# Patient Record
Sex: Female | Born: 1950 | Race: Black or African American | Hispanic: No | Marital: Single | Smoking: Former smoker
Health system: Southern US, Community
[De-identification: ages and names within clinical notes are randomized; demographics above are authoritative.]

## PROBLEM LIST (undated history)

## (undated) DIAGNOSIS — E119 Type 2 diabetes mellitus without complications: Secondary | ICD-10-CM

## (undated) DIAGNOSIS — Z972 Presence of dental prosthetic device (complete) (partial): Secondary | ICD-10-CM

## (undated) DIAGNOSIS — Z9989 Dependence on other enabling machines and devices: Principal | ICD-10-CM

## (undated) DIAGNOSIS — G8194 Hemiplegia, unspecified affecting left nondominant side: Secondary | ICD-10-CM

## (undated) DIAGNOSIS — I10 Essential (primary) hypertension: Secondary | ICD-10-CM

## (undated) DIAGNOSIS — R569 Unspecified convulsions: Secondary | ICD-10-CM

## (undated) DIAGNOSIS — I639 Cerebral infarction, unspecified: Secondary | ICD-10-CM

## (undated) DIAGNOSIS — E7800 Pure hypercholesterolemia, unspecified: Secondary | ICD-10-CM

## (undated) DIAGNOSIS — K219 Gastro-esophageal reflux disease without esophagitis: Secondary | ICD-10-CM

## (undated) DIAGNOSIS — G4733 Obstructive sleep apnea (adult) (pediatric): Secondary | ICD-10-CM

## (undated) DIAGNOSIS — Z993 Dependence on wheelchair: Secondary | ICD-10-CM

## (undated) DIAGNOSIS — R531 Weakness: Secondary | ICD-10-CM

## (undated) DIAGNOSIS — G629 Polyneuropathy, unspecified: Secondary | ICD-10-CM

## (undated) HISTORY — DX: Unspecified convulsions: R56.9

## (undated) HISTORY — DX: Weakness: R53.1

## (undated) HISTORY — DX: Pure hypercholesterolemia, unspecified: E78.00

## (undated) HISTORY — DX: Dependence on other enabling machines and devices: Z99.89

## (undated) HISTORY — DX: Obstructive sleep apnea (adult) (pediatric): G47.33

---

## 2005-01-24 ENCOUNTER — Ambulatory Visit: Payer: Self-pay

## 2012-08-12 DIAGNOSIS — I639 Cerebral infarction, unspecified: Secondary | ICD-10-CM

## 2012-08-12 HISTORY — DX: Cerebral infarction, unspecified: I63.9

## 2012-09-14 ENCOUNTER — Emergency Department: Payer: Self-pay | Admitting: Unknown Physician Specialty

## 2012-09-14 LAB — CK TOTAL AND CKMB (NOT AT ARMC)
CK, Total: 197 U/L (ref 21–215)
CK-MB: 1.6 ng/mL (ref 0.5–3.6)

## 2012-09-14 LAB — MAGNESIUM: Magnesium: 1.9 mg/dL

## 2012-09-14 LAB — COMPREHENSIVE METABOLIC PANEL
Albumin: 3.7 g/dL (ref 3.4–5.0)
Anion Gap: 7 (ref 7–16)
Bilirubin,Total: 0.3 mg/dL (ref 0.2–1.0)
Chloride: 105 mmol/L (ref 98–107)
Co2: 26 mmol/L (ref 21–32)
Creatinine: 1 mg/dL (ref 0.60–1.30)
EGFR (Non-African Amer.): >60
Osmolality: 279 (ref 275–301)
SGPT (ALT): 26 U/L (ref 12–78)
Sodium: 138 mmol/L (ref 136–145)
Total Protein: 6.8 g/dL (ref 6.4–8.2)

## 2012-09-14 LAB — CBC
HGB: 13.5 g/dL (ref 12.0–16.0)
MCV: 105 fL — ABNORMAL HIGH (ref 80–100)
Platelet: 160 10*3/uL (ref 150–440)

## 2012-09-14 LAB — TSH: Thyroid Stimulating Horm: 3.5 u[IU]/mL

## 2012-09-14 LAB — APTT: Activated PTT: 23 secs — ABNORMAL LOW (ref 23.6–35.9)

## 2012-09-15 ENCOUNTER — Inpatient Hospital Stay (HOSPITAL_COMMUNITY): Payer: BC Managed Care – PPO

## 2012-09-15 ENCOUNTER — Encounter (HOSPITAL_COMMUNITY): Payer: Self-pay | Admitting: *Deleted

## 2012-09-15 ENCOUNTER — Inpatient Hospital Stay (HOSPITAL_COMMUNITY)
Admission: AD | Admit: 2012-09-15 | Discharge: 2012-09-22 | DRG: 533 | Disposition: A | Discharge status: Rehabilitation Facility | Payer: BC Managed Care – PPO | Source: Other Acute Inpatient Hospital | Attending: Neurology | Admitting: Neurology

## 2012-09-15 DIAGNOSIS — R4701 Aphasia: Secondary | ICD-10-CM | POA: Diagnosis present

## 2012-09-15 DIAGNOSIS — K449 Diaphragmatic hernia without obstruction or gangrene: Secondary | ICD-10-CM | POA: Diagnosis present

## 2012-09-15 DIAGNOSIS — I10 Essential (primary) hypertension: Secondary | ICD-10-CM | POA: Diagnosis present

## 2012-09-15 DIAGNOSIS — G936 Cerebral edema: Secondary | ICD-10-CM

## 2012-09-15 DIAGNOSIS — Z9282 Status post administration of tPA (rtPA) in a different facility within the last 24 hours prior to admission to current facility: Secondary | ICD-10-CM

## 2012-09-15 DIAGNOSIS — I6340 Cerebral infarction due to embolism of unspecified cerebral artery: Principal | ICD-10-CM | POA: Diagnosis present

## 2012-09-15 DIAGNOSIS — I619 Nontraumatic intracerebral hemorrhage, unspecified: Secondary | ICD-10-CM | POA: Diagnosis present

## 2012-09-15 DIAGNOSIS — G8190 Hemiplegia, unspecified affecting unspecified side: Secondary | ICD-10-CM | POA: Diagnosis present

## 2012-09-15 DIAGNOSIS — R569 Unspecified convulsions: Secondary | ICD-10-CM | POA: Diagnosis present

## 2012-09-15 HISTORY — DX: Essential (primary) hypertension: I10

## 2012-09-15 HISTORY — DX: Cerebral infarction, unspecified: I63.9

## 2012-09-15 LAB — LIPID PANEL: Cholesterol: 152 mg/dL (ref 0–200)

## 2012-09-15 LAB — URINALYSIS, COMPLETE
Bilirubin,UR: NEGATIVE
Ph: 5 (ref 4.5–8.0)
WBC UR: 19 /HPF (ref 0–5)

## 2012-09-15 LAB — HEMOGLOBIN A1C
Hgb A1c MFr Bld: 5.3 % (ref <5.7)
Mean Plasma Glucose: 105 mg/dL (ref <117)

## 2012-09-15 MED ORDER — ACETAMINOPHEN 325 MG PO TABS
650.0000 mg | ORAL_TABLET | ORAL | Status: DC | PRN
  Administered 2012-09-16 – 2012-09-20 (×9): 650 mg via ORAL
  Filled 2012-09-15 (×9): qty 2

## 2012-09-15 MED ORDER — SODIUM CHLORIDE 0.9 % IV SOLN
500.0000 mg | Freq: Two times a day (BID) | INTRAVENOUS | Status: DC
  Administered 2012-09-16 – 2012-09-22 (×12): 500 mg via INTRAVENOUS
  Filled 2012-09-15 (×16): qty 5

## 2012-09-15 MED ORDER — ASPIRIN EC 325 MG PO TBEC
325.0000 mg | DELAYED_RELEASE_TABLET | Freq: Every day | ORAL | Status: DC
  Administered 2012-09-16 – 2012-09-21 (×6): 325 mg via ORAL
  Filled 2012-09-15 (×7): qty 1

## 2012-09-15 MED ORDER — ONDANSETRON HCL 4 MG/2ML IJ SOLN: 4.0000 mg | Freq: Four times a day (QID) | INTRAMUSCULAR | Status: DC | PRN

## 2012-09-15 MED ORDER — ASPIRIN 300 MG RE SUPP: 300.0000 mg | Freq: Once | RECTAL | Status: AC

## 2012-09-15 MED ORDER — ACETAMINOPHEN 650 MG RE SUPP: 650.0000 mg | RECTAL | Status: DC | PRN

## 2012-09-15 MED ORDER — PANTOPRAZOLE SODIUM 40 MG IV SOLR
40.0000 mg | Freq: Every day | INTRAVENOUS | Status: DC
  Administered 2012-09-15 – 2012-09-18 (×5): 40 mg via INTRAVENOUS
  Filled 2012-09-15 (×6): qty 40

## 2012-09-15 MED ORDER — SODIUM CHLORIDE 0.9 % IV SOLN
1000.0000 mg | Freq: Once | INTRAVENOUS | Status: AC
  Administered 2012-09-15: 1000 mg via INTRAVENOUS
  Filled 2012-09-15: qty 10

## 2012-09-15 MED ORDER — SODIUM CHLORIDE 0.9 % IV SOLN
INTRAVENOUS | Status: DC
  Administered 2012-09-15: 02:00:00 via INTRAVENOUS
  Administered 2012-09-16 – 2012-09-17 (×2): 75 mL/h via INTRAVENOUS
  Administered 2012-09-18 – 2012-09-19 (×2): via INTRAVENOUS
  Administered 2012-09-21: 1000 mL via INTRAVENOUS
  Administered 2012-09-21 – 2012-09-22 (×2): via INTRAVENOUS

## 2012-09-15 MED ORDER — SENNOSIDES-DOCUSATE SODIUM 8.6-50 MG PO TABS: 1.0000 | ORAL_TABLET | Freq: Every evening | ORAL | Status: DC | PRN

## 2012-09-15 MED ORDER — ASPIRIN 300 MG RE SUPP
300.0000 mg | Freq: Every day | RECTAL | Status: DC
  Filled 2012-09-15 (×7): qty 1

## 2012-09-15 MED ORDER — LABETALOL HCL 5 MG/ML IV SOLN: 10.0000 mg | INTRAVENOUS | Status: DC | PRN

## 2012-09-15 MED ORDER — ASPIRIN EC 325 MG PO TBEC
325.0000 mg | DELAYED_RELEASE_TABLET | Freq: Once | ORAL | Status: AC
  Administered 2012-09-15: 325 mg via ORAL
  Filled 2012-09-15: qty 1

## 2012-09-15 NOTE — Evaluation (Signed)
Clinical/Bedside Swallow Evaluation Patient Details  Name: Stephanie Mason MRN: [id number] Date of Birth: 03/07/1951  Today's Date: 09/15/2012 Time: 1530-1605 SLP Time Calculation (min): 35 min  Past Medical History:  Past Medical History  Diagnosis Date  . Stroke   . Hypertension    Past Surgical History: No past surgical history on file. HPI:  Stephanie Mason is an 62 y.o. female who was taken to [facility] after complaint that she was unable to move her right side. Seizure noted in ED and Ativan given. Patient was evaluated at [facility] (NIH stroke scale of 16 documented) and felt eligible for tPA. Patient was then transferred to [facility] for further management. Patient has a history of a stroke on the right side of the brain that occurred about 22 years ago. Patient had fully recovered from that event and received no speech therapy. Her current NIH is 15, left MCA suspected, image pending, workup underway.   Assessment / Plan / Recommendation Clinical Impression  Patient presents with a mild oral dysphagia characterized by left anterior spillage and mildy delayed oral transit of solids due to left side oral weakness. Patient does not present with overt s/s of aspiration, however due to extreme fatigue and oral weakness, SLP recommends dys-3 with thin liquids, full supervision, and strict use of aspiration precautions. SLP will f/u 2/5 for diet tolerance and completion of cognitive linguistic eval.    Aspiration Risk  Mild    Diet Recommendation Dysphagia 3 (Mechanical Soft);Thin liquid   Liquid Administration via: Cup;Straw Medication Administration: Whole meds with puree Supervision: Full supervision/cueing for compensatory strategies;Patient able to self feed Compensations: Slow rate;Small sips/bites;Check for pocketing;Check for anterior loss Postural Changes and/or Swallow Maneuvers: Seated upright 90 degrees;Upright 30-60 min after meal    Other   Recommendations Oral Care Recommendations: Oral care BID   Follow Up Recommendations  Inpatient Rehab    Frequency and Duration min 2x/week  2 weeks   Pertinent Vitals/Pain None reported    SLP Swallow Goals Patient will utilize recommended strategies during swallow to increase swallowing safety with: Supervision/safety Swallow Study Goal #2 - Progress: Other (comment) (new goal)   Swallow Study Prior Functional Status       General Date of Onset: 09/15/12 HPI: Stephanie Mason is an 62 y.o. female who was taken to [facility] after complaint that she was unable to move her right side. Seizure noted in ED and Ativan given. Patient was evaluated at [facility] (NIH stroke scale of 16 documented) and felt eligible for tPA. Patient was then transferred to [facility] for further management. Patient has a history of a stroke on the right side of the brain that occurred about 22 years ago. Patient had fully recovered from that event and received no speech therapy. Her current NIH is 15, left MCA suspected, image pending, workup underway. Type of Study: Bedside swallow evaluation Previous Swallow Assessment: none Diet Prior to this Study: NPO Temperature Spikes Noted: No Respiratory Status: Room air History of Recent Intubation: No Behavior/Cognition: Cooperative;Lethargic Oral Cavity - Dentition: Adequate natural dentition Self-Feeding Abilities: Needs assist (left-sided neglect, unable to move left arm) Patient Positioning: Upright in bed Baseline Vocal Quality: Clear;Low vocal intensity Volitional Cough: Weak Volitional Swallow: Able to elicit    Oral/Motor/Sensory Function Overall Oral Motor/Sensory Function: Impaired Labial ROM: Reduced left Labial Symmetry: Within Functional Limits Labial Strength: Reduced Labial Sensation: Reduced Lingual ROM: Reduced left Lingual Symmetry: Within Functional Limits Lingual Strength: Reduced (left) Lingual Sensation: Within  Functional  Limits Facial Sensation: Reduced (left side)   Ice Chips Ice chips: Within functional limits Presentation: Spoon;Self Fed (SLP held cup, but patient able to self-feed)   Thin Liquid Thin Liquid: Impaired Presentation: Cup;Self Fed;Straw Oral Phase Impairments: Reduced labial seal Oral Phase Functional Implications: Left anterior spillage    Nectar Thick Nectar Thick Liquid: Not tested   Honey Thick Honey Thick Liquid: Not tested   Puree Puree: Impaired Presentation: Self Fed;Spoon Oral Phase Functional Implications: Left anterior spillage   Solid   GO   Berdine Dance SLP student Solid: Impaired Presentation: Self Fed Oral Phase Impairments: Impaired anterior to posterior transit       Berdine Dance 09/15/2012,4:55 PM

## 2012-09-15 NOTE — Progress Notes (Addendum)
Stroke Team Progress Note  HISTORY Stephanie Mason is an 62 y.o. female who was in her normal state of health this evening 09/14/2012. She had something to drink and went to bed. Shortly after going to bed complained that she was unable to move her left side. Her husband attempted to help her and noted that she was unable to use it as well. EMS was called at that time and patient was brought to [facility]. Seizure noted in ED and Ativan given. Patient was evaluated there (NIH stroke scale of 16 documented) and felt eligible for tPA. Patient was then transferred here for further management. Patient has a history of a stroke on the right side of the brain that occurred about 22 years ago. Patient had fully recovered from that event. Current NIH of 15. She was admitted to the neuro ICU for further evaluation and treatment.  SUBJECTIVE Her son and daughter adn son-in-law are at the bedside.  Overall she feels her condition is unchanged.   OBJECTIVE Most recent Vital Signs: Filed Vitals:   09/15/12 0600 09/15/12 0630 09/15/12 0700 09/15/12 0800  BP: 106/74 119/68 124/70 119/68  Pulse: 67 70 65 63  Temp:      TempSrc:      Resp:    22  Height:      Weight:      SpO2: 100% 98% 97% 100%   CBG (last 3)  No results found for this basename: GLUCAP:3 in the last 72 hours  IV Fluid Intake:     . sodium chloride 75 mL/hr at 09/15/12 0800    MEDICATIONS    . levetiracetam  500 mg Intravenous Q12H  . pantoprazole (PROTONIX) IV  40 mg Intravenous QHS   PRN:  acetaminophen, acetaminophen, labetalol, ondansetron (ZOFRAN) IV, senna-docusate  Diet:  NPO  Activity:  Bedrest DVT Prophylaxis:  SCDs   CLINICALLY SIGNIFICANT STUDIES Basic Metabolic Panel: No results found for this basename: NA:2,K:2,CL:2,CO2:2,GLUCOSE:2,BUN:2,CREATININE:2,CALCIUM:2,MG:2,PHOS:2 in the last 168 hours Liver Function Tests: No results found for this basename: AST:2,ALT:2,ALKPHOS:2,BILITOT:2,PROT:2,ALBUMIN:2 in the  last 168 hours CBC: No results found for this basename: WBC:2,NEUTROABS:2,HGB:2,HCT:2,MCV:2,PLT:2 in the last 168 hours Coagulation: No results found for this basename: LABPROT:4,INR:4 in the last 168 hours Cardiac Enzymes: No results found for this basename: CKTOTAL:3,CKMB:3,CKMBINDEX:3,TROPONINI:3 in the last 168 hours Urinalysis: No results found for this basename: COLORURINE:2,APPERANCEUR:2,LABSPEC:2,PHURINE:2,GLUCOSEU:2,HGBUR:2,BILIRUBINUR:2,KETONESUR:2,PROTEINUR:2,UROBILINOGEN:2,NITRITE:2,LEUKOCYTESUR:2 in the last 168 hours Lipid Panel    Component Value Date/Time   CHOL 152 09/15/2012 0430   TRIG 63 09/15/2012 0430   HDL 52 09/15/2012 0430   CHOLHDL 2.9 09/15/2012 0430   VLDL 13 09/15/2012 0430   LDLCALC 87 09/15/2012 0430   HgbA1C  No results found for this basename: HGBA1C   Urine Drug Screen:   No results found for this basename: labopia, cocainscrnur, labbenz, amphetmu, thcu, labbarb    Alcohol Level: No results found for this basename: ETH:2 in the last 168 hours  No results found.  CT of the brain    MRI of the brain    MRA of the brain    2D Echocardiogram    Carotid Doppler    CXR    EKG  .   Therapy Recommendations   Physical Exam   Pleasant elderly african american lady not in distress.Awake alert. Afebrile. Head is nontraumatic. Neck is supple without bruit. Hearing is normal. Cardiac exam no murmur or gallop. Lungs are clear to auscultation. Distal pulses are well felt.  Neurological exam ; awake alert oriented x2. Mild expressive aphasia  with word finding difficulties and paraphasic as. Good comprehension. Poor naming and repetition. Right gaze preference but able to look past midline to the left on command. Diminished blink to threat on the left. Mild left lower facial weakness. Significant left upper and lower extremity drift with grade 1-2 strength only on the left side. Increased tone on the left. Normal strength on the right. Diminished sensation on the left.  Mild left sensory inattention. Left plantar upgoing. Right downgoing. Gait was not tested  ASSESSMENT Stephanie Mason is a 62 y.o. female presenting with left hemiparesis. Status post IV t-PA 09/14/2012 at 2348 at [facility]. She also had a seizure in their ED. Imaging pending. Right MCA stroke suspected.  On aspirin 81 mg orally every day prior to admission. Now on no antiplatelets as within 24h of tpa for secondary stroke prevention. Patient with resultant right gaze preference, left hemiparesis, hemisensory deficit. Work up underway.  New onset seizures, on Keppra Hypertension Stroke 22 years ago  Hospital day # 0  TREATMENT/PLAN  Add aspirin for secondary stroke prevention. First dose to be given tonight before and after 2348 to meet core measures  Check CT head 5-6p tonight in order to make decision related to antiplatelet prior to  Keep in ICU for post tPA monitoring  Continue BR for now  Check swallow  Continue Keppra  Strict control of hypertension as per post tpa protocol.  Annie Main, MSN, RN, ANVP-BC, ANP-BC, [facility] Pager: [phone] 09/15/2012 8:18 AM  I have personally obtained a history, examined the patient, evaluated imaging results, and formulated the assessment and plan of care. I agree with the above. This patient is critically ill and at significant risk of neurological worsening, death and care requires constant monitoring of vital signs, hemodynamics,respiratory and cardiac monitoring,review of multiple databases, neurological assessment, discussion with family, other specialists and medical decision making of high complexity. I spent 32 minutes of neurocritical care time  in the care of  this patient.   Delia Heady, MD Medical Director [facility] Pager: [phone] 09/15/2012 5:44 PM

## 2012-09-15 NOTE — Progress Notes (Signed)
EEG COMPLETED

## 2012-09-15 NOTE — H&P (Addendum)
Admission H&P    Chief Complaint: Left sided weakness  HPI: Stephanie Mason is an 62 y.o. female who was in her normal state of health this evening.  She had something to drink and went to bed.  Shortly after going to bed complained that she was unable to move her right side.  Her husband attempted to help her and noted that she was unable to use it as well.  EMS was called at that time and patient was brought to [facility].  Seizure noted in ED and Ativan given.  Patient was evaluated there (NIH stroke scale of 16 documented) and felt eligible for tPA.    Patient was then transferred here for further management.   Patient has a history of a stroke on the right side of the brain that occurred about 22 years ago.  Patient had fully recovered from that event.   Current NIH of 15.    LSN: [id number] tPA Given: Yes  Past medical history: Hypertension, stroke 22 years ago  Past surgical history: none  Family history:  Mother died of a stroke.  Father died of esophageal cancer.  She has two children. Her daughter has thyroid disease.  Her son is alive and well.    Social History:  does not have a smoking history on file. She does not have any smokeless tobacco history on file. Her alcohol and drug histories not on file.  Allergies: NKDA  Medications:  ASA, antihypertensive  ROS: History obtained from husband  General ROS: negative for - chills, fatigue, fever, night sweats, weight gain or weight loss Psychological ROS: negative for - behavioral disorder, hallucinations, memory difficulties, mood swings or suicidal ideation Ophthalmic ROS: negative for - blurry vision, double vision, eye pain or loss of vision ENT ROS: negative for - epistaxis, nasal discharge, oral lesions, sore throat, tinnitus or vertigo Allergy and Immunology ROS: negative for - hives or itchy/watery eyes Hematological and Lymphatic ROS: negative for - bleeding problems, bruising or swollen lymph nodes Endocrine ROS:  negative for - galactorrhea, hair pattern changes, polydipsia/polyuria or temperature intolerance Respiratory ROS: cough Cardiovascular ROS: negative for - chest pain, dyspnea on exertion, edema or irregular heartbeat Gastrointestinal ROS: negative for - abdominal pain, diarrhea, hematemesis, nausea/vomiting or stool incontinence Genito-Urinary ROS: negative for - dysuria, hematuria, incontinence or urinary frequency/urgency Musculoskeletal ROS: negative for - joint swelling or muscular weakness Neurological ROS: as noted in HPI Dermatological ROS: negative for rash and skin lesion changes  Physical Examination: BP- 111/70;  HR- 69;  RR- 19;  SpO2- 94%  General Examination: HEENT-  Normocephalic, no lesions, without obvious abnormality.  Normal external eye and conjunctiva.  Normal TM's bilaterally.  Normal auditory canals and external ears. Normal external nose, mucus membranes and septum.  Normal pharynx. Neck supple with no masses, nodes, nodules or enlargement. Cardiovascular - S1, S2 normal Lungs - chest clear, no wheezing, rales, normal symmetric air entry Abdomen - soft, non-tender; bowel sounds normal; no masses,  no organomegaly Extremities - no edema  Neurologic Examination: Mental Status: Lethargic.  No speech but does nod head to questioning at times.  Follows simple commands.  Cranial Nerves: II: Discs flat bilaterally; Does not blink to visual stimulation from the left.  Pupils equal, round, reactive to light and accommodation III,IV, VI: ptosis not present, Right gaze deviation.  With doll's eye maneuver patient doe snot go beyond midline to the left.   V,VII: left facial droop with decreased left corneal, facial light touch sensation normal  bilaterally VIII: hearing normal bilaterally IX,X: gag reflex reduced XI: bilateral shoulder shrug unable to be tested XII:  tongue extension unable to be tested Motor: Right : Upper extremity   5/5 (purposeful)    Left:     Upper  extremity   2-3/5  Lower extremity   5/5 (purposeful)     Lower extremity   1-2/5 Increased tone in the LUE Sensory: Responds to noxious stimuli throughout Deep Tendon Reflexes: 2+ with absent AJ's bilaterally Plantars: Right: downgoing   Left: upgoing Cerebellar: Unable to perform Gait: Unable to perform CV: pulses palpable throughout   Laboratory Studies:  Lab work reviewed from [employer] and no significant abnormalities noted.   Basic Metabolic Panel: No results found for this basename: NA:5,K:5,CL:5,CO2:5,GLUCOSE:5,BUN:5,CREATININE:5,CALCIUM:3,MG:5,PHOS:5 in the last 168 hours  Liver Function Tests: No results found for this basename: AST:5,ALT:5,ALKPHOS:5,BILITOT:5,PROT:5,ALBUMIN:5 in the last 168 hours No results found for this basename: LIPASE:5,AMYLASE:5 in the last 168 hours No results found for this basename: AMMONIA:3 in the last 168 hours  CBC: No results found for this basename: WBC:5,NEUTROABS:5,HGB:5,HCT:5,MCV:5,PLT:5 in the last 168 hours  Cardiac Enzymes: No results found for this basename: CKTOTAL:5,CKMB:5,CKMBINDEX:5,TROPONINI:5 in the last 168 hours  BNP: No components found with this basename: POCBNP:5  CBG: No results found for this basename: GLUCAP:5 in the last 168 hours  Microbiology: No results found for this or any previous visit.  Coagulation Studies: No results found for this basename: LABPROT:5,INR:5 in the last 72 hours  Urinalysis: No results found for this basename: COLORURINE:2,APPERANCEUR:2,LABSPEC:2,PHURINE:2,GLUCOSEU:2,HGBUR:2,BILIRUBINUR:2,KETONESUR:2,PROTEINUR:2,UROBILINOGEN:2,NITRITE:2,LEUKOCYTESUR:2 in the last 168 hours  Lipid Panel:  No results found for this basename: chol, trig, hdl, cholhdl, vldl, ldlcalc    HgbA1C:  No results found for this basename: HGBA1C    Urine Drug Screen:   No results found for this basename: labopia, cocainscrnur, labbenz, amphetmu, thcu, labbarb    Alcohol Level: No results found for this  basename: ETH:2 in the last 168 hours  EKG: NSR at 68bpm  Imaging: CT head ([place])-No acute changes.  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    Assessment: 62 y.o. female presenting with acute onset left sided weakness.  Patient with a history of stroke in the past related to childbirth.  Was previously on Coumadin until discontinued a couple of years ago and has since been on ASA.  CT shows no evidence of hemorrhage.  IV tPA administered.  Patient stable with NIHSS at [place] documented at 16.  Score 16 on arrival here as well.   Noted to have seizure in ED.    Stroke Risk Factors - hypertension  Plan: 1. HgbA1c, fasting lipid panel 2. MRI, MRA  of the brain without contrast 3. PT consult, OT consult, Speech consult 4. Echocardiogram 5. Carotid dopplers 6. Prophylactic therapy-None 7. Risk factor modification 8. Telemetry monitoring 9. Frequent neuro checks 10. EEG 11. Keppra 1000mg  IV now with 500mg  IV maintenance q12hours.   12.  Repeat head CT in 24 hours 13.  Seizure precautions  This patient is critically ill and at significant risk of neurological worsening, death and care requires constant monitoring of vital signs, hemodynamics,respiratory and cardiac monitoring, neurological assessment, discussion with family, other specialists and medical decision making of high complexity. I spent 90 minutes of neurocritical care time  in the care of  this patient.   Thana Farr, MD [facility] [phone] 09/15/2012, 12:50 AM

## 2012-09-15 NOTE — Progress Notes (Signed)
*  PRELIMINARY RESULTS* Vascular Ultrasound Carotid Duplex (Doppler) has been completed.  Preliminary findings: Bilateral:  No evidence of hemodynamically significant internal carotid artery stenosis.   Vertebral artery flow is antegrade.      Farrel Demark, RDMS, RVT 09/15/2012, 1:55 PM

## 2012-09-15 NOTE — Procedures (Signed)
EEG report.  Brief clinical history: 62  years old female admitted to the hospital with left sided weakness and a witnessed seizure in the ED.Marland Kitchen No prior history of frank epileptic seizures.  Technique: this is a 17 channel routine scalp EEG performed at the bedside with bipolar and monopolar montages arranged in accordance to the international 10/20 system of electrode placement. One channel was dedicated to EKG recording.  The study was performed predominantly during wakefulness.  No activating procedures employed during the test.  Description: The study is contaminated by significant amount of myogenic artifact, but in the wakeful state the best background consisted of a medium amplitude, posterior dominant, poorly sustained 9 Hz rhythm that is symmetric and reactive.  .   No activating procedures employed during the test. No focal or generalized epileptiform discharges noted.  No pathologic areas of slowing seen.  EKG showed sinus rhythm.  Impression: this is a normal awake EEG . Please, be aware that a normal EEG does not exclude the possibility of epilepsy.  Clinical correlation is advised.  Wyatt Portela, MD

## 2012-09-15 NOTE — Progress Notes (Signed)
*  PRELIMINARY RESULTS* Echocardiogram 2D Echocardiogram has been performed.  Stephanie Mason 09/15/2012, 2:15 PM

## 2012-09-15 NOTE — Progress Notes (Signed)
TPa completed. Maintaince IVF now infusing.

## 2012-09-15 NOTE — Progress Notes (Signed)
PT Cancellation Note  Patient Details Name: Stephanie Mason MRN: [id number] DOB: Aug 14, 1950   Cancelled Treatment:    Reason Eval/Treat Not Completed: Patient not medically ready (pt on strict bedrest)  Please update activity orders when appropriate.   Adelaine Roppolo 09/15/2012, 10:21 AM Jake Shark, PT DPT [phone]

## 2012-09-15 NOTE — Progress Notes (Signed)
UR COMPLETED  

## 2012-09-16 ENCOUNTER — Other Ambulatory Visit (HOSPITAL_COMMUNITY): Payer: Self-pay

## 2012-09-16 ENCOUNTER — Encounter (HOSPITAL_COMMUNITY): Payer: Self-pay | Admitting: Radiology

## 2012-09-16 ENCOUNTER — Inpatient Hospital Stay (HOSPITAL_COMMUNITY): Payer: BC Managed Care – PPO

## 2012-09-16 DIAGNOSIS — G936 Cerebral edema: Secondary | ICD-10-CM | POA: Diagnosis not present

## 2012-09-16 LAB — BASIC METABOLIC PANEL
CO2: 23 mEq/L (ref 19–32)
Calcium: 9.3 mg/dL (ref 8.4–10.5)
Creatinine, Ser: 0.76 mg/dL (ref 0.50–1.10)
GFR calc non Af Amer: >90 mL/min (ref >90)

## 2012-09-16 MED ORDER — IOHEXOL 300 MG/ML  SOLN
100.0000 mL | Freq: Once | INTRAMUSCULAR | Status: AC | PRN
  Administered 2012-09-16: 100 mL via INTRAVENOUS

## 2012-09-16 NOTE — Progress Notes (Signed)
Telephone call received from Dr. Andrey Campanile, Radiology in reference to recent MRI. Stroke noted with mass effect and presence of blood. Strongly discouraged use of any more blood thinners.

## 2012-09-16 NOTE — Progress Notes (Signed)
Speech Language Pathology Dysphagia Treatment Patient Details Name: Stephanie Mason MRN: [id number] DOB: [birth year] Today's Date: 09/16/2012 Time: 1540-1600 SLP Time Calculation (min): 20 min  Assessment / Plan / Recommendation Clinical Impression   Treatment focused on therapeutic po trials for possible diet upgrade. Skilled observation complete with clinician provided po trials. Patient and boyfriend able to verbalize understanding of compensatory strategies and patient able to utilize with po intake independently. Patient did report decrease in appetite, possibly due to pain in left arm? Patient did not present with overt s/s of aspiration at bedside and SLP recommends upgrade to regular diet with thin liquids. No f/u needed at this time for diet tolerance.   Diet Recommendation  Continue with Current Diet: Dysphagia 3 (mechanical soft);Thin liquid Initiate / Change Diet: Regular;Thin liquid    SLP Plan Discharge SLP treatment due to (comment) (d/c for dysphagia tx)   Pertinent Vitals/Pain Pain in left arm, reported to nurse   Swallowing Goals  SLP Swallowing Goals Patient will utilize recommended strategies during swallow to increase swallowing safety with: Supervision/safety Swallow Study Goal #2 - Progress: Met  General Temperature Spikes Noted: No Respiratory Status: Room air Behavior/Cognition: Cooperative;Lethargic (very fatigued) Oral Cavity - Dentition: Adequate natural dentition Patient Positioning: Upright in bed  Oral Cavity - Oral Hygiene     Dysphagia Treatment Treatment focused on: Skilled observation of diet tolerance;Upgraded PO texture trials;Patient/family/caregiver education;Utilization of compensatory strategies Family/Caregiver Educated: boyfriend Treatment Methods/Modalities: Skilled observation Patient observed directly with PO's: Yes Type of PO's observed: Dysphagia 3 (soft);Thin liquids Feeding: Able to feed self;Needs assist Liquids provided via:  Cup   GO   Berdine Dance SLP student  Berdine Dance 09/16/2012, 4:46 PM

## 2012-09-16 NOTE — Progress Notes (Signed)
TCD completed. 

## 2012-09-16 NOTE — Progress Notes (Signed)
PT/OT Cancellation Note   Treatment cancelled today due to patient receiving procedure or test (speech evaluation) and very lethargic per SLP report.  Was bedrest this AM.   Will check back next date.   09/16/2012 Cipriano Mile OTR/L Pager [phone] Office [phone]

## 2012-09-16 NOTE — Evaluation (Signed)
Speech Language Pathology Evaluation Patient Details Name: Stephanie Mason MRN: [id number] DOB: 09-Jun-1951 Today's Date: 09/16/2012 Time: 1540-1600 SLP Time Calculation (min): 20 min  Problem List:  Patient Active Problem List  Diagnosis  . Hemiplegia, unspecified, affecting nondominant side  . Cerebral embolism with cerebral infarction  . Other convulsions  . Cytotoxic cerebral edema   Past Medical History:  Past Medical History  Diagnosis Date  . Stroke   . Hypertension    Past Surgical History: History reviewed. No pertinent past surgical history. HPI:  Stephanie Mason is an 62 y.o. female who was taken to [facility] after complaint that she was unable to move her right side. Seizure noted in ED and Ativan given. Patient was evaluated at [facility] (NIH stroke scale of 16 documented) and felt eligible for tPA. Patient was then transferred to [facility] for further management. Patient has a history of a stroke on the right side of the brain that occurred about 22 years ago. Patient had fully recovered from that event and received no speech therapy. Neurology reports a large acute partially hemorrhagic right hemispheric infarct .    Assessment / Plan / Recommendation Clinical Impression  Patient presents with mild-moderate cognitive deficits. Patient was observed at bedside for evaluation of cognitive status, given the Cognistat by SLP. Patient presented with deficits in the areas of attention and possible memory? Patient was extremely fatigued during dx evaluation and exhibited a decreased LOA. Patient was able to provide appropriate responses on tasks in the evaluation, however patient had a difficult time statying alert/awake enough for therapist to continue. Therapist will f/u 2/6 at bedside for diagnostic treatment given patient exhibits and increase in LOA.    SLP Assessment  Patient needs continued Speech Lanaguage Pathology Services    Follow Up Recommendations  Inpatient Rehab    Frequency and Duration min 2x/week  2 weeks   Pertinent Vitals/Pain Pain in left arm, reported to nurse   SLP Goals   SLP Goals Potential to Achieve Goals: Good  SLP Evaluation Prior Functioning  Cognitive/Linguistic Baseline: Within functional limits   Cognition  Overall Cognitive Status: Impaired Arousal/Alertness: Lethargic Orientation Level: Oriented to person;Oriented to place;Oriented to situation;Disoriented to time Attention: Sustained (possibly due to extreme lethargy and discomfort in left arm) Sustained Attention: Impaired Sustained Attention Impairment: Verbal complex Memory: Impaired Memory Impairment: Storage deficit Awareness: Appears intact    Comprehension  Auditory Comprehension Overall Auditory Comprehension: Appears within functional limits for tasks assessed Yes/No Questions: Not tested Commands: Not tested Visual Recognition/Discrimination Discrimination: Not tested Reading Comprehension Reading Status: Not tested    Expression Expression Primary Mode of Expression: Verbal Verbal Expression Overall Verbal Expression: Appears within functional limits for tasks assessed Repetition: No impairment Naming: No impairment Pragmatics: No impairment Written Expression Written Expression: Not tested   Oral / Motor Oral Motor/Sensory Function Overall Oral Motor/Sensory Function: Appears within functional limits for tasks assessed Motor Speech Overall Motor Speech: Impaired Intelligibility: Intelligibility reduced Word: 75-100% accurate Phrase: 75-100% accurate Sentence: 75-100% accurate Conversation: 75-100% accurate Motor Planning: Not tested   GO   Berdine Dance SLP student  Berdine Dance 09/16/2012, 4:48 PM

## 2012-09-16 NOTE — Progress Notes (Signed)
New order received from Dr. Thad Ranger to give the 325 mg. Aspirin within ordered time frame 2348--midnight. Given at 2349.

## 2012-09-16 NOTE — Progress Notes (Addendum)
Stroke Team Progress Note  HISTORY Stephanie Mason is an 62 y.o. female who was in her normal state of health this evening 09/14/2012. She had something to drink and went to bed. Shortly after going to bed complained that she was unable to move her left side. Her husband attempted to help her and noted that she was unable to use it as well. EMS was called at that time and patient was brought to [facility]. Seizure noted in ED and Ativan given. Patient was evaluated there (NIH stroke scale of 16 documented) and felt eligible for tPA. Patient was then transferred here for further management. Patient has a history of a stroke on the right side of the brain that occurred about 22 years ago. Patient had fully recovered from that event. Current NIH of 15. She was admitted to the neuro ICU for further evaluation and treatment.  SUBJECTIVE No family at bedside. Patient sitting up in bed eating breakfast.  OBJECTIVE Most recent Vital Signs: Filed Vitals:   09/16/12 0300 09/16/12 0400 09/16/12 0500 09/16/12 0600  BP: 122/83 150/67 127/70 127/63  Pulse: 77 89 71 67  Temp:  98.2 F (36.8 C)    TempSrc:  Axillary    Resp: 26 25 26 26   Height:      Weight:    70.1 kg (154 lb 8.7 oz)  SpO2: 99% 99% 100% 98%   CBG (last 3)  No results found for this basename: GLUCAP:3 in the last 72 hours  IV Fluid Intake:     . sodium chloride 75 mL/hr at 09/16/12 0600   MEDICATIONS    . aspirin EC  325 mg Oral Daily   Or  . aspirin  300 mg Rectal Daily  . levetiracetam  500 mg Intravenous Q12H  . pantoprazole (PROTONIX) IV  40 mg Intravenous QHS   PRN:  acetaminophen, acetaminophen, labetalol, ondansetron (ZOFRAN) IV, senna-docusate  Diet:  Dysphagia 3 thin liquids Activity:  Bedrest DVT Prophylaxis:  SCDs   CLINICALLY SIGNIFICANT STUDIES Basic Metabolic Panel: No results found for this basename: NA:2,K:2,CL:2,CO2:2,GLUCOSE:2,BUN:2,CREATININE:2,CALCIUM:2,MG:2,PHOS:2 in the last 168 hours Liver  Function Tests: No results found for this basename: AST:2,ALT:2,ALKPHOS:2,BILITOT:2,PROT:2,ALBUMIN:2 in the last 168 hours CBC: No results found for this basename: WBC:2,NEUTROABS:2,HGB:2,HCT:2,MCV:2,PLT:2 in the last 168 hours Coagulation: No results found for this basename: LABPROT:4,INR:4 in the last 168 hours Cardiac Enzymes: No results found for this basename: CKTOTAL:3,CKMB:3,CKMBINDEX:3,TROPONINI:3 in the last 168 hours Urinalysis: No results found for this basename: COLORURINE:2,APPERANCEUR:2,LABSPEC:2,PHURINE:2,GLUCOSEU:2,HGBUR:2,BILIRUBINUR:2,KETONESUR:2,PROTEINUR:2,UROBILINOGEN:2,NITRITE:2,LEUKOCYTESUR:2 in the last 168 hours Lipid Panel    Component Value Date/Time   CHOL 152 09/15/2012 0430   TRIG 63 09/15/2012 0430   HDL 52 09/15/2012 0430   CHOLHDL 2.9 09/15/2012 0430   VLDL 13 09/15/2012 0430   LDLCALC 87 09/15/2012 0430   HgbA1C  Lab Results  Component Value Date   HGBA1C 5.3 09/15/2012   Urine Drug Screen:   No results found for this basename: labopia,  cocainscrnur,  labbenz,  amphetmu,  thcu,  labbarb    Alcohol Level: No results found for this basename: ETH:2 in the last 168 hours  CT of the brain  09/15/2012  Large right hemispheric acute/subacute infarct with mass effect and possible minimal petechial hemorrhage.  This finding appears to be superimposed upon a chronic right hemispheric infarct as indicated by prominence of the right sylvian fissure.  Compression of the frontal horn of the right lateral ventricle and 1.9 mm of midline shift to the left.   MRI of the brain 09/16/2012  Large acute partially hemorrhagic right hemispheric infarct is superimposed upon remote infarct as detailed above.  This causes mass effect upon the right lateral ventricle with midline shift to the left by 7.4 mm.  Cerebellar atrophy.  Paranasal sinus mucosal thickening most notable maxillary sinuses and greater on the left     MRA of the brain 09/16/2012    Motion degraded examination limits evaluation  as detailed above. What can be stated is that there is a decreased number of visualized right middle cerebral artery branch vessels consistent with the patient's acute and chronic infarct.   2D Echocardiogram  Normal LV function, no source of embolus.   Carotid Doppler  No evidence of hemodynamically significant internal carotid artery stenosis. Vertebral artery flow is antegrade.   TCD    CXR  09/15/2012   1.  No active lung disease. 2.  Rounded opacity at the left lung base may represent focal eventration, but a mass cannot be excluded.   CT Chest   EEG this is a normal awake EEG   Therapy Recommendations   Physical Exam   Pleasant elderly african american lady not in distress.Awake alert. Afebrile. Head is nontraumatic. Neck is supple without bruit. Hearing is normal. Cardiac exam no murmur or gallop. Lungs are clear to auscultation. Distal pulses are well felt.  Neurological exam ; awake alert oriented x2. Mild expressive aphasia with word finding difficulties and paraphasic errors.Peri Jefferson comprehension. Poor naming and repetition. Right gaze preference but able to look past midline to the left on command. Diminished blink to threat on the left. Mild left lower facial weakness. Significant left upper and lower extremity drift with grade 1-2 strength only on the left side. Increased tone on the left. Normal strength on the right. Diminished sensation on the left. Mild right sensory inattention. Left plantar upgoing. Right downgoing. Gait was not tested  .ASSESSMENT [name] is a 62 y.o. female presenting with left hemiparesis. Status post IV t-PA 09/14/2012 at 2348 at [facility]. She also had a seizure in their ED. Imaging confirms a large partially hemorrhagic right MCA infarct superimposed upon right brain remote infarct with cytotoxic cerebral edema and 7.24mm of midline shift. On aspirin 81 mg orally every day prior to admission. Now on aspirin daily for secondary stroke  prevention. Patient with resultant right gaze preference, left hemiparesis, hemisensory deficit. Work up underway.  New onset seizures, on Keppra Hypertension ? Lung mass per CXR Stroke 22 years ago ? Right terminal ICA occlusion, too much artifact on MRA to confirm Creatinine 1.0 at [place] on admission  Hospital day # 1  TREATMENT/PLAN  Continue aspirin 325 mg orally every day for secondary stroke prevention. Hemorrhage is not significant enough to hold.  OOB. Therapy evals  TCD to verify ICA occlusion  Continue Keppra  CT chest to rule out mass  BMP today, f/u K  CT head in am  Transfer to SDU  Annie Main, MSN, RN, ANVP-BC, ANP-BC, GNP-BC [facility] Pager: [phone] 09/16/2012 7:40 AM  I have personally obtained a history, examined the patient, evaluated imaging results, and formulated the assessment and plan of care. I agree with the above. This patient is critically ill and at significant risk of neurological worsening, death and care requires constant monitoring of vital signs, hemodynamics,respiratory and cardiac monitoring,review of multiple databases, neurological assessment, discussion with family, other specialists and medical decision making of high complexity. I spent 30 minutes of neurocritical care time  in the care of  this patient.   Delia Heady, MD Medical Director [facility] Pager: [phone] 09/16/2012 7:40 AM

## 2012-09-17 ENCOUNTER — Inpatient Hospital Stay (HOSPITAL_COMMUNITY): Payer: BC Managed Care – PPO

## 2012-09-17 ENCOUNTER — Ambulatory Visit (HOSPITAL_COMMUNITY): Payer: BC Managed Care – PPO

## 2012-09-17 ENCOUNTER — Encounter (HOSPITAL_COMMUNITY): Payer: Self-pay | Admitting: Radiology

## 2012-09-17 MED ORDER — ENOXAPARIN SODIUM 40 MG/0.4ML ~~LOC~~ SOLN
40.0000 mg | SUBCUTANEOUS | Status: DC
  Administered 2012-09-17 – 2012-09-21 (×5): 40 mg via SUBCUTANEOUS
  Filled 2012-09-17 (×6): qty 0.4

## 2012-09-17 MED ORDER — IRBESARTAN 150 MG PO TABS
150.0000 mg | ORAL_TABLET | Freq: Every day | ORAL | Status: DC
  Administered 2012-09-17 – 2012-09-21 (×5): 150 mg via ORAL
  Filled 2012-09-17 (×6): qty 1

## 2012-09-17 MED ORDER — TRAMADOL HCL 50 MG PO TABS
100.0000 mg | ORAL_TABLET | Freq: Four times a day (QID) | ORAL | Status: DC | PRN
  Administered 2012-09-17 – 2012-09-22 (×7): 100 mg via ORAL
  Filled 2012-09-17 (×8): qty 2

## 2012-09-17 NOTE — Evaluation (Signed)
Occupational Therapy Evaluation Patient Details Name: Stephanie Mason MRN: [id number] DOB: 03-22-1951 Today's Date: 09/17/2012 Time: 4098-1191 OT Time Calculation (min): 24 min  OT Assessment / Plan / Recommendation Clinical Impression  62 yo female s/p Rt frontal / temporal lobe infarct with 7 mm midline shift. Ot to follow acutely. Reocmmend CIR for d/c planning     OT Assessment  Patient needs continued OT Services    Follow Up Recommendations  CIR    Barriers to Discharge      Equipment Recommendations  3 in 1 bedside comode    Recommendations for Other Services Rehab consult  Frequency  Min 3X/week    Precautions / Restrictions Precautions Precautions: Fall Restrictions Weight Bearing Restrictions: No   Pertinent Vitals/Pain     ADL  Eating/Feeding: Set up (noticeable left side pocketing and only attending to Right ) Where Assessed - Eating/Feeding: Bed level Grooming: Moderate assistance Where Assessed - Grooming: Supported sitting Toilet Transfer: +2 Total assistance Toilet Transfer: Patient Percentage: 60% Acupuncturist: Regular height toilet Equipment Used: Gait belt Transfers/Ambulation Related to ADLs: Pt with Lt LE hyperextension during transfer. Pt required total (A) to advance Lt LE. Pt stepping with Rt LE. Pt's extensor tone assisting with static standing.  ADL Comments: Pt with decorticate posturing with Lt UE flexor tone and lt le extension. Pt lacks awareness to Lt UE positioning and high risk for injury. pt required (A) to reposition LT UE.Pt moving Rt UE with v/c. Pt with pain response at nail bed of Lt LE. Pt reports no numbness in Lt UE/ LE at this time. Pt attending to rt side and tracks left with mod v/c. pt eating half of breakfast and no awareness to left side of plate.     OT Diagnosis: Generalized weakness;Cognitive deficits;Hemiplegia non-dominant side  OT Problem List: Decreased strength;Decreased activity tolerance;Impaired  balance (sitting and/or standing);Decreased coordination;Decreased cognition;Decreased safety awareness;Decreased knowledge of use of DME or AE;Decreased knowledge of precautions;Impaired UE functional use OT Treatment Interventions: Self-care/ADL training;Neuromuscular education;DME and/or AE instruction;Therapeutic activities;Cognitive remediation/compensation;Balance training;Patient/family education   OT Goals Acute Rehab OT Goals OT Goal Formulation: With patient Time For Goal Achievement: 10/01/12 Potential to Achieve Goals: Good ADL Goals Pt Will Perform Grooming: with min assist;Sitting, chair;Supported ADL Goal: Grooming - Progress: Goal set today Pt Will Perform Upper Body Bathing: with min assist;Sitting, chair;Supported ADL Goal: Upper Body Bathing - Progress: Goal set today Pt Will Perform Upper Body Dressing: with min assist;Sitting, chair;Supported ADL Goal: Patent attorney - Progress: Goal set today Pt Will Transfer to Toilet: with max assist;Ambulation;3-in-1 ADL Goal: Toilet Transfer - Progress: Goal set today Miscellaneous OT Goals Miscellaneous OT Goal #1: Pt will locate 3 out 4 objects on left side with min v/c to demonstrate decr Lt inattention OT Goal: Miscellaneous Goal #1 - Progress: Goal set today  Visit Information  Last OT Received On: 09/17/12 Assistance Needed: +2    Subjective Data  Subjective: "no I dont have food there"- pt with noticable pocketing of food in left side of mouth Patient Stated Goal: none reported at this time to therapist   Prior Functioning     Home Living Lives With: Spouse Type of Home: House Prior Function Vocation:  (works at [employer] per SLP Tacey Ruiz [facility] health Care)) Dominant Hand: Right         Vision/Perception Vision - History Baseline Vision: No visual deficits Patient Visual Report: Other (comment) (Lt inattention observed with breakfast) Vision - Assessment Vision Assessment: Vision tested Ocular Range  of  Motion: Within Functional Limits Alignment/Gaze Preference: Gaze right;Head tilt Additional Comments: Pt attending to the right and will track left with verbal cues   Cognition  Cognition Overall Cognitive Status: Impaired Area of Impairment: Attention;Memory;Safety/judgement;Awareness of errors;Awareness of deficits;Problem solving Arousal/Alertness: Awake/alert Orientation Level: Appears intact for tasks assessed Behavior During Session: [facility] for tasks performed Current Attention Level: Selective Safety/Judgement: Decreased safety judgement for tasks assessed;Decreased awareness of safety precautions Awareness of Errors: Assistance required to identify errors made;Assistance required to correct errors made Awareness of Errors - Other Comments: Pt without awareness to objects on left side of tray with max questioning cues Problem Solving: Pt needs assistance to correct errors and recognition of deficits    Extremity/Trunk Assessment Right Upper Extremity Assessment RUE ROM/Strength/Tone: Within functional levels RUE Sensation: WFL - Light Touch RUE Coordination: WFL - gross/fine motor Left Upper Extremity Assessment LUE ROM/Strength/Tone: Deficits LUE ROM/Strength/Tone Deficits: Brunstrom II- flexor tone present LUE Sensation: Deficits LUE Coordination: Deficits Trunk Assessment Trunk Assessment: Normal     Mobility Bed Mobility Bed Mobility: Supine to Sit;Sitting - Scoot to Edge of Bed Supine to Sit: 1: +2 Total assist;HOB flat Supine to Sit: Patient Percentage: 30% Sitting - Scoot to Edge of Bed: 2: Max assist Details for Bed Mobility Assistance: Pt log rolled to the left due to extensor tone in Lt LE and flexor tone Lt UE. pt remained on left side for ~1 minute. PT reports discomfort on Left side. Pt sitting EOB with Min (A) to maintain Lt LE knee flexion. pt with Lt UE flaccid supinated,with Lt LE flexed Transfers Transfers: Sit to Stand;Stand to Sit Sit to Stand: 1: +2  Total assist;From bed Sit to Stand: Patient Percentage: 60% Stand to Sit: 1: +2 Total assist;To chair/3-in-1 Stand to Sit: Patient Percentage: 60% Details for Transfer Assistance: Lt LE extensor tone allowed for total +2 60 % transfer. pt unable to advance Lt LE. Pt with Left inattention observed throughout session.     Exercise     Balance Balance Balance Assessed: Yes Static Standing Balance Static Standing - Balance Support: No upper extremity supported;During functional activity Static Standing - Level of Assistance: 1: +2 Total assist   End of Session OT - End of Session Activity Tolerance: Patient tolerated treatment well Patient left: in chair;with call bell/phone within reach Nurse Communication: Mobility status;Precautions  GO     Lucile Shutters 09/17/2012, 2:07 PM Pager: [phone]

## 2012-09-17 NOTE — Progress Notes (Addendum)
Stroke Team Progress Note  HISTORY Stephanie Mason is an 62 y.o. female who was in her normal state of health this evening 09/14/2012. She had something to drink and went to bed. Shortly after going to bed complained that she was unable to move her left side. Her husband attempted to help her and noted that she was unable to use it as well. EMS was called at that time and patient was brought to [facility]. Seizure noted in ED and Ativan given. Patient was evaluated there (NIH stroke scale of 16 documented) and felt eligible for tPA. Patient was then transferred here for further management. Patient has a history of a stroke on the right side of the brain that occurred about 22 years ago. Patient had fully recovered from that event. Current NIH of 15. She was admitted to the neuro ICU for further evaluation and treatment.  SUBJECTIVE Remains in ICU. No step down bed available.repeat CT scan this a.m.shows persistent 7 mm right-to-left trans falcine brain herniation due to cytotoxic brain edema but no clinical change in her exam.  OBJECTIVE Most recent Vital Signs: Filed Vitals:   09/17/12 0500 09/17/12 0600 09/17/12 0630 09/17/12 0700  BP:   128/62   Pulse: 67 71 59 61  Temp:      TempSrc:      Resp: 21 17 18 19   Height:      Weight:      SpO2: 94% 95% 94% 95%   CBG (last 3)  No results found for this basename: GLUCAP:3 in the last 72 hours  IV Fluid Intake:    . sodium chloride 75 mL/hr at 09/17/12 0700   MEDICATIONS    . aspirin EC  325 mg Oral Daily   Or  . aspirin  300 mg Rectal Daily  . levetiracetam  500 mg Intravenous Q12H  . pantoprazole (PROTONIX) IV  40 mg Intravenous QHS   PRN:  acetaminophen, acetaminophen, labetalol, ondansetron (ZOFRAN) IV, senna-docusate  Diet:  Dysphagia 3 thin liquids Activity:  As tolerated DVT Prophylaxis:  SCDs   CLINICALLY SIGNIFICANT STUDIES Basic Metabolic Panel:   Lab 09/16/12 1024  NA 140  K 4.4  CL 105  CO2 23  GLUCOSE 99   BUN 10  CREATININE 0.76  CALCIUM 9.3  MG --  PHOS --   Liver Function Tests: No results found for this basename: AST:2,ALT:2,ALKPHOS:2,BILITOT:2,PROT:2,ALBUMIN:2 in the last 168 hours CBC: No results found for this basename: WBC:2,NEUTROABS:2,HGB:2,HCT:2,MCV:2,PLT:2 in the last 168 hours Coagulation: No results found for this basename: LABPROT:4,INR:4 in the last 168 hours Cardiac Enzymes: No results found for this basename: CKTOTAL:3,CKMB:3,CKMBINDEX:3,TROPONINI:3 in the last 168 hours Urinalysis: No results found for this basename: COLORURINE:2,APPERANCEUR:2,LABSPEC:2,PHURINE:2,GLUCOSEU:2,HGBUR:2,BILIRUBINUR:2,KETONESUR:2,PROTEINUR:2,UROBILINOGEN:2,NITRITE:2,LEUKOCYTESUR:2 in the last 168 hours  Lipid Panel    Component Value Date/Time   CHOL 152 09/15/2012 0430   TRIG 63 09/15/2012 0430   HDL 52 09/15/2012 0430   CHOLHDL 2.9 09/15/2012 0430   VLDL 13 09/15/2012 0430   LDLCALC 87 09/15/2012 0430   HgbA1C  Lab Results  Component Value Date   HGBA1C 5.3 09/15/2012   Urine Drug Screen:   No results found for this basename: labopia,  cocainscrnur,  labbenz,  amphetmu,  thcu,  labbarb    Alcohol Level: No results found for this basename: ETH:2 in the last 168 hours  CT of the brain   09/17/2012 Large area progressing infarct in the right frontal and temporal  lobes with a vague central hemorrhagic change better depicted on  MRI. There  is mass effect with increasing right to left midline  shift now approximately 7 mm.  09/15/2012  Large right hemispheric acute/subacute infarct with mass effect and possible minimal petechial hemorrhage.  This finding appears to be superimposed upon a chronic right hemispheric infarct as indicated by prominence of the right sylvian fissure.  Compression of the frontal horn of the right lateral ventricle and 1.9 mm of midline shift to the left.   MRI of the brain 09/16/2012  Large acute partially hemorrhagic right hemispheric infarct is superimposed upon remote infarct  as detailed above.  This causes mass effect upon the right lateral ventricle with midline shift to the left by 7.4 mm.  Cerebellar atrophy.  Paranasal sinus mucosal thickening most notable maxillary sinuses and greater on the left     MRA of the brain 09/16/2012    Motion degraded examination limits evaluation as detailed above. What can be stated is that there is a decreased number of visualized right middle cerebral artery branch vessels consistent with the patient's acute and chronic infarct.   2D Echocardiogram  Normal LV function, no source of embolus.   Carotid Doppler  No evidence of hemodynamically significant internal carotid artery stenosis. Vertebral artery flow is antegrade.   TCD    CXR  09/15/2012   1.  No active lung disease. 2.  Rounded opacity at the left lung base may represent focal eventration, but a mass cannot be excluded.   CT Chest 1. A fat containing Bochdalek hernia accounts for the questioned abnormality at the base of the left hemithorax on recent chest radiograph. 2. No acute findings.  EEG this is a normal awake EEG   Therapy Recommendations CIR  Physical Exam   Pleasant elderly african american lady not in distress.Awake alert. Afebrile. Head is nontraumatic. Neck is supple without bruit. Hearing is normal. Cardiac exam no murmur or gallop. Lungs are clear to auscultation. Distal pulses are well felt.  Neurological exam ; awake alert oriented x2. Mild expressive aphasia with word finding difficulties and paraphasic errors. Good comprehension. Poor naming and repetition. Right gaze preference but able to look past midline to the left on command. Diminished blink to threat on the left. Mild left lower facial weakness. Significant left upper and lower extremity drift with grade 1-2 strength only on the left side. Increased tone on the left. Normal strength on the right. Diminished sensation on the left. Mild left sensory inattention. Left plantar upgoing. Right downgoing.  Gait was not tested  .ASSESSMENT [name] is a 62 y.o. female presenting with left hemiparesis. Status post IV t-PA 09/14/2012 at 2348 at [facility]. She also had a seizure in their ED. Imaging confirms a large partially hemorrhagic right MCA infarct superimposed upon remote right brain infarct with cytotoxic cerebral edema and 7.9mm of midline shift. Remains at risk for neurologic worsening from cerebral edema. On aspirin 81 mg orally every day prior to admission. Now on aspirin daily for secondary stroke prevention. Patient with resultant right gaze preference, left hemiparesis, hemisensory deficit. Work up underway.  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 Right terminal ICA occlusion, too much artifact on MRA to confirm Creatinine 1.0 at [place] on admission LDL 87 HgbA1c 5.3  Hospital day # 2  TREATMENT/PLAN  Continue aspirin 325 mg orally every day for secondary stroke prevention. Hemorrhage is not significant enough to hold.  F/u TCD to verify ICA occlusion  Continue Keppra  Add lovenox for VTE prophylaxis  Transfer to SDU. Await bed  Annie Main, MSN, RN, ANVP-BC, ANP-BC, GNP-BC [facility] Pager: [phone] 09/17/2012 8:21 AM  I have personally obtained a history, examined the patient, evaluated imaging results, and formulated the assessment and plan of care. I agree with the above.   Delia Heady, MD Medical Director [facility] Pager: [phone] 09/17/2012 8:21 AM

## 2012-09-17 NOTE — Progress Notes (Signed)
Speech Language Pathology Treatment: Dysphagia, Cognition Patient Details Name: Stephanie Mason MRN: [id number] DOB: 02-07-1951 Today's Date: 09/17/2012 Time: 9528-4132 SLP Time Calculation (min): 25 min  Assessment / Plan / Recommendation Clinical Impression  Treatment focused on facilitation of cognitive-linguistic recovery as well as use of compensatory strategies for safe swallowing. Reviewed chart and noted that SLP signed off on patient 2/5 recommending a regular diet, thin liquid however per OT, patient with significant pocketing today. SLP provided skilled clinical observation with am meal. Mild-moderate buccal (left) pocketing of solids noted, requring moderate verbal cues to clear with use of lingual sweep and liquid wash. No overt s/s of aspiration noted. Additonally, SLP provided moderate-max verbal cueing for use of compensatory strategies for speech intelligibility including increased volume and overarticulation at the conversation level. Moderate verbal cueing also required  for attention to left visual field during a functional and familiar ADL. Patient would benefit from continued SLP f/u  for cognition as well as resuming dysphagia goals, maintaing a dysphagia 3 diet, to facilitate increased safety of swallow.     SLP Plan  Continue with current plan of care (resume dysphagia goals discharged 2/5. )    Pertinent Vitals/Pain None reported  SLP Goals  SLP Goals Potential to Achieve Goals: Good Progress/Goals/Alternative treatment plan discussed with pt/caregiver and they: Agree  General Temperature Spikes Noted: No Respiratory Status: Room air Behavior/Cognition: Alert;Cooperative;Pleasant mood Oral Cavity - Dentition: Adequate natural dentition Patient Positioning: Upright in chair  Oral Cavity - Oral Hygiene Does patient have any of the following "at risk" factors?: None of the above Brush patient's teeth BID with toothbrush (using toothpaste with fluoride): Yes    Treatment Treatment focused on: Cognition;Dysarthria;Patient/family/caregiver education (dysphagia) Skilled Treatment: Treatment focused on facilitation of cognitive-linguistic recovery as well as use of compensatory strategies for safe swallowing. Reviewed chart and noted that SLP signed off on patient 2/5 recommending a regular diet, thin liquid however per OT, patient with significant pocketing today. SLP provided skilled clinical observation with am meal. Mild-moderate buccal (left) pocketing of solids noted, requring moderate verbal cues to clear with use of lingual sweep and liquid wash. No overt s/s of aspiration noted. Additonally, SLP provided moderate-max verbal cueing for use of compensatory strategies for speech intelligibility including increased volume and overarticulation at the conversation level. Moderate verbal cueing also required  for attention to left visual field during a functional and familiar ADL. Patient would benefit from continued SLP f/u  for cognition as well as resuming dysphagia goals, maintaing a dysphagia 3 diet, to facilitate increased safety of swallow.    GO    Ferdinand Lango MA, CCC-SLP [phone]  Stephanie Mason Stephanie Mason 09/17/2012, 10:18 AM

## 2012-09-17 NOTE — Evaluation (Signed)
Physical Therapy Evaluation Patient Details Name: Stephanie Mason MRN: [id number] DOB: Oct 13, 1950 Today's Date: 09/17/2012 Time: 3086-5784 PT Time Calculation (min): 24 min  PT Assessment / Plan / Recommendation Clinical Impression  Pt is   62 yo female s/p Rt frontal / temporal lobe infarct with 7 mm midline shift.   Noticeable left sided inattention however able to pass midline with cues.  Pt will benefit from acute PT services to improve overall mobility and prepare for safe d/c to next venue.    PT Assessment  Patient needs continued PT services    Follow Up Recommendations  CIR    Barriers to Discharge None      Recommendations for Other Services Rehab consult   Frequency Min 4X/week    Precautions / Restrictions Precautions Precautions: Fall Restrictions Weight Bearing Restrictions: No   Pertinent Vitals/Pain No c/o pain      Mobility  Bed Mobility Bed Mobility: Supine to Sit;Sitting - Scoot to Edge of Bed Supine to Sit: 1: +2 Total assist;HOB flat Supine to Sit: Patient Percentage: 30% Sitting - Scoot to Edge of Bed: 2: Max assist Details for Bed Mobility Assistance: Pt log rolled to the left due to extensor tone in Lt LE and flexor tone Lt UE. pt remained on left side for ~1 minute. PT reports discomfort on Left side. Pt sitting EOB with Min (A) to maintain Lt LE knee flexion. pt with Lt UE flaccid supinated,with Lt LE flexed Transfers Transfers: Sit to Stand;Stand to Sit Sit to Stand: 1: +2 Total assist;From bed Sit to Stand: Patient Percentage: 60% Stand to Sit: 1: +2 Total assist;To chair/3-in-1 Stand to Sit: Patient Percentage: 60% Details for Transfer Assistance: Lt LE extensor tone allowed for total +2 60 % transfer. pt unable to advance Lt LE. Pt with Left inattention observed throughout session. Ambulation/Gait Ambulation/Gait Assistance: Not tested (comment)     PT Diagnosis: Difficulty walking;Abnormality of gait  PT Problem List: Decreased  strength;Decreased activity tolerance;Decreased balance;Decreased mobility;Decreased knowledge of use of DME;Decreased safety awareness PT Treatment Interventions: DME instruction;Gait training;Functional mobility training;Therapeutic activities;Therapeutic exercise;Balance training;Neuromuscular re-education;Cognitive remediation;Patient/family education   PT Goals Acute Rehab PT Goals PT Goal Formulation: With patient Time For Goal Achievement: 10/01/12 Potential to Achieve Goals: Good Pt will go Supine/Side to Sit: with min assist PT Goal: Supine/Side to Sit - Progress: Goal set today Pt will go Sit to Supine/Side: with min assist PT Goal: Sit to Supine/Side - Progress: Goal set today Pt will go Sit to Stand: with min assist PT Goal: Sit to Stand - Progress: Goal set today Pt will go Stand to Sit: with min assist PT Goal: Stand to Sit - Progress: Goal set today Pt will Transfer Bed to Chair/Chair to Bed: with mod assist PT Transfer Goal: Bed to Chair/Chair to Bed - Progress: Goal set today Pt will Stand: 1 - 2 min;with mod assist PT Goal: Stand - Progress: Goal set today Pt will Ambulate: 1 - 15 feet;with +2 total assist;with least restrictive assistive device PT Goal: Ambulate - Progress: Goal set today  Visit Information  Last PT Received On: 09/17/12 Assistance Needed: +2 PT/OT Co-Evaluation/Treatment: Yes    Subjective Data  Subjective: "No I don't see the grits on my plate." (pt only at half plate of food unable to recognize left side) Patient Stated Goal: To go home   Prior Functioning  Home Living Lives With: Spouse Type of Home: House Prior Function Vocation:  (works at [employer] per SLP [facility])) Dominant  Hand: Right    Cognition  Cognition Overall Cognitive Status: Impaired Area of Impairment: Attention;Memory;Safety/judgement;Awareness of errors;Awareness of deficits;Problem solving Arousal/Alertness: Awake/alert Orientation Level: Appears  intact for tasks assessed Behavior During Session: [facility] for tasks performed Current Attention Level: Selective Safety/Judgement: Decreased safety judgement for tasks assessed;Decreased awareness of safety precautions Awareness of Errors: Assistance required to identify errors made;Assistance required to correct errors made Awareness of Errors - Other Comments: Pt without awareness to objects on left side of tray with max questioning cues Problem Solving: Pt needs assistance to correct errors and recognition of deficits    Extremity/Trunk Assessment Right Upper Extremity Assessment RUE ROM/Strength/Tone: Within functional levels RUE Sensation: WFL - Light Touch RUE Coordination: WFL - gross/fine motor Left Upper Extremity Assessment LUE ROM/Strength/Tone: Deficits LUE ROM/Strength/Tone Deficits: Brunstrom II- flexor tone present LUE Sensation: Deficits LUE Coordination: Deficits Right Lower Extremity Assessment RLE ROM/Strength/Tone: Within functional levels RLE Sensation: WFL - Light Touch RLE Coordination: WFL - gross/fine motor Left Lower Extremity Assessment LLE ROM/Strength/Tone: Deficits;Unable to fully assess LLE ROM/Strength/Tone Deficits: Unable to fully assess due significant tone. LE extensor tone - Ashworth 2/4 LLE Sensation: Deficits LLE Coordination: Deficits Trunk Assessment Trunk Assessment: Normal   Balance Balance Balance Assessed: Yes Static Standing Balance Static Standing - Balance Support: No upper extremity supported;During functional activity Static Standing - Level of Assistance: 1: +2 Total assist  End of Session PT - End of Session Equipment Utilized During Treatment: Gait belt Activity Tolerance: Patient tolerated treatment well Patient left: in chair;with call bell/phone within reach Nurse Communication: Mobility status  GP     Stephanie Mason 09/17/2012, 2:56 PM Jake Shark, PT DPT [phone]

## 2012-09-17 NOTE — Progress Notes (Signed)
Received patient from 3100.  C/O rt shoulder pain, no other complaints. Medicated with ultram. Resting on lt side.

## 2012-09-17 NOTE — Progress Notes (Signed)
SLP reviewed and agree with student findings.   Anuradha Chabot MA, CCC-SLP [phone]    

## 2012-09-17 NOTE — Progress Notes (Signed)
Agree with PT/OT cancellation.  [place], Alden DPT [phone]

## 2012-09-17 NOTE — Evaluation (Signed)
SLP reviewed and agree with student findings.   Aerika Groll MA, CCC-SLP [phone]    

## 2012-09-17 NOTE — Progress Notes (Signed)
Rehab Admissions Coordinator Note:  Patient was screened by Roseanna Rainbow  for appropriateness for an Inpatient Acute Rehab Consult.  At this time, we are recommending Inpatient Rehab consult.  [name] Dare 09/17/2012, 3:38 PM  I can be reached at [phone]

## 2012-09-17 NOTE — Evaluation (Signed)
SLP reviewed and agree with student findings.   Baleigh Rennaker MA, CCC-SLP [phone]    

## 2012-09-18 DIAGNOSIS — R569 Unspecified convulsions: Secondary | ICD-10-CM

## 2012-09-18 DIAGNOSIS — I6330 Cerebral infarction due to thrombosis of unspecified cerebral artery: Secondary | ICD-10-CM

## 2012-09-18 MED ORDER — SALINE SPRAY 0.65 % NA SOLN
1.0000 | Freq: Two times a day (BID) | NASAL | Status: DC | PRN
  Administered 2012-09-18: 1 via NASAL
  Filled 2012-09-18: qty 44

## 2012-09-18 NOTE — Consult Note (Signed)
Physical Medicine and Rehabilitation Consult Reason for Consult: CVA Referring Physician: Dr. Pearlean Brownie   HPI: Stephanie Mason is a 62 y.o. 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 Admitted 09/15/2012 to [facility] with left-sided weakness. Patient with noted seizure in the ED and received Ativan as well as loaded with Keppra. MRI of the brain showed large acute partially hemorrhagic right hemispheric infarct superimposed upon remote infarct. Patient did receive TPA. Echocardiogram with normal left ventricular function no source of embolus. EEG showed no seizure activity. Carotid Dopplers with no ICA stenosis. Patient was transferred to [facility] for ongoing evaluation. Placed on aspirin therapy for stroke prophylaxis as well as subcutaneous Lovenox for DVT prophylaxis. Maintained on a dysphagia 3 thin liquid diet. Chest x-ray with questionable mass that was ruled out with CT of the chest. Physical and occupational therapy evaluations completed 09/17/2012 with recommendations of physical medicine rehabilitation consult to consider inpatient rehabilitation services   Review of Systems  Neurological: Positive for dizziness and seizures.  All other systems reviewed and are negative.   Past Medical History  Diagnosis Date  . Stroke   . Hypertension    History reviewed. No pertinent past surgical history. History reviewed. No pertinent family history. Social History:  reports that she has never smoked. She does not have any smokeless tobacco history on file. She reports that she does not drink alcohol or use illicit drugs. Allergies: No Known Allergies Medications Prior to Admission  Medication Sig Dispense Refill  . telmisartan (MICARDIS) 40 MG tablet Take 40 mg by mouth daily.        Home: Home Living Lives With: Spouse Type of Home: House  Functional History: Prior Function Vocation:  (works at [employer] per SLP Tacey Ruiz  [facility])) Functional Status:  Mobility: Bed Mobility Bed Mobility: Supine to Sit;Sitting - Scoot to Edge of Bed Supine to Sit: 1: +2 Total assist;HOB flat Supine to Sit: Patient Percentage: 30% Sitting - Scoot to Edge of Bed: 2: Max assist Transfers Transfers: Sit to Stand;Stand to [employer] to Stand: 1: +2 Total assist;From bed Sit to Stand: Patient Percentage: 60% Stand to Sit: 1: +2 Total assist;To chair/3-in-1 Stand to Sit: Patient Percentage: 60% Ambulation/Gait Ambulation/Gait Assistance: Not tested (comment)    ADL: ADL Eating/Feeding: Set up (noticeable left side pocketing and only attending to Right ) Where Assessed - Eating/Feeding: Bed level Grooming: Moderate assistance Where Assessed - Grooming: Supported sitting Toilet Transfer: +2 Total assistance Toilet Transfer Equipment: Regular height toilet Equipment Used: Gait belt Transfers/Ambulation Related to ADLs: Pt with Lt LE hyperextension during transfer. Pt required total (A) to advance Lt LE. Pt stepping with Rt LE. Pt's extensor tone assisting with static standing.  ADL Comments: Pt with decorticate posturing with Lt UE flexor tone and lt le extension. Pt lacks awareness to Lt UE positioning and high risk for injury. pt required (A) to reposition LT UE.Pt moving Rt UE with v/c. Pt with pain response at nail bed of Lt LE. Pt reports no numbness in Lt UE/ LE at this time. Pt attending to rt side and tracks left with mod v/c. pt eating half of breakfast and no awareness to left side of plate.   Cognition: Cognition Overall Cognitive Status: Impaired Arousal/Alertness: Awake/alert Orientation Level: Oriented X4 Attention: Sustained (possibly due to extreme lethargy and discomfort in left arm) Sustained Attention: Impaired Sustained Attention Impairment: Verbal complex Memory: Impaired Memory Impairment: Storage deficit Awareness: Appears intact Cognition  Overall Cognitive Status: Impaired Area of  Impairment: Attention;Memory;Safety/judgement;Awareness of errors;Awareness of deficits;Problem solving Arousal/Alertness: Awake/alert Orientation Level: Appears intact for tasks assessed Behavior During Session: [facility] for tasks performed Current Attention Level: Selective Safety/Judgement: Decreased safety judgement for tasks assessed;Decreased awareness of safety precautions Awareness of Errors: Assistance required to identify errors made;Assistance required to correct errors made Awareness of Errors - Other Comments: Pt without awareness to objects on left side of tray with max questioning cues Problem Solving: Pt needs assistance to correct errors and recognition of deficits  Blood pressure 132/82, pulse 54, temperature 98 F (36.7 C), temperature source Oral, resp. rate 17, height 5\' 7"  (1.702 m), weight 69.3 kg (152 lb 12.5 oz), SpO2 94.00%. Physical Exam  Vitals reviewed. Constitutional: She appears well-developed and well-nourished.  HENT:  Head: Normocephalic and atraumatic.  Right Ear: External ear normal.  Left Ear: External ear normal.  Eyes: Conjunctivae normal are normal.       Pupils reactive to light  Neck: Normal range of motion. Neck supple. No JVD present. No tracheal deviation present. No thyromegaly present.  Cardiovascular: Normal rate and regular rhythm.   Pulmonary/Chest: Effort normal and breath sounds normal. No respiratory distress.  Abdominal: Soft. Bowel sounds are normal. She exhibits no distension. There is no tenderness.  Musculoskeletal: She exhibits no edema.  Lymphadenopathy:    She has no cervical adenopathy.  Neurological: She is alert.       Flat affect. Patient with right gaze preference. She follows simple commands. Names person, place and date of birth. 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 Left facial droop. Speech slurred. Appears fatigued Sensation present but diminished to PP and LT on the left. LUE 0/5. Trace to 0/5 LLE. Extensor tone noted. Toes  up. DTR's 3+.   Skin: Skin is warm and dry.    No results found for this or any previous visit (from the past 24 hour(s)). Ct Head Wo Contrast  09/17/2012  *RADIOLOGY REPORT*  Clinical Data: Follow-up stroke.  CT HEAD WITHOUT CONTRAST  Technique:  Contiguous axial images were obtained from the base of the skull through the vertex without contrast.  Comparison: CT head 05/15/2013.  MRI brain 09/15/2012.  Findings: Large low attenuation area in the right frontal temporal region encompassing vascular territories of the anterior and middle cerebral arteries.  There is associated mass effect with effacement of sulci and lateral ventricles.  Increased right to left midline shift now measuring about 7 mm.  A vague central area of increased density corresponds with parenchymal hemorrhage noted on MRI.  No subarachnoid or intraventricular hemorrhage.  No abnormal extra- axial fluid collections.  Membrane thickening of the left paranasal sinuses.  IMPRESSION: Large area progressing infarct in the right frontal and temporal lobes with a vague central hemorrhagic change better depicted on MRI.  There is mass effect with increasing right to left midline shift now approximately 7 mm.   Original Report Authenticated By: Burman Nieves, M.D.    Ct Chest W Contrast  09/16/2012  *RADIOLOGY REPORT*  Clinical Data: Left lung base opacity on chest radiograph.  Cough.  CT CHEST WITH CONTRAST  Technique:  Multidetector CT imaging of the chest was performed following the standard protocol during bolus administration of intravenous contrast.  Contrast: OMNIPAQUE IOHEXOL 300 MG/ML  SOLN  Comparison: Chest radiograph 09/15/2012.  Findings: No pathologically enlarged mediastinal, hilar or axillary lymph nodes.  Heart is at the upper limits of normal in size.  No pericardial effusion.  Small hiatal  hernia.  Motion degrades image quality.  Minimal dependent atelectasis bilaterally.  Lungs are otherwise grossly clear.  No pleural  fluid. Airway is unremarkable.  A fat-containing left-sided Bochdalek hernia accounts for the questioned abnormality on recent chest radiograph.  Incidental imaging of the upper abdomen otherwise shows low attenuation throughout the visualized portion of the liver.  No worrisome lytic or sclerotic lesions.  IMPRESSION:  1.  A fat containing Bochdalek hernia accounts for the questioned abnormality at the base of the left hemithorax on recent chest radiograph. 2.  No acute findings.   Original Report Authenticated By: Leanna Battles, M.D.     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 Does the need for close, 24 hr/day medical supervision in concert with the patient's rehab needs make it unreasonable for this patient to be served in a less intensive setting? Yes 2. Co-Morbidities requiring supervision/potential complications: htn, seizures 3. Due to bladder management, bowel management, safety, skin/wound care, disease management, medication administration, pain management and patient education, does the patient require 24 hr/day rehab nursing? Yes 4. Does the patient require coordinated care of a physician, rehab nurse, PT (1-2 hrs/day, 5 days/week), OT (1-2 hrs/day, 5 days/week) and SLP (1-2 hrs/day, 5 days/week) to address physical and functional deficits in the context of the above medical diagnosis(es)? Yes Addressing deficits in the following areas: balance, endurance, locomotion, strength, transferring, bowel/bladder control, bathing, dressing, feeding, grooming, toileting, cognition, speech, language, swallowing and psychosocial support 5. Can the patient actively participate in an intensive therapy program of at least 3 hrs of therapy per day at least 5 days per week? Potentially 6. The potential for patient to make measurable gains while on inpatient rehab is excellent 7. Anticipated functional outcomes upon discharge  from inpatient rehab are min to mod assist with PT, mod assist with OT, supervision to min assist with SLP. 8. Estimated rehab length of stay to reach the above functional goals is: 3-4 weeks 9. Does the patient have adequate social supports to accommodate these discharge functional goals? Yes and Potentially 10. Anticipated D/C setting: Home 11. Anticipated post D/C treatments: HH therapy 12. Overall Rehab/Functional Prognosis: good  RECOMMENDATIONS: This patient's condition is appropriate for continued rehabilitative care in the following setting: CIR Patient has agreed to participate in recommended program. Yes Note that insurance prior authorization may be required for reimbursement for recommended care.  Comment:Rehab RN to follow up.   Ivory Broad, MD     09/18/2012

## 2012-09-18 NOTE — Progress Notes (Signed)
Physical Therapy Treatment Patient Details Name: Stephanie Mason MRN: [id number] DOB: May 24, 1951 Today's Date: 09/18/2012 Time: 4696-2952 PT Time Calculation (min): 23 min  PT Assessment / Plan / Recommendation Comments on Treatment Session  Pt with noticeable less tone in left UE however continues to have tone in left LE.  Tone present during standing and broken prior to bed mobility.  Pt will continue to benefit from inpatient rehab to improve overall mobility.    Follow Up Recommendations  CIR     Equipment Recommendations   (left platform RW)    Recommendations for Other Services Rehab consult  Frequency Min 4X/week   Plan Discharge plan remains appropriate;Frequency remains appropriate    Precautions / Restrictions Precautions Precautions: Fall Restrictions Weight Bearing Restrictions: No   Pertinent Vitals/Pain No c/o pain; c/o neck discomfort    Mobility  Bed Mobility Bed Mobility: Supine to Sit;Sitting - Scoot to Edge of Bed Supine to Sit: 1: +2 Total assist;HOB flat Supine to Sit: Patient Percentage: 40% Sitting - Scoot to Edge of Bed: 2: Max assist Details for Bed Mobility Assistance: (A) to elevate trunk OOB with cues for technique.  Pt rolled to right side with improved overall mobility however PT flexed bilateral knees to break left LE extensor tone. Transfers Transfers: Sit to Stand;Stand to Sit Sit to Stand: 1: +2 Total assist;From bed Sit to Stand: Patient Percentage: 60% Stand to Sit: 1: +2 Total assist;To chair/3-in-1 Stand to Sit: Patient Percentage: 60% Stand Pivot Transfers: 1: +2 Total assist Stand Pivot Transfers: Patient Percentage: 50% Details for Transfer Assistance: (A) to initiate transfer with cues for hand placement.  Pt with Left LE extensor tone assisting with transfers.  Pt with occasional left knee internal rotation during transfer and weight shift to left side. Ambulation/Gait Ambulation/Gait Assistance: Not tested (comment)     PT  Diagnosis:    PT Problem List:   PT Treatment Interventions:     PT Goals Acute Rehab PT Goals PT Goal Formulation: With patient Time For Goal Achievement: 10/01/12 Potential to Achieve Goals: Good Pt will go Supine/Side to Sit: with min assist PT Goal: Supine/Side to Sit - Progress: Progressing toward goal Pt will go Sit to Supine/Side: with min assist PT Goal: Sit to Supine/Side - Progress: Progressing toward goal Pt will go Sit to Stand: with min assist PT Goal: Sit to Stand - Progress: Progressing toward goal Pt will go Stand to Sit: with min assist PT Goal: Stand to Sit - Progress: Progressing toward goal Pt will Transfer Bed to Chair/Chair to Bed: with mod assist PT Transfer Goal: Bed to Chair/Chair to Bed - Progress: Progressing toward goal Pt will Stand: 1 - 2 min;with mod assist PT Goal: Stand - Progress: Progressing toward goal  Visit Information  Last PT Received On: 09/18/12 Assistance Needed: +2    Subjective Data  Subjective: "My neck still feels stiff." Patient Stated Goal: To go home   Cognition  Cognition Overall Cognitive Status: Impaired Area of Impairment: Attention;Memory;Safety/judgement;Awareness of errors;Awareness of deficits;Problem solving Arousal/Alertness: Awake/alert Orientation Level: Appears intact for tasks assessed Behavior During Session: [facility] for tasks performed Current Attention Level: Selective Safety/Judgement: Decreased safety judgement for tasks assessed;Decreased awareness of safety precautions Awareness of Errors: Assistance required to identify errors made;Assistance required to correct errors made Awareness of Errors - Other Comments: Pt without awareness to objects on left side of tray with max questioning cues Problem Solving: Pt needs assistance to correct errors and recognition of deficits    Balance  Balance Balance Assessed: Yes Static Sitting Balance Static Sitting - Balance Support: Feet supported Static Sitting -  Level of Assistance: 2: Max assist;4: Min assist Static Sitting - Comment/# of Minutes: Initial max (A) needed due to forward and posterior sway and left sided lean.  Pt max manual cues to promote midline for cervical and trunk.  Pt continues to have right gaze preference. Static Standing Balance Static Standing - Balance Support: No upper extremity supported;During functional activity Static Standing - Level of Assistance: 1: +2 Total assist  End of Session PT - End of Session Equipment Utilized During Treatment: Gait belt Activity Tolerance: Patient tolerated treatment well Patient left: in chair;with call bell/phone within reach Nurse Communication: Mobility status   GP     Chantilly Linskey 09/18/2012, 11:49 AM Jake Shark, PT DPT [phone]

## 2012-09-18 NOTE — Progress Notes (Addendum)
Stroke Team Progress Note  HISTORY Stephanie Mason is an 62 y.o. female who was in her normal state of health this evening 09/14/2012. She had something to drink and went to bed. Shortly after going to bed complained that she was unable to move her left side. Her husband attempted to help her and noted that she was unable to use it as well. EMS was called at that time and patient was brought to [facility]. Seizure noted in ED and Ativan given. Patient was evaluated there (NIH stroke scale of 16 documented) and felt eligible for tPA. Patient was then transferred here for further management. Patient has a history of a stroke on the right side of the brain that occurred about 22 years ago. Patient had fully recovered from that event. Current NIH of 15. She was admitted to the neuro ICU for further evaluation and treatment.  SUBJECTIVE Patient awake, talkative. RN shared she is doing well.  OBJECTIVE Most recent Vital Signs: Filed Vitals:   09/17/12 2000 09/18/12 0000 09/18/12 0442 09/18/12 0736  BP: 125/72 117/68 125/82   Pulse: 59 62 62   Temp: 97.7 F (36.5 C) 98.2 F (36.8 C) 97.4 F (36.3 C) 98 F (36.7 C)  TempSrc: Oral Oral Oral Oral  Resp: 17 17 18    Height:      Weight:   69.3 kg (152 lb 12.5 oz)   SpO2: 95% 94% 93%    CBG (last 3)  No results found for this basename: GLUCAP:3 in the last 72 hours  IV Fluid Intake:     . sodium chloride 75 mL/hr at 09/18/12 0626   MEDICATIONS     . aspirin EC  325 mg Oral Daily   Or  . aspirin  300 mg Rectal Daily  . enoxaparin (LOVENOX) injection  40 mg Subcutaneous Q24H  . irbesartan  150 mg Oral Daily  . levetiracetam  500 mg Intravenous Q12H  . pantoprazole (PROTONIX) IV  40 mg Intravenous QHS   PRN:  acetaminophen, acetaminophen, labetalol, ondansetron (ZOFRAN) IV, senna-docusate, traMADol  Diet:  Dysphagia 3 thin liquids Activity:  As tolerated DVT Prophylaxis:  SCDs   CLINICALLY SIGNIFICANT STUDIES Basic Metabolic  Panel:   Lab 09/16/12 1024  NA 140  K 4.4  CL 105  CO2 23  GLUCOSE 99  BUN 10  CREATININE 0.76  CALCIUM 9.3  MG --  PHOS --   Liver Function Tests: No results found for this basename: AST:2,ALT:2,ALKPHOS:2,BILITOT:2,PROT:2,ALBUMIN:2 in the last 168 hours CBC: No results found for this basename: WBC:2,NEUTROABS:2,HGB:2,HCT:2,MCV:2,PLT:2 in the last 168 hours Coagulation: No results found for this basename: LABPROT:4,INR:4 in the last 168 hours Cardiac Enzymes: No results found for this basename: CKTOTAL:3,CKMB:3,CKMBINDEX:3,TROPONINI:3 in the last 168 hours Urinalysis: No results found for this basename: COLORURINE:2,APPERANCEUR:2,LABSPEC:2,PHURINE:2,GLUCOSEU:2,HGBUR:2,BILIRUBINUR:2,KETONESUR:2,PROTEINUR:2,UROBILINOGEN:2,NITRITE:2,LEUKOCYTESUR:2 in the last 168 hours  Lipid Panel    Component Value Date/Time   CHOL 152 09/15/2012 0430   TRIG 63 09/15/2012 0430   HDL 52 09/15/2012 0430   CHOLHDL 2.9 09/15/2012 0430   VLDL 13 09/15/2012 0430   LDLCALC 87 09/15/2012 0430   HgbA1C  Lab Results  Component Value Date   HGBA1C 5.3 09/15/2012   Urine Drug Screen:   No results found for this basename: labopia,  cocainscrnur,  labbenz,  amphetmu,  thcu,  labbarb    Alcohol Level: No results found for this basename: ETH:2 in the last 168 hours  CT of the brain   09/19/2012  09/17/2012 Large area progressing infarct in the right  frontal and temporal lobes with a vague central hemorrhagic change better depicted on MRI. There is mass effect with increasing right to left midline shift now approximately 7 mm.  09/15/2012  Large right hemispheric acute/subacute infarct with mass effect and possible minimal petechial hemorrhage.  This finding appears to be superimposed upon a chronic right hemispheric infarct as indicated by prominence of the right sylvian fissure.  Compression of the frontal horn of the right lateral ventricle and 1.9 mm of midline shift to the left.   MRI of the brain 09/16/2012  Large acute  partially hemorrhagic right hemispheric infarct is superimposed upon remote infarct as detailed above.  This causes mass effect upon the right lateral ventricle with midline shift to the left by 7.4 mm.  Cerebellar atrophy.  Paranasal sinus mucosal thickening most notable maxillary sinuses and greater on the left     MRA of the brain 09/16/2012    Motion degraded examination limits evaluation as detailed above. What can be stated is that there is a decreased number of visualized right middle cerebral artery branch vessels consistent with the patient's acute and chronic infarct.   2D Echocardiogram  Normal LV function, no source of embolus.   Carotid Doppler  No evidence of hemodynamically significant internal carotid artery stenosis. Vertebral artery flow is antegrade.   TCD  This was a normal transcranial Doppler study, with normal flow direction and velocity of all identified vessels of the anterior and posterior circulations, with no evidence of stenosis, vasospasm or occlusion. There was no evidence of intracranial disease.    CXR  09/15/2012   1.  No active lung disease. 2.  Rounded opacity at the left lung base may represent focal eventration, but a mass cannot be excluded.   CT Chest 1. A fat containing Bochdalek hernia accounts for the questioned abnormality at the base of the left hemithorax on recent chest radiograph. 2. No acute findings.  EEG this is a normal awake EEG   Therapy Recommendations CIR  Physical Exam   Pleasant elderly african american lady not in distress.Awake alert. Afebrile. Head is nontraumatic. Neck is supple without bruit. Hearing is normal. Cardiac exam no murmur or gallop. Lungs are clear to auscultation. Distal pulses are well felt.  Neurological exam ; awake alert oriented x2. Mild expressive aphasia with word finding difficulties and paraphasic errors.Peri Jefferson comprehension. Poor naming and repetition. Left gaze preference but able to look past midline to the right  on command. Diminished blink to threat on the right. Mild right lower facial weakness. Significant left upper and lower extremity drift with grade 1-2 strength only on the left side. Increased tone on the left. Normal strength on the right . Diminished sensation on the left. Mild right sensory inattention. Right plantar upgoing. Left downgoing. Gait was not tested  .ASSESSMENT [name] is a 62 y.o. female presenting with left hemiparesis. Status post IV t-PA 09/14/2012 at 2348 at [facility]. She also had a seizure in their ED. Imaging confirms a large partially hemorrhagic right MCA infarct superimposed upon remote right brain infarct with cytotoxic cerebral edema and 7.70mm of midline shift. Remains at risk for neurologic worsening from cerebral edema as within the 5-7 day window. On aspirin 81 mg orally every day prior to admission. Now on aspirin 325 mg orally every day for secondary stroke prevention. Patient with resultant right gaze preference, left hemiparesis, hemisensory deficit. Work up underway.  New onset seizures, on Keppra Hypertension No lung mass per CT, it  is a  Bochdalek hernia Stroke 22 years ago No Right terminal ICA occlusion per TCD Creatinine 1.0 at [place] on admission LDL 87 HgbA1c 5.3  Hospital day # 3  TREATMENT/PLAN  Continue aspirin 325 mg orally every day for secondary stroke prevention.   Continue Keppra  CT head in am. If stable, transfer to the floor.  Annie Main, MSN, RN, ANVP-BC, ANP-BC, [facility] Pager: [phone] 09/18/2012 8:14 AM  I have personally obtained a history, examined the patient, evaluated imaging results, and formulated the assessment and plan of care. I agree with the above.   Delia Heady, MD Medical Director [facility] Pager: [phone] 09/18/2012 8:14 AM

## 2012-09-19 ENCOUNTER — Inpatient Hospital Stay (HOSPITAL_COMMUNITY): Payer: BC Managed Care – PPO

## 2012-09-19 MED ORDER — PANTOPRAZOLE SODIUM 40 MG PO TBEC
40.0000 mg | DELAYED_RELEASE_TABLET | Freq: Every day | ORAL | Status: DC
  Administered 2012-09-19 – 2012-09-21 (×3): 40 mg via ORAL
  Filled 2012-09-19 (×3): qty 1

## 2012-09-19 NOTE — Progress Notes (Signed)
Stroke Team Progress Note  HISTORY Stephanie Mason is an 62 y.o. female who was in her normal state of health this evening 09/14/2012. She had something to drink and went to bed. Shortly after going to bed complained that she was unable to move her left side. Her husband attempted to help her and noted that she was unable to use it as well. EMS was called at that time and patient was brought to [facility]. Seizure noted in ED and Ativan given. Patient was evaluated there (NIH stroke scale of 16 documented) and felt eligible for tPA. Patient was then transferred here for further management. Patient has a history of a stroke on the right side of the brain that occurred about 22 years ago. Patient had fully recovered from that event. Current NIH of 15. She was admitted to the neuro ICU for further evaluation and treatment.  SUBJECTIVE Patient awake, talkative. The patient denies headache or nausea. She does have some neck stiffness.  OBJECTIVE Most recent Vital Signs: Filed Vitals:   09/19/12 0000 09/19/12 0320 09/19/12 0400 09/19/12 0705  BP: 119/68 120/66  134/72  Pulse: 64 81 65 55  Temp: 98.5 F (36.9 C)  98.1 F (36.7 C) 97.7 F (36.5 C)  TempSrc: Oral  Oral   Resp: 17 23 15 14   Height:      Weight:      SpO2: 95% 98%  96%   CBG (last 3)  No results found for this basename: GLUCAP,  in the last 72 hours  IV Fluid Intake:  . sodium chloride 75 mL/hr at 09/19/12 0820   MEDICATIONS  . aspirin EC  325 mg Oral Daily   Or  . aspirin  300 mg Rectal Daily  . enoxaparin (LOVENOX) injection  40 mg Subcutaneous Q24H  . irbesartan  150 mg Oral Daily  . levetiracetam  500 mg Intravenous Q12H  . pantoprazole  40 mg Oral Q1200   PRN:  acetaminophen, acetaminophen, labetalol, ondansetron (ZOFRAN) IV, senna-docusate, sodium chloride, traMADol  Diet:  Dysphagia 3 thin liquids Activity:  As tolerated DVT Prophylaxis:  SCDs   CLINICALLY SIGNIFICANT STUDIES Basic Metabolic Panel:    Recent Labs Lab 09/16/12 1024  NA 140  K 4.4  CL 105  CO2 23  GLUCOSE 99  BUN 10  CREATININE 0.76  CALCIUM 9.3   Liver Function Tests: No results found for this basename: AST, ALT, ALKPHOS, BILITOT, PROT, ALBUMIN,  in the last 168 hours CBC: No results found for this basename: WBC, NEUTROABS, HGB, HCT, MCV, PLT,  in the last 168 hours Coagulation: No results found for this basename: LABPROT, INR,  in the last 168 hours Cardiac Enzymes: No results found for this basename: CKTOTAL, CKMB, CKMBINDEX, TROPONINI,  in the last 168 hours Urinalysis: No results found for this basename: COLORURINE, APPERANCEUR, LABSPEC, PHURINE, GLUCOSEU, HGBUR, BILIRUBINUR, KETONESUR, PROTEINUR, UROBILINOGEN, NITRITE, LEUKOCYTESUR,  in the last 168 hours  Lipid Panel    Component Value Date/Time   CHOL 152 09/15/2012 0430   TRIG 63 09/15/2012 0430   HDL 52 09/15/2012 0430   CHOLHDL 2.9 09/15/2012 0430   VLDL 13 09/15/2012 0430   LDLCALC 87 09/15/2012 0430   HgbA1C  Lab Results  Component Value Date   HGBA1C 5.3 09/15/2012   Urine Drug Screen:   No results found for this basename: labopia,  cocainscrnur,  labbenz,  amphetmu,  thcu,  labbarb    Alcohol Level: No results found for this basename: ETH,  in the  last 168 hours  CT of the brain   09/19/2012  IMPRESSION:  1. Stable slightly decreased mass effect associated with the right  ACA and MCA infarcts. Leftward midline shift now 4 mm.  2. Stable petechial hemorrhage.  3. No new intracranial abnormality.   09/17/2012 Large area progressing infarct in the right frontal and temporal lobes with a vague central hemorrhagic change better depicted on MRI. There is mass effect with increasing right to left midline shift now approximately 7 mm.  09/15/2012  Large right hemispheric acute/subacute infarct with mass effect and possible minimal petechial hemorrhage.  This finding appears to be superimposed upon a chronic right hemispheric infarct as indicated by prominence  of the right sylvian fissure.  Compression of the frontal horn of the right lateral ventricle and 1.9 mm of midline shift to the left.   MRI of the brain 09/16/2012  Large acute partially hemorrhagic right hemispheric infarct is superimposed upon remote infarct as detailed above.  This causes mass effect upon the right lateral ventricle with midline shift to the left by 7.4 mm.  Cerebellar atrophy.  Paranasal sinus mucosal thickening most notable maxillary sinuses and greater on the left     MRA of the brain 09/16/2012    Motion degraded examination limits evaluation as detailed above. What can be stated is that there is a decreased number of visualized right middle cerebral artery branch vessels consistent with the patient's acute and chronic infarct.   2D Echocardiogram  Normal LV function, no source of embolus.   Carotid Doppler  No evidence of hemodynamically significant internal carotid artery stenosis. Vertebral artery flow is antegrade.   TCD  This was a normal transcranial Doppler study, with normal flow direction and velocity of all identified vessels of the anterior and posterior circulations, with no evidence of stenosis, vasospasm or occlusion. There was no evidence of intracranial disease.    CXR  09/15/2012   1.  No active lung disease. 2.  Rounded opacity at the left lung base may represent focal eventration, but a mass cannot be excluded.   CT Chest 1. A fat containing Bochdalek hernia accounts for the questioned abnormality at the base of the left hemithorax on recent chest radiograph. 2. No acute findings.  EEG this is a normal awake EEG   Therapy Recommendations CIR  Physical Exam   The patient is alert and cooperative at the time of the examination.  Respiratory examination is clear.  Cardiovascular examination reveals a regular rate and rhythm, no obvious murmurs or rubs are noted.  Abdomen reveals positive bowel sounds, no organomegaly or tenderness noted.  Extremities  are without significant edema.  Neurologic examination reveals that the patient has a depression of the left nasolabial fold.  The patient has full extraocular movements, and visual fields are full.  Speech is not aphasic, slightly dysarthric.  The patient has significant weakness of the left arm and left leg, without voluntary motor movements. The patient has no neglect of the left side.  The patient reports symmetric soft touch sensation on one side to the next.  He can reflexes are relatively symmetric, slightly brisk in the legs, left Babinski is seen.  The patient is able to perform finger-nose-finger and heel-to-shin on the right side, not on the left. The patient could not be ambulated.    .ASSESSMENT [name] is a 62 y.o. female presenting with left hemiparesis. Status post IV t-PA 09/14/2012 at 2348 at [facility]. She also had a seizure  in their ED. Imaging confirms a large partially hemorrhagic right MCA infarct superimposed upon remote right brain infarct with cytotoxic cerebral edema and 7.69mm of midline shift. Remains at risk for neurologic worsening from cerebral edema as within the 5-7 day window. On aspirin 81 mg orally every day prior to admission. Now on aspirin 325 mg orally every day for secondary stroke prevention. Patient with resultant right gaze preference, left hemiparesis, hemisensory deficit. Work up underway.  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 The rehabilitation consult service has seen the patient, and the patient is felt to be an adequate candidate for transfer to the rehabilitation service at some point. Physical, occupational, and speech therapy are following. The patient appears to  have had improvement in the edema surrounding the stroke on the CT scan done today. The midline shift is down from 5.0 to 3.8 mm. No significant hemorrhagic conversion is noted. Plans will be made to transfer the patient is floor at this time.   TREATMENT/PLAN  Continue aspirin 325 mg orally every day for secondary stroke prevention.   Continue Keppra  Transfer to the floor  Rehab following Possible CIR transfer at some point.  Lesly Dukes  Pager: [phone] 09/19/2012 9:32 AM

## 2012-09-20 LAB — CBC WITH DIFFERENTIAL/PLATELET
Eosinophils Relative: 2 % (ref 0–5)
HCT: 39.4 % (ref 36.0–46.0)
Lymphocytes Relative: 32 % (ref 12–46)
Lymphs Abs: 1.4 10*3/uL (ref 0.7–4.0)
MCV: 101 fL — ABNORMAL HIGH (ref 78.0–100.0)
Monocytes Absolute: 0.4 10*3/uL (ref 0.1–1.0)
Platelets: 220 10*3/uL (ref 150–400)
RBC: 3.9 MIL/uL (ref 3.87–5.11)
WBC: 4.2 10*3/uL (ref 4.0–10.5)

## 2012-09-20 LAB — BASIC METABOLIC PANEL
CO2: 25 mEq/L (ref 19–32)
Calcium: 9 mg/dL (ref 8.4–10.5)
Glucose, Bld: 102 mg/dL — ABNORMAL HIGH (ref 70–99)
Sodium: 142 mEq/L (ref 135–145)

## 2012-09-20 MED ORDER — BISACODYL 10 MG RE SUPP
10.0000 mg | Freq: Every day | RECTAL | Status: DC | PRN
  Administered 2012-09-20: 10 mg via RECTAL
  Filled 2012-09-20: qty 1

## 2012-09-20 NOTE — Progress Notes (Signed)
Stroke Team Progress Note  HISTORY Stephanie Mason is an 62 y.o. female who was in her normal state of health this evening 09/14/2012. She had something to drink and went to bed. Shortly after going to bed complained that she was unable to move her left side. Her husband attempted to help her and noted that she was unable to use it as well. EMS was called at that time and patient was brought to [facility]. Seizure noted in ED and Ativan given. Patient was evaluated there (NIH stroke scale of 16 documented) and felt eligible for tPA. Patient was then transferred here for further management. Patient has a history of a stroke on the right side of the brain that occurred about 22 years ago. Patient had fully recovered from that event. Current NIH of 15. She was admitted to the neuro ICU for further evaluation and treatment.  SUBJECTIVE Patient awake, talkative. The patient denies headache or nausea. She states that she feels sluggish today.  OBJECTIVE Most recent Vital Signs: Filed Vitals:   09/19/12 1737 09/19/12 2157 09/20/12 0223 09/20/12 0551  BP: 143/68 147/68 118/66 142/66  Pulse: 72 73 63   Temp: 98.7 F (37.1 C) 99 F (37.2 C) 98.4 F (36.9 C) 97.8 F (36.6 C)  TempSrc: Oral Oral Oral Tympanic  Resp: 18 20 20 20   Height:      Weight:      SpO2: 99% 100% 97% 96%   CBG (last 3)  No results found for this basename: GLUCAP,  in the last 72 hours  IV Fluid Intake:  . sodium chloride 75 mL/hr at 09/19/12 0820   MEDICATIONS  . aspirin EC  325 mg Oral Daily   Or  . aspirin  300 mg Rectal Daily  . enoxaparin (LOVENOX) injection  40 mg Subcutaneous Q24H  . irbesartan  150 mg Oral Daily  . levetiracetam  500 mg Intravenous Q12H  . pantoprazole  40 mg Oral Q1200   PRN:  acetaminophen, acetaminophen, labetalol, ondansetron (ZOFRAN) IV, senna-docusate, sodium chloride, traMADol  Diet:  Dysphagia 3 thin liquids Activity:  As tolerated DVT Prophylaxis:  SCDs   CLINICALLY  SIGNIFICANT STUDIES Basic Metabolic Panel:   Recent Labs Lab 09/16/12 1024 09/20/12 0540  NA 140 142  K 4.4 3.9  CL 105 107  CO2 23 25  GLUCOSE 99 102*  BUN 10 10  CREATININE 0.76 0.84  CALCIUM 9.3 9.0   Liver Function Tests: No results found for this basename: AST, ALT, ALKPHOS, BILITOT, PROT, ALBUMIN,  in the last 168 hours CBC:   Recent Labs Lab 09/20/12 0540  WBC 4.2  NEUTROABS 2.3  HGB 13.0  HCT 39.4  MCV 101.0*  PLT 220   Coagulation: No results found for this basename: LABPROT, INR,  in the last 168 hours Cardiac Enzymes: No results found for this basename: CKTOTAL, CKMB, CKMBINDEX, TROPONINI,  in the last 168 hours Urinalysis: No results found for this basename: COLORURINE, APPERANCEUR, LABSPEC, PHURINE, GLUCOSEU, HGBUR, BILIRUBINUR, KETONESUR, PROTEINUR, UROBILINOGEN, NITRITE, LEUKOCYTESUR,  in the last 168 hours  Lipid Panel    Component Value Date/Time   CHOL 152 09/15/2012 0430   TRIG 63 09/15/2012 0430   HDL 52 09/15/2012 0430   CHOLHDL 2.9 09/15/2012 0430   VLDL 13 09/15/2012 0430   LDLCALC 87 09/15/2012 0430   HgbA1C  Lab Results  Component Value Date   HGBA1C 5.3 09/15/2012   Urine Drug Screen:   No results found for this basename: labopia,  cocainscrnur,  labbenz,  amphetmu,  thcu,  labbarb    Alcohol Level: No results found for this basename: ETH,  in the last 168 hours  CT of the brain   09/19/2012  IMPRESSION:  1. Stable slightly decreased mass effect associated with the right  ACA and MCA infarcts. Leftward midline shift now 4 mm.  2. Stable petechial hemorrhage.  3. No new intracranial abnormality.   09/17/2012 Large area progressing infarct in the right frontal and temporal lobes with a vague central hemorrhagic change better depicted on MRI. There is mass effect with increasing right to left midline shift now approximately 7 mm.  09/15/2012  Large right hemispheric acute/subacute infarct with mass effect and possible minimal petechial hemorrhage.   This finding appears to be superimposed upon a chronic right hemispheric infarct as indicated by prominence of the right sylvian fissure.  Compression of the frontal horn of the right lateral ventricle and 1.9 mm of midline shift to the left.   MRI of the brain 09/16/2012  Large acute partially hemorrhagic right hemispheric infarct is superimposed upon remote infarct as detailed above.  This causes mass effect upon the right lateral ventricle with midline shift to the left by 7.4 mm.  Cerebellar atrophy.  Paranasal sinus mucosal thickening most notable maxillary sinuses and greater on the left     MRA of the brain 09/16/2012    Motion degraded examination limits evaluation as detailed above. What can be stated is that there is a decreased number of visualized right middle cerebral artery branch vessels consistent with the patient's acute and chronic infarct.   2D Echocardiogram  Normal LV function, no source of embolus.   Carotid Doppler  No evidence of hemodynamically significant internal carotid artery stenosis. Vertebral artery flow is antegrade.   TCD  This was a normal transcranial Doppler study, with normal flow direction and velocity of all identified vessels of the anterior and posterior circulations, with no evidence of stenosis, vasospasm or occlusion. There was no evidence of intracranial disease.    CXR  09/15/2012   1.  No active lung disease. 2.  Rounded opacity at the left lung base may represent focal eventration, but a mass cannot be excluded.   CT Chest 1. A fat containing Bochdalek hernia accounts for the questioned abnormality at the base of the left hemithorax on recent chest radiograph. 2. No acute findings.  EEG this is a normal awake EEG   Therapy Recommendations CIR  Physical Exam   The patient is alert and cooperative at the time of the examination. Flat affect.  Respiratory examination is clear.  Cardiovascular examination reveals a regular rate and rhythm, no obvious  murmurs or rubs are noted.  Abdomen reveals positive bowel sounds, no organomegaly or tenderness noted.  Extremities are without significant edema.  Neurologic examination reveals that the patient has a depression of the left nasolabial fold.  The patient has full extraocular movements, and visual fields are full, with the exception that there appears to be a left inferior quadrantopia.   Speech is not aphasic, slightly dysarthric.  The patient has significant weakness of the left arm and left leg, without voluntary motor movements. The patient has no neglect of the left side.  The patient reports symmetric soft touch sensation on one side to the next.  He can reflexes are relatively symmetric, slightly brisk in the legs, left Babinski is seen.  The patient is able to perform finger-nose-finger and heel-to-shin on the right side, not on the left.  The patient could not be ambulated.    .ASSESSMENT [name] is a 62 y.o. female presenting with left hemiparesis. Status post IV t-PA 09/14/2012 at 2348 at [facility]. She also had a seizure in their ED. Imaging confirms a large partially hemorrhagic right MCA infarct superimposed upon remote right brain infarct with cytotoxic cerebral edema and 7.47mm of midline shift. Remains at risk for neurologic worsening from cerebral edema as within the 5-7 day window. On aspirin 81 mg orally every day prior to admission. Now on aspirin 325 mg orally every day for secondary stroke prevention. Patient with resultant right gaze preference, left hemiparesis, hemisensory deficit. Work up underway.  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 The rehabilitation consult service has seen the  patient, and the patient is felt to be an adequate candidate for transfer to the rehabilitation service at some point. Physical, occupational, and speech therapy are following. The patient appears to have had improvement in the edema surrounding the stroke on the CT scan done yesterday. The shift changed 5.0 to 3.8 mm. No significant hemorrhagic conversion is noted. Plans will be made to transfer the patient is floor at this time.   TREATMENT/PLAN  Continue aspirin 325 mg orally every day for secondary stroke prevention.   Continue Keppra  Rehab following Possible CIR transfer at some point.  Ongoing PT, OT  Lesly Dukes  Pager: [phone] 09/20/2012 12:01 PM

## 2012-09-20 NOTE — Progress Notes (Deleted)
Yellow DNR form found in chart. No order in [facility] for DNR. RN questioned pt if she was truly a DNR. She stated yes but she would like to revoke that and become a full code. Dr. Nehemiah Settle was notified. Yellow DNR discarded per Dr. Nehemiah Settle.

## 2012-09-21 MED ORDER — SODIUM CHLORIDE 0.9 % IV SOLN
INTRAVENOUS | Status: DC
  Administered 2012-09-22: 500 mL via INTRAVENOUS

## 2012-09-21 NOTE — Progress Notes (Signed)
Occupational Therapy Treatment Patient Details Name: Stephanie Mason MRN: [id number] DOB: 09/22/1950 Today's Date: 09/21/2012 Time: 6213-0865 OT Time Calculation (min): 38 min  OT Assessment / Plan / Recommendation Comments on Treatment Session Pt with significant left hemipareis, and visula inattention/neglect.  Still overall needs total assist for sit to stand, bedmobility and functional transfers.  Will benefit from increased rehab at CIR level.    Follow Up Recommendations  CIR       Equipment Recommendations  Tub/shower bench;3 in 1 bedside comode    Recommendations for Other Services Rehab consult  Frequency Min 3X/week   Plan Discharge plan remains appropriate    Precautions / Restrictions Precautions Precautions: Fall Precaution Comments: dense L hemiplegia, incresed extensor tone LLE, left neglect Restrictions Weight Bearing Restrictions: No   Pertinent Vitals/Pain Pain with 2 episodes of pain in her left shoulder in sitting with the UE supported.  No pain with PROM however    ADL  Toilet Transfer: +1 Total assistance;Performed Toilet Transfer Method: Sit to stand Toilet Transfer Equipment: Bedside commode (simulated EOB) Toileting - Clothing Manipulation and Hygiene: +2 Total assistance;Performed Toileting - Clothing Manipulation and Hygiene: Patient Percentage: 30% Where Assessed - Toileting Clothing Manipulation and Hygiene: Other (comment) (sit to stand at the edge of the bed) Transfers/Ambulation Related to ADLs: Pt required total assist for stand step up the side of the bed.  Total assist to move the LLE as well. ADL Comments: Pt transitioned to EOB with total assist and max instructional cueing to sequence transfer.  Worked on static and dynamic sitting balance.  Pt overall needs mod assist for static sitting.  Frequent LOB to the left with the need for therapist to help her regain it.  She is able to state and initiate LOB but sometimes her reaction to  selfcorrect is delayed.  Performed sit to stand times 2 as well with total assist to maintain balance and pt leaning to the right side.  Pt reported pain on 2 occasions in the left shoulder in sitting  but PROM in supine WFLS without any pain.  Pt needing max instructional cueing to scan her left visual field to locate items for reaching on her bedside table.      OT Goals ADL Goals ADL Goal: Toilet Transfer - Progress: Progressing toward goals Miscellaneous OT Goals OT Goal: Miscellaneous Goal #1 - Progress: Progressing toward goals  Visit Information  Last OT Received On: 09/21/12 Assistance Needed: +2    Subjective Data  Subjective: I had a stroke Patient Stated Goal: Not stated during this session.      Cognition  Cognition Overall Cognitive Status: Impaired Area of Impairment: Attention;Safety/judgement;Problem solving Arousal/Alertness: Awake/alert Orientation Level: Appears intact for tasks assessed Behavior During Session: [facility] for tasks performed Current Attention Level: Sustained Safety/Judgement: Decreased awareness of safety precautions    Mobility  Bed Mobility Bed Mobility: Rolling Right;Right Sidelying to Sit Rolling Right: 2: Max assist Rolling Left: 4: Min guard Right Sidelying to Sit: HOB flat;2: Max assist Supine to Sit: 2: Max assist Sitting - Scoot to [place] of Bed: 2: Max assist Details for Bed Mobility Assistance: Pt needing max instructional cueing for sequence for bed mobility. Transfers Transfers: Sit to Stand Sit to Stand: 1: +1 Total assist;From bed Stand to Sit: 1: +1 Total assist       Balance Balance Balance Assessed: Yes Static Sitting Balance Static Sitting - Balance Support: Right upper extremity supported Static Sitting - Level of Assistance: 3: Mod assist Static Sitting -  Comment/# of Minutes: able to sit for 10-15 seconds before LOB Dynamic Sitting Balance Dynamic Sitting - Balance Support: Right upper extremity supported Dynamic  Sitting - Level of Assistance: 2: Max assist Dynamic Sitting Balance - Compensations: cues to look upright and to line upright with verticle of window, pt tends to look to R.     End of Session OT - End of Session Activity Tolerance: Patient tolerated treatment well Patient left: in bed;with call bell/phone within reach;with bed alarm set Nurse Communication: Mobility status;Precautions     Tsion Inghram OTR/L Pager number [phone] 09/21/2012, 4:08 PM

## 2012-09-21 NOTE — Progress Notes (Signed)
Stroke Team Progress Note  HISTORY Stephanie Mason is an 61 y.o. female who was in her normal state of health this evening 09/14/2012. She had something to drink and went to bed. Shortly after going to bed complained that she was unable to move her left side. Her husband attempted to help her and noted that she was unable to use it as well. EMS was called at that time and patient was brought to [facility]. Seizure noted in ED and Ativan given. Patient was evaluated there (NIH stroke scale of 16 documented) and felt eligible for tPA. Patient was then transferred here for further management. Patient has a history of a stroke on the right side of the brain that occurred about 22 years ago. Patient had fully recovered from that event. Current NIH of 15. She was admitted to the neuro ICU for further evaluation and treatment.  SUBJECTIVE No complaints. Family member at bedside.  OBJECTIVE Most recent Vital Signs: Filed Vitals:   09/20/12 2200 09/21/12 0200 09/21/12 0600 09/21/12 0937  BP: 126/71 152/68 145/67 135/70  Pulse: 62 63 57 60  Temp: 97.7 F (36.5 C) 98.1 F (36.7 C) 97.9 F (36.6 C) 97.2 F (36.2 C)  TempSrc: Oral Oral Oral Oral  Resp: 18 18 18 20  Height:      Weight:      SpO2: 100% 98% 97% 98%   CBG (last 3)  No results found for this basename: GLUCAP,  in the last 72 hours  IV Fluid Intake:  . sodium chloride 75 mL/hr at 09/21/12 0018   MEDICATIONS  . aspirin EC  325 mg Oral Daily   Or  . aspirin  300 mg Rectal Daily  . enoxaparin (LOVENOX) injection  40 mg Subcutaneous Q24H  . irbesartan  150 mg Oral Daily  . levetiracetam  500 mg Intravenous Q12H  . pantoprazole  40 mg Oral Q1200   PRN:  acetaminophen, acetaminophen, bisacodyl, labetalol, ondansetron (ZOFRAN) IV, senna-docusate, sodium chloride, traMADol  Diet:  Dysphagia 3 thin liquids Activity:  As tolerated DVT Prophylaxis:  SCDs   CLINICALLY SIGNIFICANT STUDIES Basic Metabolic Panel:   Recent  Labs Lab 09/16/12 1024 09/20/12 0540  NA 140 142  K 4.4 3.9  CL 105 107  CO2 23 25  GLUCOSE 99 102*  BUN 10 10  CREATININE 0.76 0.84  CALCIUM 9.3 9.0   Liver Function Tests: No results found for this basename: AST, ALT, ALKPHOS, BILITOT, PROT, ALBUMIN,  in the last 168 hours CBC:   Recent Labs Lab 09/20/12 0540  WBC 4.2  NEUTROABS 2.3  HGB 13.0  HCT 39.4  MCV 101.0*  PLT 220   Coagulation: No results found for this basename: LABPROT, INR,  in the last 168 hours Cardiac Enzymes: No results found for this basename: CKTOTAL, CKMB, CKMBINDEX, TROPONINI,  in the last 168 hours Urinalysis: No results found for this basename: COLORURINE, APPERANCEUR, LABSPEC, PHURINE, GLUCOSEU, HGBUR, BILIRUBINUR, KETONESUR, PROTEINUR, UROBILINOGEN, NITRITE, LEUKOCYTESUR,  in the last 168 hours  Lipid Panel    Component Value Date/Time   CHOL 152 09/15/2012 0430   TRIG 63 09/15/2012 0430   HDL 52 09/15/2012 0430   CHOLHDL 2.9 09/15/2012 0430   VLDL 13 09/15/2012 0430   LDLCALC 87 09/15/2012 0430   HgbA1C  Lab Results  Component Value Date   HGBA1C 5.3 09/15/2012   Urine Drug Screen:   No results found for this basename: labopia,  cocainscrnur,  labbenz,  amphetmu,  thcu,  labbarb      Alcohol Level: No results found for this basename: ETH,  in the last 168 hours  CT of the brain  09/19/2012  1. Stable slightly decreased mass effect associated with the right  ACA  and MCA infarcts. Leftward midline shift now 4 mm.  2. Stable petechial hemorrhage.  3. No new intracranial abnormality.   09/17/2012 Large area progressing infarct in the right frontal and temporal lobes with a vague central hemorrhagic change better depicted on MRI. There is mass effect with increasing right to left midline shift now approximately 7 mm.  09/15/2012  Large right hemispheric acute/subacute infarct with mass effect and possible minimal petechial hemorrhage.  This finding appears to be superimposed upon a chronic right hemispheric  infarct as indicated by prominence of the right sylvian fissure.  Compression of the frontal horn of the right lateral ventricle and 1.9 mm of midline shift to the left.   MRI of the brain 09/16/2012  Large acute partially hemorrhagic right hemispheric infarct is superimposed upon remote infarct as detailed above.  This causes mass effect upon the right lateral ventricle with midline shift to the left by 7.4 mm.  Cerebellar atrophy.  Paranasal sinus mucosal thickening most notable maxillary sinuses and greater on the left     MRA of the brain 09/16/2012    Motion degraded examination limits evaluation as detailed above. What can be stated is that there is a decreased number of visualized right middle cerebral artery branch vessels consistent with the patient's acute and chronic infarct.   2D Echocardiogram  Normal LV function, no source of embolus.   Carotid Doppler  No evidence of hemodynamically significant internal carotid artery stenosis. Vertebral artery flow is antegrade.   TCD  This was a normal transcranial Doppler study, with normal flow direction and velocity of all identified vessels of the anterior and posterior circulations, with no evidence of stenosis, vasospasm or occlusion. There was no evidence of intracranial disease.    CXR  09/15/2012   1.  No active lung disease. 2.  Rounded opacity at the left lung base may represent focal eventration, but a mass cannot be excluded.   CT Chest 09/16/2012 1. A fat containing Bochdalek hernia accounts for the questioned abnormality at the base of the left hemithorax on recent chest radiograph. 2. No acute findings.  EEG this is a normal awake EEG   Therapy Recommendations CIR  Physical Exam   The patient is alert and cooperative at the time of the examination. Flat affect. Respiratory examination is clear. Cardiovascular examination reveals a regular rate and rhythm, no obvious murmurs or rubs are noted. Abdomen reveals positive bowel sounds, no  organomegaly or tenderness noted. Extremities are without significant edema. Neurologic examination reveals that the patient has a depression of the left nasolabial fold. The patient has full extraocular movements, and visual fields are full, with the exception that there appears to be a left inferior quadrantopia.  Speech is not aphasic, slightly dysarthric. The patient has significant weakness of the left arm and left leg, without voluntary motor movements. The patient has no neglect of the left side. The patient reports symmetric soft touch sensation on one side to the next. He can reflexes are relatively symmetric, slightly brisk in the legs, left Babinski is seen. The patient is able to perform finger-nose-finger and heel-to-shin on the right side, not on the left. The patient could not be ambulated.  .ASSESSMENT Stephanie Mason is a 61 y.o. female presenting with left hemiparesis. Status post   IV t-PA 09/14/2012 at 2348 at [facility]. She also had a seizure in their ED. Imaging confirms a large partially hemorrhagic right MCA infarct superimposed upon remote right brain infarct with cytotoxic cerebral edema and 7.4mm of midline shift. Now beyond the window for neurologic worsening from cerebral edema. Infarct embolic secondary to unknown etiology. On aspirin 81 mg orally every day prior to admission. Now on aspirin 325 mg orally every day for secondary stroke prevention. Patient with resultant right gaze preference, left hemiparesis, hemisensory deficit. Work up underway. CIR recommended.  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   Continue Keppra TEE to look for embolic source. Arranged with [facility] for tomorrow.  If positive for PFO (patent foramen ovale), check bilateral lower extremity venous dopplers to rule out DVT as possible source of stroke.  Transfer to rehab post TEE if bed available Will schedule outpatient telemetry monitoring to assess patient for atrial fibrillation as source of stroke.   SHARON BIBY, MSN, RN, ANVP-BC, ANP-BC, GNP-BC [facility] Pager: [phone] 09/21/2012 3:00 PM  I have personally obtained a history, examined the patient, evaluated imaging results, and formulated the assessment and plan of care. I agree with the above. Brigette Hopfer, MD Medical Director [facility] Pager: [phone] 09/21/2012 7:35 PM  

## 2012-09-21 NOTE — Progress Notes (Signed)
SLP reviewed and agree with student findings.   Dierks Wach MA, CCC-SLP [phone]    

## 2012-09-21 NOTE — Progress Notes (Signed)
Will f/up on pt tomorrow for medical readiness to d/c to CIR [will also need to check bed availability on CIR].  Pt's insurance has already approved pt for CIR.  [phone]

## 2012-09-21 NOTE — Progress Notes (Signed)
Physical Therapy Treatment Patient Details Name: Stephanie Mason MRN: [id number] DOB: Dec 10, 1950 Today's Date: 09/21/2012 Time: 0865-7846 PT Time Calculation (min): 48 min  PT Assessment / Plan / Recommendation Comments on Treatment Session  P with increased extensor tone LLE, especially during rolling and sitting up. Pt was participatory for all of mobility. Pt. continues with L hemiplegia. Pt. is  excellent candidate for CIR.    Follow Up Recommendations  CIR     Does the patient have the potential to tolerate intense rehabilitation     Barriers to Discharge        Equipment Recommendations  None recommended by PT    Recommendations for Other Services Rehab consult  Frequency Min 4X/week   Plan Discharge plan remains appropriate;Frequency remains appropriate    Precautions / Restrictions Precautions Precautions: Fall Precaution Comments: dense L hemiplegia, incresed extensor tone LLE   Pertinent Vitals/Pain     Mobility  Bed Mobility Bed Mobility: Rolling Right;Rolling Left Rolling Right: 3: Mod assist Rolling Left: 4: Min guard Supine to Sit: 2: Max assist Sitting - Scoot to [place] of Bed: 2: Max assist Details for Bed Mobility Assistance: assist to flex LLE in prep to roll, Increased extensor tone with effort. rolls well to L side, support of LLE to edge and to right trunk from R sidelying. Transfers Transfers: Not assessed    Exercises     PT Diagnosis:    PT Problem List:   PT Treatment Interventions:     PT Goals Acute Rehab PT Goals PT Goal Formulation: With patient Time For Goal Achievement: 09/21/12 Potential to Achieve Goals: Good Pt will Roll Supine to Right Side: with supervision PT Goal: Rolling Supine to Right Side - Progress: Goal set today Pt will Roll Supine to Left Side: with modified independence PT Goal: Rolling Supine to Left Side - Progress: Goal set today Pt will go Supine/Side to Sit: with min assist PT Goal: Supine/Side to Sit -  Progress: Progressing toward goal Pt will Sit at Edge of Bed: with min assist;6-10 min;with unilateral upper extremity support PT Goal: Sit at Edge Of Bed - Progress: Goal set today Pt will go Sit to Supine/Side: with min assist PT Goal: Sit to Supine/Side - Progress: Progressing toward goal  Visit Information  Last PT Received On: 09/21/12 Assistance Needed: +2 (+ 2 transfers/standing.[id number])    Subjective Data  Subjective: My neck is stiff. My Left side is dead.   Cognition  Cognition Overall Cognitive Status: Impaired Area of Impairment: Safety/judgement Arousal/Alertness: Awake/alert Orientation Level: Appears intact for tasks assessed Behavior During Session: [facility] for tasks performed Current Attention Level: Selective Safety/Judgement: Decreased awareness of safety precautions    Balance  Balance Balance Assessed: Yes Static Sitting Balance Static Sitting - Balance Support: Feet supported Static Sitting - Level of Assistance: 2: Max assist;4: Min assist Static Sitting - Comment/# of Minutes: varies, does hold near midline if holds to rail, when R hand  not supporting has tendency to lean  to L. Dynamic Sitting Balance Dynamic Sitting - Balance Support: Right upper extremity supported Dynamic Sitting - Level of Assistance: 4: Min assist;2: Max assist Dynamic Sitting Balance - Compensations: cues to look upright and to line upright with verticle of window, pt tends to look to R.  : pt practiced leaning to left and right while RUE is supported by PT. Dynamic Sitting - Balance Activities: Lateral lean/weight shifting;Forward lean/weight shifting Pt sat at edge x 15 minutes  End of Session PT - End of  Session Activity Tolerance: Patient tolerated treatment well Patient left: in bed;with call bell/phone within reach Nurse Communication: Mobility status;Need for lift equipment   GP     Rada Hay 09/21/2012, 1:34 [phone]

## 2012-09-21 NOTE — Progress Notes (Signed)
SLP reviewed and agree with student findings.   Timiko Offutt MA, CCC-SLP [phone]    

## 2012-09-21 NOTE — Progress Notes (Signed)
Speech Language Pathology Dysphagia Treatment Patient Details Name: Stephanie Mason MRN: [id number] DOB: 14-Jul-1951 Today's Date: 09/21/2012 Time: 0950-1003 SLP Time Calculation (min): 13 min  Assessment / Plan / Recommendation Clinical Impression  Treatment focused on diet tolerance with patient use of compensatory strategies. Patient continues with mild buccal (left) pocketing of solids, however patient verbalizes understanding of using a lingual sweep and/or her finger to remove the food after meals and was observed doing so independently by SLP. Patient also was observed with anterior spillage on left side on 1/4 po trials, that required verbal cues from therapist to clear it. No overt s/s of aspiration noted. SLP recommends patient continue with current diet and use of compensatory strategies and strict use of aspiration precautions at this time with moderate supervision. SLP will f/u for education with patient on use of compensatory straegies and diet tolerance.    Diet Recommendation  Continue with Current Diet: Dysphagia 3 (mechanical soft);Thin liquid    SLP Plan Continue with current plan of care   Pertinent Vitals/Pain None reported   Swallowing Goals  SLP Swallowing Goals Patient will utilize recommended strategies during swallow to increase swallowing safety with: Supervision/safety Swallow Study Goal #2 - Progress: Progressing toward goal  General Temperature Spikes Noted: No Respiratory Status: Room air Behavior/Cognition: Alert;Cooperative;Pleasant mood Oral Cavity - Dentition: Adequate natural dentition Patient Positioning: Upright in bed  Oral Cavity - Oral Hygiene     Dysphagia Treatment Treatment focused on: Skilled observation of diet tolerance;Upgraded PO texture trials;Patient/family/caregiver education;Utilization of compensatory strategies Treatment Methods/Modalities: Skilled observation Patient observed directly with PO's: Yes Type of PO's observed:  Dysphagia 3 (soft);Thin liquids Feeding: Able to feed self;Needs assist (needs assist due to inability to move left arm) Liquids provided via: Cup Oral Phase Signs & Symptoms: Left pocketing;Anterior loss/spillage Type of cueing: Verbal Amount of cueing: Minimal   GO   Berdine Dance SLP student  Berdine Dance 09/21/2012, 1:21 PM

## 2012-09-21 NOTE — Progress Notes (Signed)
Pt lying in bed awake.  Alert and oriented x 4.  Left side flaccid with decreased sensation.  Follows commands on right.  No s/s of any acute distress noted.  Bath given at this time.  PT at bedside to work with pt.

## 2012-09-21 NOTE — H&P (Signed)
Physical Medicine and Rehabilitation Admission H&P    No chief complaint on file. : HPI: Stephanie Mason is a 62 y.o. 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 Admitted 09/15/2012 to [facility] with left-sided weakness. Patient with noted seizure in the ED and received Ativan as well as loaded with Keppra. MRI of the brain showed large acute partially hemorrhagic right hemispheric infarct superimposed upon remote infarct. Patient did receive TPA. Echocardiogram with normal left ventricular function no source of embolus. EEG showed no seizure activity. Carotid Dopplers with no ICA stenosis. TCD with right terminal ICA stenosis. Patient was transferred to [facility] for ongoing evaluation. Placed on aspirin therapy for stroke prophylaxis as well as subcutaneous Lovenox for DVT prophylaxis. TEE completed 09/22/2012 showing no PFO or thrombus. Maintained on a dysphagia 3 thin liquid diet. Chest x-ray with questionable mass that was ruled out with CT of the chest. Physical and occupational therapy evaluations completed 09/17/2012 with recommendations of physical medicine rehabilitation consult to consider inpatient rehabilitation services  Review of Systems  Neurological: Positive for dizziness and seizures.  All other systems reviewed and are negative   Past Medical History  Diagnosis Date  . Stroke   . Hypertension    History reviewed. No pertinent past surgical history. History reviewed. No pertinent family history. Social History:  reports that she has never smoked. She does not have any smokeless tobacco history on file. She reports that she does not drink alcohol or use illicit drugs. Allergies: No Known Allergies Medications Prior to Admission  Medication Sig Dispense Refill  . telmisartan (MICARDIS) 40 MG tablet Take 40 mg by mouth daily.        Home: Home Living Lives With: Spouse Type of Home: House    Functional History: Prior Function Vocation:  (works at [employer] per SLP Tacey Ruiz [facility] [employer] [facility] health Care))  Functional Status:  Mobility: Bed Mobility Bed Mobility: Supine to Sit;Sitting - Scoot to Edge of Bed Supine to Sit: 1: +2 Total assist;HOB flat Supine to Sit: Patient Percentage: 40% Sitting - Scoot to Edge of Bed: 2: Max assist Transfers Transfers: Sit to Stand;Stand to Sit Sit to Stand: 1: +2 Total assist;From bed Sit to Stand: Patient Percentage: 60% Stand to Sit: 1: +2 Total assist;To chair/3-in-1 Stand to Sit: Patient Percentage: 60% Stand Pivot Transfers: 1: +2 Total assist Stand Pivot Transfers: Patient Percentage: 50% Ambulation/Gait Ambulation/Gait Assistance: Not tested (comment)    ADL: ADL Eating/Feeding: Set up (noticeable left side pocketing and only attending to Right ) Where Assessed - Eating/Feeding: Bed level Grooming: Moderate assistance Where Assessed - Grooming: Supported sitting Toilet Transfer: +2 Total assistance Toilet Transfer Equipment: Regular height toilet Equipment Used: Gait belt Transfers/Ambulation Related to ADLs: Pt with Lt LE hyperextension during transfer. Pt required total (A) to advance Lt LE. Pt stepping with Rt LE. Pt's extensor tone assisting with static standing.  ADL Comments: Pt with decorticate posturing with Lt UE flexor tone and lt le extension. Pt lacks awareness to Lt UE positioning and high risk for injury. pt required (A) to reposition LT UE.Pt moving Rt UE with v/c. Pt with pain response at nail bed of Lt LE. Pt reports no numbness in Lt UE/ LE at this time. Pt attending to rt side and tracks left with mod v/c. pt eating half of breakfast and no awareness to left side of plate.   Cognition: Cognition Overall Cognitive Status: Impaired Arousal/Alertness: Awake/alert Orientation Level: Oriented X4 Attention: Sustained (  possibly due to extreme lethargy and discomfort in left arm) Sustained Attention: Impaired Sustained  Attention Impairment: Verbal complex Memory: Impaired Memory Impairment: Storage deficit Awareness: Appears intact Cognition Overall Cognitive Status: Impaired Area of Impairment: Attention;Memory;Safety/judgement;Awareness of errors;Awareness of deficits;Problem solving Arousal/Alertness: Awake/alert Orientation Level: Appears intact for tasks assessed Behavior During Session: [facility] for tasks performed Current Attention Level: Selective Safety/Judgement: Decreased safety judgement for tasks assessed;Decreased awareness of safety precautions Awareness of Errors: Assistance required to identify errors made;Assistance required to correct errors made Awareness of Errors - Other Comments: Pt without awareness to objects on left side of tray with max questioning cues Problem Solving: Pt needs assistance to correct errors and recognition of deficits   Blood pressure 145/67, pulse 57, temperature 97.9 F (36.6 C), temperature source Oral, resp. rate 18, height 5\' 7"  (1.702 m), weight 69.3 kg (152 lb 12.5 oz), SpO2 97.00%. Physical Exam  Vitals reviewed.  Constitutional: She appears well-developed and well-nourished.  HENT:  Head: Normocephalic and atraumatic.  Right Ear: External ear normal.  Left Ear: External ear normal.  Eyes: Conjunctivae normal are normal.  Pupils reactive to light  Neck: Normal range of motion. Neck supple. No JVD present. No tracheal deviation present. No thyromegaly present.  Cardiovascular: Normal rate and regular rhythm. No murmur Pulmonary/Chest: Effort normal and breath sounds normal. No respiratory distress.  Abdominal: Soft. Bowel sounds are normal. She exhibits no distension. There is no tenderness.  Musculoskeletal: She exhibits no edema.  Lymphadenopathy:  She has no cervical adenopathy.  Neurological: She is fairly alert.  Flat affect. Patient with right gaze preference. She follows simple commands. Names person, place and date of birth. 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 Left facial droop, mild left tongue deviation. Speech slurred. Appears fatigued Sensation present but diminished to PP  on the left. LUE 0/5. Trace to 0/5 LLE. Extensor tone noted. Toes up. No clonus. DTR's 3+.  Skin: Skin is warm and dry   Results for orders placed during the hospital encounter of 09/15/12 (from the past 48 hour(s))  CBC WITH DIFFERENTIAL     Status: Abnormal   Collection Time    09/20/12  5:40 AM      Result Value Range   WBC 4.2  4.0 - 10.5 K/uL   RBC 3.90  3.87 - 5.11 MIL/uL   Hemoglobin 13.0  12.0 - 15.0 g/dL   HCT 65.7  84.6 - 96.2 %   MCV 101.0 (*) 78.0 - 100.0 fL   MCH 33.3  26.0 - 34.0 pg   MCHC 33.0  30.0 - 36.0 g/dL   RDW 95.2  84.1 - 32.4 %   Platelets 220  150 - 400 K/uL   Neutrophils Relative 55  43 - 77 %   Neutro Abs 2.3  1.7 - 7.7 K/uL   Lymphocytes Relative 32  12 - 46 %   Lymphs Abs 1.4  0.7 - 4.0 K/uL   Monocytes Relative 10  3 - 12 %   Monocytes Absolute 0.4  0.1 - 1.0 K/uL   Eosinophils Relative 2  0 - 5 %   Eosinophils Absolute 0.1  0.0 - 0.7 K/uL   Basophils Relative 1  0 - 1 %   Basophils Absolute 0.0  0.0 - 0.1 K/uL  BASIC METABOLIC PANEL     Status: Abnormal   Collection Time    09/20/12  5:40 AM      Result Value Range   Sodium 142  135 - 145 mEq/L  Potassium 3.9  3.5 - 5.1 mEq/L   Chloride 107  96 - 112 mEq/L   CO2 25  19 - 32 mEq/L   Glucose, Bld 102 (*) 70 - 99 mg/dL   BUN 10  6 - 23 mg/dL   Creatinine, Ser 1.61  0.50 - 1.10 mg/dL   Calcium 9.0  8.4 - 09.6 mg/dL   GFR calc non Af Amer 75 (*) >90 mL/min   GFR calc Af Amer 86 (*) >90 mL/min   Comment:            The eGFR has been calculated     using the CKD EPI equation.     This calculation has not been     validated in all clinical     situations.     eGFR's persistently     <90 mL/min signify     possible Chronic Kidney Disease.   No results found.  Post Admission Physician Evaluation: 1. Functional deficits secondary  to hemorrhagic right MCA  infarct superimposed upon remote right brain infarct with left hemiparesis, and visual-spatial deficits.  2. Patient is admitted to receive collaborative, interdisciplinary care between the physiatrist, rehab nursing staff, and therapy team. 3. Patient's level of medical complexity and substantial therapy needs in context of that medical necessity cannot be provided at a lesser intensity of care such as a SNF. 4. Patient has experienced substantial functional loss from his/her baseline which was documented above under the "Functional History" and "Functional Status" headings.  Judging by the patient's diagnosis, physical exam, and functional history, the patient has potential for functional progress which will result in measurable gains while on inpatient rehab.  These gains will be of substantial and practical use upon discharge  in facilitating mobility and self-care at the household level. 5. Physiatrist will provide 24 hour management of medical needs as well as oversight of the therapy plan/treatment and provide guidance as appropriate regarding the interaction of the two. 6. 24 hour rehab nursing will assist with bladder management, bowel management, safety, skin/wound care, disease management, medication administration, pain management and patient education  and help integrate therapy concepts, techniques,education, etc. 7. PT will assess and treat for:  Lower extremity strength, range of motion, stamina, balance, functional mobility, safety, adaptive techniques and equipment, NMR, cognitive perceptual awareness, visual spatial awareness, tone mgt.  Goals are: supervision to min assist. 8. OT will assess and treat for: ADL's, functional mobility, safety, upper extremity strength, adaptive techniques and equipment, NMR, cognitive perceptual awareness, visual spatial awareness, tone mgt.   Goals are: min to mod assist. 9. SLP will assess and treat for: cognition, swallowing and communication.  Goals  are: supervision to perhaps  Min assist. 10. Case Management and Social Worker will assess and treat for psychological issues and discharge planning. 11. Team conference will be held weekly to assess progress toward goals and to determine barriers to discharge. 12. Patient will receive at least 3 hours of therapy per day at least 5 days per week. 13. ELOS: 3+ weeks      Prognosis:  good   Medical Problem List and Plan: 1. Partially hemorrhagic right MCA infarct superimposed upon remote right brain infarct with dense left hemiparesthesias and visual spatial deficits 2. DVT Prophylaxis/Anticoagulation: Subcutaneous Lovenox. Monitor platelet counts and any signs of bleeding 3. Pain Management: Ultram as needed. Monitor with increased mobility 4. Neuropsych: This patient is not capable of making decisions on his/her own behalf. 5. Seizure disorder. Keppra 500 mg every 12  hours. EEG negative 6. Hypertension. Avapro 150 mg daily. Monitor with increased activity  Ranelle Oyster, MD, [place] [facility]   09/21/2012

## 2012-09-21 NOTE — Progress Notes (Signed)
Speech Language Pathology Treatment Patient Details Name: Stephanie Mason MRN: [id number] DOB: 1951/05/08 Today's Date: 09/21/2012 Time: 0950-1003 SLP Time Calculation (min): 13 min  Assessment / Plan / Recommendation Clinical Impression   Treatment focused on facilitation of cognitive-linguistic recovery and use of compensatory strategies for speech intelligibility including increased volume and overarticulation at the conversation level. Patient was observed reading a newspaper article and required mod-max verbal cues to use compensatory strategies, which were observed to be effective. Patient reported making notable mental efforts to get her left side of her body to do what she told it, and moderate verbal cueing was required for attention to left visual field during dysphagia tx. Patient would benefit from continued SLP f/u for cognition as well as resuming dysphagia goals, maintaing a dysphagia 3 diet, to facilitate increased safety of swallow and use of compensatory strategies.   SLP Plan  Continue with current plan of care    Pertinent Vitals/Pain None reported  SLP Goals  SLP Goals Potential to Achieve Goals: Good Progress/Goals/Alternative treatment plan discussed with pt/caregiver and they: Agree SLP Goal #2: Patient will increase speech intensity during a conversation by 50% with moderate verbal cues from therapist. SLP Goal #2 - Progress: Progressing toward goal SLP Goal #3: Patient will demonstrate awareness of objects in her left visual field with moderate verbal cues from therapist during a functional ADL. SLP Goal #3 - Progress: Progressing toward goal SLP Goal #4: Patient will complete functional problem solving tasks to completion with moderate varbal cues to maintain sustained attention SLP Goal #4 - Progress: Progressing toward goal  General Temperature Spikes Noted: No Respiratory Status: Room air Behavior/Cognition: Alert;Cooperative;Pleasant mood Oral Cavity -  Dentition: Adequate natural dentition Patient Positioning: Upright in bed  Oral Cavity - Oral Hygiene     Treatment Treatment focused on: Cognition;Dysarthria;Patient/family/caregiver education   GO   Berdine Dance SLP student  Berdine Dance 09/21/2012, 1:22 PM

## 2012-09-22 ENCOUNTER — Inpatient Hospital Stay (HOSPITAL_COMMUNITY)
Admission: RE | Admit: 2012-09-22 | Discharge: 2012-10-19 | DRG: 462 | Disposition: A | Discharge status: Skilled Nursing Facility | Payer: BC Managed Care – PPO | Source: Intra-hospital | Attending: Physical Medicine & Rehabilitation | Admitting: Physical Medicine & Rehabilitation

## 2012-09-22 ENCOUNTER — Encounter (HOSPITAL_COMMUNITY): Payer: Self-pay | Admitting: Gastroenterology

## 2012-09-22 ENCOUNTER — Encounter (HOSPITAL_COMMUNITY)
Admission: AD | Disposition: A | Discharge status: Rehabilitation Facility | Payer: Self-pay | Source: Other Acute Inpatient Hospital | Attending: Neurology

## 2012-09-22 DIAGNOSIS — G40909 Epilepsy, unspecified, not intractable, without status epilepticus: Secondary | ICD-10-CM | POA: Diagnosis present

## 2012-09-22 DIAGNOSIS — R1310 Dysphagia, unspecified: Secondary | ICD-10-CM | POA: Diagnosis present

## 2012-09-22 DIAGNOSIS — I10 Essential (primary) hypertension: Secondary | ICD-10-CM | POA: Diagnosis present

## 2012-09-22 DIAGNOSIS — I6789 Other cerebrovascular disease: Secondary | ICD-10-CM

## 2012-09-22 DIAGNOSIS — I6330 Cerebral infarction due to thrombosis of unspecified cerebral artery: Secondary | ICD-10-CM

## 2012-09-22 DIAGNOSIS — Z8673 Personal history of transient ischemic attack (TIA), and cerebral infarction without residual deficits: Secondary | ICD-10-CM

## 2012-09-22 DIAGNOSIS — M79609 Pain in unspecified limb: Secondary | ICD-10-CM | POA: Diagnosis present

## 2012-09-22 DIAGNOSIS — M79642 Pain in left hand: Secondary | ICD-10-CM | POA: Clinically undetermined

## 2012-09-22 DIAGNOSIS — Z79899 Other long term (current) drug therapy: Secondary | ICD-10-CM

## 2012-09-22 DIAGNOSIS — G936 Cerebral edema: Secondary | ICD-10-CM | POA: Diagnosis present

## 2012-09-22 DIAGNOSIS — I619 Nontraumatic intracerebral hemorrhage, unspecified: Secondary | ICD-10-CM | POA: Diagnosis present

## 2012-09-22 DIAGNOSIS — I6340 Cerebral infarction due to embolism of unspecified cerebral artery: Secondary | ICD-10-CM | POA: Diagnosis present

## 2012-09-22 DIAGNOSIS — Z5189 Encounter for other specified aftercare: Principal | ICD-10-CM

## 2012-09-22 DIAGNOSIS — G8190 Hemiplegia, unspecified affecting unspecified side: Secondary | ICD-10-CM | POA: Diagnosis present

## 2012-09-22 DIAGNOSIS — D7589 Other specified diseases of blood and blood-forming organs: Secondary | ICD-10-CM | POA: Diagnosis present

## 2012-09-22 DIAGNOSIS — I639 Cerebral infarction, unspecified: Secondary | ICD-10-CM

## 2012-09-22 HISTORY — PX: TEE WITHOUT CARDIOVERSION: SHX5443

## 2012-09-22 LAB — CBC
Hemoglobin: 12.9 g/dL (ref 12.0–15.0)
MCH: 33.7 pg (ref 26.0–34.0)
MCHC: 33.1 g/dL (ref 30.0–36.0)
Platelets: 250 10*3/uL (ref 150–400)
RDW: 12 % (ref 11.5–15.5)

## 2012-09-22 LAB — CREATININE, SERUM
Creatinine, Ser: 0.73 mg/dL (ref 0.50–1.10)
GFR calc non Af Amer: >90 mL/min (ref >90)

## 2012-09-22 SURGERY — ECHOCARDIOGRAM, TRANSESOPHAGEAL
Anesthesia: Moderate Sedation

## 2012-09-22 MED ORDER — ENOXAPARIN SODIUM 30 MG/0.3ML ~~LOC~~ SOLN: 30.0000 mg | SUBCUTANEOUS | Status: DC

## 2012-09-22 MED ORDER — TRAMADOL HCL 50 MG PO TABS
100.0000 mg | ORAL_TABLET | Freq: Four times a day (QID) | ORAL | Status: DC | PRN
  Administered 2012-09-25 – 2012-10-19 (×42): 100 mg via ORAL
  Filled 2012-09-22: qty 1
  Filled 2012-09-22 (×14): qty 2
  Filled 2012-09-22: qty 1
  Filled 2012-09-22 (×15): qty 2
  Filled 2012-09-22: qty 1
  Filled 2012-09-22 (×13): qty 2

## 2012-09-22 MED ORDER — ONDANSETRON HCL 4 MG/2ML IJ SOLN: 4.0000 mg | Freq: Four times a day (QID) | INTRAMUSCULAR | Status: DC | PRN

## 2012-09-22 MED ORDER — MIDAZOLAM HCL 5 MG/ML IJ SOLN
INTRAMUSCULAR | Status: AC
  Filled 2012-09-22: qty 2

## 2012-09-22 MED ORDER — LEVETIRACETAM 500 MG PO TABS
500.0000 mg | ORAL_TABLET | Freq: Two times a day (BID) | ORAL | Status: DC
  Filled 2012-09-22: qty 1

## 2012-09-22 MED ORDER — BUTAMBEN-TETRACAINE-BENZOCAINE 2-2-14 % EX AERO
INHALATION_SPRAY | CUTANEOUS | Status: DC | PRN
  Administered 2012-09-22: 2 via TOPICAL

## 2012-09-22 MED ORDER — LEVETIRACETAM 500 MG PO TABS
500.0000 mg | ORAL_TABLET | Freq: Two times a day (BID) | ORAL | Status: DC
  Administered 2012-09-22 – 2012-10-19 (×54): 500 mg via ORAL
  Filled 2012-09-22 (×57): qty 1

## 2012-09-22 MED ORDER — SENNOSIDES-DOCUSATE SODIUM 8.6-50 MG PO TABS
1.0000 | ORAL_TABLET | Freq: Every evening | ORAL | Status: DC | PRN
  Administered 2012-10-04: 1 via ORAL
  Filled 2012-09-22: qty 1

## 2012-09-22 MED ORDER — ASPIRIN 325 MG PO TABS
325.0000 mg | ORAL_TABLET | Freq: Every day | ORAL | Status: DC
  Administered 2012-09-22 – 2012-10-19 (×28): 325 mg via ORAL
  Filled 2012-09-22 (×30): qty 1

## 2012-09-22 MED ORDER — PANTOPRAZOLE SODIUM 40 MG PO TBEC
40.0000 mg | DELAYED_RELEASE_TABLET | Freq: Every day | ORAL | Status: DC
  Administered 2012-09-22 – 2012-10-19 (×28): 40 mg via ORAL
  Filled 2012-09-22 (×28): qty 1

## 2012-09-22 MED ORDER — LEVETIRACETAM 500 MG PO TABS: 500.0000 mg | ORAL_TABLET | Freq: Two times a day (BID) | ORAL | Status: DC

## 2012-09-22 MED ORDER — MENTHOL 3 MG MT LOZG
1.0000 | LOZENGE | OROMUCOSAL | Status: DC | PRN
  Filled 2012-09-22: qty 9

## 2012-09-22 MED ORDER — ENOXAPARIN SODIUM 40 MG/0.4ML ~~LOC~~ SOLN
40.0000 mg | SUBCUTANEOUS | Status: DC
  Administered 2012-09-22 – 2012-10-18 (×27): 40 mg via SUBCUTANEOUS
  Filled 2012-09-22 (×28): qty 0.4

## 2012-09-22 MED ORDER — FENTANYL CITRATE 0.05 MG/ML IJ SOLN
INTRAMUSCULAR | Status: DC | PRN
  Administered 2012-09-22 (×3): 25 ug via INTRAVENOUS

## 2012-09-22 MED ORDER — MIDAZOLAM HCL 10 MG/2ML IJ SOLN
INTRAMUSCULAR | Status: DC | PRN
  Administered 2012-09-22 (×2): 2 mg via INTRAVENOUS

## 2012-09-22 MED ORDER — IRBESARTAN 150 MG PO TABS
150.0000 mg | ORAL_TABLET | Freq: Every day | ORAL | Status: DC
  Administered 2012-09-22 – 2012-10-19 (×28): 150 mg via ORAL
  Filled 2012-09-22 (×29): qty 1

## 2012-09-22 MED ORDER — PHENOL 1.4 % MT LIQD
1.0000 | OROMUCOSAL | Status: DC | PRN
  Administered 2012-09-22 – 2012-09-24 (×5): 1 via OROMUCOSAL
  Filled 2012-09-22: qty 177

## 2012-09-22 MED ORDER — TRAMADOL HCL 50 MG PO TABS: 100.0000 mg | ORAL_TABLET | Freq: Four times a day (QID) | ORAL | Status: DC | PRN

## 2012-09-22 MED ORDER — ONDANSETRON HCL 4 MG PO TABS: 4.0000 mg | ORAL_TABLET | Freq: Four times a day (QID) | ORAL | Status: DC | PRN

## 2012-09-22 MED ORDER — ACETAMINOPHEN 325 MG PO TABS
325.0000 mg | ORAL_TABLET | ORAL | Status: DC | PRN
  Administered 2012-09-24 – 2012-10-18 (×17): 650 mg via ORAL
  Filled 2012-09-22 (×16): qty 2

## 2012-09-22 MED ORDER — FENTANYL CITRATE 0.05 MG/ML IJ SOLN
INTRAMUSCULAR | Status: AC
  Filled 2012-09-22: qty 2

## 2012-09-22 NOTE — CV Procedure (Signed)
    Transesophageal Echocardiogram Note  Stephanie Mason [id number] [birth year]  Procedure: Transesophageal Echocardiogram Indications: CVA  Procedure Details Consent: Obtained Time Out: Verified patient identification, verified procedure, site/side was marked, verified correct patient position, special equipment/implants available, Radiology Safety Procedures followed,  medications/allergies/relevent history reviewed, required imaging and test results available.  Performed  Medications: Fentanyl: 75 mcg IV Versed: 4 mg IV  Left Ventrical:  Normal LV function  Mitral Valve: trace MR  Aortic Valve: normal AV  Tricuspid Valve: moderate TV  Pulmonic Valve: no significant PI  Left Atrium/ Left atrial appendage:  LEft and right atrial enlargment.  Normal appendage. No thrombus  Atrial septum: no ASD or PFO.  Aorta: mild calcification.   Complications: No apparent complications Patient did tolerate procedure well.   Stephanie Mason, Stephanie Mason., MD, [facility] 09/22/2012, 11:30 AM

## 2012-09-22 NOTE — Discharge Summary (Signed)
Stroke Discharge Summary  Patient ID: Stephanie Mason   MRN: [id number]      DOB: [birth year]  Date of Admission: 09/15/2012 Date of Discharge: 09/22/2012  Attending Physician:  Darcella Cheshire, MD, Stroke MD  Consulting Physician(s):  Treatment Team:  Md Stroke, MD Faith Rogue, MD (Physical Medicine & Rehabtilitation)  Patient's PCP:  Hassan Rowan, MD  Discharge Diagnoses:  Active Problems:   Cerebral embolism with cerebral infarction - large R MCA infarct with hemorrhagic transformation and cytotoxic cerebral edema, received as drip and ship from [facility] where she received IV tPA  Hemiplegia, unspecified, affecting nondominant side  Seizure  Hypertension  Bochdalek hernia  Hx Stroke   BMI  Body mass index is 24.74 kg/(m^2).   Past Medical History  Diagnosis Date  . Stroke   . Hypertension    History reviewed. No pertinent past surgical history.  Medications to be continued on Rehab . [facility] HOLD] aspirin EC  325 mg Oral Daily   Or  . [facility] HOLD] aspirin  300 mg Rectal Daily  . [MAR HOLD] enoxaparin (LOVENOX) injection  40 mg Subcutaneous Q24H  . [MAR HOLD] irbesartan  150 mg Oral Daily  . [facility] HOLD] levetiracetam  500 mg Intravenous Q12H  . [facility] HOLD] pantoprazole  40 mg Oral Q1200   LABORATORY STUDIES CBC    Component Value Date/Time   WBC 4.2 09/20/2012 0540   RBC 3.90 09/20/2012 0540   HGB 13.0 09/20/2012 0540   HCT 39.4 09/20/2012 0540   PLT 220 09/20/2012 0540   MCV 101.0* 09/20/2012 0540   MCH 33.3 09/20/2012 0540   MCHC 33.0 09/20/2012 0540   RDW 12.0 09/20/2012 0540   LYMPHSABS 1.4 09/20/2012 0540   MONOABS 0.4 09/20/2012 0540   EOSABS 0.1 09/20/2012 0540   BASOSABS 0.0 09/20/2012 0540   CMP    Component Value Date/Time   NA 142 09/20/2012 0540   K 3.9 09/20/2012 0540   CL 107 09/20/2012 0540   CO2 25 09/20/2012 0540   GLUCOSE 102* 09/20/2012 0540   BUN 10 09/20/2012 0540   CREATININE 0.84 09/20/2012 0540   CALCIUM 9.0 09/20/2012 0540   GFRNONAA 75* 09/20/2012 0540    GFRAA 86* 09/20/2012 0540   COAGS No results found for this basename: INR, PROTIME   Lipid Panel    Component Value Date/Time   CHOL 152 09/15/2012 0430   TRIG 63 09/15/2012 0430   HDL 52 09/15/2012 0430   CHOLHDL 2.9 09/15/2012 0430   VLDL 13 09/15/2012 0430   LDLCALC 87 09/15/2012 0430   HgbA1C  Lab Results  Component Value Date   HGBA1C 5.3 09/15/2012   Cardiac Panel (last 3 results) No results found for this basename: CKTOTAL, CKMB, TROPONINI, RELINDX,  in the last 72 hours Urinalysis No results found for this basename: colorurine, appearanceur, labspec, phurine, glucoseu, hgbur, bilirubinur, ketonesur, proteinur, urobilinogen, nitrite, leukocytesur   Urine Drug Screen  No results found for this basename: labopia, cocainscrnur, labbenz, amphetmu, thcu, labbarb    Alcohol Level No results found for this basename: eth    SIGNIFICANT DIAGNOSTIC STUDIES CT of the brain  09/19/2012 1. Stable slightly decreased mass effect associated with the right ACA and MCA infarcts. Leftward midline shift now 4 mm. 2. Stable petechial hemorrhage. 3. No new intracranial abnormality.  09/17/2012 Large area progressing infarct in the right frontal and temporal lobes with a vague central hemorrhagic change better depicted on MRI. There is mass effect with increasing right  to left midline shift now approximately 7 mm.  09/15/2012 Large right hemispheric acute/subacute infarct with mass effect and possible minimal petechial hemorrhage. This finding appears to be superimposed upon a chronic right hemispheric infarct as indicated by prominence of the right sylvian fissure. Compression of the frontal horn of the right lateral ventricle and 1.9 mm of midline shift to the left.  MRI of the brain 09/16/2012 Large acute partially hemorrhagic right hemispheric infarct is superimposed upon remote infarct as detailed above. This causes mass effect upon the right lateral ventricle with midline shift to the left by 7.4 mm. Cerebellar  atrophy. Paranasal sinus mucosal thickening most notable maxillary sinuses and greater on the left  MRA of the brain 09/16/2012 Motion degraded examination limits evaluation as detailed above. What can be stated is that there is a decreased number of visualized right middle cerebral artery branch vessels consistent with the patient's acute and chronic infarct.  2D Echocardiogram Normal LV function, no source of embolus.  Carotid Doppler No evidence of hemodynamically significant internal carotid artery stenosis. Vertebral artery flow is antegrade.  TCD This was a normal transcranial Doppler study, with normal flow direction and velocity of all identified vessels of the anterior and posterior circulations, with no evidence of stenosis, vasospasm or occlusion. There was no evidence of intracranial disease.  TEE no source of embolus, no PFO CXR 09/15/2012 1. No active lung disease. 2. Rounded opacity at the left lung base may represent focal eventration, but a mass cannot be excluded.  CT Chest 09/16/2012 1. A fat containing Bochdalek hernia accounts for the questioned abnormality at the base of the left hemithorax on recent chest radiograph. 2. No acute findings.  EEG this is a normal awake EEG   History of Present Illness  Stephanie Mason is an 62 y.o. female who was in her normal state of health this evening 09/14/2012. She had something to drink and went to bed. She was last known well at 1900.  Shortly after going to bed complained that she was unable to move her left side. Her husband attempted to help her and noted that she was unable to use it as well. EMS was called at that time and patient was brought to [facility]. Seizure noted in ED and Ativan given. Patient was evaluated there (NIH stroke scale of 16 documented) and felt eligible for tPA. Patient was then transferred here for further management. Per RN report, she received tPA 09/14/2012 at 2348. Current NIH of 15. She was admitted to the neuro ICU  for further evaluation and treatment.  Patient has a history of a stroke on the right side of the brain that occurred about 22 years ago. Patient had fully recovered from that event.   Hospital Course  Patient was started on Keppra for seizure. EEG was unrevealing.  Will continue keppra and address maintenance at followup.  Patient tolerated tPA without complication. CT imaging at 18 hrs 40 mins shows right MCA infarct with petechial hemorrhagic transformation. MRI confirmed ischemic infarct in the right MCA, partially hemorrhagic with mass effect from cytotoxic cerebral edema and midline shift. Due to size of stroke, she was at risk for neurologic worsening. She was monitored in the ICU, then step down. She was monitored with serial scans, when stable, was transferred to the floor when peak edema had passed. Stroke was found to be embolic secondary to unknown etiology (TEE negative for source). Hemorrhagic transformation was insignificant and She was started on  aspirin 325 mg orally  every day for secondary stroke prevention.  CXR showed ? Mass. CT chest revealed a Bochdalek hernia.  Patient has resultant right gaze preference, left hemiparesis, hemisensory deficit. Physical therapy, occupational therapy and speech therapy evaluated patient. All agreed inpatient rehab is needed. Patient's family is/are supportive and can provide care at discharge. CIR bed is available today and patient will be transferred there.  Discharge Exam  Blood pressure 122/69, pulse 76, temperature 97.6 F (36.4 C), temperature source Oral, resp. rate 18, height 5\' 7"  (1.702 m), weight 71.668 kg (158 lb), SpO2 96.00%. The patient is alert and cooperative at the time of the examination. Flat affect.  Respiratory examination is clear.  Cardiovascular examination reveals a regular rate and rhythm, no obvious murmurs or rubs are noted.  Abdomen reveals positive bowel sounds, no organomegaly or tenderness noted.  Extremities are  without significant edema.  Neurologic examination reveals that the patient has a depression of the left nasolabial fold.  The patient has full extraocular movements, and visual fields are full, with the exception that there appears to be a left inferior quadrantopia.  Speech is not aphasic, slightly dysarthric.  The patient has significant weakness of the left arm and left leg, without voluntary motor movements. The patient has no neglect of the left side.  The patient reports symmetric soft touch sensation on one side to the next.  He can reflexes are relatively symmetric, slightly brisk in the legs, left Babinski is seen.  The patient is able to perform finger-nose-finger and heel-to-shin on the right side, not on the left. The patient could not be ambulated.  Discharge Diet    Dysphagia 3 thin liquids  Discharge Plan  Disposition:  Transfer to [facility] for ongoing PT, OT and ST  aspirin 325 mg orally every day for secondary stroke prevention.  Ongoing risk factor control by Primary Care Physician. Risk factor recommendations:  Hypertension target range 130-140/70-80  Continue Keppra. Dr. Pearlean Brownie will address at follow up with him She will need outpatient telemetry monitoring to assess patient for atrial fibrillation as source of stroke. May be arranged with patient's cardiologist, or cardiologist of choice.  Will order hypercoagulable and vasculitic labs to look for possible causes of stroke. Dr. Pearlean Brownie will follow up as an OP. Rehab may contact Stroke Team for guidance for any positive results.  Follow-up Hassan Rowan, MD in 1 month.  Follow-up with Dr. Delia Heady in 2 months.  30 minutes were spent preparing discharge.  Signed Annie Main, AVNP, ANP-BC, [facility] Stroke Center Nurse Practitioner 09/22/2012, 12:40 PM  I have personally examined this patient, reviewed pertinent data and developed the plan of care. I agree with above.   Delia Heady, MD Medical  Director [facility] St[facility] Pager: [phone] 09/23/2012 9:17 AM

## 2012-09-22 NOTE — Plan of Care (Signed)
Overall Plan of Care [facility]) Patient Details Name: Agustina Witzke MRN: [id number] DOB: 03-27-1951  Diagnosis:  Rehabilitation for right MCA infarct  Co-morbidities: left flaccid hemiplegia, left homonymous hemianopsia and, left visual perceptual neglect,  Functional Problem List  Patient demonstrates impairments in the following areas: Bladder, Medication Management, Pain, Safety and Skin Integrity  Basic ADL's: eating, grooming, bathing, dressing and toileting Advanced ADL's: simple meal preparation  Transfers:  bed mobility, bed to chair, toilet, tub/shower, car and furniture Locomotion:  wheelchair mobility  Additional Impairments:  Functional use of upper extremity, Swallowing, Communication  expression and Social Cognition   problem solving, attention and awareness  Anticipated Outcomes Item Anticipated Outcome  Eating/Swallowing  Supervision/ Set up  Basic self-care    Tolieting    Bowel/Bladder  Pt will be continent of bladder and bowel with mod assist  Transfers    Locomotion    Communication  Supervision  Cognition  Supervision-Min assist  Pain  Pt will rate pain less than 3 on scale of 0-10 with min asssit  Safety/Judgment  Pt will remain free from falls during with min assist  Other  Skin will remain free from breakdown during admission with min assist   Therapy Plan: PT Intensity: Minimum of 1-2 x/day ,45 to 90 minutes PT Frequency: 5 out of 7 days PT Duration Estimated Length of Stay: 3-4 weeks OT Intensity: Minimum of 1-2 x/day, 45 to 90 minutes OT Frequency: 5 out of 7 days OT Duration/Estimated Length of Stay: 3 weeks SLP Intensity: Minumum of 1-2 x/day, 30 to 90 minutes SLP Frequency: 5 out of 7 days SLP Duration/Estimated Length of Stay: 3-4 weeks    Team Interventions: Item RN PT OT SLP SW TR Other  Self Care/Advanced ADL Retraining   x      Neuromuscular Re-Education   x      Therapeutic Activities   x x     UE/LE Strength Training/ROM    x      UE/LE Coordination Activities   x      Visual/Perceptual Remediation/Compensation   x      DME/Adaptive Equipment Instruction   x      Therapeutic Exercise   x x     Balance/Vestibular Training   x      Patient/Family Education   x x     Cognitive Remediation/Compensation   x x     Functional Mobility Training         Ambulation/Gait Programmer, applications Propulsion/Positioning         xFunctional Statistician   x      Community Reintegration   x      Dysphagia/Aspiration Precaution Training    x     Speech/Language Facilitation    x     Bladder Management x        Bowel Management x        Disease Management/Prevention x        Pain Management x  x      Medication Management x   x     Skin Care/Wound Management x        Splinting/Orthotics   x      Discharge Planning   x x     Psychosocial Support x  x  Team Discharge Planning: Destination: PT-Home ,OT- Home , SLP-Home Projected Follow-up: PT-Home health PT, OT-  Home health OT, SLP-Home Health SLP;24 hour supervision/assistance Projected Equipment Needs: PT-Wheelchair (measurements);Wheelchair cushion (measurements);Other (comment) (DME assessment ongoing, continue to assess), OT- 3 in 1 bedside comode;Tub/shower bench, SLP-None recommended by SLP Patient/family involved in discharge planning: PT- Patient,  OT-Patient, SLP-Patient  MD ELOS: 3 week Medical Rehab Prognosis:  Good Assessment: 62 year old female active and working full-time who sustained large right MCA distribution infarct and is now requiring 24 7 rehabilitation RN and M.D. As well as CIR level PT and OT. Tumoral focus on ADLs, mobility, visual perceptual skills, neuromuscular reeducation.    See Team Conference Notes for weekly updates to the plan of care

## 2012-09-22 NOTE — Care Management Note (Signed)
    Page 1 of 1   09/22/2012     4:36:44 PM   CARE MANAGEMENT NOTE 09/22/2012  Patient:  Stephanie Mason, Stephanie Mason   Account Number:  [id number]  Date Initiated:  09/22/2012  Documentation initiated by:  Jacquelynn Cree  Subjective/Objective Assessment:   admitted with CVA     Action/Plan:   PT/OT evals-recommended inpatient reahb   Anticipated DC Date:  09/22/2012   Anticipated DC Plan:  IP REHAB FACILITY      DC Planning Services  CM consult      PAC Choice  IP REHAB   Choice offered to / List presented to:             Status of service:  Completed, signed off Medicare Important Message given?   (If response is "NO", the following Medicare IM given date fields will be blank) Date Medicare IM given:   Date Additional Medicare IM given:    Discharge Disposition:  IP REHAB FACILITY  Per UR Regulation:  Reviewed for med. necessity/level of care/duration of stay  If discussed at Long Length of Stay Meetings, dates discussed:   09/22/2012    Comments:

## 2012-09-22 NOTE — Progress Notes (Addendum)
Stroke Team Progress Note  HISTORY Stephanie Mason is an 62 y.o. female who was in her normal state of health this evening 09/14/2012. She had something to drink and went to bed. Shortly after going to bed complained that she was unable to move her left side. Her husband attempted to help her and noted that she was unable to use it as well. EMS was called at that time and patient was brought to [facility]. Seizure noted in ED and Ativan given. Patient was evaluated there (NIH stroke scale of 16 documented) and felt eligible for tPA. Patient was then transferred here for further management. Patient has a history of a stroke on the right side of the brain that occurred about 22 years ago. Patient had fully recovered from that event. Current NIH of 15. She was admitted to the neuro ICU for further evaluation and treatment.  SUBJECTIVE No complaints. Patient in the endo lab awaiting TEE.  OBJECTIVE Most recent Vital Signs: Filed Vitals:   09/21/12 2217 09/22/12 0140 09/22/12 0607 09/22/12 1014  BP: 163/89 144/76 146/67 136/68  Pulse: 78 114 52 54  Temp: 99.7 F (37.6 C) 98.2 F (36.8 C) 97.8 F (36.6 C) 97.8 F (36.6 C)  TempSrc: Oral Oral Oral Oral  Resp: 20 20 18 16   Height:    5\' 7"  (1.702 m)  Weight:    71.668 kg (158 lb)  SpO2: 98% 91% 97% 94%   CBG (last 3)  No results found for this basename: GLUCAP,  in the last 72 hours  IV Fluid Intake:  . sodium chloride 75 mL/hr at 09/22/12 0625  . sodium chloride 500 mL (09/22/12 1018)   MEDICATIONS  . [facility] HOLD] aspirin EC  325 mg Oral Daily   Or  . [facility] HOLD] aspirin  300 mg Rectal Daily  . [MAR HOLD] enoxaparin (LOVENOX) injection  40 mg Subcutaneous Q24H  . [MAR HOLD] irbesartan  150 mg Oral Daily  . [facility] HOLD] levetiracetam  500 mg Intravenous Q12H  . [facility] HOLD] pantoprazole  40 mg Oral Q1200   PRN:  [MAR HOLD] acetaminophen, [MAR HOLD] acetaminophen, [MAR HOLD] bisacodyl, [MAR HOLD] labetalol, [MAR HOLD] ondansetron (ZOFRAN)  IV, [MAR HOLD] senna-docusate, [MAR HOLD] sodium chloride, [MAR HOLD] traMADol  Diet:  NPO  For TEE Activity:  As tolerated DVT Prophylaxis:  SCDs   CLINICALLY SIGNIFICANT STUDIES Basic Metabolic Panel:   Recent Labs Lab 09/16/12 1024 09/20/12 0540  NA 140 142  K 4.4 3.9  CL 105 107  CO2 23 25  GLUCOSE 99 102*  BUN 10 10  CREATININE 0.76 0.84  CALCIUM 9.3 9.0   Liver Function Tests: No results found for this basename: AST, ALT, ALKPHOS, BILITOT, PROT, ALBUMIN,  in the last 168 hours CBC:   Recent Labs Lab 09/20/12 0540  WBC 4.2  NEUTROABS 2.3  HGB 13.0  HCT 39.4  MCV 101.0*  PLT 220   Coagulation: No results found for this basename: LABPROT, INR,  in the last 168 hours Cardiac Enzymes: No results found for this basename: CKTOTAL, CKMB, CKMBINDEX, TROPONINI,  in the last 168 hours Urinalysis: No results found for this basename: COLORURINE, APPERANCEUR, LABSPEC, PHURINE, GLUCOSEU, HGBUR, BILIRUBINUR, KETONESUR, PROTEINUR, UROBILINOGEN, NITRITE, LEUKOCYTESUR,  in the last 168 hours  Lipid Panel    Component Value Date/Time   CHOL 152 09/15/2012 0430   TRIG 63 09/15/2012 0430   HDL 52 09/15/2012 0430   CHOLHDL 2.9 09/15/2012 0430   VLDL 13 09/15/2012 0430  LDLCALC 87 09/15/2012 0430   HgbA1C  Lab Results  Component Value Date   HGBA1C 5.3 09/15/2012   Urine Drug Screen:   No results found for this basename: labopia,  cocainscrnur,  labbenz,  amphetmu,  thcu,  labbarb    Alcohol Level: No results found for this basename: ETH,  in the last 168 hours  CT of the brain  09/19/2012  1. Stable slightly decreased mass effect associated with the right  ACA  and MCA infarcts. Leftward midline shift now 4 mm.  2. Stable petechial hemorrhage.  3. No new intracranial abnormality.   09/17/2012 Large area progressing infarct in the right frontal and temporal lobes with a vague central hemorrhagic change better depicted on MRI. There is mass effect with increasing right to left midline shift  now approximately 7 mm.  09/15/2012  Large right hemispheric acute/subacute infarct with mass effect and possible minimal petechial hemorrhage.  This finding appears to be superimposed upon a chronic right hemispheric infarct as indicated by prominence of the right sylvian fissure.  Compression of the frontal horn of the right lateral ventricle and 1.9 mm of midline shift to the left.   MRI of the brain 09/16/2012  Large acute partially hemorrhagic right hemispheric infarct is superimposed upon remote infarct as detailed above.  This causes mass effect upon the right lateral ventricle with midline shift to the left by 7.4 mm.  Cerebellar atrophy.  Paranasal sinus mucosal thickening most notable maxillary sinuses and greater on the left     MRA of the brain 09/16/2012    Motion degraded examination limits evaluation as detailed above. What can be stated is that there is a decreased number of visualized right middle cerebral artery branch vessels consistent with the patient's acute and chronic infarct.   2D Echocardiogram  Normal LV function, no source of embolus.   Carotid Doppler  No evidence of hemodynamically significant internal carotid artery stenosis. Vertebral artery flow is antegrade.   TCD  This was a normal transcranial Doppler study, with normal flow direction and velocity of all identified vessels of the anterior and posterior circulations, with no evidence of stenosis, vasospasm or occlusion. There was no evidence of intracranial disease.    CXR  09/15/2012   1.  No active lung disease. 2.  Rounded opacity at the left lung base may represent focal eventration, but a mass cannot be excluded.   CT Chest 09/16/2012 1. A fat containing Bochdalek hernia accounts for the questioned abnormality at the base of the left hemithorax on recent chest radiograph. 2. No acute findings.  EEG this is a normal awake EEG   Therapy Recommendations CIR  Physical Exam   The patient is alert and cooperative at the  time of the examination. Flat affect. Respiratory examination is clear. Cardiovascular examination reveals a regular rate and rhythm, no obvious murmurs or rubs are noted. Abdomen reveals positive bowel sounds, no organomegaly or tenderness noted. Extremities are without significant edema. Neurologic examination reveals that the patient has a depression of the left nasolabial fold. The patient has full extraocular movements, and visual fields are full, with the exception that there appears to be a left inferior quadrantopia.  Speech is not aphasic, slightly dysarthric. The patient has significant weakness of the left arm and left leg, without voluntary motor movements. The patient has no neglect of the left side. The patient reports symmetric soft touch sensation on one side to the next. He can reflexes are relatively symmetric, slightly brisk  in the legs, left Babinski is seen. The patient is able to perform finger-nose-finger and heel-to-shin on the right side, not on the left. The patient could not be ambulated.  .ASSESSMENT [name] is a 62 y.o. female presenting with left hemiparesis. Status post IV t-PA 09/14/2012 at 2348 at [facility]. She also had a seizure in their ED. Imaging confirms a large partially hemorrhagic right MCA infarct superimposed upon remote right brain infarct with cytotoxic cerebral edema and 7.85mm of midline shift. Now beyond the window for neurologic worsening from cerebral edema. Infarct embolic secondary to unknown etiology. On aspirin 81 mg orally every day prior to admission. Now on aspirin 325 mg orally every day for secondary stroke prevention. Patient with resultant right gaze preference, left hemiparesis, hemisensory deficit. Work up underway. CIR recommended.  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   Continue Keppra TEE to look for embolic source. Arranged with [facility]. If positive for PFO (patent foramen ovale), check bilateral lower extremity venous dopplers to rule out DVT as possible source of stroke.  Transfer to rehab post TEE if bed available Will schedule outpatient telemetry monitoring to assess patient for atrial fibrillation as source of stroke.   Annie Main, MSN, RN, ANVP-BC, ANP-BC, [facility] Pager: [phone] 09/22/2012 11:13 AM  I have personally obtained a history, examined the patient, evaluated imaging results, and formulated the assessment and plan of care. I agree with the above. Delia Heady, MD Medical Director [facility] Pager: [phone] 09/22/2012 11:13 AM

## 2012-09-22 NOTE — Progress Notes (Signed)
Pt admitted to room 4034, pt alert and oriented, pt oriented to rehab unit, safety plan, rehab schedule, pt verbalized an understanding and room, call bell in reach, bed alarm on

## 2012-09-22 NOTE — Interval H&P Note (Signed)
History and Physical Interval Note:  09/22/2012 11:17 AM  Stephanie Mason  has presented today for surgery, with the diagnosis of stroke  The various methods of treatment have been discussed with the patient and family. After consideration of risks, benefits and other options for treatment, the patient has consented to  Procedure(s): TRANSESOPHAGEAL ECHOCARDIOGRAM (TEE) (N/A) as a surgical intervention .  The patient's history has been reviewed, patient examined, no change in status, stable for surgery.  I have reviewed the patient's chart and labs.  Questions were answered to the patient's satisfaction.     Elyn Aquas.

## 2012-09-22 NOTE — PMR Pre-admission (Signed)
PMR Admission Coordinator Pre-Admission Assessment  Patient: Stephanie Mason is an 62 y.o., female MRN: [id number] DOB: 01-12-1951 Height: 5\' 7"  (170.2 cm) Weight: 71.668 kg (158 lb)              Insurance Information HMO:    PPO: yes       PCP:       IPA:       80/20:       OTHER:   PRIMARY: [employer] [place] [[place] [employer]]      Policy#: [id number]      Subscriber: pt  CM   Phone#: [phone] opt 2, opt 5, opt 4, opt 4, opt 2, opt 4  Speak w/ whomever is available-will take updates only by phone   First update due 2/20      Pre-Cert#: [id number]      Employer: [employer] [[employer]]  Benefits:  Phone #: [phone]     Name: Rica Koyanagi. Date: 08-12-2012     Deduct: $3000.00 [none met, however, $750.00 in a HRA will go toward this after the first bill]      Out of Pocket Max: $2000.00      Life Max: none CIR: 80% deduct      SNF: 80% after deduct [limit 100 days/ calendar year] Outpatient: 80% after deduct     Co-Pay: 20% after deduct 30 visits max Home Health: 80% after deduct      Co-Pay: 20% after deduct [100 visits w/ preauth] DME: 80% after deduct     Co-Pay: 20% after deduct Providers: [phone], [phone]     Emergency Contact Information Contact Information   Name Relation Home Work Mobile   Lumpkin,Dinah Clovis Riley       Daughter                      Significant Other [phone]           [phone]       Current Medical History  Patient Admitting Diagnosis:  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 Admitted 09/15/2012 to [facility] with left-sided weakness. Patient with noted seizure in the ED and received Ativan as well as loaded with Keppra. MRI of the brain showed large acute partially hemorrhagic right hemispheric infarct superimposed  upon remote infarct. Patient did receive TPA. Echocardiogram with normal left ventricular function no source of embolus. EEG showed no seizure activity. Carotid Dopplers with no ICA stenosis. Patient was transferred to [facility] for ongoing evaluation. Placed on aspirin therapy for stroke prophylaxis as well as subcutaneous Lovenox for DVT prophylaxis. Maintained on a dysphagia 3 thin liquid diet. Chest x-ray with questionable mass that was ruled out with CT of the chest. Physical and occupational therapy evaluations completed 09/17/2012 with recommendations of physical medicine rehabilitation consult to consider inpatient rehabilitation services  Total: 13    Past Medical History  Past Medical History  Diagnosis Date  . Stroke   . Hypertension     Family History  family history is not on file.  Prior Rehab/Hospitalizations:  OP txs after her first stroke @ 22 yrs ago  Current Medications  Current facility-administered medications:0.9 %  sodium chloride infusion, , Intravenous, Continuous, Thana Farr, MD, Last Rate: 75  mL/hr at 09/22/12 0625;  [facility] HOLD] acetaminophen (TYLENOL) suppository 650 mg, 650 mg, Rectal, Q4H PRN, Thana Farr, MD;  [facility] HOLD] acetaminophen (TYLENOL) tablet 650 mg, 650 mg, Oral, Q4H PRN, Thana Farr, MD, 650 mg at 09/20/12 1407 [[facility] HOLD] aspirin EC tablet 325 mg, 325 mg, Oral, Daily, Cathlyn Parsons, PA-C, 325 mg at 09/21/12 1028;  [[facility] HOLD] aspirin suppository 300 mg, 300 mg, Rectal, Daily, Cathlyn Parsons, PA-C;  [facility] HOLD] bisacodyl (DULCOLAX) suppository 10 mg, 10 mg, Rectal, Daily PRN, York Spaniel, MD, 10 mg at 09/20/12 1355;  [[facility] HOLD] enoxaparin (LOVENOX) injection 40 mg, 40 mg, Subcutaneous, Q24H, Layne Benton, NP, 40 mg at 09/21/12 1028 [[facility] HOLD] irbesartan (AVAPRO) tablet 150 mg, 150 mg, Oral, Daily, Layne Benton, NP, 150 mg at 09/21/12 1028;  [[facility] HOLD] labetalol (NORMODYNE,TRANDATE) injection 10 mg, 10 mg, Intravenous, Q10 min PRN,  Thana Farr, MD;  [facility] HOLD] levETIRAcetam (KEPPRA) 500 mg in sodium chloride 0.9 % 100 mL IVPB, 500 mg, Intravenous, Q12H, Thana Farr, MD, 500 mg at 09/22/12 0016 [facility] HOLD] ondansetron (ZOFRAN) injection 4 mg, 4 mg, Intravenous, Q6H PRN, Thana Farr, MD;  [facility] HOLD] pantoprazole (PROTONIX) EC tablet 40 mg, 40 mg, Oral, Q1200, Micki Riley, MD, 40 mg at 09/21/12 1208;  [[facility] HOLD] senna-docusate (Senokot-S) tablet 1 tablet, 1 tablet, Oral, QHS PRN, Thana Farr, MD;  [facility] HOLD] sodium chloride (OCEAN) 0.65 % nasal spray 1 spray, 1 spray, Each Nare, BID PRN, Layne Benton, NP, 1 spray at 09/18/12 1026 [[facility] HOLD] traMADol (ULTRAM) tablet 100 mg, 100 mg, Oral, Q6H PRN, Layne Benton, NP, 100 mg at 09/22/12 0400  Patients Current Diet:  D3 thin w/ S-check for L. 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 Devices/Equipment: None  Prior Functional Level Prior Function Level of Independence: Independent Able to Take Stairs?: Yes Driving: Yes Vocation: Full time employment  Current Functional Level Cognition  Arousal/Alertness: Awake/alert Overall Cognitive Status: Impaired Overall Cognitive Status: Impaired Current Attention Level: Sustained Orientation Level: Oriented X4 Safety/Judgement: Decreased awareness of safety precautions Awareness of Errors: Assistance required to identify errors made;Assistance required to correct errors made Awareness of Errors - Other Comments: Pt without awareness to objects on left side of tray with max questioning cues Attention: Sustained (possibly due to extreme lethargy and discomfort in left arm) Sustained Attention: Impaired Sustained Attention Impairment: Verbal  complex Memory: Impaired Memory Impairment: Storage deficit Awareness: Appears intact    Extremity Assessment (includes Sensation/Coordination)  RUE ROM/Strength/Tone: Within functional levels RUE Sensation: WFL - Light Touch RUE Coordination: WFL - gross/fine motor  RLE ROM/Strength/Tone: Within functional levels RLE Sensation: WFL - Light Touch RLE Coordination: WFL - gross/fine motor    ADLs  Eating/Feeding: Set up (noticeable left side pocketing and only attending to Right ) Where Assessed - Eating/Feeding: Bed level Grooming: Moderate assistance Where Assessed - Grooming: Supported sitting Toilet Transfer: +1 Total assistance;Performed Toilet Transfer: Patient Percentage: 60% Statistician Method: Sit to Barista: Bedside commode (simulated EOB) Toileting - Clothing Manipulation and Hygiene: +2 Total assistance;Performed Toileting - Architect and Hygiene: Patient Percentage: 30% Where Assessed - Glass blower/designer Manipulation and Hygiene: Other (comment) (sit to stand at the edge of the bed) Equipment Used: Gait belt Transfers/Ambulation Related to ADLs: Pt required  total assist for stand step up the side of the bed.  Total assist to move the LLE as well. ADL Comments: Pt transitioned to EOB with total assist and max instructional cueing to sequence transfer.  Worked on static and dynamic sitting balance.  Pt overall needs mod assist for static sitting.  Frequent LOB to the left with the need for therapist to help her regain it.  She is able to state and initiate LOB but sometimes her reaction to selfcorrect is delayed.  Performed sit to stand times 2 as well with total assist to maintain balance and pt leaning to the right side.  Pt reported pain on 2 occasions in the left shoulder in sitting  but PROM in supine WFLS without any pain.  Pt needing max instructional cueing to scan her left visual field to locate items for reaching on her bedside  table.    Mobility  Bed Mobility: Rolling Right;Right Sidelying to Sit Rolling Right: 2: Max assist Rolling Left: 4: Min guard Right Sidelying to Sit: HOB flat;2: Max assist Supine to Sit: 2: Max assist Supine to Sit: Patient Percentage: 40% Sitting - Scoot to [place] of Bed: 2: Max assist    Transfers  Transfers: Not assessed Sit to Stand: 1: +1 Total assist;From bed Sit to Stand: Patient Percentage: 60% Stand to Sit: 1: +1 Total assist Stand to Sit: Patient Percentage: 60% Stand Pivot Transfers: 1: +2 Total assist Stand Pivot Transfers: Patient Percentage: 50%    Ambulation / Gait / Stairs / Wheelchair Mobility  Ambulation/Gait Ambulation/Gait Assistance: Not tested (comment)    Posture / Balance Static Sitting Balance Static Sitting - Balance Support: Right upper extremity supported Static Sitting - Level of Assistance: 3: Mod assist Static Sitting - Comment/# of Minutes: able to sit for 10-15 seconds before LOB Dynamic Sitting Balance Dynamic Sitting - Balance Support: Right upper extremity supported Dynamic Sitting - Level of Assistance: 2: Max assist Dynamic Sitting Balance - Compensations: cues to look upright and to line upright with verticle of window, pt tends to look to R.  Reach (Patient is able to reach ___ inches to right, left, forward, back): pt practiced leaning to left and right while RUE is supported by PT. Dynamic Sitting - Balance Activities: Lateral lean/weight shifting;Forward lean/weight shifting Static Standing Balance Static Standing - Balance Support: No upper extremity supported;During functional activity Static Standing - Level of Assistance: 1: +2 Total assist    Special needs/care consideration BiPAP/CPAP no CPM no Continuous Drip IV no Dialysis no         Life Vest no Oxygen no Special Bed no Trach Size no Wound Vac (area) no       Skin no issues                             Bowel mgmt: incontinent LBM 09/20/12 Bladder mgmt:  incontinent Diabetic mgmt no     Previous Home Environment Living Arrangements: Spouse/significant other Lives With: Significant other Available Help at Discharge: Available 24 hours/day (bf plans to retire to care for her, if necessary) Type of Home: House Home Layout: One level Home Access: Stairs to enter (bf says can put up ramp if needed) Entrance Stairs-Rails: Can reach both (front entrance) Entrance Stairs-Number of Steps: 3 (side entrance has 4 steps & L. rail) Home Care Services: No  Discharge Living Setting Plans for Discharge Living Setting: House (bf's house) Do you have any problems obtaining your  medications?: No  Social/Family/Support Systems Patient Roles: Partner;Parent Contact Information: pt's cell [phone] Anticipated Caregiver: bf, Sampson Si Anticipated Caregiver's Contact Information: home ph# [phone] Ability/Limitations of Caregiver: S-Min Assist Caregiver Availability: 24/7 Discharge Plan Discussed with Primary Caregiver: Yes Is Caregiver In Agreement with Plan?: Yes Does Caregiver/Family have Issues with Lodging/Transportation while Pt is in Rehab?: No    Goals/Additional Needs Patient/Family Goal for Rehab: S-Min A Expected length of stay: 3-4 weeks Pt/Family Agrees to Admission and willing to participate: Yes Program Orientation Provided & Reviewed with Pt/Caregiver Including Roles  & Responsibilities: Yes   Decrease burden of Care through IP rehab admission: Specialzed equipment needs, Decrease number of caregivers, Bowel and bladder program and Patient/family education   Possible need for SNF placement upon discharge:  hope to avoid   Patient Condition: Please see physician update to information in consult dated 09/18/12.  Pt continues appropriate for CIR.  Required TEE to be done before d/c to CIR.  Preadmission Screen Completed By:  Brock Ra, 09/22/2012 1:04  PM ______________________________________________________________________   Discussed status with Dr. Riley Kill on 09/22/12 at 1:03pm and received telephone approval for admission today.  Admission Coordinator:  Brock Ra, time 1:03pm/Date 09/22/12

## 2012-09-22 NOTE — Interval H&P Note (Signed)
Stephanie Mason was admitted today to Inpatient Rehabilitation with the diagnosis of large, hemorrhagic, thrombotic, right hemisphere infarct.  The patient's history has been reviewed, patient examined, and there is no change in status.  Patient continues to be appropriate for intensive inpatient rehabilitation.  I have reviewed the patient's chart and labs.  Questions were answered to the patient's satisfaction.  Tyquez Hollibaugh T 09/22/2012, 4:18 PM

## 2012-09-22 NOTE — H&P (View-Only) (Signed)
Physical Medicine and Rehabilitation Admission H&P    No chief complaint on file. : HPI: Stephanie Mason is a 61 y.o. 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 Admitted 09/15/2012 to [facility] with left-sided weakness. Patient with noted seizure in the ED and received Ativan as well as loaded with Keppra. MRI of the brain showed large acute partially hemorrhagic right hemispheric infarct superimposed upon remote infarct. Patient did receive TPA. Echocardiogram with normal left ventricular function no source of embolus. EEG showed no seizure activity. Carotid Dopplers with no ICA stenosis. TCD with right terminal ICA stenosis. Patient was transferred to [facility] for ongoing evaluation. Placed on aspirin therapy for stroke prophylaxis as well as subcutaneous Lovenox for DVT prophylaxis. TEE completed 09/22/2012 showing no PFO or thrombus. Maintained on a dysphagia 3 thin liquid diet. Chest x-ray with questionable mass that was ruled out with CT of the chest. Physical and occupational therapy evaluations completed 09/17/2012 with recommendations of physical medicine rehabilitation consult to consider inpatient rehabilitation services  Review of Systems  Neurological: Positive for dizziness and seizures.  All other systems reviewed and are negative   Past Medical History  Diagnosis Date  . Stroke   . Hypertension    History reviewed. No pertinent past surgical history. History reviewed. No pertinent family history. Social History:  reports that she has never smoked. She does not have any smokeless tobacco history on file. She reports that she does not drink alcohol or use illicit drugs. Allergies: No Known Allergies Medications Prior to Admission  Medication Sig Dispense Refill  . telmisartan (MICARDIS) 40 MG tablet Take 40 mg by mouth daily.        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[employer]))  Functional Status:  Mobility: Bed Mobility Bed Mobility: Supine to Sit;Sitting - Scoot to Edge of Bed Supine to Sit: 1: +2 Total assist;HOB flat Supine to Sit: Patient Percentage: 40% Sitting - Scoot to Edge of Bed: 2: Max assist Transfers Transfers: Sit to Stand;Stand to Sit Sit to Stand: 1: +2 Total assist;From bed Sit to Stand: Patient Percentage: 60% Stand to Sit: 1: +2 Total assist;To chair/3-in-1 Stand to Sit: Patient Percentage: 60% Stand Pivot Transfers: 1: +2 Total assist Stand Pivot Transfers: Patient Percentage: 50% Ambulation/Gait Ambulation/Gait Assistance: Not tested (comment)    ADL: ADL Eating/Feeding: Set up (noticeable left side pocketing and only attending to Right ) Where Assessed - Eating/Feeding: Bed level Grooming: Moderate assistance Where Assessed - Grooming: Supported sitting Toilet Transfer: +2 Total assistance Toilet Transfer Equipment: Regular height toilet Equipment Used: Gait belt Transfers/Ambulation Related to ADLs: Pt with Lt LE hyperextension during transfer. Pt required total (A) to advance Lt LE. Pt stepping with Rt LE. Pt's extensor tone assisting with static standing.  ADL Comments: Pt with decorticate posturing with Lt UE flexor tone and lt le extension. Pt lacks awareness to Lt UE positioning and high risk for injury. pt required (A) to reposition LT UE.Pt moving Rt UE with v/c. Pt with pain response at nail bed of Lt LE. Pt reports no numbness in Lt UE/ LE at this time. Pt attending to rt side and tracks left with mod v/c. pt eating half of breakfast and no awareness to left side of plate.   Cognition: Cognition Overall Cognitive Status: Impaired Arousal/Alertness: Awake/alert Orientation Level: Oriented X4 Attention: Sustained (  possibly due to extreme lethargy and discomfort in left arm) Sustained Attention: Impaired Sustained  Attention Impairment: Verbal complex Memory: Impaired Memory Impairment: Storage deficit Awareness: Appears intact Cognition Overall Cognitive Status: Impaired Area of Impairment: Attention;Memory;Safety/judgement;Awareness of errors;Awareness of deficits;Problem solving Arousal/Alertness: Awake/alert Orientation Level: Appears intact for tasks assessed Behavior During Session: WFL for tasks performed Current Attention Level: Selective Safety/Judgement: Decreased safety judgement for tasks assessed;Decreased awareness of safety precautions Awareness of Errors: Assistance required to identify errors made;Assistance required to correct errors made Awareness of Errors - Other Comments: Pt without awareness to objects on left side of tray with max questioning cues Problem Solving: Pt needs assistance to correct errors and recognition of deficits   Blood pressure 145/67, pulse 57, temperature 97.9 F (36.6 C), temperature source Oral, resp. rate 18, height 5' 7" (1.702 m), weight 69.3 kg (152 lb 12.5 oz), SpO2 97.00%. Physical Exam  Vitals reviewed.  Constitutional: She appears well-developed and well-nourished.  HENT:  Head: Normocephalic and atraumatic.  Right Ear: External ear normal.  Left Ear: External ear normal.  Eyes: Conjunctivae normal are normal.  Pupils reactive to light  Neck: Normal range of motion. Neck supple. No JVD present. No tracheal deviation present. No thyromegaly present.  Cardiovascular: Normal rate and regular rhythm. No murmur Pulmonary/Chest: Effort normal and breath sounds normal. No respiratory distress.  Abdominal: Soft. Bowel sounds are normal. She exhibits no distension. There is no tenderness.  Musculoskeletal: She exhibits no edema.  Lymphadenopathy:  She has no cervical adenopathy.  Neurological: She is fairly alert.  Flat affect. Patient with right gaze preference. She follows simple commands. Names person, place and date of birth. 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 Left facial droop, mild left tongue deviation. Speech slurred. Appears fatigued Sensation present but diminished to PP  on the left. LUE 0/5. Trace to 0/5 LLE. Extensor tone noted. Toes up. No clonus. DTR's 3+.  Skin: Skin is warm and dry   Results for orders placed during the hospital encounter of 09/15/12 (from the past 48 hour(s))  CBC WITH DIFFERENTIAL     Status: Abnormal   Collection Time    09/20/12  5:40 AM      Result Value Range   WBC 4.2  4.0 - 10.5 K/uL   RBC 3.90  3.87 - 5.11 MIL/uL   Hemoglobin 13.0  12.0 - 15.0 g/dL   HCT 39.4  36.0 - 46.0 %   MCV 101.0 (*) 78.0 - 100.0 fL   MCH 33.3  26.0 - 34.0 pg   MCHC 33.0  30.0 - 36.0 g/dL   RDW 12.0  11.5 - 15.5 %   Platelets 220  150 - 400 K/uL   Neutrophils Relative 55  43 - 77 %   Neutro Abs 2.3  1.7 - 7.7 K/uL   Lymphocytes Relative 32  12 - 46 %   Lymphs Abs 1.4  0.7 - 4.0 K/uL   Monocytes Relative 10  3 - 12 %   Monocytes Absolute 0.4  0.1 - 1.0 K/uL   Eosinophils Relative 2  0 - 5 %   Eosinophils Absolute 0.1  0.0 - 0.7 K/uL   Basophils Relative 1  0 - 1 %   Basophils Absolute 0.0  0.0 - 0.1 K/uL  BASIC METABOLIC PANEL     Status: Abnormal   Collection Time    09/20/12  5:40 AM      Result Value Range   Sodium 142  135 - 145 mEq/L     Potassium 3.9  3.5 - 5.1 mEq/L   Chloride 107  96 - 112 mEq/L   CO2 25  19 - 32 mEq/L   Glucose, Bld 102 (*) 70 - 99 mg/dL   BUN 10  6 - 23 mg/dL   Creatinine, Ser 0.84  0.50 - 1.10 mg/dL   Calcium 9.0  8.4 - 10.5 mg/dL   GFR calc non Af Amer 75 (*) >90 mL/min   GFR calc Af Amer 86 (*) >90 mL/min   Comment:            The eGFR has been calculated     using the CKD EPI equation.     This calculation has not been     validated in all clinical     situations.     eGFR's persistently     <90 mL/min signify     possible Chronic Kidney Disease.   No results found.  Post Admission Physician Evaluation: 1. Functional deficits secondary  to hemorrhagic right MCA  infarct superimposed upon remote right brain infarct with left hemiparesis, and visual-spatial deficits.  2. Patient is admitted to receive collaborative, interdisciplinary care between the physiatrist, rehab nursing staff, and therapy team. 3. Patient's level of medical complexity and substantial therapy needs in context of that medical necessity cannot be provided at a lesser intensity of care such as a SNF. 4. Patient has experienced substantial functional loss from his/her baseline which was documented above under the "Functional History" and "Functional Status" headings.  Judging by the patient's diagnosis, physical exam, and functional history, the patient has potential for functional progress which will result in measurable gains while on inpatient rehab.  These gains will be of substantial and practical use upon discharge  in facilitating mobility and self-care at the household level. 5. Physiatrist will provide 24 hour management of medical needs as well as oversight of the therapy plan/treatment and provide guidance as appropriate regarding the interaction of the two. 6. 24 hour rehab nursing will assist with bladder management, bowel management, safety, skin/wound care, disease management, medication administration, pain management and patient education  and help integrate therapy concepts, techniques,education, etc. 7. PT will assess and treat for:  Lower extremity strength, range of motion, stamina, balance, functional mobility, safety, adaptive techniques and equipment, NMR, cognitive perceptual awareness, visual spatial awareness, tone mgt.  Goals are: supervision to min assist. 8. OT will assess and treat for: ADL's, functional mobility, safety, upper extremity strength, adaptive techniques and equipment, NMR, cognitive perceptual awareness, visual spatial awareness, tone mgt.   Goals are: min to mod assist. 9. SLP will assess and treat for: cognition, swallowing and communication.  Goals  are: supervision to perhaps  Min assist. 10. Case Management and Social Worker will assess and treat for psychological issues and discharge planning. 11. Team conference will be held weekly to assess progress toward goals and to determine barriers to discharge. 12. Patient will receive at least 3 hours of therapy per day at least 5 days per week. 13. ELOS: 3+ weeks      Prognosis:  good   Medical Problem List and Plan: 1. Partially hemorrhagic right MCA infarct superimposed upon remote right brain infarct with dense left hemiparesthesias and visual spatial deficits 2. DVT Prophylaxis/Anticoagulation: Subcutaneous Lovenox. Monitor platelet counts and any signs of bleeding 3. Pain Management: Ultram as needed. Monitor with increased mobility 4. Neuropsych: This patient is not capable of making decisions on his/her own behalf. 5. Seizure disorder. Keppra 500 mg every 12   hours. EEG negative 6. Hypertension. Avapro 150 mg daily. Monitor with increased activity  Zachary T. Swartz, MD, FAAPMR   09/21/2012 

## 2012-09-22 NOTE — Progress Notes (Signed)
Physical Therapy Treatment Patient Details Name: Stephanie Mason MRN: [id number] DOB: 1950/09/11 Today's Date: 09/22/2012 Time: 1191-4782 PT Time Calculation (min): 26 min  PT Assessment / Plan / Recommendation Comments on Treatment Session  Pt very pleasant & willing to participate.   Very limited use of Lt side.      Follow Up Recommendations  CIR     Does the patient have the potential to tolerate intense rehabilitation     Barriers to Discharge        Equipment Recommendations  None recommended by PT    Recommendations for Other Services Rehab consult  Frequency Min 4X/week   Plan Discharge plan remains appropriate;Frequency remains appropriate    Precautions / Restrictions Precautions Precautions: Fall Restrictions Weight Bearing Restrictions: No       Mobility  Bed Mobility Bed Mobility: Sitting - Scoot to Edge of Bed;Rolling Right;Right Sidelying to Sit Rolling Right: 2: Max assist;With rail Right Sidelying to Sit: HOB elevated;With rails;2: Max assist Sitting - Scoot to Edge of Bed: 2: Max assist Details for Bed Mobility Assistance: (A) to roll to Rt side, move LE's, & to lift shoulders/trunk to sitting upright, use of draw pad to bring hips closer to EOB.  Cues for sequencing & technique.   Transfers Transfers: Sit to Stand;Stand to Sit;Stand Pivot Transfers Sit to Stand: 1: +2 Total assist;From bed;From chair/3-in-1;With armrests Sit to Stand: Patient Percentage: 50% Stand to Sit: 1: +2 Total assist;With upper extremity assist;With armrests;To chair/3-in-1 Stand to Sit: Patient Percentage: 60% Stand Pivot Transfers: 1: +2 Total assist Stand Pivot Transfers: Patient Percentage: 50% Ambulation/Gait Ambulation/Gait Assistance: Not tested (comment)      PT Goals Acute Rehab PT Goals Time For Goal Achievement: 09/21/12 Potential to Achieve Goals: Good Pt will Roll Supine to Right Side: with supervision PT Goal: Rolling Supine to Right Side - Progress: Not  met Pt will Roll Supine to Left Side: with modified independence Pt will go Supine/Side to Sit: with min assist PT Goal: Supine/Side to Sit - Progress: Not met Pt will Sit at [facility] of Bed: with min assist;6-10 min;with unilateral upper extremity support PT Goal: Sit at Edge Of Bed - Progress: Progressing toward goal Pt will go Sit to Supine/Side: with min assist Pt will go Sit to Stand: with min assist PT Goal: Sit to Stand - Progress: Progressing toward goal Pt will go Stand to Sit: with min assist PT Goal: Stand to Sit - Progress: Progressing toward goal Pt will Transfer Bed to Chair/Chair to Bed: with mod assist Pt will Stand: 1 - 2 min;with mod assist PT Goal: Stand - Progress: Progressing toward goal Pt will Ambulate: 1 - 15 feet;with +2 total assist;with least restrictive assistive device  Visit Information  Last PT Received On: 09/22/12 Assistance Needed: +2    Subjective Data      Cognition  Cognition Overall Cognitive Status: Impaired Area of Impairment: Attention;Safety/judgement;Problem solving Arousal/Alertness: Awake/alert Orientation Level: Appears intact for tasks assessed Behavior During Session: [facility] for tasks performed Current Attention Level: Sustained Awareness of Errors: Assistance required to identify errors made;Assistance required to correct errors made Awareness of Errors - Other Comments: Pt without awareness to Lt side unless cued.      Balance  Static Sitting Balance Static Sitting - Balance Support: Right upper extremity supported;Feet supported Static Sitting - Level of Assistance: 4: Min assist;5: Stand by assistance Static Sitting - Comment/# of Minutes: Cues for upright posture & sitting midline.  Pt with mild LOB in all directions  but was able to self-correct with cues.  Facilitation for proper alignment of head/neck.    End of Session PT - End of Session Equipment Utilized During Treatment: Gait belt Activity Tolerance: Patient tolerated  treatment well Patient left: in chair;with call bell/phone within reach Nurse Communication: Mobility status     Verdell Face, [place] [phone] 09/22/2012

## 2012-09-22 NOTE — Progress Notes (Signed)
  Echocardiogram Echocardiogram Transesophageal has been performed.  Jorje Guild 09/22/2012, 11:49 AM

## 2012-09-22 NOTE — H&P (View-Only) (Signed)
Stroke Team Progress Note  HISTORY Stephanie Mason is an 62 y.o. female who was in her normal state of health this evening 09/14/2012. She had something to drink and went to bed. Shortly after going to bed complained that she was unable to move her left side. Her husband attempted to help her and noted that she was unable to use it as well. EMS was called at that time and patient was brought to [facility]. Seizure noted in ED and Ativan given. Patient was evaluated there (NIH stroke scale of 16 documented) and felt eligible for tPA. Patient was then transferred here for further management. Patient has a history of a stroke on the right side of the brain that occurred about 22 years ago. Patient had fully recovered from that event. Current NIH of 15. She was admitted to the neuro ICU for further evaluation and treatment.  SUBJECTIVE No complaints. Family member at bedside.  OBJECTIVE Most recent Vital Signs: Filed Vitals:   09/20/12 2200 09/21/12 0200 09/21/12 0600 09/21/12 0937  BP: 126/71 152/68 145/67 135/70  Pulse: 62 63 57 60  Temp: 97.7 F (36.5 C) 98.1 F (36.7 C) 97.9 F (36.6 C) 97.2 F (36.2 C)  TempSrc: Oral Oral Oral Oral  Resp: 18 18 18 20   Height:      Weight:      SpO2: 100% 98% 97% 98%   CBG (last 3)  No results found for this basename: GLUCAP,  in the last 72 hours  IV Fluid Intake:  . sodium chloride 75 mL/hr at 09/21/12 0018   MEDICATIONS  . aspirin EC  325 mg Oral Daily   Or  . aspirin  300 mg Rectal Daily  . enoxaparin (LOVENOX) injection  40 mg Subcutaneous Q24H  . irbesartan  150 mg Oral Daily  . levetiracetam  500 mg Intravenous Q12H  . pantoprazole  40 mg Oral Q1200   PRN:  acetaminophen, acetaminophen, bisacodyl, labetalol, ondansetron (ZOFRAN) IV, senna-docusate, sodium chloride, traMADol  Diet:  Dysphagia 3 thin liquids Activity:  As tolerated DVT Prophylaxis:  SCDs   CLINICALLY SIGNIFICANT STUDIES Basic Metabolic Panel:   Recent  Labs Lab 09/16/12 1024 09/20/12 0540  NA 140 142  K 4.4 3.9  CL 105 107  CO2 23 25  GLUCOSE 99 102*  BUN 10 10  CREATININE 0.76 0.84  CALCIUM 9.3 9.0   Liver Function Tests: No results found for this basename: AST, ALT, ALKPHOS, BILITOT, PROT, ALBUMIN,  in the last 168 hours CBC:   Recent Labs Lab 09/20/12 0540  WBC 4.2  NEUTROABS 2.3  HGB 13.0  HCT 39.4  MCV 101.0*  PLT 220   Coagulation: No results found for this basename: LABPROT, INR,  in the last 168 hours Cardiac Enzymes: No results found for this basename: CKTOTAL, CKMB, CKMBINDEX, TROPONINI,  in the last 168 hours Urinalysis: No results found for this basename: COLORURINE, APPERANCEUR, LABSPEC, PHURINE, GLUCOSEU, HGBUR, BILIRUBINUR, KETONESUR, PROTEINUR, UROBILINOGEN, NITRITE, LEUKOCYTESUR,  in the last 168 hours  Lipid Panel    Component Value Date/Time   CHOL 152 09/15/2012 0430   TRIG 63 09/15/2012 0430   HDL 52 09/15/2012 0430   CHOLHDL 2.9 09/15/2012 0430   VLDL 13 09/15/2012 0430   LDLCALC 87 09/15/2012 0430   HgbA1C  Lab Results  Component Value Date   HGBA1C 5.3 09/15/2012   Urine Drug Screen:   No results found for this basename: labopia,  cocainscrnur,  labbenz,  amphetmu,  thcu,  labbarb  Alcohol Level: No results found for this basename: ETH,  in the last 168 hours  CT of the brain  09/19/2012  1. Stable slightly decreased mass effect associated with the right  ACA  and MCA infarcts. Leftward midline shift now 4 mm.  2. Stable petechial hemorrhage.  3. No new intracranial abnormality.   09/17/2012 Large area progressing infarct in the right frontal and temporal lobes with a vague central hemorrhagic change better depicted on MRI. There is mass effect with increasing right to left midline shift now approximately 7 mm.  09/15/2012  Large right hemispheric acute/subacute infarct with mass effect and possible minimal petechial hemorrhage.  This finding appears to be superimposed upon a chronic right hemispheric  infarct as indicated by prominence of the right sylvian fissure.  Compression of the frontal horn of the right lateral ventricle and 1.9 mm of midline shift to the left.   MRI of the brain 09/16/2012  Large acute partially hemorrhagic right hemispheric infarct is superimposed upon remote infarct as detailed above.  This causes mass effect upon the right lateral ventricle with midline shift to the left by 7.4 mm.  Cerebellar atrophy.  Paranasal sinus mucosal thickening most notable maxillary sinuses and greater on the left     MRA of the brain 09/16/2012    Motion degraded examination limits evaluation as detailed above. What can be stated is that there is a decreased number of visualized right middle cerebral artery branch vessels consistent with the patient's acute and chronic infarct.   2D Echocardiogram  Normal LV function, no source of embolus.   Carotid Doppler  No evidence of hemodynamically significant internal carotid artery stenosis. Vertebral artery flow is antegrade.   TCD  This was a normal transcranial Doppler study, with normal flow direction and velocity of all identified vessels of the anterior and posterior circulations, with no evidence of stenosis, vasospasm or occlusion. There was no evidence of intracranial disease.    CXR  09/15/2012   1.  No active lung disease. 2.  Rounded opacity at the left lung base may represent focal eventration, but a mass cannot be excluded.   CT Chest 09/16/2012 1. A fat containing Bochdalek hernia accounts for the questioned abnormality at the base of the left hemithorax on recent chest radiograph. 2. No acute findings.  EEG this is a normal awake EEG   Therapy Recommendations CIR  Physical Exam   The patient is alert and cooperative at the time of the examination. Flat affect. Respiratory examination is clear. Cardiovascular examination reveals a regular rate and rhythm, no obvious murmurs or rubs are noted. Abdomen reveals positive bowel sounds, no  organomegaly or tenderness noted. Extremities are without significant edema. Neurologic examination reveals that the patient has a depression of the left nasolabial fold. The patient has full extraocular movements, and visual fields are full, with the exception that there appears to be a left inferior quadrantopia.  Speech is not aphasic, slightly dysarthric. The patient has significant weakness of the left arm and left leg, without voluntary motor movements. The patient has no neglect of the left side. The patient reports symmetric soft touch sensation on one side to the next. He can reflexes are relatively symmetric, slightly brisk in the legs, left Babinski is seen. The patient is able to perform finger-nose-finger and heel-to-shin on the right side, not on the left. The patient could not be ambulated.  .ASSESSMENT [name] is a 62 y.o. female presenting with left hemiparesis. Status post  IV t-PA 09/14/2012 at 2348 at [facility]. She also had a seizure in their ED. Imaging confirms a large partially hemorrhagic right MCA infarct superimposed upon remote right brain infarct with cytotoxic cerebral edema and 7.16mm of midline shift. Now beyond the window for neurologic worsening from cerebral edema. Infarct embolic secondary to unknown etiology. On aspirin 81 mg orally every day prior to admission. Now on aspirin 325 mg orally every day for secondary stroke prevention. Patient with resultant right gaze preference, left hemiparesis, hemisensory deficit. Work up underway. CIR recommended.  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   Continue Keppra TEE to look for embolic source. Arranged with [facility]  Cardiology for tomorrow.  If positive for PFO (patent foramen ovale), check bilateral lower extremity venous dopplers to rule out DVT as possible source of stroke.  Transfer to rehab post TEE if bed available Will schedule outpatient telemetry monitoring to assess patient for atrial fibrillation as source of stroke.   Annie Main, MSN, RN, ANVP-BC, ANP-BC, GNP-BC [facility] Pager: [phone] 09/21/2012 3:00 PM  I have personally obtained a history, examined the patient, evaluated imaging results, and formulated the assessment and plan of care. I agree with the above. Delia Heady, MD Medical Director [facility] Pager: [phone] 09/21/2012 7:35 PM

## 2012-09-23 ENCOUNTER — Inpatient Hospital Stay (HOSPITAL_COMMUNITY): Payer: BC Managed Care – PPO | Admitting: *Deleted

## 2012-09-23 ENCOUNTER — Inpatient Hospital Stay (HOSPITAL_COMMUNITY): Payer: BC Managed Care – PPO | Admitting: Occupational Therapy

## 2012-09-23 ENCOUNTER — Inpatient Hospital Stay (HOSPITAL_COMMUNITY): Payer: BC Managed Care – PPO | Admitting: Speech Pathology

## 2012-09-23 ENCOUNTER — Encounter (HOSPITAL_COMMUNITY): Payer: Self-pay | Admitting: Cardiovascular Disease

## 2012-09-23 DIAGNOSIS — I69998 Other sequelae following unspecified cerebrovascular disease: Secondary | ICD-10-CM

## 2012-09-23 DIAGNOSIS — I6330 Cerebral infarction due to thrombosis of unspecified cerebral artery: Secondary | ICD-10-CM

## 2012-09-23 DIAGNOSIS — G8110 Spastic hemiplegia affecting unspecified side: Secondary | ICD-10-CM

## 2012-09-23 DIAGNOSIS — R209 Unspecified disturbances of skin sensation: Secondary | ICD-10-CM

## 2012-09-23 LAB — CBC WITH DIFFERENTIAL/PLATELET
Basophils Relative: 1 % (ref 0–1)
Eosinophils Absolute: 0.1 10*3/uL (ref 0.0–0.7)
Eosinophils Relative: 2 % (ref 0–5)
Lymphs Abs: 1 10*3/uL (ref 0.7–4.0)
MCH: 33.8 pg (ref 26.0–34.0)
MCHC: 33.3 g/dL (ref 30.0–36.0)
MCV: 101.8 fL — ABNORMAL HIGH (ref 78.0–100.0)
Neutrophils Relative %: 64 % (ref 43–77)
Platelets: 243 10*3/uL (ref 150–400)
RBC: 3.96 MIL/uL (ref 3.87–5.11)

## 2012-09-23 LAB — COMPREHENSIVE METABOLIC PANEL
ALT: 86 U/L — ABNORMAL HIGH (ref 0–35)
Albumin: 3.1 g/dL — ABNORMAL LOW (ref 3.5–5.2)
BUN: 14 mg/dL (ref 6–23)
Calcium: 9.5 mg/dL (ref 8.4–10.5)
GFR calc Af Amer: 88 mL/min — ABNORMAL LOW (ref >90)
Glucose, Bld: 98 mg/dL (ref 70–99)
Sodium: 142 mEq/L (ref 135–145)
Total Protein: 6.6 g/dL (ref 6.0–8.3)

## 2012-09-23 NOTE — Care Management Note (Signed)
Inpatient Rehabilitation Center Individual Statement of Services  Patient Name:  Stephanie Mason  Date:  09/23/2012  Welcome to the Inpatient Rehabilitation Center.  Our goal is to provide you with an individualized program based on your diagnosis and situation, designed to meet your specific needs.  With this comprehensive rehabilitation program, you will be expected to participate in at least 3 hours of rehabilitation therapies Monday-Friday, with modified therapy programming on the weekends.  Your rehabilitation program will include the following services:  Physical Therapy (PT), Occupational Therapy (OT), Speech Therapy (ST), 24 hour per day rehabilitation nursing, Therapeutic Recreaction (TR), Neuropsychology, Case Management ( Social Worker), Rehabilitation Medicine, Nutrition Services and Pharmacy Services  Weekly team conferences will be held on Wednesday to discuss your progress.  Your  Social Worker will talk with you frequently to get your input and to update you on team discussions.  Team conferences with you and your family in attendance may also be held.  Expected length of stay: 3-4 weeks Overall anticipated outcome: min level  Depending on your progress and recovery, your program may change.  Your  Social Worker will coordinate services and will keep you informed of any changes.  Your  SW name and contact number are listed  below.  The following services may also be recommended but are not provided by the Inpatient Rehabilitation Center:   Driving Evaluations  Home Health Rehabiltiation Services  Outpatient Rehabilitatation [facility]  Vocational Rehabilitation   Arrangements will be made to provide these services after discharge if needed.  Arrangements include referral to agencies that provide these services.  Your insurance has been verified to be:  BCBS OUT OF STATE Your primary doctor is:  Hassan Rowan  Pertinent information will be shared with your doctor and your  insurance company.    Social Worker:  Dossie Der, [place] [phone]  Information discussed with and copy given to patient by: Lucy Chris, 09/23/2012, 8:55 AM

## 2012-09-23 NOTE — Progress Notes (Signed)
Patient information reviewed and entered into eRehab system by Ketzaly Cardella, RN, CRRN, PPS Coordinator.  Information including medical coding and functional independence measure will be reviewed and updated through discharge.    

## 2012-09-23 NOTE — Patient Care Conference (Signed)
Inpatient RehabilitationTeam Conference and Plan of Care Update Date: 09/23/2012   Time: 11:05 AM    Patient Name: Stephanie Mason      Medical Record Number: [id number]  Date of Birth: July 02, 1951 Sex: Female         Room/Bed: 4034/4034-01 Payor Info: Payor: BLUE CROSS BLUE SHIELD  Plan: BCBS PPO OUT OF STATE  Product Type: *No Product type*     Admitting Diagnosis: rt cva  Admit Date/Time:  09/22/2012  4:07 PM Admission Comments: No comment available   Primary Diagnosis:  CVA (cerebral infarction) Principal Problem: CVA (cerebral infarction)  Patient Active Problem List   Diagnosis Date Noted  . CVA (cerebral infarction) 09/22/2012  . 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 Hemiplegia, unspecified, affecting nondominant side 09/15/2012  . Cerebral embolism with cerebral infarction 09/15/2012  . Other convulsions 09/15/2012    Expected Discharge Date: Expected Discharge Date: 10/20/12  Team Members Present: Physician leading conference: Dr. Claudette Laws Social Worker Present: Dossie Der, LCSW Nurse Present: Rosalio Macadamia, RN PT Present: Edman Circle, PT;Caroline Adriana Simas, PT;Other (comment) Clarisse Gouge Ripa-PT) OT Present: Leonette Monarch, Felipa Eth, OT SLP Present: [name], SLP Other (Discipline and Name): Charolette Child Coordinator     Current Status/Progress Goal Weekly Team Focus  Medical   Right MCA infarct with left hemiplegia. Diabetes.  Maintain medical stability to insure full participation in rib dilatation program.  Medication adjustment.   Bowel/Bladder   Pt  has incontinent episodes  Pt will be continent with mod assist  Timed toileting   Swallow/Nutrition/ Hydration   Dys.3 textures and thin liquids with full supervision   least restrictive p.o. intake   increase carryover of safe swallow strategeis (small bites, clear pocketing)   ADL's     new eval        Mobility     new eval        Communication   min-mod assist  supervision  increase use of  compensatory strategies   Safety/Cognition/ Behavioral Observations  mod assist  min assist  increase awareness, self monitoring and correcting   Pain   n/a         Skin   Skin intatct  Skin will remain inatct with mod assist         *See Interdisciplinary Assessment and Plan and progress notes for long and short-term goals  Barriers to Discharge: Heavy physical assist level    Possible Resolutions to Barriers:  Continue rehabilitation program to upgrade functional status as well as identify caregivers and train appropriately    Discharge Planning/Teaching Needs:    Home with boyfriend and daughter to assist.  Aware will need 24 hr care     Team Discussion:  Currently max-total-l-inattention, little feeling of left side.  Timed toileting to work on continence.  Eval pending  Revisions to Treatment Plan:  New Eval   Continued Need for Acute Rehabilitation Level of Care: The patient requires daily medical management by a physician with specialized training in physical medicine and rehabilitation for the following conditions: Daily direction of a multidisciplinary physical rehabilitation program to ensure safe treatment while eliciting the highest outcome that is of practical value to the patient.: Yes Daily medical management of patient stability for increased activity during participation in an intensive rehabilitation regime.: Yes Daily analysis of laboratory values and/or radiology reports with any subsequent need for medication adjustment of medical intervention for : Neurological problems  Jaysin Gayler, Lemar Livings 09/25/2012, 9:24 AM

## 2012-09-23 NOTE — Progress Notes (Signed)
Social Work Assessment and Plan Social Work Assessment and Plan  Patient Details  Name: Stephanie Mason MRN: [id number] Date of Birth: [birth year]  Today's Date: 09/23/2012  Problem List:  Patient Active Problem List  Diagnosis  . Hemiplegia, unspecified, affecting nondominant side  . Cerebral embolism with cerebral infarction  . Other convulsions  . Cytotoxic cerebral edema  . CVA (cerebral infarction)   Past Medical History:  Past Medical History  Diagnosis Date  . Stroke   . Hypertension    Past Surgical History:  Past Surgical History  Procedure Laterality Date  . Tee without cardioversion N/A 09/22/2012    Procedure: TRANSESOPHAGEAL ECHOCARDIOGRAM (TEE);  Surgeon: [name], MD;  Location: [facility] ENDOSCOPY;  Service: Cardiovascular;  Laterality: N/A;   Social History:  reports that she has never smoked. She does not have any smokeless tobacco history on file. She reports that she does not drink alcohol or use illicit drugs.  Family / Support Systems Marital Status: Single Patient Roles: Partner;Parent;Other (Comment) (Employee) Spouse/Significant Other: Stephanie Mason  [phone]-cell Children: Stephanie Mason-daughter  [phone] Other Supports: Son local also Anticipated Caregiver: Stephanie Mason and Stephanie Mason Ability/Limitations of Caregiver: Stephanie Mason works days and daughter to be with while he works Medical laboratory scientific officer: 24/7 Family Dynamics: Close knit family who are supportive of one another and will be there   Social History Preferred language: English Religion:  Cultural Background: No issues Education: Producer, television/film/video- CNA Certification Read: Yes Write: Yes Employment Status: Employed Name of Employer: Designer, television/film set Return to Work Plans: 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  Will rely upon daughter and son since she is not legally married to  [place]   Abuse/Neglect Physical Abuse: Denies Verbal Abuse: Denies Sexual Abuse: Denies Exploitation of patient/patient's resources: Denies Self-Neglect: Denies  Emotional Status Pt's affect, behavior adn adjustment status: Pt is motivated and tired from her therapies today.  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  She is trying to adapt to this. Recent Psychosocial Issues: Other medical issues-had a CVA 22 years ago and recovered.  She is hoping this will be the same result Pyschiatric History: No history-depression screen score-2.  She feels she is just beginning to this program and is optimistic regarding her progress and feels she will do better than team anticipates.  This is only her first day here.  Patient / Family Perceptions, Expectations & Goals Pt/Family understanding of illness & functional limitations: Pt can explain she ahd a stroke and her arm and leg are affected, along with her talking.  Will talk with daughter or boyfriend about their understanding when here or on the phone.  Pt seemes to understand reaosn she is here and deficits. Premorbid pt/family roles/activities: Mother, Employee, Church member etc Anticipated changes in roles/activities/participation: resume Pt/family expectations/goals: Pt states: " I want to take care of myself, I am hopeful."  [employer]: None Premorbid Home Care/DME Agencies: None Transportation available at discharge: [employer] referrals recommended: Support group (specify) (CVA Support group)  Discharge Planning Living Arrangements: Spouse/significant other Support Systems: Spouse/significant other;Children;Friends/neighbors;Church/faith community Type of Residence: Private residence Insurance Resources: Media planner (specify) (BCBS Out of State) Financial Resources: Employment;Family Support Financial Screen  Referred: No Living Expenses: Lives with family Money Management: Patient;Significant Other Do you have any problems obtaining your medications?: No  Home Management: Self Patient/Family Preliminary Plans: Return home with boyfriend and daughter to asssit, aware pt will need 24 hr care at discharge from the hosptial.   Awaiting progress to fimr up the plans. Social Work Anticipated Follow Up Needs: HH/OP;Support Group DC Planning Additional Notes/Comments: Pt may need to apply for SSD if disability will be 12 months-ask MD  Clinical Impression Pleasant hard working female who is used to being the caregiver not the caregiveree.  She may need Neuro-psych eval and assistance with coping while here. Provide support and assist with developing a safe discharge plan.  Lucy Chris 09/23/2012, 3:21 PM

## 2012-09-23 NOTE — Evaluation (Signed)
Physical Therapy Assessment and Plan  Patient Details  Name: Stephanie Mason MRN: [id number] Date of Birth: Dec 26, 1950  PT Diagnosis: Abnormal posture, Abnormality of gait, Cognitive deficits, Coordination disorder, Hemiplegia non-dominant, Hypotonia, Impaired cognition, Impaired sensation and Muscle weakness Rehab Potential: Good ELOS: 3-4 weeks   Today's Date: 09/23/2012 Time: 1130-1201 and 1330-1430  Time Calculation (min): 31 min and 60 min  Problem List:  Patient Active Problem List  Diagnosis  . Hemiplegia, unspecified, affecting nondominant side  . Cerebral embolism with cerebral infarction  . Other convulsions  . Cytotoxic cerebral edema  . CVA (cerebral infarction)    Past Medical History:  Past Medical History  Diagnosis Date  . Stroke   . Hypertension    Past Surgical History:  Past Surgical History  Procedure Laterality Date  . Tee without cardioversion N/A 09/22/2012    Procedure: TRANSESOPHAGEAL ECHOCARDIOGRAM (TEE);  Surgeon: [name], MD;  Location: [facility] ENDOSCOPY;  Service: Cardiovascular;  Laterality: N/A;    Assessment & Plan Clinical Impression: Clyde Upshaw is a 62 y.o. 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 Admitted 09/15/2012 to [facility] with left-sided weakness. Patient with noted seizure in the ED and received Ativan as well as loaded with Keppra. MRI of the brain showed large acute partially hemorrhagic right hemispheric infarct superimposed upon remote infarct. Patient did receive TPA. Echocardiogram with normal left ventricular function no source of embolus. EEG showed no seizure activity. Carotid Dopplers with no ICA stenosis. TCD with right terminal ICA stenosis. Patient was transferred to [facility] for ongoing evaluation. Placed on aspirin therapy for stroke prophylaxis as well as subcutaneous Lovenox for DVT prophylaxis. TEE completed 09/22/2012 showing no PFO  or thrombus. Maintained on a dysphagia 3 thin liquid diet. Chest x-ray with questionable mass that was ruled out with CT of the chest. Physical and occupational therapy evaluations completed 09/17/2012 with recommendations of physical medicine rehabilitation consult to consider inpatient rehabilitation services. Patient transferred to CIR on 09/22/2012 .   Patient currently requires total with mobility secondary to muscle weakness, decreased cardiorespiratoy endurance, impaired timing and sequencing, abnormal tone, decreased coordination and decreased motor planning, decreased visual perceptual skills and field cut, decreased midline orientation, decreased attention to left and decreased motor planning, decreased attention, decreased awareness, decreased problem solving and decreased safety awareness and decreased sitting balance, decreased standing balance, decreased postural control, hemiplegia and decreased balance strategies.  Prior to hospitalization, patient was independent  with mobility and lived with Son;Other (Comment);Significant other (boyfriend doesn't live w/ pt, but she will stay w/ him a lot) in a House home.  Home access is threshold at her house, 4 STE at boyfriend's houseStairs to enter.  Patient will benefit from skilled PT intervention to maximize safe functional mobility, minimize fall risk and decrease caregiver burden for planned discharge home with 24 hour assist.  Anticipate patient will benefit from follow up [facility] at discharge.  PT - End of Session Activity Tolerance: Tolerates 30+ min activity with multiple rests Endurance Deficit: Yes Endurance Deficit Description: Pt fatigues quickly with standing tasks. PT Assessment Rehab Potential: Good Barriers to Discharge: Other (comment);Inaccessible home environment (If pt stays w/ boyfriend there are 4 STE) PT Plan PT Intensity: Minimum of 1-2 x/day ,45 to 90 minutes PT Frequency: 5 out of 7 days PT Duration Estimated Length of  Stay: 3-4 weeks PT Treatment/Interventions: Ambulation/gait training;Balance/vestibular training;Cognitive remediation/compensation;Community reintegration;Discharge planning;Neuromuscular re-education;Functional mobility training;Functional electrical stimulation;DME/adaptive equipment instruction;Pain management;Patient/family education;Psychosocial support;Splinting/orthotics;UE/LE  Coordination activities;UE/LE Strength taining/ROM;Therapeutic Exercise;Therapeutic Activities;Wheelchair propulsion/positioning;Visual/perceptual remediation/compensation;Stair training PT Recommendation Recommendations for Other Services: Speech consult;Neuropsych consult Follow Up Recommendations: Home health PT Patient destination: Home Equipment Recommended: Wheelchair (measurements);Wheelchair cushion (measurements);Other (comment) (DME assessment ongoing, continue to assess) Equipment Details: DME needs TBD closer to discharge  Skilled Therapeutic Intervention Treatment initiated with emphasis on static and dynamic sitting balance. Patient requires stand by assistance- min assist for static sitting with feet supported. If patient experiences LOB (every time was laterally to the L or posteriorly and to the L), patient requires max-total assist to return to upright posture and midline orientation. Patient performed dynamic balance activity with UE ring rainbow with emphasis on lateral weight shifting and tracking/attention to the L. Patient performed anterior weight shifts to push table away from her with R UE while PT assists to maintain position and weight bearing through L UE. Patient requires min assist for activity.  Patient returned to room and left seated in wheelchair with seatbelt donned and all needs within reach.   PT Evaluation Precautions/Restrictions Precautions Precautions: Fall Precaution Comments: dense L hemiplegia, incresed extensor tone LLE, left neglect Restrictions Weight Bearing  Restrictions: No General Chart Reviewed: Yes  Pain Pain Assessment Pain Assessment: No/denies pain Pain Score: 0-No pain Home Living/Prior Functioning Home Living Lives With: Son;Other (Comment);Significant other (boyfriend doesn't live w/ pt, but she will stay w/ him a lot) Available Help at Discharge: Family;Available 24 hours/day Type of Home: House Home Access: Stairs to enter [employer] of Steps: threshold at her house, 4 STE at boyfriend's house Entrance Stairs-Rails: Left (L handrail at boyfriend's house) Home Layout: One level (boyfriend also has one level home) Bathroom Shower/Tub: Curtain;Tub/shower unit Bathroom Accessibility: Yes How Accessible: Accessible via wheelchair;Accessible via walker Home Adaptive Equipment: None Prior Function Level of Independence: Independent with gait;Independent with transfers;Independent with homemaking with ambulation;Independent with basic ADLs Able to Take Stairs?: Yes Driving: Yes 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 Vision/Perception  Vision - History Baseline Vision: Wears glasses all the time Patient Visual Report: Other (comment) (L inattention) Vision - Assessment Alignment/Gaze Preference: Gaze right Perception Perception: Impaired Inattention/Neglect: Does not attend to left visual field;Does not attend to left side of body;Other (comment) (Turns head and scans to L with verbal cues)  Cognition Overall Cognitive Status: Impaired Arousal/Alertness: Awake/alert Orientation Level: Oriented X4 Awareness: Impaired Awareness Impairment: Emergent impairment Sensation Sensation Light Touch: Impaired by gross assessment (absent L LE) Hot/Cold: Not tested Proprioception: Impaired by gross assessment Additional Comments: Impaired/absent proprioception of L LE at great toe and ankle. Coordination Gross Motor Movements are Fluid and  Coordinated: No Fine Motor Movements are Fluid and Coordinated: No Coordination and Movement Description: No active movement in L UE and L LE Heel Shin Test: Intact R LE, unable L LE Motor  Motor Motor: Abnormal postural alignment and control;Hemiplegia Motor - Skilled Clinical Observations: Hypotonia L UE and L LE (trace to no active ROM); no clonus noted  Mobility Bed Mobility Bed Mobility: Supine to Sit;Sitting - Scoot to Edge of Bed Rolling Right: 2: Max assist Rolling Right Details: Verbal cues for safe use of DME/AE;Manual facilitation for weight bearing Supine to Sit: 2: Max assist;HOB elevated;With rails Supine to Sit Details: Tactile cues for initiation;Manual facilitation for placement;Manual facilitation for weight shifting;Verbal cues for precautions/safety;Verbal cues for technique;Verbal cues for sequencing Sitting - Scoot to Edge of Bed: 3: Mod assist Sitting - Scoot to Edge of Bed Details: Tactile cues  for initiation;Manual facilitation for placement;Manual facilitation for weight bearing;Verbal cues for precautions/safety;Verbal cues for technique;Verbal cues for sequencing Transfers Sit to Stand: From elevated surface;2: Max assist Sit to Stand Details: Manual facilitation for weight bearing;Manual facilitation for placement;Manual facilitation for weight shifting Stand to Sit: 2: Max assist Stand to Sit Details (indicate cue type and reason): Manual facilitation for weight bearing;Manual facilitation for placement Stand Pivot Transfers: 2: Max assist Stand Pivot Transfer Details: Manual facilitation for weight shifting;Manual facilitation for placement Squat Pivot Transfers: 2: Max assist;With armrests Squat Pivot Transfer Details: Tactile cues for initiation;Manual facilitation for placement;Manual facilitation for weight bearing;Verbal cues for sequencing;Verbal cues for technique;Verbal cues for precautions/safety Locomotion  Ambulation Ambulation/Gait Assistance:  Not tested (comment) Wheelchair Mobility Distance: 20  Trunk/Postural Assessment  Cervical Assessment Cervical Assessment: Within Functional Limits Thoracic Assessment Thoracic Assessment: Within Functional Limits Lumbar Assessment Lumbar Assessment: Within Functional Limits Postural Control Postural Control: Deficits on evaluation Postural Limitations: Patient sits edge of bed with kyphotic posture and left lateral trunk lean.  Balance Balance Balance Assessed: Yes Static Sitting Balance Static Sitting - Balance Support: Right upper extremity supported;Feet supported Static Sitting - Level of Assistance: 5: Stand by assistance;4: Min assist Static Sitting - Comment/# of Minutes: Patient requires verbal cues for upright posture and midline orientation for sitting edge of bed for 1 min. When patient demonstrates increasing L lateral lean, she is able to self-correct midline orientation with verbal cues. Dynamic Sitting Balance Dynamic Sitting - Balance Support: During functional activity;Feet supported Dynamic Sitting - Level of Assistance: 2: Max assist;5: Stand by assistance Dynamic Sitting Balance - Compensations: Patient able to perform lateral lean on R elbow and return to upright sitting with stand by assistance. Patient requires max assist with LOB laterally to the L. Extremity Assessment  RLE Assessment RLE Assessment: Within Functional Limits LLE Assessment LLE Assessment: Exceptions to [facility] LLE Strength LLE Overall Strength: Deficits LLE Overall Strength Comments: 0-1/5 strength overall L LE  FIM:  FIM - Bed/Chair Transfer Bed/Chair Transfer Assistive Devices: Bed rails;HOB elevated;Arm rests Bed/Chair Transfer: 2: Chair or W/C > Bed: Max A (lift and lower assist);2: Supine > Sit: Max A (lifting assist/Pt. 25-49%);2: Bed > Chair or W/C: Max A (lift and lower assist) FIM - Locomotion: Wheelchair Distance: 20 Locomotion: Wheelchair: 1: Travels less than 50 ft with maximal  assistance (Pt: 25 - 49%) FIM - Locomotion: Ambulation Ambulation/Gait Assistance: Not tested (comment) Locomotion: Ambulation: 0: Activity did not occur FIM - Locomotion: Stairs Locomotion: Stairs: 0: Activity did not occur   Refer to Care Plan for Long Term Goals  Recommendations for other services: Neuropsych  Discharge Criteria: Patient will be discharged from PT if patient refuses treatment 3 consecutive times without medical reason, if treatment goals not met, if there is a change in medical status, if patient makes no progress towards goals or if patient is discharged from hospital.  The above assessment, treatment plan, treatment alternatives and goals were discussed and mutually agreed upon: by patient  Chipper Herb. Ranell Finelli, PT, DPT  09/23/2012, 3:05 PM

## 2012-09-23 NOTE — Evaluation (Signed)
Occupational Therapy Assessment and Plan  Patient Details  Name: Stephanie Mason MRN: [id number] Date of Birth: 1951-04-01  OT Diagnosis: abnormal posture, disturbance of vision, hemiplegia affecting non-dominant side and muscular wasting and disuse atrophy Rehab Potential: Rehab Potential: Excellent ELOS: 3 weeks   Today's Date: 09/23/2012 Time: 0800-0859 Time Calculation (min): 59 min  Problem List:  Patient Active Problem List  Diagnosis  . Hemiplegia, unspecified, affecting nondominant side  . Cerebral embolism with cerebral infarction  . Other convulsions  . Cytotoxic cerebral edema  . CVA (cerebral infarction)    Past Medical History:  Past Medical History  Diagnosis Date  . Stroke   . Hypertension    Past Surgical History:  Past Surgical History  Procedure Laterality Date  . Tee without cardioversion N/A 09/22/2012    Procedure: TRANSESOPHAGEAL ECHOCARDIOGRAM (TEE);  Surgeon: [name], MD;  Location: [facility] ENDOSCOPY;  Service: Cardiovascular;  Laterality: N/A;    Assessment & Plan Clinical Impression: Patient is a 62 y.o. year old female with recent admission to the hospital on 09/15/2012 to [facility] with left-sided weakness. Patient with noted seizure in the ED and received Ativan as well as loaded with Keppra. MRI of the brain showed large acute partially hemorrhagic right hemispheric infarct superimposed upon remote infarct. Patient did receive TPA. Echocardiogram with normal left ventricular function no source of embolus. EEG showed no seizure activity. Carotid Dopplers with no ICA stenosis. TCD with right terminal ICA stenosis.  Patient transferred to CIR on 09/22/2012 .    Patient currently requires max with basic self-care skills secondary to muscle weakness, impaired timing and sequencing, abnormal tone, unbalanced muscle activation and decreased coordination, field cut and decreased attention to left and left side neglect.  Prior to  hospitalization, patient could complete ADLS and IADLs with independent .  Patient will benefit from skilled intervention to decrease level of assist with basic self-care skills and increase independence with basic self-care skills prior to discharge home with care partner.  Anticipate patient will require 24 hour supervision and minimal physical assistance and follow up home health.  OT - End of Session Activity Tolerance: Tolerates 30+ min activity with multiple rests Endurance Deficit: Yes Endurance Deficit Description: Pt fatigues quickly with standing tasks. OT Assessment Rehab Potential: Excellent Barriers to Discharge: None OT Plan OT Intensity: Minimum of 1-2 x/day, 45 to 90 minutes OT Frequency: 5 out of 7 days OT Duration/Estimated Length of Stay: 3 weeks OT Treatment/Interventions: Balance/vestibular training;Cognitive remediation/compensation;Community reintegration;Discharge planning;DME/adaptive equipment instruction;Psychosocial support;Patient/family education;Neuromuscular re-education;Functional electrical stimulation;Self Care/advanced ADL retraining;Therapeutic Activities;UE/LE Strength taining/ROM;UE/LE Coordination activities;Visual/perceptual remediation/compensation OT Recommendation Patient destination: Home Follow Up Recommendations: Home health OT Equipment Recommended: 3 in 1 bedside comode;Tub/shower bench  OT Evaluation Precautions/Restrictions  Precautions Precautions: Fall Precaution Comments: dense L hemiplegia, incresed extensor tone LLE, left neglect Restrictions Weight Bearing Restrictions: No  Pain Pain Assessment Pain Assessment: No/denies pain Pain Score: 0-No pain Home Living/Prior Functioning Home Living Lives With: Son;Other (Comment);Significant other (boyfriend doesn't live w/ pt, but she will stay w/ him a lot) Available Help at Discharge: Family;Available 24 hours/day Type of Home: House Home Access: Stairs to enter [employer] of Steps: threshold at her house, 4 STE at boyfriend's house Entrance Stairs-Rails: Left (L handrail at boyfriend's house) Home Layout: One level (boyfriend also has one level home) Bathroom Shower/Tub: Curtain;Tub/shower unit Bathroom Accessibility: Yes How Accessible: Accessible via wheelchair;Accessible via walker Home Adaptive Equipment: None Prior Function Level of Independence: Independent with gait;Independent with transfers;Independent with homemaking  with ambulation;Independent with basic ADLs Able to Take Stairs?: Yes Driving: Yes 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 ADL ADL Eating: Minimal assistance Where Assessed-Eating: Bed level Grooming: Moderate assistance Where Assessed-Grooming: Edge of bed Upper Body Bathing: Moderate assistance Where Assessed-Upper Body Bathing: Edge of bed Lower Body Bathing: Maximal assistance Where Assessed-Lower Body Bathing: Edge of bed Upper Body Dressing: Maximal assistance Where Assessed-Upper Body Dressing: Edge of bed Lower Body Dressing: Maximal assistance Where Assessed-Lower Body Dressing: Edge of bed Toileting: Dependent Where Assessed-Toileting: Bedside Commode Toilet Transfer: Maximal assistance Toilet Transfer Method: Stand pivot Acupuncturist: Gaffer: Not assessed Film/video editor: Not assessed ADL Comments: Pt overall needing max assist for dynamic trunk control sitting EOB.  No attempt during ADL to use the LUE functionally.  Keeps her head turned to the right side, but will turn it left to scan her environment with mod instructional cueing to locate items. Vision/Perception  Vision - History Baseline Vision: Wears glasses only for reading Patient Visual Report: No change from baseline Vision - Assessment Eye Alignment: Impaired (comment) (right eye sits supeirior compared to  the left eye) Ocular Range of Motion: Within Functional Limits Alignment/Gaze Preference: Gaze right Tracking/Visual Pursuits: Decreased smoothness of vertical tracking;Decreased smoothness of horizontal tracking Visual Fields: Left visual field deficit Perception Perception: Impaired Inattention/Neglect: Does not attend to left visual field Praxis Praxis: Intact  Cognition Overall Cognitive Status: Impaired Arousal/Alertness: Awake/alert Orientation Level: Oriented X4 Attention: Sustained Focused Attention: Appears intact Sustained Attention: Impaired Sustained Attention Impairment: Verbal complex Memory: Impaired Memory Impairment: Decreased recall of new information Awareness: Impaired Awareness Impairment: Intellectual impairment Problem Solving: Impaired Problem Solving Impairment: Verbal complex;Functional basic Executive Function: Initiating;Self Monitoring;Self Correcting Initiating: Impaired Initiating Impairment: Functional basic;Verbal basic Self Monitoring: Impaired Self Monitoring Impairment: Verbal complex;Functional basic Self Correcting: Impaired Self Correcting Impairment: Verbal complex;Functional basic Safety/Judgment: Impaired Comments: Pt with delays to fix her balance when sitting on the edge of the bed. Sensation Sensation Light Touch: Impaired Detail Light Touch Impaired Details: Absent LUE Stereognosis: Impaired Detail Stereognosis Impaired Details: Absent LUE Hot/Cold: Impaired Detail Hot/Cold Impaired Details: Absent LUE Proprioception: Impaired Detail Proprioception Impaired Details: Absent LUE Additional Comments: Impaired/absent proprioception of L LE at great toe and ankle. Coordination Gross Motor Movements are Fluid and Coordinated: No Fine Motor Movements are Fluid and Coordinated: No Coordination and Movement Description: Pt currently Brunnstrum stage I movement in the LUE during ADL. Heel Shin Test: Intact R LE, unable L LE Motor   Motor Motor: Abnormal postural alignment and control;Hemiplegia Motor - Skilled Clinical Observations: Hypotonia L UE and L LE (trace to no active ROM); no clonus noted Mobility  Bed Mobility Bed Mobility: Supine to Sit Rolling Right: 2: Max assist Rolling Right Details: Verbal cues for safe use of DME/AE;Manual facilitation for weight bearing Transfers Sit to Stand: From elevated surface;2: Max assist Sit to Stand Details: Manual facilitation for weight bearing;Manual facilitation for placement;Manual facilitation for weight shifting Stand to Sit: 2: Max assist Stand to Sit Details (indicate cue type and reason): Manual facilitation for weight bearing;Manual facilitation for placement  Trunk/Postural Assessment  Cervical Assessment Cervical Assessment: Within Functional Limits Thoracic Assessment Thoracic Assessment: Within Functional Limits Lumbar Assessment Lumbar Assessment: Within Functional Limits Postural Control Postural Control: Deficits on evaluation Postural Limitations: Pt sits in a posterior pelvic tilt with increased left trun elongation and right trunk shorteneing.  Balance Balance Balance Assessed: Yes Static Sitting Balance Static  Sitting - Balance Support: Right upper extremity supported;Feet supported Static Sitting - Level of Assistance: 5: Stand by assistance;4: Min assist Static Sitting - Comment/# of Minutes: Patient requires verbal cues for upright posture and midline orientation for sitting edge of bed for 1 min. When patient demonstrates increasing L lateral lean, she is able to self-correct midline orientation with verbal cues. Extremity/Trunk Assessment RUE Assessment RUE Assessment: Within Functional Limits LUE Assessment LUE Assessment: Exceptions to WFL LUE AROM (degrees) LUE Overall AROM Comments: Pt overall Brunnstrum stage I movment in the arm and hand.  PROM WFLs for all joints and no evidence of tone seen.  FIM:  FIM - Eating Eating  Activity: 4: Helper checks for pocketed food FIM - Grooming Grooming Steps: Wash, rinse, dry face Grooming: 3: Patient completes 2 of 4 or 3 of 5 steps FIM - Bathing Bathing Steps Patient Completed: Chest;Left Arm;Abdomen;Right upper leg Bathing: 2: Max-Patient completes 3-4 63f 10 parts or 25-49% FIM - Upper Body Dressing/Undressing Upper body dressing/undressing steps patient completed: Pull shirt over trunk Upper body dressing/undressing: 2: Max-Patient completed 25-49% of tasks FIM - Lower Body Dressing/Undressing Lower body dressing/undressing: 2: Max-Patient completed 25-49% of tasks FIM - Bed/Chair Transfer Bed/Chair Transfer: 2: Supine > Sit: Max A (lifting assist/Pt. 25-49%) FIM - Tub/Shower Transfers Tub/shower Transfers: 0-Activity did not occur or was simulated   Refer to Care Plan for Long Term Goals  Recommendations for other services: None  Discharge Criteria: Patient will be discharged from OT if patient refuses treatment 3 consecutive times without medical reason, if treatment goals not met, if there is a change in medical status, if patient makes no progress towards goals or if patient is discharged from hospital.  The above assessment, treatment plan, treatment alternatives and goals were discussed and mutually agreed upon: by patient  Began education on selfcare re-training, balance, LUE positioning during session as well.  Stephanie Mason 09/23/2012, 12:59 PM

## 2012-09-23 NOTE — Evaluation (Signed)
Speech Language Pathology Assessment and Plan  Patient Details  Name: Stephanie Mason MRN: [id number] Date of Birth: 1950-12-05  SLP Diagnosis: Dysarthria;Apraxia;Cognitive Impairments;Dysphagia (unilateral upper motor neuron)  Rehab Potential: Good ELOS: 3-4 weeks   Today's Date: 09/23/2012 Time: 4098-1191 Time Calculation (min): 60 min  Problem List:  Patient Active Problem List  Diagnosis  . Hemiplegia, unspecified, affecting nondominant side  . Cerebral embolism with cerebral infarction  . Other convulsions  . Cytotoxic cerebral edema  . CVA (cerebral infarction)   Past Medical History:  Past Medical History  Diagnosis Date  . Stroke   . Hypertension    Past Surgical History:  Past Surgical History  Procedure Laterality Date  . Tee without cardioversion N/A 09/22/2012    Procedure: TRANSESOPHAGEAL ECHOCARDIOGRAM (TEE);  Surgeon: [name], MD;  Location: [facility] ENDOSCOPY;  Service: Cardiovascular;  Laterality: N/A;    Assessment / Plan / Recommendation Clinical Impression  Stephanie Mason is a 62 y.o. 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 Admitted 09/15/2012 to [facility] with left-sided weakness. Patient with noted seizure in the ED and received Ativan as well as loaded with Keppra. MRI of the brain showed large acute partially hemorrhagic right hemispheric infarct superimposed upon remote infarct. Patient did receive TPA. Echocardiogram with normal left ventricular function no source of embolus. EEG showed no seizure activity. Carotid Dopplers with no ICA stenosis. TCD with right terminal ICA stenosis. Patient was transferred to [facility] for ongoing evaluation. Placed on aspirin therapy for stroke prophylaxis as well as subcutaneous Lovenox for DVT prophylaxis. TEE completed 09/22/2012 showing no PFO or thrombus. Maintained on a dysphagia 3 thin liquid diet. Chest x-ray with questionable mass  that was ruled out with CT of the chest. Physical and occupational therapy evaluations completed 09/17/2012 with recommendations of physical medicine rehabilitation consult to consider inpatient rehabilitation services. Patient admitted to [facility] 09/22/12. Bedside Swallow Evaluation revealed moderate sensory-motor predominantly oral dysphagia. Patient tolerating Dys.3 textures an thin liquids with moderate cuing from staff for pacing, portion control and use of safe swallow compensatory strategies. Cognitive-linguistic evaluation revealed moderate deficits characterized by dysarthria, decreased sustained attention, initiation, emergent awareness, problem solving and decreased safety awareness.  As a result, it is recommended that this patient receive skilled SLP service during CIR stay to maximize safe, functional abilities and reduce burden if care upon discharge. Treatment initiated during session and focused on cues to safely complete basic tasks such as self-feeding, ordering lunch and utilizing call bell with overall mod assist verbal cues.     SLP Assessment  Patient will need skilled Speech Lanaguage Pathology Services during CIR admission    Recommendations  Diet Recommendations: Dysphagia 3 (Mechanical Soft);Thin liquid Liquid Administration via: Cup;No straw Medication Administration: Whole meds with puree Supervision: Patient able to self feed;Full supervision/cueing for compensatory strategies Compensations: Slow rate;Check for pocketing;Small sips/bites;Check for anterior loss Postural Changes and/or Swallow Maneuvers: Seated upright 90 degrees Oral Care Recommendations: Oral care BID Patient destination: Home Follow up Recommendations: Home Health SLP;24 hour supervision/assistance Equipment Recommended: None recommended by SLP    SLP Frequency 5 out of 7 days   SLP Treatment/Interventions Cognitive remediation/compensation;Cueing hierarchy;Dysphagia/aspiration  precaution training;Environmental controls;Functional tasks;Internal/external aids;Oral motor exercises;Patient/family education;Speech/Language facilitation;Therapeutic Activities;Therapeutic Exercise    Pain Pain Assessment Pain Assessment: No/denies pain Pain Score: 0-No pain Prior Functioning Cognitive/Linguistic Baseline: Within functional limits Type of Home: House Lives With: Son;Other (Comment);Significant other (boyfriend doesn't live w/ pt, but she  will stay w/ him a lot) Available Help at Discharge: Family;Available 24 hours/day 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 SLP Short Term Goal 2 (Week 1): Patient will attend to basic task for 2-3 minutes with mod assit verbal cues for re-direction. SLP Short Term Goal 3 (Week 1): Patient will request help as needed with various tasks with mod assist question cues. SLP Short Term Goal 4 (Week 1): Patient will demonstrate basic problem solving during functional tasks with mod assist verbal, visual and tactile cues. SLP Short Term Goal 5 (Week 1): Patient will utilize speech intelligiblty strategies duirng verbal epression with min assist verbal and non-verbal cues.  See FIM for current functional status Refer to Care Plan for Long Term Goals  Recommendations for other services: None  Discharge Criteria: Patient will be discharged from SLP if patient refuses treatment 3 consecutive times without medical reason, if treatment goals not met, if there is a change in medical status, if patient makes no progress towards goals or if patient is discharged from hospital.  The above assessment, treatment plan, treatment alternatives and goals were discussed and mutually agreed upon: by patient  [name], M.A.,  CCC-SLP [phone]  [name] 09/23/2012, 12:30 PM

## 2012-09-23 NOTE — Progress Notes (Signed)
Patient ID: Stephanie Mason, female   DOB: August 19, 1950, 62 y.o.   MRN: [id number] Stephanie Mason is a 62 y.o. 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 Admitted 09/15/2012 to [facility] with left-sided weakness. Patient with noted seizure in the ED and received Ativan as well as loaded with Keppra. MRI of the brain showed large acute partially hemorrhagic right hemispheric infarct superimposed upon remote infarct. Patient did receive TPA. Echocardiogram with normal left ventricular function no source of embolus. EEG showed no seizure activity. Carotid Dopplers with no ICA stenosis. TCD with right terminal ICA stenosis. Patient was transferred to [facility] for ongoing evaluation. Placed on aspirin therapy for stroke prophylaxis as well as subcutaneous Lovenox for DVT prophylaxis. TEE completed 09/22/2012 showing no PFO or thrombus. Maintained on a dysphagia 3 thin liquid diet. Chest x-ray with questionable mass that was ruled out with CT of the chest  Subjective/Complaints:   Objective: Vital Signs: Blood pressure 157/87, pulse 62, temperature 98.3 F (36.8 C), temperature source Oral, resp. rate 20, height 5\' 7"  (1.702 m), weight 71.6 kg (157 lb 13.6 oz), SpO2 95.00%. No results found. Results for orders placed during the hospital encounter of 09/22/12 (from the past 72 hour(s))  CBC     Status: Abnormal   Collection Time    09/22/12  5:38 PM      Result Value Range   WBC 5.1  4.0 - 10.5 K/uL   RBC 3.83 (*) 3.87 - 5.11 MIL/uL   Hemoglobin 12.9  12.0 - 15.0 g/dL   HCT 57.8  46.9 - 62.9 %   MCV 101.8 (*) 78.0 - 100.0 fL   MCH 33.7  26.0 - 34.0 pg   MCHC 33.1  30.0 - 36.0 g/dL   RDW 52.8  41.3 - 24.4 %   Platelets 250  150 - 400 K/uL  CREATININE, SERUM     Status: None   Collection Time    09/22/12  5:38 PM      Result Value Range   Creatinine, Ser 0.73  0.50 - 1.10 mg/dL   GFR calc non Af Amer >90  >90 mL/min   GFR calc Af Amer >90  >90 mL/min   Comment:            The eGFR has been calculated     using the CKD EPI equation.     This calculation has not been     validated in all clinical     situations.     eGFR's persistently     <90 mL/min signify     possible Chronic Kidney Disease.  CBC WITH DIFFERENTIAL     Status: Abnormal   Collection Time    09/23/12  6:10 AM      Result Value Range   WBC 4.4  4.0 - 10.5 K/uL   RBC 3.96  3.87 - 5.11 MIL/uL   Hemoglobin 13.4  12.0 - 15.0 g/dL   HCT 01.0  27.2 - 53.6 %   MCV 101.8 (*) 78.0 - 100.0 fL   MCH 33.8  26.0 - 34.0 pg   MCHC 33.3  30.0 - 36.0 g/dL   RDW 64.4  03.4 - 74.2 %   Platelets 243  150 - 400 K/uL   Neutrophils Relative 64  43 - 77 %   Neutro Abs 2.8  1.7 - 7.7 K/uL   Lymphocytes Relative 23  12 - 46 %   Lymphs Abs 1.0  0.7 - 4.0 K/uL   Monocytes Relative 10  3 - 12 %   Monocytes Absolute 0.5  0.1 - 1.0 K/uL   Eosinophils Relative 2  0 - 5 %   Eosinophils Absolute 0.1  0.0 - 0.7 K/uL   Basophils Relative 1  0 - 1 %   Basophils Absolute 0.0  0.0 - 0.1 K/uL  COMPREHENSIVE METABOLIC PANEL     Status: Abnormal   Collection Time    09/23/12  6:10 AM      Result Value Range   Sodium 142  135 - 145 mEq/L   Potassium 4.4  3.5 - 5.1 mEq/L   Chloride 106  96 - 112 mEq/L   CO2 27  19 - 32 mEq/L   Glucose, Bld 98  70 - 99 mg/dL   BUN 14  6 - 23 mg/dL   Creatinine, Ser 1.61  0.50 - 1.10 mg/dL   Calcium 9.5  8.4 - 09.6 mg/dL   Total Protein 6.6  6.0 - 8.3 g/dL   Albumin 3.1 (*) 3.5 - 5.2 g/dL   AST 60 (*) 0 - 37 U/L   ALT 86 (*) 0 - 35 U/L   Alkaline Phosphatase 101  39 - 117 U/L   Total Bilirubin 0.4  0.3 - 1.2 mg/dL   GFR calc non Af Amer 76 (*) >90 mL/min   GFR calc Af Amer 88 (*) >90 mL/min   Comment:            The eGFR has been calculated     using the CKD EPI equation.     This calculation has not been     validated in all clinical     situations.     eGFR's persistently     <90 mL/min signify     possible Chronic  Kidney Disease.     HEENT: Left lower facial droop Cardio: RRR and no extra sounds Resp: CTA B/L and unlabored GI: BS positive and non tender Extremity:  Pulses positive and No Edema Skin:   Intact and Other no pressure areas Neuro: Lethargic, Flat, Cranial Nerve Abnormalities L central VII, Abnormal Sensory Absent to LT, Proprio, Abnormal Motor 0/5 in LUE and LLE, Abnormal FMC Ataxic/ dec FMC, Reflexes: 0, Tone:  Hypotonia, Dysarthric and Inattention Musc/Skel:  Other no pain with Left shoulder ROM Gen NAD   Assessment/Plan: 1. Functional deficits secondary to Right MCA infarct with hemorrhage which require 3+ hours per day of interdisciplinary therapy in a comprehensive inpatient rehab setting. Physiatrist is providing close team supervision and 24 hour management of active medical problems listed below. Physiatrist and rehab team continue to assess barriers to discharge/monitor patient progress toward functional and medical goals. FIM:                   Comprehension Comprehension Mode: Auditory Comprehension: 6-Follows complex conversation/direction: With extra time/assistive device  Expression Expression Mode: Verbal Expression: 5-Expresses basic needs/ideas: With no assist  Social Interaction Social Interaction: 5-Interacts appropriately 90% of the time - Needs monitoring or encouragement for participation or interaction.  Problem Solving Problem Solving: 4-Solves basic 75 - 89% of the time/requires cueing 10 - 24% of the time  Memory Memory: 6-More than reasonable amt of time  Medical Problem List and Plan:  1. Partially hemorrhagic right MCA infarct superimposed upon remote right brain infarct with dense left hemiparesthesias and visual spatial deficits  2. DVT Prophylaxis/Anticoagulation: Subcutaneous Lovenox. Monitor platelet counts and any signs of bleeding  3. Pain Management: Ultram as needed. Monitor with increased mobility  4. Neuropsych: This  patient is not capable of making decisions on his/her own behalf.  5. Seizure disorder. Keppra 500 mg every 12 hours. EEG negative  6. Hypertension. Avapro 150 mg daily. Monitor with increased activity 7.  Dysphagia, pocketing soft textures and staff supervision  LOS (Days) 1 A FACE TO FACE EVALUATION WAS PERFORMED  Stephanie Mason E 09/23/2012, 8:31 AM

## 2012-09-24 ENCOUNTER — Inpatient Hospital Stay (HOSPITAL_COMMUNITY): Payer: BC Managed Care – PPO | Admitting: Speech Pathology

## 2012-09-24 ENCOUNTER — Inpatient Hospital Stay (HOSPITAL_COMMUNITY): Payer: BC Managed Care – PPO | Admitting: *Deleted

## 2012-09-24 ENCOUNTER — Inpatient Hospital Stay (HOSPITAL_COMMUNITY): Payer: BC Managed Care – PPO | Admitting: Occupational Therapy

## 2012-09-24 MED ORDER — TROLAMINE SALICYLATE 10 % EX CREA: TOPICAL_CREAM | Freq: Three times a day (TID) | CUTANEOUS | Status: DC

## 2012-09-24 MED ORDER — MUSCLE RUB 10-15 % EX CREA
TOPICAL_CREAM | Freq: Three times a day (TID) | CUTANEOUS | Status: DC
  Administered 2012-09-24 – 2012-10-19 (×91): via TOPICAL
  Filled 2012-09-24 (×3): qty 85

## 2012-09-24 NOTE — Progress Notes (Signed)
Occupational Therapy Note  Patient Details  Name: Shereen Marton MRN: [id number] Date of Birth: [birth year] Today's Date: 09/24/2012  Time: 1130-1145(cotx with Speech therapy-total time 1130-1210) Pt denies pain Group Therapy Pt participated in self feeding group with focus on attention to left and adhering to swallowing strategies.  Pt required verbal cues to correct sitting posture when leaning to right.  Pt required min verbal cues to direct attention to left side of plate and turn head to left to interact with person sitting on left.  Pt required verbal cues for swallowing strategies.   Lavone Neri [facility] 09/24/2012, 3:33 PM

## 2012-09-24 NOTE — Progress Notes (Signed)
Speech Language Pathology Daily Session Note  Patient Details  Name: Stephanie Mason MRN: [id number] Date of Birth: 08-02-1951  Today's Date: 09/24/2012 Time: 3086-5784 Time Calculation (min): 25 min  Short Term Goals: Week 1: SLP Short Term Goal 1 (Week 1): Patient will consume Dys.3 textures and thin liquids with supervision vebral cues to utilize safe swallow compensatory strategies. SLP Short Term Goal 2 (Week 1): Patient will attend to basic task for 2-3 minutes with mod assit verbal cues for re-direction. SLP Short Term Goal 3 (Week 1): Patient will request help as needed with various tasks with mod assist question cues. SLP Short Term Goal 4 (Week 1): Patient will demonstrate basic problem solving during functional tasks with mod assist verbal, visual and tactile cues. SLP Short Term Goal 5 (Week 1): Patient will utilize speech intelligiblty strategies duirng verbal epression with min assist verbal and non-verbal cues.  Skilled Therapeutic Interventions: Group, co-treatment session with OT focused on addressing safely self-feeding lunch. Patient consumed Dys.3 textures and thin liquids with set up assist, min assist verbal cues to utilize safe swallow compensatory strategies and mod assist level verbal cues to problem solve by scanning to the left while self-feeding.  Patient exhibited no overt s/s of aspiration.  SLP recommends continuation with current plan of care.    FIM:  Comprehension Comprehension Mode: Auditory Comprehension: 4-Understands basic 75 - 89% of the time/requires cueing 10 - 24% of the time Expression Expression Mode: Verbal Expression: 4-Expresses basic 75 - 89% of the time/requires cueing 10 - 24% of the time. Needs helper to occlude trach/needs to repeat words. Social Interaction Social Interaction: 4-Interacts appropriately 75 - 89% of the time - Needs redirection for appropriate language or to initiate interaction. Problem Solving Problem Solving: 3-Solves  basic 50 - 74% of the time/requires cueing 25 - 49% of the time Memory Memory: 4-Recognizes or recalls 75 - 89% of the time/requires cueing 10 - 24% of the time FIM - Eating Eating Activity: 5: Set-up assist for open containers;5: Supervision/cues;5: Set-up assist for cut food  Pain Pain Assessment Pain Assessment: No/denies pain  Therapy/Group: Group Therapy  Charlane Ferretti., CCC-SLP [phone]  Cantrell Larouche 09/24/2012, 1:15 PM

## 2012-09-24 NOTE — Progress Notes (Signed)
Physical Therapy Session Note  Patient Details  Name: Stephanie Mason MRN: [id number] Date of Birth: August 03, 1951  Today's Date: 09/24/2012 Time: 1100-1130 and 1445-1530 Time Calculation (min): 30 min and 45 min  Short Term Goals: Week 1:  PT Short Term Goal 1 (Week 1): Patient will perform squat pivot transfers with mod assist. PT Short Term Goal 2 (Week 1): Patient will perform bed mobility with mod assist. PT Short Term Goal 3 (Week 1): Patient will propel 46' in wheelchair using hemi technique with mod assist. PT Short Term Goal 4 (Week 1): Patient will ambulate 84' with LRAD and max assist.  Skilled Therapeutic Interventions/Progress Updates:    AM Session: Patient received sitting in wheelchair. Today's session focused on wheelchair mobility, transfers, and static standing. Patient instructed in wheelchair mobility x25' utilizing hemi technique. Patient performs 4 static stands with R UE support and max assist required for upright posture, midline orientation, stabilization of L knee, weight bearing through L LE. Patient given verbal cues for scanning/attending to the L. Patient handed off to [employer] with seatbelt donned.  PM Session: Patient received sitting in wheelchair. Today's session focused on pre-gait activities and gait training. L DF ace wrap donned for entire session. Patient performed pre-gait activities with R handrail and max assist: lateral weight shifts and forward and retro stepping with R LE to increase weight bearing through L LE. Patient requires assist and verbal cues for upright posture, midline orientation, stabilization and weight bearing of L LE.  Patient instructed in gait training x25' with R handrail and max assist required for upright posture, midline orientation, advancement and placement of L LE, and stabilization of L knee during stance phase.  Patient returned to room and left seated in wheelchair with seatbelt donned and all needs within  reach.  Therapy Documentation Precautions:  Precautions Precautions: Fall Precaution Comments: dense L hemiplegia, incresed extensor tone LLE, left neglect Restrictions Weight Bearing Restrictions: No Pain: Pain Assessment Pain Assessment: No/denies pain Pain Score: 0-No pain Locomotion : Ambulation Ambulation/Gait Assistance: Not tested (comment) Wheelchair Mobility Wheelchair Mobility: Yes Wheelchair Assistance: 2: Max Chiropodist Details: Tactile cues for initiation;Manual facilitation for placement;Verbal cues for sequencing;Verbal cues for technique;Verbal cues for precautions/safety;Verbal cues for safe use of DME/AE Wheelchair Propulsion: Right upper extremity;Right lower extremity Wheelchair Parts Management: Needs assistance Distance: 25  Balance: Balance Balance Assessed: Yes Static Sitting Balance Static Sitting - Balance Support: Right upper extremity supported;Feet supported Static Sitting - Level of Assistance: 5: Stand by assistance Static Sitting - Comment/# of Minutes: 5-7 minutes seated edge of mat, patient exhibited improved midline orientation and upright posture. Static Standing Balance Static Standing - Balance Support: Right upper extremity supported;During functional activity Static Standing - Level of Assistance: 2: Max assist  See FIM for current functional status  Therapy/Group: Individual Therapy  Chipper Herb. [name], PT, DPT  09/24/2012, 1:15 PM

## 2012-09-24 NOTE — Progress Notes (Signed)
Patient ID: Stephanie Mason, female   DOB: 06/07/1951, 62 y.o.   MRN: [id number] Stephanie Mason is a 62 y.o. 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 Admitted 09/15/2012 to [facility] with left-sided weakness. Patient with noted seizure in the ED and received Ativan as well as loaded with Keppra. MRI of the brain showed large acute partially hemorrhagic right hemispheric infarct superimposed upon remote infarct. Patient did receive TPA. Echocardiogram with normal left ventricular function no source of embolus. EEG showed no seizure activity. Carotid Dopplers with no ICA stenosis. TCD with right terminal ICA stenosis. Patient was transferred to [facility] for ongoing evaluation. Placed on aspirin therapy for stroke prophylaxis as well as subcutaneous Lovenox for DVT prophylaxis. TEE completed 09/22/2012 showing no PFO or thrombus. Maintained on a dysphagia 3 thin liquid diet. Chest x-ray with questionable mass that was ruled out with CT of the chest  Subjective/Complaints: No complaints.  Sitting up with less leaning  Objective: Vital Signs: Blood pressure 117/76, pulse 60, temperature 98.1 F (36.7 C), temperature source Oral, resp. rate 18, height 5\' 7"  (1.702 m), weight 71.6 kg (157 lb 13.6 oz), SpO2 96.00%. No results found. Results for orders placed during the hospital encounter of 09/22/12 (from the past 72 hour(s))  CBC     Status: Abnormal   Collection Time    09/22/12  5:38 PM      Result Value Range   WBC 5.1  4.0 - 10.5 K/uL   RBC 3.83 (*) 3.87 - 5.11 MIL/uL   Hemoglobin 12.9  12.0 - 15.0 g/dL   HCT 78.2  95.6 - 21.3 %   MCV 101.8 (*) 78.0 - 100.0 fL   MCH 33.7  26.0 - 34.0 pg   MCHC 33.1  30.0 - 36.0 g/dL   RDW 08.6  57.8 - 46.9 %   Platelets 250  150 - 400 K/uL  CREATININE, SERUM     Status: None   Collection Time    09/22/12  5:38 PM      Result Value Range   Creatinine, Ser 0.73  0.50 - 1.10 mg/dL    GFR calc non Af Amer >90  >90 mL/min   GFR calc Af Amer >90  >90 mL/min   Comment:            The eGFR has been calculated     using the CKD EPI equation.     This calculation has not been     validated in all clinical     situations.     eGFR's persistently     <90 mL/min signify     possible Chronic Kidney Disease.  CBC WITH DIFFERENTIAL     Status: Abnormal   Collection Time    09/23/12  6:10 AM      Result Value Range   WBC 4.4  4.0 - 10.5 K/uL   RBC 3.96  3.87 - 5.11 MIL/uL   Hemoglobin 13.4  12.0 - 15.0 g/dL   HCT 62.9  52.8 - 41.3 %   MCV 101.8 (*) 78.0 - 100.0 fL   MCH 33.8  26.0 - 34.0 pg   MCHC 33.3  30.0 - 36.0 g/dL   RDW 24.4  01.0 - 27.2 %   Platelets 243  150 - 400 K/uL   Neutrophils Relative 64  43 - 77 %   Neutro Abs 2.8  1.7 - 7.7 K/uL   Lymphocytes Relative 23  12 -  46 %   Lymphs Abs 1.0  0.7 - 4.0 K/uL   Monocytes Relative 10  3 - 12 %   Monocytes Absolute 0.5  0.1 - 1.0 K/uL   Eosinophils Relative 2  0 - 5 %   Eosinophils Absolute 0.1  0.0 - 0.7 K/uL   Basophils Relative 1  0 - 1 %   Basophils Absolute 0.0  0.0 - 0.1 K/uL  COMPREHENSIVE METABOLIC PANEL     Status: Abnormal   Collection Time    09/23/12  6:10 AM      Result Value Range   Sodium 142  135 - 145 mEq/L   Potassium 4.4  3.5 - 5.1 mEq/L   Chloride 106  96 - 112 mEq/L   CO2 27  19 - 32 mEq/L   Glucose, Bld 98  70 - 99 mg/dL   BUN 14  6 - 23 mg/dL   Creatinine, Ser 5.78  0.50 - 1.10 mg/dL   Calcium 9.5  8.4 - 46.9 mg/dL   Total Protein 6.6  6.0 - 8.3 g/dL   Albumin 3.1 (*) 3.5 - 5.2 g/dL   AST 60 (*) 0 - 37 U/L   ALT 86 (*) 0 - 35 U/L   Alkaline Phosphatase 101  39 - 117 U/L   Total Bilirubin 0.4  0.3 - 1.2 mg/dL   GFR calc non Af Amer 76 (*) >90 mL/min   GFR calc Af Amer 88 (*) >90 mL/min   Comment:            The eGFR has been calculated     using the CKD EPI equation.     This calculation has not been     validated in all clinical     situations.     eGFR's persistently      <90 mL/min signify     possible Chronic Kidney Disease.     HEENT: Left lower facial droop Cardio: RRR and no extra sounds Resp: CTA B/L and unlabored GI: BS positive and non tender Extremity:  Pulses positive and No Edema Skin:   Intact and Other no pressure areas Neuro: Lethargic, Flat, Cranial Nerve Abnormalities L central VII, Abnormal Sensory Absent to LT, Proprio, Abnormal Motor 0/5 in LUE and LLE, Abnormal FMC Ataxic/ dec FMC, Reflexes: 0, Tone:  Hypotonia, Dysarthric and Inattention Musc/Skel:  Other no pain with Left shoulder ROM Gen NAD   Assessment/Plan: 1. Functional deficits secondary to Right MCA infarct with hemorrhage which require 3+ hours per day of interdisciplinary therapy in a comprehensive inpatient rehab setting. Physiatrist is providing close team supervision and 24 hour management of active medical problems listed below. Physiatrist and rehab team continue to assess barriers to discharge/monitor patient progress toward functional and medical goals. FIM: FIM - Bathing Bathing Steps Patient Completed: Chest;Left Arm;Abdomen;Right upper leg Bathing: 2: Max-Patient completes 3-4 84f 10 parts or 25-49%  FIM - Upper Body Dressing/Undressing Upper body dressing/undressing steps patient completed: Pull shirt over trunk Upper body dressing/undressing: 2: Max-Patient completed 25-49% of tasks FIM - Lower Body Dressing/Undressing Lower body dressing/undressing: 2: Max-Patient completed 25-49% of tasks        FIM - Bed/Chair Transfer Bed/Chair Transfer Assistive Devices: Bed rails;HOB elevated;Arm rests Bed/Chair Transfer: 2: Chair or W/C > Bed: Max A (lift and lower assist);2: Supine > Sit: Max A (lifting assist/Pt. 25-49%);2: Bed > Chair or W/C: Max A (lift and lower assist)  FIM - Locomotion: Wheelchair Distance: 20 Locomotion: Wheelchair: 1: Travels less than  50 ft with maximal assistance (Pt: 25 - 49%) FIM - Locomotion: Ambulation Ambulation/Gait  Assistance: Not tested (comment) Locomotion: Ambulation: 0: Activity did not occur  Comprehension Comprehension Mode: Auditory Comprehension: 4-Understands basic 75 - 89% of the time/requires cueing 10 - 24% of the time  Expression Expression Mode: Verbal Expression: 4-Expresses basic 75 - 89% of the time/requires cueing 10 - 24% of the time. Needs helper to occlude trach/needs to repeat words.  Social Interaction Social Interaction: 4-Interacts appropriately 75 - 89% of the time - Needs redirection for appropriate language or to initiate interaction.  Problem Solving Problem Solving: 3-Solves basic 50 - 74% of the time/requires cueing 25 - 49% of the time  Memory Memory: 4-Recognizes or recalls 75 - 89% of the time/requires cueing 10 - 24% of the time  Medical Problem List and Plan:  1. Partially hemorrhagic right MCA infarct superimposed upon remote right brain infarct with dense left hemiparesthesias and visual spatial deficits  2. DVT Prophylaxis/Anticoagulation: Subcutaneous Lovenox. Monitor platelet counts and any signs of bleeding  3. Pain Management: Ultram as needed. Monitor with increased mobility  4. Neuropsych: This patient is not capable of making decisions on his/her own behalf.  5. Seizure disorder. Keppra 500 mg every 12 hours. EEG negative  6. Hypertension. Avapro 150 mg daily. Monitor with increased activity 7.  Dysphagia, pocketing soft textures and staff supervision  LOS (Days) 2 A FACE TO FACE EVALUATION WAS PERFORMED  KIRSTEINS,ANDREW E 09/24/2012, 9:15 AM

## 2012-09-24 NOTE — Progress Notes (Signed)
Occupational Therapy Session Note  Patient Details  Name: Stephanie Mason MRN: [id number] Date of Birth: [birth year]  Today's Date: 09/24/2012 Time: 0903-1000 Time Calculation (min): 57 min  Short Term Goals: Week 1:  OT Short Term Goal 1 (Week 1): Pt will maintain static sitting with supervision for 2 mins in preparation for selfcare tasks. OT Short Term Goal 2 (Week 1): Pt will complete bathing sit to stand with mod assist. OT Short Term Goal 3 (Week 1): Pt will locate 3 grooming/bathing items left of midline with no more than mod questioning cues. OT Short Term Goal 4 (Week 1): Pt will donn a pullover shirt with min assist and min instructional cueing to follow hemi techniques. OT Short Term Goal 5 (Week 1): Pt will perform toilet transfer stand pivot with mod assist to 3:1.  Skilled Therapeutic Interventions/Progress Updates:    No c/o pain. Pt stated that her NT had bathed her this am and applied her shirt.  Pt seen for sitting balance on EOB, LB dressing from supine, transfers, and LUE guided movement on towel.  Pt was able to bridge hips with min assist to enable her to assist with pulling her pants up.  She rolled to sidelying and sat up with min assist.  Sitting balance was much improved as she was able to sit at EOB with supervision.  She completed squat pivot to w/c with mod assist and was able to stand up at sink with min-mod assist with LUE weight bearing on sink. Pt needed facilitation through LLE to maintain leg stability.  Therapy Documentation Precautions:  Precautions Precautions: Fall Precaution Comments: dense L hemiplegia, incresed extensor tone LLE, left neglect Restrictions Weight Bearing Restrictions: No   ADL: See FIM for current functional status  Therapy/Group: Individual Therapy  SAGUIER,JULIA 09/24/2012, 10:15 AM

## 2012-09-24 NOTE — Progress Notes (Signed)
Speech Language Pathology Daily Session Note  Patient Details  Name: Stephanie Mason MRN: [id number] Date of Birth: 02-06-1951  Today's Date: 09/24/2012 Time: 1000-1030 Time Calculation (min): 30 min  Short Term Goals: Week 1: SLP Short Term Goal 1 (Week 1): Patient will consume Dys.3 textures and thin liquids with supervision vebral cues to utilize safe swallow compensatory strategies. SLP Short Term Goal 2 (Week 1): Patient will attend to basic task for 2-3 minutes with mod assit verbal cues for re-direction. SLP Short Term Goal 3 (Week 1): Patient will request help as needed with various tasks with mod assist question cues. SLP Short Term Goal 4 (Week 1): Patient will demonstrate basic problem solving during functional tasks with mod assist verbal, visual and tactile cues. SLP Short Term Goal 5 (Week 1): Patient will utilize speech intelligiblty strategies duirng verbal epression with min assist verbal and non-verbal cues.  Skilled Therapeutic Interventions: Skilled treatment session focused on addressing cognitive-linguistic goals.  SLP facilitated session by reducing environmental distractions and presenting patient with a picture sequencing task that addressed left attention/scanning, problem solving and speech intelligibility.  Patient required max assist verbal cues to attend/scan to left of field and mod assist questions cues to problem solve and correctly sequence steps within basic, familiar tasks.  Patient required min assist verbal cues to increase vocal intensity during structured activity.   FIM:  Comprehension Comprehension Mode: Auditory Comprehension: 4-Understands basic 75 - 89% of the time/requires cueing 10 - 24% of the time Expression Expression Mode: Verbal Expression: 4-Expresses basic 75 - 89% of the time/requires cueing 10 - 24% of the time. Needs helper to occlude trach/needs to repeat words. Social Interaction Social Interaction: 4-Interacts appropriately 75 -  89% of the time - Needs redirection for appropriate language or to initiate interaction. Problem Solving Problem Solving: 3-Solves basic 50 - 74% of the time/requires cueing 25 - 49% of the time Memory Memory: 4-Recognizes or recalls 75 - 89% of the time/requires cueing 10 - 24% of the time FIM - Eating Eating Activity: 5: Set-up assist for open containers;5: Supervision/cues;5: Set-up assist for cut food  Pain Pain Assessment Pain Assessment: No/denies pain  Therapy/Group: Individual Therapy  Charlane Ferretti., CCC-SLP [phone]  Stephanie Mason 09/24/2012, 1:21 PM

## 2012-09-25 ENCOUNTER — Inpatient Hospital Stay (HOSPITAL_COMMUNITY): Payer: BC Managed Care – PPO | Admitting: Speech Pathology

## 2012-09-25 ENCOUNTER — Inpatient Hospital Stay (HOSPITAL_COMMUNITY): Payer: BC Managed Care – PPO | Admitting: *Deleted

## 2012-09-25 ENCOUNTER — Inpatient Hospital Stay (HOSPITAL_COMMUNITY): Payer: BC Managed Care – PPO | Admitting: Occupational Therapy

## 2012-09-25 NOTE — Progress Notes (Signed)
Physical Therapy Session Note  Patient Details  Name: Stephanie Mason MRN: [id number] Date of Birth: January 20, 1951  Today's Date: 09/25/2012 Time: 1100-1130 and 4098-1191 Time Calculation (min): 30 min and 45 min  Short Term Goals: Week 1:  PT Short Term Goal 1 (Week 1): Patient will perform squat pivot transfers with mod assist. PT Short Term Goal 2 (Week 1): Patient will perform bed mobility with mod assist. PT Short Term Goal 3 (Week 1): Patient will propel 68' in wheelchair using hemi technique with mod assist. PT Short Term Goal 4 (Week 1): Patient will ambulate 20' with LRAD and max assist.  Skilled Therapeutic Interventions/Progress Updates:    AM Session: Patient received sitting in wheelchair. Today's session focused on stair negotiation and pre-gait activities. Patient with L DF ace wrap donned for entire session. Patient negotiated one step to facilitate increased weight bearing through L LE, see details below. Patient performed pre-gait activities with R handrail and max assist: lateral weight shifts and forward and retro stepping with R LE to increase weight bearing through L LE. Patient requires assist and verbal cues for upright posture, midline orientation, stabilization and weight bearing of L LE. Patient instructed in wheelchair mobility x75', see details below.  Patient handed off to diner[employer] club seated in wheelchair with seatbelt donned.  PM Session: Patient received supine in bed. Patient supine>sit with bedrails and max assist. Patient performs squat pivot transfer bed>wheelchair, going L, with max assist. Patient instructed in wheelchair mobility x75' with supervision-mod assist required for negotiation of obstacles on L. Patient transferred to mat and performed 10 bridges with 3" hold with stabilization of L LE to increase weight bearing. Patient with incontinent episode, returned to room. Patient stood at sink and requires +2 assist for lower body dressing, hygiene, and  donning of brief. Patient left seated in wheelchair with seatbelt donned and all needs within reach.  Therapy Documentation Precautions:  Precautions Precautions: Fall Precaution Comments: dense L hemiplegia, incresed extensor tone LLE, left neglect Restrictions Weight Bearing Restrictions: No Pain: Pain Assessment Pain Assessment: No/denies pain Pain Score: 0-No pain Locomotion : Ambulation Ambulation/Gait Assistance: Not tested (comment) Stairs / Additional Locomotion Stairs: Yes Stairs Assistance: 2: Max Estate agent Assistance Details: Manual facilitation for weight shifting;Manual facilitation for placement;Manual facilitation for weight bearing;Verbal cues for precautions/safety;Verbal cues for technique;Verbal cues for sequencing;Tactile cues for posture Stairs Assistance Details (indicate cue type and reason): Patient requires max assist for upright posture, midline orientation, and advancement, placement, and stabilization of L LE. Stair Management Technique: One rail Right;Backwards;Forwards;Step to pattern;Other (comment) (Patient ascends forwards and descends backwards) Number of Stairs: 1 Wheelchair Mobility Wheelchair Mobility: Yes Wheelchair Assistance: 3: Mod assist Wheelchair Assistance Details: Tactile cues for initiation;Manual facilitation for placement;Verbal cues for sequencing;Verbal cues for technique;Verbal cues for precautions/safety;Verbal cues for safe use of DME/AE Wheelchair Propulsion: Right upper extremity;Right lower extremity Wheelchair Parts Management: Needs assistance Distance: 90' x2   See FIM for current functional status  Therapy/Group: Individual Therapy  Chipper Herb. Jannifer Fischler, PT, DPT  09/25/2012, 12:27 PM

## 2012-09-25 NOTE — Progress Notes (Signed)
PPatient ID: Stephanie Mason, female   DOB: March 03, 1951, 62 y.o.   MRN: [id number] Keosha Rossa is a 62 y.o. 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 Admitted 09/15/2012 to [facility] with left-sided weakness. Patient with noted seizure in the ED and received Ativan as well as loaded with Keppra. MRI of the brain showed large acute partially hemorrhagic right hemispheric infarct superimposed upon remote infarct. Patient did receive TPA. Echocardiogram with normal left ventricular function no source of embolus. EEG showed no seizure activity. Carotid Dopplers with no ICA stenosis. TCD with right terminal ICA stenosis. Patient was transferred to [facility] for ongoing evaluation. Placed on aspirin therapy for stroke prophylaxis as well as subcutaneous Lovenox for DVT prophylaxis. TEE completed 09/22/2012 showing no PFO or thrombus. Maintained on a dysphagia 3 thin liquid diet. Chest x-ray with questionable mass that was ruled out with CT of the chest  Subjective/Complaints: Left shoulder pain relieved with sports creme and Kpad  Objective: Vital Signs: Blood pressure 110/58, pulse 59, temperature 97.6 F (36.4 C), temperature source Oral, resp. rate 18, height 5\' 7"  (1.702 m), weight 71.6 kg (157 lb 13.6 oz), SpO2 95.00%. No results found. Results for orders placed during the hospital encounter of 09/22/12 (from the past 72 hour(s))  CBC     Status: Abnormal   Collection Time    09/22/12  5:38 PM      Result Value Range   WBC 5.1  4.0 - 10.5 K/uL   RBC 3.83 (*) 3.87 - 5.11 MIL/uL   Hemoglobin 12.9  12.0 - 15.0 g/dL   HCT 78.2  95.6 - 21.3 %   MCV 101.8 (*) 78.0 - 100.0 fL   MCH 33.7  26.0 - 34.0 pg   MCHC 33.1  30.0 - 36.0 g/dL   RDW 08.6  57.8 - 46.9 %   Platelets 250  150 - 400 K/uL  CREATININE, SERUM     Status: None   Collection Time    09/22/12  5:38 PM      Result Value Range   Creatinine, Ser 0.73  0.50 -  1.10 mg/dL   GFR calc non Af Amer >90  >90 mL/min   GFR calc Af Amer >90  >90 mL/min   Comment:            The eGFR has been calculated     using the CKD EPI equation.     This calculation has not been     validated in all clinical     situations.     eGFR's persistently     <90 mL/min signify     possible Chronic Kidney Disease.  CBC WITH DIFFERENTIAL     Status: Abnormal   Collection Time    09/23/12  6:10 AM      Result Value Range   WBC 4.4  4.0 - 10.5 K/uL   RBC 3.96  3.87 - 5.11 MIL/uL   Hemoglobin 13.4  12.0 - 15.0 g/dL   HCT 62.9  52.8 - 41.3 %   MCV 101.8 (*) 78.0 - 100.0 fL   MCH 33.8  26.0 - 34.0 pg   MCHC 33.3  30.0 - 36.0 g/dL   RDW 24.4  01.0 - 27.2 %   Platelets 243  150 - 400 K/uL   Neutrophils Relative 64  43 - 77 %   Neutro Abs 2.8  1.7 - 7.7 K/uL   Lymphocytes Relative 23  12 - 46 %   Lymphs Abs 1.0  0.7 - 4.0 K/uL   Monocytes Relative 10  3 - 12 %   Monocytes Absolute 0.5  0.1 - 1.0 K/uL   Eosinophils Relative 2  0 - 5 %   Eosinophils Absolute 0.1  0.0 - 0.7 K/uL   Basophils Relative 1  0 - 1 %   Basophils Absolute 0.0  0.0 - 0.1 K/uL  COMPREHENSIVE METABOLIC PANEL     Status: Abnormal   Collection Time    09/23/12  6:10 AM      Result Value Range   Sodium 142  135 - 145 mEq/L   Potassium 4.4  3.5 - 5.1 mEq/L   Chloride 106  96 - 112 mEq/L   CO2 27  19 - 32 mEq/L   Glucose, Bld 98  70 - 99 mg/dL   BUN 14  6 - 23 mg/dL   Creatinine, Ser 1.61  0.50 - 1.10 mg/dL   Calcium 9.5  8.4 - 09.6 mg/dL   Total Protein 6.6  6.0 - 8.3 g/dL   Albumin 3.1 (*) 3.5 - 5.2 g/dL   AST 60 (*) 0 - 37 U/L   ALT 86 (*) 0 - 35 U/L   Alkaline Phosphatase 101  39 - 117 U/L   Total Bilirubin 0.4  0.3 - 1.2 mg/dL   GFR calc non Af Amer 76 (*) >90 mL/min   GFR calc Af Amer 88 (*) >90 mL/min   Comment:            The eGFR has been calculated     using the CKD EPI equation.     This calculation has not been     validated in all clinical     situations.     eGFR's  persistently     <90 mL/min signify     possible Chronic Kidney Disease.     HEENT: Left lower facial droop Cardio: RRR and no extra sounds Resp: CTA B/L and unlabored GI: BS positive and non tender Extremity:  Pulses positive and No Edema Skin:   Intact and Other no pressure areas Neuro: Lethargic, Flat, Cranial Nerve Abnormalities L central VII, Abnormal Sensory Absent to LT, Proprio, Abnormal Motor 0/5 in LUE and LLE, Abnormal FMC Ataxic/ dec FMC, Reflexes: 0, Tone:  Hypotonia, Dysarthric and Inattention Musc/Skel:  Other no pain with Left shoulder ROM Gen NAD   Assessment/Plan: 1. Functional deficits secondary to Right MCA infarct with hemorrhage which require 3+ hours per day of interdisciplinary therapy in a comprehensive inpatient rehab setting. Physiatrist is providing close team supervision and 24 hour management of active medical problems listed below. Physiatrist and rehab team continue to assess barriers to discharge/monitor patient progress toward functional and medical goals. FIM: FIM - Bathing Bathing Steps Patient Completed: Chest;Left Arm;Abdomen;Right upper leg Bathing: 2: Max-Patient completes 3-4 36f 10 parts or 25-49%  FIM - Upper Body Dressing/Undressing Upper body dressing/undressing steps patient completed: Pull shirt over trunk Upper body dressing/undressing: 2: Max-Patient completed 25-49% of tasks FIM - Lower Body Dressing/Undressing Lower body dressing/undressing: 2: Max-Patient completed 25-49% of tasks     FIM - Archivist Transfers: 1-Two helpers  FIM - Banker Devices: Arm rests Bed/Chair Transfer: 2: Chair or W/C > Bed: Max A (lift and lower assist);2: Bed > Chair or W/C: Max A (lift and lower assist)  FIM - Locomotion: Wheelchair Distance: 25 Locomotion: Wheelchair: 1: Travels less than 50 ft  with maximal assistance (Pt: 25 - 49%) FIM - Locomotion: Ambulation Locomotion: Ambulation  Assistive Devices: Other (comment) (R handrail) Ambulation/Gait Assistance: 1: +2 Total assist Locomotion: Ambulation: 1: Two helpers  Comprehension Comprehension Mode: Auditory Comprehension: 4-Understands basic 75 - 89% of the time/requires cueing 10 - 24% of the time  Expression Expression Mode: Verbal Expression: 4-Expresses basic 75 - 89% of the time/requires cueing 10 - 24% of the time. Needs helper to occlude trach/needs to repeat words.  Social Interaction Social Interaction: 4-Interacts appropriately 75 - 89% of the time - Needs redirection for appropriate language or to initiate interaction.  Problem Solving Problem Solving: 3-Solves basic 50 - 74% of the time/requires cueing 25 - 49% of the time  Memory Memory: 4-Recognizes or recalls 75 - 89% of the time/requires cueing 10 - 24% of the time  Medical Problem List and Plan:  1. Partially hemorrhagic right MCA infarct superimposed upon remote right brain infarct with dense left hemiparesthesias and visual spatial deficits  2. DVT Prophylaxis/Anticoagulation: Subcutaneous Lovenox. Monitor platelet counts and any signs of bleeding  3. Pain Management: Ultram as needed. Monitor with increased mobility  4. Neuropsych: This patient is not capable of making decisions on his/her own behalf.  5. Seizure disorder. Keppra 500 mg every 12 hours. EEG negative  6. Hypertension. Avapro 150 mg daily. Monitor with increased activity 7.  Dysphagia, pocketing soft textures and staff supervision 8.  Macrocytosis with borderline low RBCs await B12, folate, admits to 1 beer a day at home LOS (Days) 3 A FACE TO FACE EVALUATION WAS PERFORMED  Dachelle Molzahn E 09/25/2012, 9:24 AM

## 2012-09-25 NOTE — Progress Notes (Signed)
Speech Language Pathology Daily Session Note  Patient Details  Name: Tawn Fitzner MRN: [id number] Date of Birth: 02-04-1951  Today's Date: 09/25/2012 Time: 1130-1145 Time Calculation (min): 15 min  Short Term Goals: Week 1: SLP Short Term Goal 1 (Week 1): Patient will consume Dys.3 textures and thin liquids with supervision vebral cues to utilize safe swallow compensatory strategies. SLP Short Term Goal 2 (Week 1): Patient will attend to basic task for 2-3 minutes with mod assit verbal cues for re-direction. SLP Short Term Goal 3 (Week 1): Patient will request help as needed with various tasks with mod assist question cues. SLP Short Term Goal 4 (Week 1): Patient will demonstrate basic problem solving during functional tasks with mod assist verbal, visual and tactile cues. SLP Short Term Goal 5 (Week 1): Patient will utilize speech intelligiblty strategies duirng verbal epression with min assist verbal and non-verbal cues.  Skilled Therapeutic Interventions: Group, co-treatment session with OT focused on addressing dysphagia goals and self-feeding. Patient consumed Dys.3 textures and thin liquids with set up assist and min assist verbal cues to utilize safe swallow compensatory strategies. Patient demonstrated no overt s/s of aspiration.    FIM:  Comprehension Comprehension: 5-Follows basic conversation/direction: With no assist Expression Expression: 5-Expresses basic needs/ideas: With no assist FIM - Eating Eating Activity: 5: Supervision/cues;5: Set-up assist for open containers  Pain Pain Assessment Pain Assessment: No/denies pain  Therapy/Group: Individual Therapy  Averee Harb 09/25/2012, 4:25 PM

## 2012-09-25 NOTE — Progress Notes (Signed)
Occupational Therapy Session Note  Patient Details  Name: Addysyn Fern MRN: [id number] Date of Birth: 1950/11/25  Today's Date: 09/25/2012 Time: 1000-1100 Time Calculation (min): 60 min  Short Term Goals: Week 1:  OT Short Term Goal 1 (Week 1): Pt will maintain static sitting with supervision for 2 mins in preparation for selfcare tasks. OT Short Term Goal 2 (Week 1): Pt will complete bathing sit to stand with mod assist. OT Short Term Goal 3 (Week 1): Pt will locate 3 grooming/bathing items left of midline with no more than mod questioning cues. OT Short Term Goal 4 (Week 1): Pt will donn a pullover shirt with min assist and min instructional cueing to follow hemi techniques. OT Short Term Goal 5 (Week 1): Pt will perform toilet transfer stand pivot with mod assist to 3:1.  Skilled Therapeutic Interventions/Progress Updates:    Pt seen for B/D at shower level with a focus on transfers, LUE management, and sitting balance.  Pt transferred out of bed with mod assist, but then needed max assist on and off BSC in shower.  She was having a great deal of difficulty with attention to left, leaning to left and trying to push hips forward (versus leaning forward from trunk).  After shower, pt worked on sit to stand at sink with mod facilitation to keep trunk forward and stabilize her LLE.  Pt worked on [employer] dressing with mod assist and demonstrated improved attention to LUE.  Therapy Documentation Precautions:  Precautions Precautions: Fall Precaution Comments: dense L hemiplegia, incresed extensor tone LLE, left neglect Restrictions Weight Bearing Restrictions: No     ADL: See FIM for current functional status  Therapy/Group: Individual Therapy  SAGUIER,JULIA 09/25/2012, 12:10 PM

## 2012-09-25 NOTE — Progress Notes (Signed)
Occupational Therapy Note  Patient Details  Name: Stephanie Mason MRN: [id number] Date of Birth: [birth year] Today's Date: 09/25/2012  [employer] with SLP 11:45-12:15 No c/o pain in session Focus on self feeding with use of bilateral UE to setup tray with left UE as stabilizer, recall and demonstration of safe swallowing precautions with min questioning cues for oral pocketing in left cheek, selective attention in a minimal distracting environment, encouragement of PO intake.    Roney Mans [facility] 09/25/2012, 4:03 PM

## 2012-09-25 NOTE — Progress Notes (Signed)
Speech Language Pathology Daily Session Note  Patient Details  Name: Stephanie Mason MRN: [id number] Date of Birth: 03/07/1951  Today's Date: 09/25/2012 Time: 4098-1191 Time Calculation (min): 30 min  Short Term Goals: Week 1: SLP Short Term Goal 1 (Week 1): Patient will consume Dys.3 textures and thin liquids with supervision vebral cues to utilize safe swallow compensatory strategies. SLP Short Term Goal 2 (Week 1): Patient will attend to basic task for 2-3 minutes with mod assit verbal cues for re-direction. SLP Short Term Goal 3 (Week 1): Patient will request help as needed with various tasks with mod assist question cues. SLP Short Term Goal 4 (Week 1): Patient will demonstrate basic problem solving during functional tasks with mod assist verbal, visual and tactile cues. SLP Short Term Goal 5 (Week 1): Patient will utilize speech intelligiblty strategies duirng verbal epression with min assist verbal and non-verbal cues.  Skilled Therapeutic Interventions: Skilled treatment session focused on addressing cognitive goals during functional tasks.  SLP facilitated session by reducing environmental distractions and presenting patient with basic-moderately difficult daily math tasks.   Patient required mod assist verbal cues to attend/scan to left of field and max assist questions cues to problem solve with money, recipe and time calculation tasks.  Patient demonstrated limited awareness of deficits; however, after SLP verbal feedback patient was able to identify that her boyfriend could assist her with financial manage tasks following discharge.    FIM:  Comprehension Comprehension Mode: Auditory Comprehension: 4-Understands basic 75 - 89% of the time/requires cueing 10 - 24% of the time Expression Expression Mode: Verbal Expression: 4-Expresses basic 75 - 89% of the time/requires cueing 10 - 24% of the time. Needs helper to occlude trach/needs to repeat words. Social Interaction Social  Interaction: 4-Interacts appropriately 75 - 89% of the time - Needs redirection for appropriate language or to initiate interaction. Problem Solving Problem Solving: 3-Solves basic 50 - 74% of the time/requires cueing 25 - 49% of the time Memory Memory: 4-Recognizes or recalls 75 - 89% of the time/requires cueing 10 - 24% of the time  Pain Pain Assessment Pain Assessment: Yes Pain Score: 0-No pain Pain Type: Chronic pain Pain Location: Neck Pain Orientation: Left Pain Descriptors: Tender;Tightness;Aching Patients Stated Pain Goal: 2 Pain Intervention(s): Medication (See eMAR);Repositioned;Heat applied;Massage Multiple Pain Sites: No  Therapy/Group: Individual Therapy  Charlane Ferretti., CCC-SLP [phone]  Joevon Holliman 09/25/2012, 12:29 PM

## 2012-09-26 ENCOUNTER — Inpatient Hospital Stay (HOSPITAL_COMMUNITY): Payer: BC Managed Care – PPO | Admitting: Speech Pathology

## 2012-09-26 ENCOUNTER — Inpatient Hospital Stay (HOSPITAL_COMMUNITY): Payer: BC Managed Care – PPO | Admitting: *Deleted

## 2012-09-26 DIAGNOSIS — R569 Unspecified convulsions: Secondary | ICD-10-CM

## 2012-09-26 DIAGNOSIS — I6330 Cerebral infarction due to thrombosis of unspecified cerebral artery: Secondary | ICD-10-CM

## 2012-09-26 NOTE — Progress Notes (Signed)
Speech Language Pathology Daily Session Notes  Patient Details  Name: Stephanie Mason MRN: [id number] Date of Birth: 25-Nov-1950  Today's Date: 09/26/2012  Session 1 Time: 1015-1100 Time Calculation (min): 45 min  Session 2 Time: 1130-1145 Time Calculation: 15 min  Short Term Goals: Week 1: SLP Short Term Goal 1 (Week 1): Patient will consume Dys.3 textures and thin liquids with supervision vebral cues to utilize safe swallow compensatory strategies. SLP Short Term Goal 2 (Week 1): Patient will attend to basic task for 2-3 minutes with mod assit verbal cues for re-direction. SLP Short Term Goal 3 (Week 1): Patient will request help as needed with various tasks with mod assist question cues. SLP Short Term Goal 4 (Week 1): Patient will demonstrate basic problem solving during functional tasks with mod assist verbal, visual and tactile cues. SLP Short Term Goal 5 (Week 1): Patient will utilize speech intelligiblty strategies duirng verbal epression with min assist verbal and non-verbal cues.  Skilled Therapeutic Interventions:  Session 1: Skilled treatment session focused on addressing cognitive-linguistic goals. SLP facilitated session by reducing environmental distractions and presenting patient with a 4 and 6 step picture sequencing task that addressed left attention/scanning, problem solving and speech intelligibility. Patient required mod assist verbal cues to attend/scan to left of field and mod assist questions cues for problem solving/sequencing steps within basic, familiar tasks and to self-monitor and correct errors. Patient required min assist verbal cues to increase vocal intensity during structured activity. Pt also required Mod A question and visual cues to locate lunch items in the menu and required Min A verbal cues for increased vocal intensity while ordering her meal via phone.    Session 2: Group, co-treatment session with OT focused on addressing dysphagia goals and  self-feeding. Patient consumed Dys.3 textures and thin liquids with set up assist. Pt attended to left buccal pocketing with Mod I but required supervision visual cues to attend to left anterior spillage. Patient exhibited no overt s/s of aspiration.   FIM:  Comprehension Comprehension Mode: Auditory Comprehension: 5-Follows basic conversation/direction: With extra time/assistive device Expression Expression Mode: Verbal Expression: 4-Expresses basic 75 - 89% of the time/requires cueing 10 - 24% of the time. Needs helper to occlude trach/needs to repeat words. Social Interaction Social Interaction: 4-Interacts appropriately 75 - 89% of the time - Needs redirection for appropriate language or to initiate interaction. Problem Solving Problem Solving: 3-Solves basic 50 - 74% of the time/requires cueing 25 - 49% of the time Memory Memory: 4-Recognizes or recalls 75 - 89% of the time/requires cueing 10 - 24% of the time FIM - Eating Eating Activity: 5: Set-up assist for open containers;5: Supervision/cues  Pain Pain Assessment Pain Assessment: No/denies pain  Therapy/Group: Individual Therapy and Group Therapy  Stephanie Mason 09/26/2012, 1:05 PM

## 2012-09-26 NOTE — Progress Notes (Signed)
Occupational Therapy Session Note  Patient Details  Name: Stephanie Mason MRN: [id number] Date of Birth: [birth year]  Today's Date: 09/26/2012 Time: 4098-1191 Time Calculation (min): 30 min  Short Term Goals:  Skilled Therapeutic Interventions/Progress Updates: participated in [employer] and demonstrated selective attention; L/R scanning and small bites     Therapy Documentation Precautions:  Precautions Precautions: Fall Precaution Comments: dense L hemiplegia, incresed extensor tone LLE, left neglect Restrictions Weight Bearing Restrictions: No  Pain: Pain Assessment Pain Assessment: No/denies pain Pain Score: 0-No pain Pain Type: Acute pain Pain Location: Shoulder Pain Orientation: Left Pain Intervention(s): Medication (See eMAR)   See FIM for current functional status  Therapy/Group: Group Therapy  Rozelle Logan 09/26/2012, 4:44 PM

## 2012-09-26 NOTE — Progress Notes (Signed)
PPatient ID: Stephanie Mason, female   DOB: [birth year], 62 y.o.   MRN: [id number] Stephanie Mason is a 62 y.o. 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 Admitted 09/15/2012 to [facility] with left-sided weakness. Patient with noted seizure in the ED and received Ativan as well as loaded with Keppra. MRI of the brain showed large acute partially hemorrhagic right hemispheric infarct superimposed upon remote infarct. Patient did receive TPA. Echocardiogram with normal left ventricular function no source of embolus. EEG showed no seizure activity. Carotid Dopplers with no ICA stenosis. TCD with right terminal ICA stenosis. Patient was transferred to [facility] for ongoing evaluation. Placed on aspirin therapy for stroke prophylaxis as well as subcutaneous Lovenox for DVT prophylaxis. TEE completed 09/22/2012 showing no PFO or thrombus. Maintained on a dysphagia 3 thin liquid diet. Chest x-ray with questionable mass that was ruled out with CT of the chest  Subjective/Complaints: She has no specific complaints today other than a little discomfort in the supraclavicular area on the left. She denies any rashes. No pain with movement. No pain with pressure.  Objective: Vital Signs: Blood pressure 118/73, pulse 56, temperature 98.2 F (36.8 C), temperature source Oral, resp. rate 18, height 5\' 7"  (1.702 m), weight 157 lb 13.6 oz (71.6 kg), SpO2 97.00%.  No acute distress. She seems to have a flat affect. HEENT exam atraumatic, normocephalic. Neck is supple. There is no tenderness to palpation of the shoulder or supraclavicular area. Passive range of motion of the left shoulder is normal. Chest is clear to auscultation without increased work of breathing. Cardiac exam S1-S2 are regular. Abdominal exam active bowel sounds, soft. Extremities no edema. Assessment/Plan: 1. Functional deficits secondary to Right MCA infarct with hemorrhage   Medical Problem List and Plan:  1. Partially hemorrhagic right MCA infarct superimposed upon remote right brain infarct with dense left hemiparesthesias and visual spatial deficits  2. DVT Prophylaxis/Anticoagulation: Subcutaneous Lovenox. Monitor platelet counts and any signs of bleeding  3. Pain Management: Ultram as needed. Monitor with increased mobility  4. Neuropsych: This patient is not capable of making decisions on his/her own behalf.  5. Seizure disorder. Keppra 500 mg every 12 hours. EEG negative  6. Hypertension. Avapro 150 mg daily. Monitor with increased activity. Blood pressures are adequately treated at this time. 7.  Dysphagia, pocketing soft textures and staff supervision 8.  Macrocytosis with borderline low RBCs await B12, folate, 09/26/2012- still pending LOS (Days) 4 A FACE TO FACE EVALUATION WAS PERFORMED  Stephanie Mason 09/26/2012, 9:23 AM

## 2012-09-26 NOTE — Progress Notes (Signed)
Physical Therapy Session Note  Patient Details  Name: Stephanie Mason MRN: [id number] Date of Birth: [birth year]  Today's Date: 09/26/2012 Time: 1340-1430 Time Calculation (min): 50 min    Skilled Therapeutic Interventions W/C management , able to propel herself less than 50 feet with mod A due to L side deficit and inability to maneuver chair. Cues and training provided to increase ability to propel the chair with R UE and LE. Transfer training w/c <=>mat , max A patient not able to put weight through L side. Sit to stand transfers in II bars with max A, weight shifting to increase weight bearing on L side, ace wrap an L ankle and splint on L knee to prevent hyperextension. Pre gait training in II bars , x 2 lengths with max A and cues to progress L LE, not able without help. In supine: Bridging 2 x 10, SLR on L using R side as a lift to facilitate ROM and neuro reed, UE PNF techniques. Sitting balance training with emphasis on weight shifting to the left and returning center of mass to mid point. Weight bearaing through L elbow in sitting.     Therapy Documentation Precautions:  Precautions Precautions: Fall Precaution Comments: dense L hemiplegia, incresed extensor tone LLE, left neglect Restrictions Weight Bearing Restrictions: No  Pain: Pain Assessment Pain Assessment: No/denies pain   Therapy/Group: Individual Therapy  Dorna Mai 09/26/2012, 3:05 PM

## 2012-09-26 NOTE — Progress Notes (Signed)
Occupational Therapy Note  Patient Details  Name: Stephanie Mason MRN: [id number] Date of Birth: 07/19/1951 Today's Date: 09/26/2012  0800-0910  (70 min)  Pain:  3/10 left shoulder  Pt lying in bed with CNA eating breakfast.  Completed meal.  Transferred to EOB to finish breakfast.  Trasnferred to wc with max assist and then to toilet.  Pt. Sat EOB with minimal cues for erect posture and decreased leaning to left.  Pt bathed self at sink with assist with RUE, BLE and posterior Peri area.  Dressed with UB= mod a and cues for hemi strategies:  LB with max assist.  Pt. Left in wc with call bell and phone at side.  Safety belt applied.     Humberto Seals 09/26/2012, 9:10 AM

## 2012-09-27 ENCOUNTER — Inpatient Hospital Stay (HOSPITAL_COMMUNITY): Payer: BC Managed Care – PPO | Admitting: Physical Therapy

## 2012-09-27 NOTE — Progress Notes (Signed)
Physical Therapy Note  Patient Details  Name: Maiana Hennigan MRN: [id number] Date of Birth: 01/19/1951 Today's Date: 09/27/2012  1100 -1155 (55 minutes) individual Pain: no reported pain Focus of treatment: Neuro re-ed Left LE; wc mobility training; standing to facilitate weight bearing LT LE Treatment: Pt up in wc upon arrival; transfers scoot to squat/pivot mod assist; Neuro re-ed LT LE- AA hip flexion in sidelying ; bilateral hip flexion/extension using therapy ball; sit to stand X 3 to PFRW ( left) min/mod assist blocking left knee with forward/backward steps RT LE 20 reps; wc mobility - 120 feet using hemi technique RT extremities min to SBA.   Sueanne Maniaci,JIM 09/27/2012, 11:56 AM

## 2012-09-27 NOTE — Progress Notes (Signed)
PPatient ID: Stephanie Mason, female   DOB: Mar 04, 1951, 62 y.o.   MRN: [id number] Stephanie Mason is a 55 y.o. 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 Admitted 09/15/2012 to [facility] with left-sided weakness. Patient with noted seizure in the ED and received Ativan as well as loaded with Keppra. MRI of the brain showed large acute partially hemorrhagic right hemispheric infarct superimposed upon remote infarct. Patient did receive TPA. Echocardiogram with normal left ventricular function no source of embolus. EEG showed no seizure activity. Carotid Dopplers with no ICA stenosis. TCD with right terminal ICA stenosis. Patient was transferred to [facility] for ongoing evaluation. Placed on aspirin therapy for stroke prophylaxis as well as subcutaneous Lovenox for DVT prophylaxis. TEE completed 09/22/2012 showing no PFO or thrombus. Maintained on a dysphagia 3 thin liquid diet. Chest x-ray with questionable mass that was ruled out with CT of the chest  Subjective/Complaints: Feels well today, eating breakfast  ROS: no chest pain, sob, pnd,, no headache  Objective: Vital Signs: Blood pressure 113/74, pulse 67, temperature 98.2 F (36.8 C), temperature source Oral, resp. rate 18, height 5\' 7"  (1.702 m), weight 157 lb 13.6 oz (71.6 kg), SpO2 96.00%.  No acute distress. She seems to have a flat affect. HEENT exam atraumatic, normocephalic. Neck is supple. There is no tenderness to palpation of the shoulder or supraclavicular area. Passive range of motion of the left shoulder is normal. Chest is clear to auscultation without increased work of breathing. Cardiac exam S1-S2 are regular. Abdominal exam active bowel sounds, soft. Extremities no edema. Assessment/Plan: 1. Functional deficits secondary to Right MCA infarct with hemorrhage  Medical Problem List and Plan:  1. Partially hemorrhagic right MCA infarct superimposed upon remote  right brain infarct with dense left hemiparesthesias and visual spatial deficits  2. DVT Prophylaxis/Anticoagulation: Subcutaneous Lovenox. Monitor platelet counts and any signs of bleeding  3. Pain Management: well controlled 4. Neuropsych: This patient is not capable of making decisions on his/her own behalf.  5. Seizure disorder. Keppra 500 mg every 12 hours. EEG negative  6. Hypertension. Avapro 150 mg daily. Monitor with increased activity. Blood pressures are adequately treated at this time. 7.  Dysphagia, pocketing soft textures and staff supervision 8.  Macrocytosis with borderline low RBCs await B12, folate, - in process LOS (Days) 5 A FACE TO FACE EVALUATION WAS PERFORMED  Stephanie Mason 09/27/2012, 9:07 AM

## 2012-09-28 ENCOUNTER — Inpatient Hospital Stay (HOSPITAL_COMMUNITY): Payer: BC Managed Care – PPO | Admitting: Occupational Therapy

## 2012-09-28 ENCOUNTER — Inpatient Hospital Stay (HOSPITAL_COMMUNITY): Payer: BC Managed Care – PPO | Admitting: Speech Pathology

## 2012-09-28 ENCOUNTER — Inpatient Hospital Stay (HOSPITAL_COMMUNITY): Payer: BC Managed Care – PPO | Admitting: *Deleted

## 2012-09-28 DIAGNOSIS — I6350 Cerebral infarction due to unspecified occlusion or stenosis of unspecified cerebral artery: Secondary | ICD-10-CM

## 2012-09-28 LAB — METHYLMALONIC ACID, SERUM: Methylmalonic Acid, Quantitative: 0.31 umol/L (ref <0.40)

## 2012-09-28 MED ORDER — TIZANIDINE HCL 2 MG PO TABS
2.0000 mg | ORAL_TABLET | Freq: Three times a day (TID) | ORAL | Status: DC
  Administered 2012-09-28 – 2012-10-07 (×27): 2 mg via ORAL
  Filled 2012-09-28 (×33): qty 1

## 2012-09-28 NOTE — Progress Notes (Signed)
PPatient ID: Stephanie Mason, female   DOB: 29-Jul-1951, 62 y.o.   MRN: [id number] Stephanie Mason is a 59 y.o. 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 Admitted 09/15/2012 to [facility] with left-sided weakness. Patient with noted seizure in the ED and received Ativan as well as loaded with Keppra. MRI of the brain showed large acute partially hemorrhagic right hemispheric infarct superimposed upon remote infarct. Patient did receive TPA. Echocardiogram with normal left ventricular function no source of embolus. EEG showed no seizure activity. Carotid Dopplers with no ICA stenosis. TCD with right terminal ICA stenosis. Patient was transferred to [facility] for ongoing evaluation. Placed on aspirin therapy for stroke prophylaxis as well as subcutaneous Lovenox for DVT prophylaxis. TEE completed 09/22/2012 showing no PFO or thrombus. Maintained on a dysphagia 3 thin liquid diet. Chest x-ray with questionable mass that was ruled out with CT of the chest  Subjective/Complaints: Snuff dipper craving nicotine.  Discussed cessation as well as nicoderm patch Intermittent spasms of LUE and LLE  Objective: Vital Signs: Blood pressure 102/67, pulse 61, temperature 98.4 F (36.9 C), temperature source Oral, resp. rate 19, height 5\' 7"  (1.702 m), weight 71.6 kg (157 lb 13.6 oz), SpO2 97.00%. No results found. No results found for this or any previous visit (from the past 72 hour(s)).   HEENT: Left lower facial droop Cardio: RRR and no extra sounds Resp: CTA B/L and unlabored GI: BS positive and non tender Extremity:  Pulses positive and No Edema Skin:   Intact and Other no pressure areas Neuro: Lethargic, Flat, Cranial Nerve Abnormalities L central VII, Abnormal Sensory Absent to LT, Proprio, Abnormal Motor 0/5 in LUE and LLE, Abnormal FMC Ataxic/ dec FMC, Reflexes: 0, Tone:  Hypotonia, Dysarthric and Inattention Musc/Skel:  Other no  pain with Left shoulder ROM Gen NAD   Assessment/Plan: 1. Functional deficits secondary to Right MCA infarct with hemorrhage which require 3+ hours per day of interdisciplinary therapy in a comprehensive inpatient rehab setting. Physiatrist is providing close team supervision and 24 hour management of active medical problems listed below. Physiatrist and rehab team continue to assess barriers to discharge/monitor patient progress toward functional and medical goals. FIM: FIM - Bathing Bathing Steps Patient Completed: Chest;Left Arm;Abdomen;Front perineal area;Buttocks;Right upper leg;Left upper leg Bathing: 3: Mod-Patient completes 5-7 47f 10 parts or 50-74%  FIM - Upper Body Dressing/Undressing Upper body dressing/undressing steps patient completed: Put head through opening of pull over shirt/dress;Pull shirt over trunk Upper body dressing/undressing: 3: Mod-Patient completed 50-74% of tasks FIM - Lower Body Dressing/Undressing Lower body dressing/undressing steps patient completed: Thread/unthread right pants leg Lower body dressing/undressing: 1: Total-Patient completed less than 25% of tasks  FIM - Hotel manager Devices: Grab bar or rail for support Toileting: 1: Two helpers  FIM - Archivist Transfers: 1-Two helpers  FIM - Banker Devices: Arm rests Bed/Chair Transfer: 2: Chair or W/C > Bed: Max A (lift and lower assist);2: Supine > Sit: Max A (lifting assist/Pt. 25-49%)  FIM - Locomotion: Wheelchair Distance: 75 Locomotion: Wheelchair: 2: Travels 50 - 149 ft with moderate assistance (Pt: 50 - 74%) FIM - Locomotion: Ambulation Locomotion: Ambulation Assistive Devices: Other (comment) (R handrail) Ambulation/Gait Assistance: Not tested (comment) Locomotion: Ambulation: 0: Activity did not occur  Comprehension Comprehension Mode: Auditory Comprehension: 4-Understands basic 75 - 89% of the time/requires  cueing 10 - 24% of the time  Expression Expression Mode: Verbal Expression: 4-Expresses basic  75 - 89% of the time/requires cueing 10 - 24% of the time. Needs helper to occlude trach/needs to repeat words.  Social Interaction Social Interaction Mode: Asleep Social Interaction: 4-Interacts appropriately 75 - 89% of the time - Needs redirection for appropriate language or to initiate interaction.  Problem Solving Problem Solving Mode: Asleep Problem Solving: 4-Solves basic 75 - 89% of the time/requires cueing 10 - 24% of the time  Memory Memory Mode: Asleep Memory: 4-Recognizes or recalls 75 - 89% of the time/requires cueing 10 - 24% of the time  Medical Problem List and Plan:  1. Partially hemorrhagic right MCA infarct superimposed upon remote right brain infarct with dense left hemiparesthesias and visual spatial deficits , developing intermittent painful spasms will order zanaflex 2. DVT Prophylaxis/Anticoagulation: Subcutaneous Lovenox. Monitor platelet counts and any signs of bleeding  3. Pain Management: Ultram as needed. Monitor with increased mobility  4. Neuropsych: This patient is not capable of making decisions on his/her own behalf.  5. Seizure disorder. Keppra 500 mg every 12 hours. EEG negative  6. Hypertension. Avapro 150 mg daily. Monitor with increased activity 7.  Dysphagia, pocketing soft textures and staff supervision 8.  Macrocytosis with borderline low RBCs await B12, folate, admits to 1 beer a day at home-results still pending LOS (Days) 6 A FACE TO FACE EVALUATION WAS PERFORMED  Brenley Priore E 09/28/2012, 7:31 AM

## 2012-09-28 NOTE — Progress Notes (Signed)
Speech Language Pathology Daily Session Note  Patient Details  Name: Stephanie Mason MRN: [id number] Date of Birth: [birth year]  Today's Date: 09/28/2012 Time: 1130-1145 Time Calculation (min): 15 min  Short Term Goals: Week 1: SLP Short Term Goal 1 (Week 1): Patient will consume Dys.3 textures and thin liquids with supervision vebral cues to utilize safe swallow compensatory strategies. SLP Short Term Goal 2 (Week 1): Patient will attend to basic task for 2-3 minutes with mod assit verbal cues for re-direction. SLP Short Term Goal 3 (Week 1): Patient will request help as needed with various tasks with mod assist question cues. SLP Short Term Goal 4 (Week 1): Patient will demonstrate basic problem solving during functional tasks with mod assist verbal, visual and tactile cues. SLP Short Term Goal 5 (Week 1): Patient will utilize speech intelligiblty strategies duirng verbal epression with min assist verbal and non-verbal cues.  Skilled Therapeutic Interventions: Group, co-treatment session with OT focused on addressing dysphagia goals and self-feeding. Patient consumed Dys.3 textures and thin liquids with set up assist. Pt attended to left buccal pocketing with Min A verbal cues and also required Min visual and verbal cues to attend to left anterior spillage. Patient exhibited no overt s/s of aspiration.    FIM:  Comprehension Comprehension Mode: Auditory Comprehension: 4-Understands basic 75 - 89% of the time/requires cueing 10 - 24% of the time Expression Expression Mode: Verbal Expression: 4-Expresses basic 75 - 89% of the time/requires cueing 10 - 24% of the time. Needs helper to occlude trach/needs to repeat words. Social Interaction Social Interaction: 4-Interacts appropriately 75 - 89% of the time - Needs redirection for appropriate language or to initiate interaction. Problem Solving Problem Solving: 3-Solves basic 50 - 74% of the time/requires cueing 25 - 49% of the  time Memory Memory: 4-Recognizes or recalls 75 - 89% of the time/requires cueing 10 - 24% of the time FIM - Eating Eating Activity: 5: Supervision/cues;4: Helper checks for pocketed food  Pain Pain Assessment Pain Assessment: No/denies pain  Therapy/Group: Group Therapy  Faizan Geraci 09/28/2012, 4:57 PM

## 2012-09-28 NOTE — Progress Notes (Signed)
Occupational Therapy Session Note  Patient Details  Name: Stephanie Mason MRN: [id number] Date of Birth: Jan 07, 1951  Today's Date: 09/28/2012 Time: 1000-1100 Time Calculation (min): 60 min  Short Term Goals: Week 1:  OT Short Term Goal 1 (Week 1): Pt will maintain static sitting with supervision for 2 mins in preparation for selfcare tasks. OT Short Term Goal 2 (Week 1): Pt will complete bathing sit to stand with mod assist. OT Short Term Goal 3 (Week 1): Pt will locate 3 grooming/bathing items left of midline with no more than mod questioning cues. OT Short Term Goal 4 (Week 1): Pt will donn a pullover shirt with min assist and min instructional cueing to follow hemi techniques. OT Short Term Goal 5 (Week 1): Pt will perform toilet transfer stand pivot with mod assist to 3:1.  Skilled Therapeutic Interventions/Progress Updates:      Pt seen for BADL retraining of toileting, bathing, and dressing with a focus on sit to stand and static standing balance, LUE management, and left side awareness. Pt is able to come into a stand at the sink with min assist but needs RUE support to stabilize balance with min assist.  If she lets go with right hand, she will fall to left. In standing she worked on [employer] bearing on sink.  She was also able to complete stand pivot transfer with grab bar with mod assist. She needs mod to max cues to attend to left arm and to pick it up with right arm in preparation for mobility skills.  Therapy Documentation Precautions:  Precautions Precautions: Fall Precaution Comments: dense L hemiplegia, incresed extensor tone LLE, left neglect Restrictions Weight Bearing Restrictions: No      Pain: Pain Assessment Pain Assessment: No/denies pain Pain Score: 0-No pain ADL: See FIM for current functional status  Therapy/Group: Individual Therapy  SAGUIER,JULIA 09/28/2012, 11:54 AM

## 2012-09-28 NOTE — Progress Notes (Signed)
Speech Language Pathology Daily Session Note  Patient Details  Name: Stephanie Mason MRN: [id number] Date of Birth: 03-10-1951  Today's Date: 09/28/2012 Time: 1300-1340 Time Calculation (min): 40 min  Short Term Goals: Week 1: SLP Short Term Goal 1 (Week 1): Patient will consume Dys.3 textures and thin liquids with supervision vebral cues to utilize safe swallow compensatory strategies. SLP Short Term Goal 2 (Week 1): Patient will attend to basic task for 2-3 minutes with mod assit verbal cues for re-direction. SLP Short Term Goal 3 (Week 1): Patient will request help as needed with various tasks with mod assist question cues. SLP Short Term Goal 4 (Week 1): Patient will demonstrate basic problem solving during functional tasks with mod assist verbal, visual and tactile cues. SLP Short Term Goal 5 (Week 1): Patient will utilize speech intelligiblty strategies duirng verbal epression with min assist verbal and non-verbal cues.  Skilled Therapeutic Interventions: Skilled treatment session focused on addressing cognition goals.  SLP facilitated session with basic food prep task and min assist verbal and visual cues to utilize working memory, left attention and problem solving strategies with a familiar task.  SLP set up and provide patient with written directions for instant pudding task and supervision cues throughout session to refer back as she needed to.  SLP also facilitated session with verbal directions to locate 3 item/locations during route finding back to room; patient required max assist cues to attend to left environment, utilize written aids and problem solve route finding.    FIM:  Comprehension Comprehension Mode: Auditory Comprehension: 4-Understands basic 75 - 89% of the time/requires cueing 10 - 24% of the time Expression Expression Mode: Verbal Expression: 4-Expresses basic 75 - 89% of the time/requires cueing 10 - 24% of the time. Needs helper to occlude trach/needs to  repeat words. Social Interaction Social Interaction: 4-Interacts appropriately 75 - 89% of the time - Needs redirection for appropriate language or to initiate interaction. Problem Solving Problem Solving: 3-Solves basic 50 - 74% of the time/requires cueing 25 - 49% of the time Memory Memory: 4-Recognizes or recalls 75 - 89% of the time/requires cueing 10 - 24% of the time  Pain Pain Assessment Pain Assessment: No/denies pain  Therapy/Group: Individual Therapy  Charlane Ferretti., CCC-SLP [phone]  [name] 09/28/2012, 2:37 PM

## 2012-09-28 NOTE — Progress Notes (Signed)
Physical Therapy Session Note  Patient Details  Name: Stephanie Mason MRN: [id number] Date of Birth: [birth year]  Today's Date: 09/28/2012 Time: 0900-0945 and 1430-1500 Time Calculation (min): 45 min and 30 min  Short Term Goals: Week 1:  PT Short Term Goal 1 (Week 1): Patient will perform squat pivot transfers with mod assist. PT Short Term Goal 2 (Week 1): Patient will perform bed mobility with mod assist. PT Short Term Goal 3 (Week 1): Patient will propel 1' in wheelchair using hemi technique with mod assist. PT Short Term Goal 4 (Week 1): Patient will ambulate 44' with LRAD and max assist.  Skilled Therapeutic Interventions/Progress Updates:    AM Session: Patient received supine in bed. Today's session focused on static and dynamic standing and pre-gait activities. Attempted standing in standing frame, but patient too petite and harness did not fully support. Patient performed static and dynamic standing in front of stairs with R UE on railing for support. Patient requires mod assist for sit<>stand with emphasis on slow, controlled descent. Patient performed 2 static stands, each lasting approximately 90", with mod assist for upright posture, midline orientation, and L LE weight bearing and stabilization of L knee secondary to buckling when accepting more weight.  Patient performed pre-gait activities with R UE support on stair railing: lateral weight shifts 3 sets x15 reps, R side stepping 2 sets x10 reps, and mini squats x20 reps with emphasis on L total knee extension. Noted increased tone in L hamstrings. Intermittent L knee buckling with increased weight bearing during pre-gait activities, however, patient demonstrates improved ability to perform active total knee extension in standing.  Patient returned to room and left seated in wheelchair with seatbelt donned and all needs within reach.  PM Session: Patient received sitting in wheelchair. This session focused on gait training and  wheelchair mobility with hemi technique. Patient instructed in gait training x25' with R handrail and max assist required for upright posture, midline orientation, advancement and placement of L LE, stabilization/weight bearing of L knee during stance phase and posterior blocking to prevent L knee genu recurvatum. +2 assist required for wheelchair follow.  Patient instructed in wheelchair mobility x125' with supervision using hemi technique.  Patient returned to room and left seated in wheelchair with seatbelt donned and all needs within reach.   Therapy Documentation Precautions:  Precautions Precautions: Fall Precaution Comments: dense L hemiplegia, incresed extensor tone LLE, left neglect Restrictions Weight Bearing Restrictions: No Pain: Pain Assessment Pain Assessment: No/denies pain Pain Score: 0-No pain Locomotion : Ambulation Ambulation/Gait Assistance: Not tested (comment) Wheelchair Mobility Distance: 150   See FIM for current functional status  Therapy/Group: Individual Therapy  Chipper Herb. Fausto Sampedro, PT, DPT  09/28/2012, 10:05 AM

## 2012-09-28 NOTE — Progress Notes (Signed)
Occupational Therapy Note  Patient Details  Name: Stephanie Mason MRN: [id number] Date of Birth: January 25, 1951 Today's Date: 09/28/2012  Time: 1145-1205(cotx with Speech therapy-total time 1130-1205) Pt denies pain Group Therapy Pt participated in self feeding group with focus on attention to left, engaging LUE in functional tasks, and adhering to swallowing strategies.  Pt required min verbal cues to check for pocketing in left oral cavity.  Pt initiated placing desert cup against LUE to steady dish while using RUE for self feeding task.   Lavone Neri [facility] 09/28/2012, 3:26 PM

## 2012-09-29 ENCOUNTER — Inpatient Hospital Stay (HOSPITAL_COMMUNITY): Payer: BC Managed Care – PPO | Admitting: *Deleted

## 2012-09-29 ENCOUNTER — Inpatient Hospital Stay (HOSPITAL_COMMUNITY): Payer: BC Managed Care – PPO | Admitting: Occupational Therapy

## 2012-09-29 ENCOUNTER — Inpatient Hospital Stay (HOSPITAL_COMMUNITY): Payer: BC Managed Care – PPO | Admitting: Speech Pathology

## 2012-09-29 LAB — CREATININE, SERUM
Creatinine, Ser: 0.68 mg/dL (ref 0.50–1.10)
GFR calc Af Amer: >90 mL/min (ref >90)
GFR calc non Af Amer: >90 mL/min (ref >90)

## 2012-09-29 NOTE — Progress Notes (Signed)
Occupational Therapy Note  Patient Details  Name: Stephanie Mason MRN: [id number] Date of Birth: Apr 24, 1951 Today's Date: 09/29/2012  [employer] [id number] No c/o pain Focus on awareness of left UE placement during meal, mod cues for oral pocketing and slowing her pace down to be able to manage the food in her mouth.    Roney Mans [facility] 09/29/2012, 3:51 PM

## 2012-09-29 NOTE — Progress Notes (Signed)
Occupational Therapy Session Note  Patient Details  Name: Stephanie Mason MRN: [id number] Date of Birth: 09/29/1950  Today's Date: 09/29/2012 Time: 1000-1100 Time Calculation (min): 60 min  Short Term Goals: Week 1:  OT Short Term Goal 1 (Week 1): Pt will maintain static sitting with supervision for 2 mins in preparation for selfcare tasks. OT Short Term Goal 2 (Week 1): Pt will complete bathing sit to stand with mod assist. OT Short Term Goal 3 (Week 1): Pt will locate 3 grooming/bathing items left of midline with no more than mod questioning cues. OT Short Term Goal 4 (Week 1): Pt will donn a pullover shirt with min assist and min instructional cueing to follow hemi techniques. OT Short Term Goal 5 (Week 1): Pt will perform toilet transfer stand pivot with mod assist to 3:1.  Skilled Therapeutic Interventions/Progress Updates:    Pt seen for ADL retraining of B/D at sink level with a focus on static standing at sink. Pt demonstrated improved balance by self correcting with min cues as she was leaning to left side. She does continue to need facilitation/ support through LLE and min to mod assist to support balance in standing.  Worked on postural control in wheelchair with upright posture and forward reaching.  Pt was able to don tshirt with min assist today.  Mod cues to attend to left side. LUE PROM and facilitation with towel slides. No active movement observed.  Therapy Documentation Precautions:  Precautions Precautions: Fall Precaution Comments: dense L hemiplegia, incresed extensor tone LLE, left neglect Restrictions Weight Bearing Restrictions: No     Pain: Pain Assessment Pain Assessment: No/denies pain Pain Score: 0-No pain ADL: See FIM for current functional status  Therapy/Group: Individual Therapy  Stephanie Mason 09/29/2012, 11:42 AM

## 2012-09-29 NOTE — Consult Note (Signed)
09/28/12  NEUROCOGNITIVE TESTING - [facility]   [name] is a 62 year old, right-handed, African-American female, who denied experiencing any major cognitive difficulties. She acknowledged suffering from left-sided motor dysfunction, particularly involving her left hand. According to medical records, she was admitted to the unit owing to functional deficits secondary to a right middle cerebral artery infarct with hemorrhage.   The patient was referred for neuropsychological consultation given the possibility of cognitive sequelae subsequent to the current medical status and in order to assist in treatment planning.   PROCEDURES: [3 units of 29562 on 09/28/12]  Diagnostic Interview Medical record review Behavioral observations  The following tests were performed during today's visit: Repeatable Battery for the Assessment of Neuropsychological Status (RBANS, form A), and the Beck Depression Inventory-Fast Screen. Test results are as follows:   Of note, one task was not administered due to certain physical limitations.   RBANS Indices Scaled Score Percentile Description  Immediate Memory  61 <1 Markedly impaired  Visuospatial/Constructional 53 <1 Markedly impaired  Language 79 8 Borderline impaired  Attention N/A N/A N/A  Delayed Memory 80 9 Borderline impaired  Total Score N/A N/A N/A    RBANS Subtests Raw Score Percentile Description  List Learning 17 1 Markedly impaired  Story Memory 10 2 Markedly impaired  Figure Copy 6 <1 Markedly impaired  Line Orientation 6 <1 Markedly impaired  Picture Naming 9 32 Average  Semantic Fluency 10 1 Markedly impaired  Digit Span 7 7 Borderline impaired  Coding N/A N/A N/A  List Recall 2 3 Markedly impaired  List Recognition 20 70 Average  Story Recall 3 <1 Markedly impaired  Figure recall 3 <1 Markedly impaired    BDI-Fast Screen Raw Score = 4/21 Description = Mild depression    Cognitive  Evaluation: Test results revealed generally reduced (if not markedly impaired) functioning in most cognitive domains and thinking skills assessed during this evaluation with the exception of intact simple naming and verbal recognition.   Emotional & Behavioral Evaluation: [name] was appropriately dressed for season and situation, and she appeared tidy and well-groomed. Normal posture was noted. She was friendly and rapport was easily established. Her speech was mildly dysarthric but she was able to express ideas effectively. She seemed to understand test directions readily. Her affect was flat and she seemed somewhat depressed. Attention and motivation were good. Optimal test taking conditions were maintained.  From an emotional standpoint, on a self-report measure, although [name] did not fill out the form in it's entirely she still endorsed experiencing at least a mild degree of depressive symptoms. Suicidal/homicidal ideation, plan or intent was denied. No manic or hypomanic episodes were reported. The patient denied ever experiencing any auditory/visual hallucinations. No major behavioral or personality changes were endorsed.    Overall, [name] is seemingly suffering from a significant degree of cognitive disruption consistent with the type and placement of the stroke she suffered. Her previous complex medical history may also be contributing to her present medical status. Depression may also be exacerbating her deficits but is not likely the driving force. Functionally, [name] performance meets the criteria for a dementia diagnosis, though it is my hope that will time and treatment that her cognitive and motor skills could improve.   In light of these findings, the following recommendations are provided.    RECOMMENDATIONS  Recommendations for treatment team:    When interacting with [name], directions and information should be provided in a simple, straight forward  manner, and  the treatment team should avoid giving multiple instructions simultaneously.    She may also benefit from being provided with multiple trials to learn new skills given the noted memory inefficiencies. In addition, she will greatly benefit from recognition cueing.    Since emotional factors are adversely impacting the patient's daily life, consider implementing an antidepressant. Overall fluoxetine is a good choice.    To the extent possible, multitasking should be avoided.   [name] requires more time than typical to process information. The treatment team may benefit from waiting for a verbal response to information before presenting additional information.    Be aware that she is suffering from significant visual spatial deficits of which she is reportedly not aware.  Taking this into consideration will help tailor therapy and keep accidents at bay as much as possible when she is trying to navigate her surroundings.    Performance will generally be best in a structured, routine, and familiar environment, as opposed to situations involving complex problems.   Recommendations for discharge planning:    Complete a comprehensive neuropsychological evaluation as an outpatient in 8-12 months to assess for interval change.   Establish long-term follow-up care with a provider knowledgeable in stroke.    Maintain engagement in mentally, physically and cognitively stimulating activities.    Strive to maintain a healthy lifestyle (e.g., proper diet and exercise) in order to promote physical, cognitive and emotional health.    Due to the nature and severity of the symptoms noted during this evaluation, it is recommended that she at least initially obtain constant care and supervision following this hospitalization.    Establishing a power of attorney is warranted.    The patient should refrain from driving at this time.    REFERRING DIAGNOSIS: Stroke  FINAL DIAGNOSES:  Stroke Depressive disorder,  NOS    Debbe Mounts, Psy.D.  Clinical Neuropsychologist

## 2012-09-29 NOTE — Progress Notes (Signed)
PPatient ID: Stephanie Mason, female   DOB: 08-Nov-1950, 62 y.o.   MRN: [id number] Stephanie Mason is a 71 y.o. 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 Admitted 09/15/2012 to [facility] with left-sided weakness. Patient with noted seizure in the ED and received Ativan as well as loaded with Keppra. MRI of the brain showed large acute partially hemorrhagic right hemispheric infarct superimposed upon remote infarct. Patient did receive TPA. Echocardiogram with normal left ventricular function no source of embolus. EEG showed no seizure activity. Carotid Dopplers with no ICA stenosis. TCD with right terminal ICA stenosis. Patient was transferred to [facility] for ongoing evaluation. Placed on aspirin therapy for stroke prophylaxis as well as subcutaneous Lovenox for DVT prophylaxis. TEE completed 09/22/2012 showing no PFO or thrombus. Maintained on a dysphagia 3 thin liquid diet. Chest x-ray with questionable mass that was ruled out with CT of the chest  Subjective/Complaints: Snuff dipper craving nicotine.  Discussed cessation as well as nicoderm patch.  Pt's son brought her snuff.  Has snuff stains on shirt Intermittent spasms of LUE and LLE improved on Zanaflex, no drowsiness  Objective: Vital Signs: Blood pressure 114/61, pulse 75, temperature 98.3 F (36.8 C), temperature source Tympanic, resp. rate 17, height 5\' 7"  (1.702 m), weight 71.6 kg (157 lb 13.6 oz), SpO2 97.00%. No results found. No results found for this or any previous visit (from the past 72 hour(s)).   HEENT: Left lower facial droop Cardio: RRR and no extra sounds Resp: CTA B/L and unlabored GI: BS positive and non tender Extremity:  Pulses positive and No Edema Skin:   Intact and Other no pressure areas Neuro: Lethargic, Flat, Cranial Nerve Abnormalities L central VII, Abnormal Sensory Absent to LT, Proprio, Abnormal Motor 0/5 in LUE and LLE, Abnormal FMC  Ataxic/ dec FMC, Reflexes: 0, Tone:  Hypotonia, Dysarthric and Inattention Musc/Skel:  Other no pain with Left shoulder ROM Gen NAD   Assessment/Plan: 1. Functional deficits secondary to Right MCA infarct with hemorrhage which require 3+ hours per day of interdisciplinary therapy in a comprehensive inpatient rehab setting. Physiatrist is providing close team supervision and 24 hour management of active medical problems listed below. Physiatrist and rehab team continue to assess barriers to discharge/monitor patient progress toward functional and medical goals. FIM: FIM - Bathing Bathing Steps Patient Completed: Chest;Abdomen;Front perineal area;Right upper leg;Left upper leg;Right lower leg (including foot);Left lower leg (including foot);Left Arm (long handled sponge) Bathing: 4: Min-Patient completes 8-9 72f 10 parts or 75+ percent  FIM - Upper Body Dressing/Undressing Upper body dressing/undressing steps patient completed: Put head through opening of pull over shirt/dress;Thread/unthread right sleeve of pullover shirt/dresss Upper body dressing/undressing: 3: Mod-Patient completed 50-74% of tasks FIM - Lower Body Dressing/Undressing Lower body dressing/undressing steps patient completed: Thread/unthread right pants leg Lower body dressing/undressing: 1: Total-Patient completed less than 25% of tasks  FIM - Toileting Toileting steps completed by patient: Performs perineal hygiene Toileting Assistive Devices: Grab bar or rail for support Toileting: 2: Max-Patient completed 1 of 3 steps  FIM - Diplomatic Services operational officer Devices: Grab bars Toilet Transfers: 3-To toilet/BSC: Mod A (lift or lower assist);3-From toilet/BSC: Mod A (lift or lower assist)  FIM - Bed/Chair Transfer Bed/Chair Transfer Assistive Devices: Bed rails;HOB elevated;Arm rests Bed/Chair Transfer: 2: Chair or W/C > Bed: Max A (lift and lower assist);2: Supine > Sit: Max A (lifting assist/Pt.  25-49%)  FIM - Locomotion: Wheelchair Distance: 125 Locomotion: Wheelchair: 2: [employer]  50 - 149 ft with supervision, cueing or coaxing FIM - Locomotion: Ambulation Locomotion: Ambulation Assistive Devices: Other (comment) (R handrail) Ambulation/Gait Assistance: 1: +2 Total assist Locomotion: Ambulation: 1: Two helpers  Comprehension Comprehension Mode: Auditory Comprehension: 4-Understands basic 75 - 89% of the time/requires cueing 10 - 24% of the time  Expression Expression Mode: Verbal Expression: 4-Expresses basic 75 - 89% of the time/requires cueing 10 - 24% of the time. Needs helper to occlude trach/needs to repeat words.  Social Interaction Social Interaction Mode: Asleep Social Interaction: 4-Interacts appropriately 75 - 89% of the time - Needs redirection for appropriate language or to initiate interaction.  Problem Solving Problem Solving Mode: Asleep Problem Solving: 3-Solves basic 50 - 74% of the time/requires cueing 25 - 49% of the time  Memory Memory Mode: Asleep Memory: 4-Recognizes or recalls 75 - 89% of the time/requires cueing 10 - 24% of the time  Medical Problem List and Plan:  1. Partially hemorrhagic right MCA infarct superimposed upon remote right brain infarct with dense left hemiparesthesias and visual spatial deficits , developing intermittent painful spasms will order zanaflex 2. DVT Prophylaxis/Anticoagulation: Subcutaneous Lovenox. Monitor platelet counts and any signs of bleeding  3. Pain Management: Ultram as needed. Monitor with increased mobility  4. Neuropsych: This patient is not capable of making decisions on his/her own behalf.  5. Seizure disorder. Keppra 500 mg every 12 hours. EEG negative  6. Hypertension. Avapro 150 mg daily. Monitor with increased activity 7.  Dysphagia, pocketing soft textures and staff supervision 8.  Macrocytosis with borderline low RBCs MMA nl, RBC folate nl, admits to 1 beer a day at home-monitor LOS (Days) 7 A  FACE TO FACE EVALUATION WAS PERFORMED  Nikholas Geffre E 09/29/2012, 7:48 AM

## 2012-09-29 NOTE — Progress Notes (Signed)
Physical Therapy Session Note  Patient Details  Name: Stephanie Mason MRN: [id number] Date of Birth: 02-04-1951  Today's Date: 09/29/2012 Time: 0102-7253 Time Calculation (min): 59 min  Short Term Goals: Week 1:  PT Short Term Goal 1 (Week 1): Patient will perform squat pivot transfers with mod assist. PT Short Term Goal 2 (Week 1): Patient will perform bed mobility with mod assist. PT Short Term Goal 3 (Week 1): Patient will propel 28' in wheelchair using hemi technique with mod assist. PT Short Term Goal 4 (Week 1): Patient will ambulate 31' with LRAD and max assist.  Skilled Therapeutic Interventions/Progress Updates:    Patient received supine in bed. Today's session focused on wheelchair mobility, dynamic sitting balance with emphasis on reaching for weight objects, weight shifts, and crossing midline, and bed mobility on flat surface.  Patient demonstrating increased extension and posterior leans with squat pivot transfers today. Patient performed dynamic sitting balance activity, reaching for 2 weighted balls. Patient required to reach across midline to balls placed on mat to her L and place them on table in front and to the R or in front and to the L. Patient also required to reach for balls on the floor to her R and on the floor to her L and place them on table directly in front of her or directly in front and to the L. Activity focused on attention/scanning to the L, weight shifting to the L, anterior weight shifts. Patient also performed lateral leans/weight shifting to the L with emphasis on postural control of trunk to return to midline. When patient has LOB laterally to the L, she is able to use R UE to cross midline and provide support to prevent total LOB. Patient requires stand by assistance-mod assist overall for dynamic sitting balance activities for LOB posteriorly and laterally to the L.  Patient performed x20 reps of bridges with stabilization provided for L LE in order to  increase weight bearing and strengthening. Patient instructed in proper sequencing and technique for bed mobility: rolling L and rolling R with emphasis on protecting L UE and using L UE to assist with momentum, especially with rolling R.  Patient returned to room and left seated in wheelchair with seatbelt donned and all needs within reach.  Therapy Documentation Precautions:  Precautions Precautions: Fall Precaution Comments: dense L hemiplegia, incresed extensor tone LLE, left neglect Restrictions Weight Bearing Restrictions: No Pain: Pain Assessment Pain Assessment: No/denies pain Pain Score: 0-No pain Locomotion : Wheelchair Mobility Wheelchair Mobility: Yes Wheelchair Assistance: 4: Min Chiropodist Details: Verbal cues for precautions/safety;Verbal cues for sequencing;Verbal cues for Diplomatic Services operational officer: Right upper extremity;Right lower extremity Wheelchair Parts Management: Needs assistance Distance: 155  Balance: Dynamic Sitting Balance Dynamic Sitting - Balance Support: Feet supported;No upper extremity supported Dynamic Sitting - Level of Assistance: 3: Mod assist;5: Stand by assistance;4: Min assist Dynamic Sitting Balance - Compensations: When patient performs lateral trunk leans to L side, when she experiences LOB to the L, she is able to bring R UE across body to assist in pushing trunk back up to midline. Dynamic Sitting - Balance Activities: Lateral lean/weight shifting;Forward lean/weight shifting;Reaching for objects;Reaching for weighted objects;Reaching across midline;Trunk control activities  See FIM for current functional status  Therapy/Group: Individual Therapy  Chipper Herb. Shanele Nissan, PT, DPT  09/29/2012, 10:29 AM

## 2012-09-29 NOTE — Progress Notes (Signed)
Speech Language Pathology Daily Session Note  Patient Details  Name: Stephanie Mason MRN: [id number] Date of Birth: [birth year]  Today's Date: 09/29/2012 Time: 1130-1145 Time Calculation (min): 15 min  Short Term Goals: Week 1: SLP Short Term Goal 1 (Week 1): Patient will consume Dys.3 textures and thin liquids with supervision vebral cues to utilize safe swallow compensatory strategies. SLP Short Term Goal 2 (Week 1): Patient will attend to basic task for 2-3 minutes with mod assit verbal cues for re-direction. SLP Short Term Goal 3 (Week 1): Patient will request help as needed with various tasks with mod assist question cues. SLP Short Term Goal 4 (Week 1): Patient will demonstrate basic problem solving during functional tasks with mod assist verbal, visual and tactile cues. SLP Short Term Goal 5 (Week 1): Patient will utilize speech intelligiblty strategies duirng verbal epression with min assist verbal and non-verbal cues.  Skilled Therapeutic Interventions: Group, co-treatment session with OT focused on addressing dysphagia goals and self-feeding. Patient consumed Dys.3 textures and thin liquids with set up assist. Pt attended to left buccal pocketing with Mod A verbal cues and also required Min visual and verbal cues to attend to left anterior spillage. Patient exhibited no overt s/s of aspiration.    FIM:  Comprehension Comprehension Mode: Auditory Comprehension: 4-Understands basic 75 - 89% of the time/requires cueing 10 - 24% of the time Expression Expression Mode: Verbal Expression: 4-Expresses basic 75 - 89% of the time/requires cueing 10 - 24% of the time. Needs helper to occlude trach/needs to repeat words. Social Interaction Social Interaction: 4-Interacts appropriately 75 - 89% of the time - Needs redirection for appropriate language or to initiate interaction. Problem Solving Problem Solving: 3-Solves basic 50 - 74% of the time/requires cueing 25 - 49% of the  time Memory Memory: 4-Recognizes or recalls 75 - 89% of the time/requires cueing 10 - 24% of the time FIM - Eating Eating Activity: 5: Supervision/cues;5: Set-up assist for open containers;4: Helper checks for pocketed food  Pain Pain Assessment Pain Assessment: No/denies pain  Therapy/Group: Group Therapy  [name] 09/29/2012, 4:00 PM

## 2012-09-29 NOTE — Progress Notes (Addendum)
Speech Language Pathology Daily Session Note  Patient Details  Name: Stephanie Mason MRN: [id number] Date of Birth: September 20, 1950  Today's Date: 09/29/2012 Time: 1350-1435 Time Calculation (min): 45 min  Short Term Goals: Week 1: SLP Short Term Goal 1 (Week 1): Patient will consume Dys.3 textures and thin liquids with supervision vebral cues to utilize safe swallow compensatory strategies. SLP Short Term Goal 2 (Week 1): Patient will attend to basic task for 2-3 minutes with mod assit verbal cues for re-direction. SLP Short Term Goal 3 (Week 1): Patient will request help as needed with various tasks with mod assist question cues. SLP Short Term Goal 4 (Week 1): Patient will demonstrate basic problem solving during functional tasks with mod assist verbal, visual and tactile cues. SLP Short Term Goal 5 (Week 1): Patient will utilize speech intelligiblty strategies duirng verbal epression with min assist verbal and non-verbal cues.  Skilled Therapeutic Interventions:  Skilled treatment session focused on addressing dysphagia and dysarthria goals.  SLP facilitated session with 4a placement of NMES to address left-sided labial weakness.  SLP set NMES to 8.59mU while patient perform oral motor exercises with mirror and supervision level verbal clinician cues with improved range of motion and ability to get complete labial seal, which is a functional improvement compared to last week.  SLP also facilitated session with ice chip trials, which elicited anterior loss of thin liquids in 10% of opportunities; patient was able to self-monitor and correct with mirror in place and increased wait time.     FIM:  Comprehension Comprehension Mode: Auditory Comprehension: 4-Understands basic 75 - 89% of the time/requires cueing 10 - 24% of the time Expression Expression Mode: Verbal Expression: 4-Expresses basic 75 - 89% of the time/requires cueing 10 - 24% of the time. Needs helper to occlude trach/needs to  repeat words. Social Interaction Social Interaction: 4-Interacts appropriately 75 - 89% of the time - Needs redirection for appropriate language or to initiate interaction. Problem Solving Problem Solving: 3-Solves basic 50 - 74% of the time/requires cueing 25 - 49% of the time Memory Memory: 4-Recognizes or recalls 75 - 89% of the time/requires cueing 10 - 24% of the time FIM - Eating Eating Activity: 5: Supervision/cues;5: Set-up assist for open containers;4: Helper checks for pocketed food  Pain Pain Assessment Pain Assessment: No/denies pain  Therapy/Group: Individual Therapy  Charlane Ferretti., CCC-SLP [phone]  [name] 09/29/2012, 4:32 PM

## 2012-09-30 ENCOUNTER — Inpatient Hospital Stay (HOSPITAL_COMMUNITY): Payer: BC Managed Care – PPO | Admitting: Occupational Therapy

## 2012-09-30 ENCOUNTER — Inpatient Hospital Stay (HOSPITAL_COMMUNITY): Payer: BC Managed Care – PPO | Admitting: *Deleted

## 2012-09-30 ENCOUNTER — Inpatient Hospital Stay (HOSPITAL_COMMUNITY): Payer: BC Managed Care – PPO | Admitting: Speech Pathology

## 2012-09-30 MED ORDER — METHYLPHENIDATE HCL 5 MG PO TABS
5.0000 mg | ORAL_TABLET | Freq: Two times a day (BID) | ORAL | Status: DC
  Administered 2012-09-30 – 2012-10-08 (×16): 5 mg via ORAL
  Filled 2012-09-30 (×16): qty 1

## 2012-09-30 NOTE — Progress Notes (Signed)
Occupational Therapy Note  Patient Details  Name: Kathlyne Loud MRN: [id number] Date of Birth: [birth year] Today's Date: 09/30/2012  Time: 1145-1210(cotx with Speech Therapy-total time 1130-1210) Pt denies pain Group Therapy Pt participated in self feeding group with focus on incorporating LUE in assisting with self feeding tasks, attention to left, and performing tongue sweep to clear oral cavity.  Pt required mod verbal cues to clear oral cavity and attend to left.     Lavone Neri [facility] 09/30/2012, 3:41 PM

## 2012-09-30 NOTE — Progress Notes (Signed)
Nutrition Brief Note  Patient identified on the Malnutrition Screening Tool (MST) Report. Per chart, pt with adequate intake since admission to rehab. Additionally wt appears to be stable. No skin breakdown noted.  Body mass index is 24.72 kg/(m^2). Patient meets criteria for normal weight based on current BMI.   Current diet order is Dysphagia 3, patient is consuming approximately 50-75% of meals at this time. Labs and medications reviewed.   No nutrition interventions warranted at this time. If nutrition issues arise, please consult RD.   Jarold Motto MS, RD, LDN Pager: [phone] After-hours pager: [phone]

## 2012-09-30 NOTE — Patient Care Conference (Signed)
Inpatient RehabilitationTeam Conference and Plan of Care Update Date: 09/30/2012   Time: 10:55 Am    Patient Name: Stephanie Mason      Medical Record Number: [id number]  Date of Birth: 1950-12-03 Sex: Female         Room/Bed: 4034/4034-01 Payor Info: Payor: BLUE CROSS BLUE SHIELD  Plan: BCBS PPO OUT OF STATE  Product Type: *No Product type*     Admitting Diagnosis: rt cva  Admit Date/Time:  09/22/2012  4:07 PM Admission Comments: No comment available   Primary Diagnosis:  CVA (cerebral infarction) Principal Problem: CVA (cerebral infarction)  Patient Active Problem List   Diagnosis Date Noted  . CVA (cerebral infarction) 09/22/2012  . 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 Hemiplegia, unspecified, affecting nondominant side 09/15/2012  . Cerebral embolism with cerebral infarction 09/15/2012  . Other convulsions 09/15/2012    Expected Discharge Date: Expected Discharge Date: 10/20/12  Team Members Present: Physician leading conference: Dr. Claudette Laws Social Worker Present: Dossie Der, LCSW Nurse Present: Other (comment) Milly Jakob Rcom-RN) PT Present: Edman Circle, PT;Other (comment) (bridget Ripa-PT) OT Present: Leonette Monarch, Felipa Eth, OT SLP Present: [name], SLP Other (Discipline and Name): marie Noel-PPS Coordiantor     Current Status/Progress Goal Weekly Team Focus  Medical   R MCA dense field cut  and motor deficits  improve attention, initiation  ritalin trial   Bowel/Bladder   pt incontinent at tiimes of B/B wears brief.   will be continent with mod assist  time toileting   Swallow/Nutrition/ Hydration   Dys.3 textures and thin liquids with full supervision   least restrictive p.o. intake   increase carryover of strategies for pocketing; NMES   ADL's   mod assist bathing, UB dressing; total LB dressing and toileting; max assist with transfers to toilet  min assist with bathing, LB dressing, toileting, toilet transfers, tub bench transfers;  supervision UB dressing,  postural control, sitting balance, sit to stand, left side awareness, sit to stand. ADL retraining, pt/family education   Mobility   max A all functional mobility  S-min A overall  bed mobility, transfers, gait training with R handrail, postural control in sitting and standing, NMR L LE, sitting/standing balance, w/c mobility   Communication   min assist  supervision  carryover of speech intelligibility strategies   Safety/Cognition/ Behavioral Observations  mod assist   min assist  increase left attention, self monitoring and correcting   Pain   c/o occasional left shoulder/arm pain. Has prn tylenol 325-650mg  po q 4hrs and ultram 100mg  po q 6hrs   3 or less  will assess q shift for pain and medicate as needed.    Skin   skin clean dry intact.  will remain intact free of breakdown/infection.  will remain free of skin breakdown/infection.       *See Interdisciplinary Assessment and Plan and progress notes for long and short-term goals  Barriers to Discharge: ?24/7 care available post D/C    Possible Resolutions to Barriers:  SW to explore    Discharge Planning/Teaching Needs:  Home with boyfriend there in the evenings and daughter to provide care during the day while boyfriend works      Team Discussion:  Consulting civil engineer for improved continency.  L-shoulder pain-meds help.  Dense field cut and weakness.  Tone beginning in hamstrings. Flat affect.  Confirming discharge plan and 24 hr care  Revisions to Treatment Plan:  None   Continued Need for Acute Rehabilitation Level of Care: The patient requires daily  medical management by a physician with specialized training in physical medicine and rehabilitation for the following conditions: Daily direction of a multidisciplinary physical rehabilitation program to ensure safe treatment while eliciting the highest outcome that is of practical value to the patient.: Yes Daily medical management of patient stability  for increased activity during participation in an intensive rehabilitation regime.: Yes Daily analysis of laboratory values and/or radiology reports with any subsequent need for medication adjustment of medical intervention for : Neurological problems  Kyiesha Millward, Lemar Livings 10/01/2012, 8:58 AM

## 2012-09-30 NOTE — Progress Notes (Signed)
Physical Therapy Weekly Progress Note  Patient Details  Name: Stephanie Mason MRN: [id number] Date of Birth: 05-08-1951  Today's Date: 09/30/2012 Time: 0830-0930 Time Calculation (min): 60 min  Patient has met 2 of 4 short term goals. Patient has not met mod assist level with bed mobility or squat pivot transfers, but is progressing towards these goals. Continue to address these STGs this week.  Patient continues to demonstrate the following deficits: Left inattention, decreased awareness, decreased strength, impaired sitting and standing balance, poor postural control in sitting and standing, decreased activity tolerance, decreased coordination and therefore will continue to benefit from skilled PT intervention to enhance overall performance with activity tolerance, balance, postural control, ability to compensate for deficits, functional use of  left upper extremity and left lower extremity, attention, awareness and coordination.  Patient progressing toward long term goals..  Continue plan of care.  PT Short Term Goals Week 1:  PT Short Term Goal 1 (Week 1): Patient will perform squat pivot transfers with mod assist. PT Short Term Goal 1 - Progress (Week 1): Progressing toward goal PT Short Term Goal 2 (Week 1): Patient will perform bed mobility with mod assist. PT Short Term Goal 2 - Progress (Week 1): Progressing toward goal PT Short Term Goal 3 (Week 1): Patient will propel 20' in wheelchair using hemi technique with mod assist. PT Short Term Goal 3 - Progress (Week 1): Met PT Short Term Goal 4 (Week 1): Patient will ambulate 32' with LRAD and max assist. PT Short Term Goal 4 - Progress (Week 1): Met Week 2:  PT Short Term Goal 1 (Week 2): Patient will perform squat pivot transfers with mod assist. PT Short Term Goal 2 (Week 2): Patient will perform bed mobility with mod assist. PT Short Term Goal 3 (Week 2): Patient will propel wheelchair 150' with min assist. PT Short Term Goal 4  (Week 2): Patient will perform dynamic sitting activities outside BOS with supervision. PT Short Term Goal 5 (Week 2): Patient will ambulate 81' with LRAD and mod assist.  Skilled Therapeutic Interventions/Progress Updates:    Patient received supine in bed. Today's session focused on wheelchair mobility, NMR for trunk and core musculature and L LE. Patient assisted in donning of pants, socks, and shoes in supine and sweatshirt when seated edge of bed. Patient continues to require verbal cues and manual facilitation for anterior weight shift secondary to posterior lean during squat pivot transfers.  Patient positioned sitting edge of mat with B feet supported on floor and big wedge with pillow positioned posterior to patient. Patient instructed to slowly lower/exntend trunk posteriorly to reclined position on wedge. Once reclined on wedge, patient instructed to elevate trunk, perform anterior weight shift to simulate a modified sit up to facilitate increased strengthening and control of trunk. Initially, patient utilizes PT's hand to assist with slow, controlled trunk extension, but is able to progress to maintaining R UE across her chest to perform slow, controlled eccentric trunk extension. Patient only able to perform one "sit up" without R UE assist, then requires either pushing or pulling up with R UE. Patient performed 3 sets x8-10 reps.  Patient performed repeated sit<>stand from elevated mat, requiring mod-max assist secondary to L lateral lean. Patient benefits from verbal and tactile cues to lean anterior and to the R or to "bring her nose over her R knee" to assist with appropriate weight shift. Patient requires stabilization of L knee to prevent buckling and manual facilitation for midline orientation throughout transfer. Patient  requires mod assist when she appropriate shifts weight anteriorly and to the R prior to standing to decrease pushing to the L with R UE and R LE. Patient requires max  assist when exhibiting L lateral lean. Patient performed 1 set x 8 reps and 1 set x5 reps.  Patient returned to room and left seated in wheelchair with seatbelt donned and all needs within reach.  Therapy Documentation Precautions:  Precautions Precautions: Fall Precaution Comments: dense L hemiplegia, incresed extensor tone LLE, left neglect Restrictions Weight Bearing Restrictions: No Pain: Pain Assessment Pain Assessment: No/denies pain Pain Score: 0-No pain Locomotion : Ambulation Ambulation/Gait Assistance: Not tested (comment) Wheelchair Mobility Distance: 155   See FIM for current functional status  Therapy/Group: Individual Therapy  Chipper Herb. Christabel Camire, PT, DPT  09/30/2012, 10:38 AM

## 2012-09-30 NOTE — Progress Notes (Signed)
Speech Language Pathology Daily Session Notes  Patient Details  Name: Stephanie Mason MRN: [id number] Date of Birth: 24-Feb-1951  Today's Date: 09/30/2012  Session 1 Time: 1130-1145  Time Calculation: 15 min  Session 2 Time: 3086-5784 Time Calculation (min): 40 min  Short Term Goals: Week 2: SLP Short Term Goal 1 (Week 2): Patient will consume Dys.3 textures and thin liquids with supervision vebral cues to utilize safe swallow compensatory strategies. SLP Short Term Goal 2 (Week 2): Patient will attend to left upper extremity and environment with mod assist verbal cues. SLP Short Term Goal 3 (Week 2): Patient will request help as needed with various tasks with mod assist question cues. SLP Short Term Goal 4 (Week 2): Patient will utilize speech intelligibility strategies during verbal epression with min assist verbal and non-verbal cues.  Skilled Therapeutic Interventions:  Session 1: Group, co-treatment session with OT focused on addressing dysphagia goals and self-feeding. Patient consumed Dys.3 textures and thin liquids with set up assist. Pt attended to left buccal pocketing and left anterior spillage with Min A visual cues. Patient without overt s/s of aspiration.   Session 2: Skilled treatment session focused on addressing dysphagia and dysarthria goals. SLP facilitated session with 4a placement of NMES to address left-sided labial weakness. SLP set NMES to 8.42mU while patient perform oral motor exercises with mirror and supervision level verbal clinician cues with improved range of motion and ability for complete labial seal. Pt demonstrated appropriate anticipatory awareness during functional conversation and reported she may need to receive more therapy at a SNF to improve her overall functional independence for eventual discharge home.    FIM:  Comprehension Comprehension Mode: Auditory Comprehension: 5-Follows basic conversation/direction: With extra time/assistive  device Expression Expression Mode: Verbal Expression: 4-Expresses basic 75 - 89% of the time/requires cueing 10 - 24% of the time. Needs helper to occlude trach/needs to repeat words. Social Interaction Social Interaction: 4-Interacts appropriately 75 - 89% of the time - Needs redirection for appropriate language or to initiate interaction. Problem Solving Problem Solving: 3-Solves basic 50 - 74% of the time/requires cueing 25 - 49% of the time Memory Memory: 4-Recognizes or recalls 75 - 89% of the time/requires cueing 10 - 24% of the time FIM - Eating Eating Activity: 4: Helper checks for pocketed food;5: Supervision/cues;5: Set-up assist for open containers  Pain Pain Assessment Pain Assessment: No/denies pain  Therapy/Group: Individual Therapy and Group Therapy  Jarion Hawthorne 09/30/2012, 4:38 PM

## 2012-09-30 NOTE — Progress Notes (Signed)
Social Work Patient ID: Stephanie Mason, female   DOB: 1951/04/23, 62 y.o.   MRN: [id number] Met with pt and boyfriend-[name] to discuss team conference progression toward goals-min level and discharge 3/11.  Daughter works so Is not able to provide care to pt, also george works at the same facility pt [employer].  Discussed option of going to Nursing Facility where She and [name] work to continue her rehab then return home, once does not need 24 hour care.  Will discuss with her daughter also.  Come up with A safe discharge plan.

## 2012-09-30 NOTE — Progress Notes (Signed)
Speech Language Pathology Weekly Progress Note  Patient Details  Name: Stephanie Mason MRN: [id number] Date of Birth: 1951-05-08  Today's Date: 09/30/2012  Short Term Goals: Week 1: SLP Short Term Goal 1 (Week 1): Patient will consume Dys.3 textures and thin liquids with supervision vebral cues to utilize safe swallow compensatory strategies. SLP Short Term Goal 1 - Progress (Week 1): Progressing toward goal SLP Short Term Goal 2 (Week 1): Patient will attend to basic task for 2-3 minutes with mod assit verbal cues for re-direction. SLP Short Term Goal 2 - Progress (Week 1): Met SLP Short Term Goal 3 (Week 1): Patient will request help as needed with various tasks with mod assist question cues. SLP Short Term Goal 3 - Progress (Week 1): Progressing toward goal SLP Short Term Goal 4 (Week 1): Patient will demonstrate basic problem solving during functional tasks with mod assist verbal, visual and tactile cues. SLP Short Term Goal 4 - Progress (Week 1): Progressing toward goal SLP Short Term Goal 5 (Week 1): Patient will utilize speech intelligiblty strategies duirng verbal epression with min assist verbal and non-verbal cues. SLP Short Term Goal 5 - Progress (Week 1): Progressing toward goal Week 2: SLP Short Term Goal 1 (Week 2): Patient will consume Dys.3 textures and thin liquids with supervision vebral cues to utilize safe swallow compensatory strategies. SLP Short Term Goal 2 (Week 2): Patient will attend to left upper extremity and environment with mod assist verbal cues. SLP Short Term Goal 3 (Week 2): Patient will request help as needed with various tasks with mod assist question cues. SLP Short Term Goal 4 (Week 2): Patient will utilize speech intelligibility strategies during verbal epression with min assist verbal and non-verbal cues.  Weekly Progress Updates: Patient met 1 out of 5 short term goals this reporting period due to gains in sustained attention.  Patient also made gains  in swallow function, requests for help, awareness, left attention problem solving and speech intelligibility; however, due to severity of cognitive deficits it is difficult for this patient to consistently perform these tasks with mod assist verbal cues.  Therefore, these goals are being carried over into the next week and SLP will continue to address cognition and self-care tasks with increased awareness, self-monitoring and correcting during the next reporting period.  As a result, this patient continues to require skilled SLP services to maximize functional independence to reduce burden of care prior to discharge.      SLP Intensity: Minumum of 1-2 x/day, 30 to 90 minutes SLP Frequency: 5 out of 7 days SLP Duration/Estimated Length of Stay: 2-3 weeks SLP Treatment/Interventions: Cognitive remediation/compensation;Cueing hierarchy;Dysphagia/aspiration precaution training;Environmental controls;Functional tasks;Internal/external aids;Patient/family education;Speech/Language facilitation;Therapeutic Activities;Therapeutic Exercise  Charlane Ferretti., CCC-SLP [phone]  Stephanie Mason 09/30/2012, 12:20 PM

## 2012-09-30 NOTE — Progress Notes (Signed)
Occupational Therapy Session Note  Patient Details  Name: Stephanie Mason MRN: [id number] Date of Birth: 05-04-1951  Today's Date: 09/30/2012 Time: 6295-2841 Time Calculation (min): 61 min  Short Term Goals: Week 1:  OT Short Term Goal 1 (Week 1): Pt will maintain static sitting with supervision for 2 mins in preparation for selfcare tasks. OT Short Term Goal 2 (Week 1): Pt will complete bathing sit to stand with mod assist. OT Short Term Goal 3 (Week 1): Pt will locate 3 grooming/bathing items left of midline with no more than mod questioning cues. OT Short Term Goal 4 (Week 1): Pt will donn a pullover shirt with min assist and min instructional cueing to follow hemi techniques. OT Short Term Goal 5 (Week 1): Pt will perform toilet transfer stand pivot with mod assist to 3:1.  Skilled Therapeutic Interventions/Progress Updates:    Pt seen for bathing and dressing sink level per her request.  Focus on maintain static sitting balance edge of chair while working on bathing task.  Pt needing mod assist for dynamic sitting balance during session.  She needs total assist to integrate the LUE into any functional task related to grooming or bathing.  Mod assist for sit to stand with max assist needed to maintain standing balance while performing peri care or pulling pants over her hips.  She was able to locate the washcloth and soap placed on the left side of the sink with min instructional cueing.  Still needs max assist for crossing the LLE over the right during bathing and dressing.  Max instructional cueing for hemi dressing techniques to donn pullover shirt or pants.   Therapy Documentation Precautions:  Precautions Precautions: Fall Precaution Comments: dense L hemiplegia, incresed extensor tone LLE, left neglect Restrictions Weight Bearing Restrictions: No  Pain: Pain Assessment Pain Assessment: No/denies pain Pain Score: 0-No pain ADL: See FIM for current functional  status  Therapy/Group: Individual Therapy  Horatio Bertz OTR/L 09/30/2012, 10:39 AM

## 2012-09-30 NOTE — Progress Notes (Signed)
PPatient ID: Stephanie Mason, female   DOB: April 13, 1951, 62 y.o.   MRN: [id number] Stephanie Mason is a 62 y.o. 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 Admitted 09/15/2012 to [facility] with left-sided weakness. Patient with noted seizure in the ED and received Ativan as well as loaded with Keppra. MRI of the brain showed large acute partially hemorrhagic right hemispheric infarct superimposed upon remote infarct. Patient did receive TPA. Echocardiogram with normal left ventricular function no source of embolus. EEG showed no seizure activity. Carotid Dopplers with no ICA stenosis. TCD with right terminal ICA stenosis. Patient was transferred to [facility] for ongoing evaluation. Placed on aspirin therapy for stroke prophylaxis as well as subcutaneous Lovenox for DVT prophylaxis. TEE completed 09/22/2012 showing no PFO or thrombus. Maintained on a dysphagia 3 thin liquid diet. Chest x-ray with questionable mass that was ruled out with CT of the chest  Subjective/Complaints: No new issues Intermittent spasms of LUE and LLE improved on Zanaflex, no drowsiness  Objective: Vital Signs: Blood pressure 97/70, pulse 73, temperature 97.5 F (36.4 C), temperature source Oral, resp. rate 20, height 5\' 7"  (1.702 m), weight 71.6 kg (157 lb 13.6 oz), SpO2 95.00%. No results found. Results for orders placed during the hospital encounter of 09/22/12 (from the past 72 hour(s))  CREATININE, SERUM     Status: None   Collection Time    09/29/12  7:35 AM      Result Value Range   Creatinine, Ser 0.68  0.50 - 1.10 mg/dL   GFR calc non Af Amer >90  >90 mL/min   GFR calc Af Amer >90  >90 mL/min   Comment:            The eGFR has been calculated     using the CKD EPI equation.     This calculation has not been     validated in all clinical     situations.     eGFR's persistently     <90 mL/min signify     possible Chronic Kidney Disease.      HEENT: Left lower facial droop Cardio: RRR and no extra sounds Resp: CTA B/L and unlabored GI: BS positive and non tender Extremity:  Pulses positive and No Edema Skin:   Intact and Other no pressure areas Neuro: Lethargic, Flat, Cranial Nerve Abnormalities L central VII, Abnormal Sensory Absent to LT, Proprio, Abnormal Motor 0/5 in LUE and LLE, Abnormal FMC Ataxic/ dec FMC, Reflexes: 0, Tone:  Hypotonia, Dysarthric and Inattention Musc/Skel:  Other no pain with Left shoulder ROM Gen NAD   Assessment/Plan: 1. Functional deficits secondary to Right MCA infarct with hemorrhage which require 3+ hours per day of interdisciplinary therapy in a comprehensive inpatient rehab setting. Physiatrist is providing close team supervision and 24 hour management of active medical problems listed below. Physiatrist and rehab team continue to assess barriers to discharge/monitor patient progress toward functional and medical goals. FIM: FIM - Bathing Bathing Steps Patient Completed: Chest;Abdomen;Front perineal area;Right upper leg;Left upper leg;Right lower leg (including foot);Left lower leg (including foot);Left Arm (long handled sponge) Bathing: 4: Min-Patient completes 8-9 44f 10 parts or 75+ percent  FIM - Upper Body Dressing/Undressing Upper body dressing/undressing steps patient completed: Thread/unthread right sleeve of pullover shirt/dresss;Put head through opening of pull over shirt/dress;Pull shirt over trunk Upper body dressing/undressing: 4: Min-Patient completed 75 plus % of tasks FIM - Lower Body Dressing/Undressing Lower body dressing/undressing steps patient completed: Thread/unthread right  pants leg Lower body dressing/undressing: 2: Max-Patient completed 25-49% of tasks  FIM - Toileting Toileting steps completed by patient: Performs perineal hygiene Toileting Assistive Devices: Grab bar or rail for support Toileting: 2: Max-Patient completed 1 of 3 steps  FIM - Ambulance person Devices: Grab bars Toilet Transfers: 3-To toilet/BSC: Mod A (lift or lower assist);3-From toilet/BSC: Mod A (lift or lower assist)  FIM - Bed/Chair Transfer Bed/Chair Transfer Assistive Devices: Bed rails;HOB elevated;Arm rests Bed/Chair Transfer: 2: Supine > Sit: Max A (lifting assist/Pt. 25-49%);2: Sit > Supine: Max A (lifting assist/Pt. 25-49%);2: Bed > Chair or W/C: Max A (lift and lower assist);2: Chair or W/C > Bed: Max A (lift and lower assist)  FIM - Locomotion: Wheelchair Distance: 155 Locomotion: Wheelchair: 4: Travels 150 ft or more: maneuvers on rugs and over door sillls with minimal assistance (Pt.>75%) FIM - Locomotion: Ambulation Locomotion: Ambulation Assistive Devices: Other (comment) (R handrail) Ambulation/Gait Assistance: Not tested (comment) Locomotion: Ambulation: 0: Activity did not occur  Comprehension Comprehension Mode: Auditory Comprehension: 5-Understands complex 90% of the time/Cues < 10% of the time  Expression Expression Mode: Verbal Expression: 4-Expresses basic 75 - 89% of the time/requires cueing 10 - 24% of the time. Needs helper to occlude trach/needs to repeat words.  Social Interaction Social Interaction Mode: Asleep Social Interaction: 4-Interacts appropriately 75 - 89% of the time - Needs redirection for appropriate language or to initiate interaction.  Problem Solving Problem Solving Mode: Asleep Problem Solving: 3-Solves basic 50 - 74% of the time/requires cueing 25 - 49% of the time  Memory Memory Mode: Asleep Memory: 5-Recognizes or recalls 90% of the time/requires cueing < 10% of the time  Medical Problem List and Plan:  1. Partially hemorrhagic right MCA infarct superimposed upon remote right brain infarct with dense left hemiparesthesias and visual spatial deficits , developing intermittent painful spasms  zanaflex 2. DVT Prophylaxis/Anticoagulation: Subcutaneous Lovenox. Monitor platelet counts  and any signs of bleeding  3. Pain Management: Ultram as needed. Monitor with increased mobility  4. Neuropsych: This patient is not capable of making decisions on his/her own behalf.  5. Seizure disorder. Keppra 500 mg every 12 hours. EEG negative  6. Hypertension. Avapro 150 mg daily. Monitor with increased activity 7.  Dysphagia, pocketing soft textures and staff supervision 8.  Macrocytosis with borderline low RBCs MMA nl, RBC folate nl, admits to 1 beer a day at home-monitor LOS (Days) 8 A FACE TO FACE EVALUATION WAS PERFORMED  Gaetana Kawahara E 09/30/2012, 9:42 AM

## 2012-10-01 ENCOUNTER — Inpatient Hospital Stay (HOSPITAL_COMMUNITY): Payer: BC Managed Care – PPO | Admitting: Occupational Therapy

## 2012-10-01 ENCOUNTER — Inpatient Hospital Stay (HOSPITAL_COMMUNITY): Payer: BC Managed Care – PPO | Admitting: *Deleted

## 2012-10-01 ENCOUNTER — Inpatient Hospital Stay (HOSPITAL_COMMUNITY): Payer: BC Managed Care – PPO | Admitting: Speech Pathology

## 2012-10-01 NOTE — Care Management Utilization Note (Signed)
Spoke with [employer] to give clinical update for pt.  She has authorized her for seven more days. Next update due 2/27.  Authorization [id number]

## 2012-10-01 NOTE — Progress Notes (Signed)
Physical Therapy Session Note  Patient Details  Name: Stephanie Mason MRN: [id number] Date of Birth: [birth year]  Today's Date: 10/01/2012 Time: 7829-5621 Time Calculation (min): 60 min  Short Term Goals: Week 2:  PT Short Term Goal 1 (Week 2): Patient will perform squat pivot transfers with mod assist. PT Short Term Goal 2 (Week 2): Patient will perform bed mobility with mod assist. PT Short Term Goal 3 (Week 2): Patient will propel wheelchair 150' with min assist. PT Short Term Goal 4 (Week 2): Patient will perform dynamic sitting activities outside BOS with supervision. PT Short Term Goal 5 (Week 2): Patient will ambulate 4' with LRAD and mod assist.  Skilled Therapeutic Interventions/Progress Updates:    Patient received sitting in wheelchair. Today's session focused on wheelchair mobility, pre-gait activities, and weight shifting/scooting along edge of mat. Patient's boyfriend present for second half of session. Patient performed pre-gait activities with R UE support on handrail: lateral weight shifts 4 sets x15 reps, R side stepping 2 sets x10 reps, and R LE forward stepping x20 reps with emphasis on L total knee extension in stance phase. Noted increased tone in L hamstrings. Intermittent L knee buckling with increased weight bearing during pre-gait activities. Patient with difficulty maintaining lateral weight shift to the L prior to stepping.  Patient performed scooting along edge of mat with emphasis on anterior weight shifts and proper B LE advancement and placement. Patient initially requires min assist both directions and is able to progress to supervision for scooting to both sides with verbal and tactile cues to properly position L LE.  Patient returned to room and left seated in wheelchair with seatbelt donned and all needs within reach.  Therapy Documentation Precautions:  Precautions Precautions: Fall Precaution Comments: dense L hemiplegia, left  neglect Restrictions Weight Bearing Restrictions: No Pain: Pain Assessment Pain Assessment: No/denies pain Pain Score: 0-No pain Locomotion : Ambulation Ambulation/Gait Assistance: 1: +1 Total assist Wheelchair Mobility Distance: 155   See FIM for current functional status  Therapy/Group: Individual Therapy  Chipper Herb. Elmira Olkowski, PT, DPT  10/01/2012, 4:40 PM

## 2012-10-01 NOTE — Progress Notes (Signed)
Speech Language Pathology Daily Session Note  Patient Details  Name: Stephanie Mason MRN: [id number] Date of Birth: 08-14-1950  Today's Date: 10/01/2012 Time: 1005-1050 Time Calculation (min): 45 min  Short Term Goals: Week 2: SLP Short Term Goal 1 (Week 2): Patient will consume Dys.3 textures and thin liquids with supervision vebral cues to utilize safe swallow compensatory strategies. SLP Short Term Goal 2 (Week 2): Patient will attend to left upper extremity and environment with mod assist verbal cues. SLP Short Term Goal 3 (Week 2): Patient will request help as needed with various tasks with mod assist question cues. SLP Short Term Goal 4 (Week 2): Patient will utilize speech intelligibility strategies during verbal epression with min assist verbal and non-verbal cues.  Skilled Therapeutic Interventions: Skilled treatment session focused on addressing dysphagia and cognition goals.  SLP facilitated session with 4a placement of NMES to address left-sided labial weakness.  SLP set NMES to 6.34mU while patient performed oral motor exercises with mirror and min assist verbal clinician cues with improved range of motion and reduced left-sided anterior loss of thin liquids, 10% of opportunities.   SLP also facilitated session with moderately complex daily problem solving with shopping list and budget management task with mod-max assist verbal cues to scan left, initiate solutions, self-monitor and correct.     FIM:  Comprehension Comprehension Mode: Auditory Comprehension: 5-Follows basic conversation/direction: With extra time/assistive device Expression Expression Mode: Verbal Expression: 4-Expresses basic 75 - 89% of the time/requires cueing 10 - 24% of the time. Needs helper to occlude trach/needs to repeat words. Social Interaction Social Interaction: 4-Interacts appropriately 75 - 89% of the time - Needs redirection for appropriate language or to initiate interaction. Problem  Solving Problem Solving: 3-Solves basic 50 - 74% of the time/requires cueing 25 - 49% of the time Memory Memory: 4-Recognizes or recalls 75 - 89% of the time/requires cueing 10 - 24% of the time FIM - Eating Eating Activity: 4: Helper checks for pocketed food;5: Supervision/cues;5: Set-up assist for open containers  Pain Pain Assessment Pain Assessment: No/denies pain  Therapy/Group: Individual Therapy  Charlane Ferretti., CCC-SLP [phone]  Stephanie Mason 10/01/2012, 3:52 PM

## 2012-10-01 NOTE — Progress Notes (Signed)
Occupational Therapy Session Note  Patient Details  Name: [name] MRN: [id number] Date of Birth: 1951-02-17  Today's Date: 10/01/2012 Time: 0800-0900 and 1145-1200 (co-treat with SLP) Time Calculation (min): 60 min and 15 min  Short Term Goals: Week 1:  OT Short Term Goal 1 (Week 1): Pt will maintain static sitting with supervision for 2 mins in preparation for selfcare tasks. OT Short Term Goal 2 (Week 1): Pt will complete bathing sit to stand with mod assist. OT Short Term Goal 3 (Week 1): Pt will locate 3 grooming/bathing items left of midline with no more than mod questioning cues. OT Short Term Goal 4 (Week 1): Pt will donn a pullover shirt with min assist and min instructional cueing to follow hemi techniques. OT Short Term Goal 5 (Week 1): Pt will perform toilet transfer stand pivot with mod assist to 3:1.  Skilled Therapeutic Interventions/Progress Updates:  1)  Individual therapy session.  Patient engaged in self care retraining to include sponge bath, dress and groom tasks with focus on attention to left and management of left UE, hemi bathing and dressing, sitting and standing balance, safe transfers.  Patient required max cue to manage LUE during tasks.  2)  [employer] group session co-treat with SLP.  Focus of session was self feeding, attention to left visual field and LUE, and adhere to safe swallowing strategies to include tongue sweep to check for pocketing.  Therapy Documentation Precautions:  Precautions Precautions: Fall Precaution Comments: dense L hemiplegia, incresed extensor tone LLE, left neglect Restrictions Weight Bearing Restrictions: No Pain: 1) 6/10 LLE, rest, repositioned, RN aware and medication provided, 2)  Denies pain  Stephanie Mason 10/01/2012, 12:39 PM

## 2012-10-01 NOTE — Progress Notes (Signed)
Speech Language Pathology Daily Session Note  Patient Details  Name: Stephanie Mason MRN: [id number] Date of Birth: 1951-06-14  Today's Date: 10/01/2012 Time: 1130-1145 Time Calculation (min): 15 min  Short Term Goals: Week 2: SLP Short Term Goal 1 (Week 2): Patient will consume Dys.3 textures and thin liquids with supervision vebral cues to utilize safe swallow compensatory strategies. SLP Short Term Goal 2 (Week 2): Patient will attend to left upper extremity and environment with mod assist verbal cues. SLP Short Term Goal 3 (Week 2): Patient will request help as needed with various tasks with mod assist question cues. SLP Short Term Goal 4 (Week 2): Patient will utilize speech intelligibility strategies during verbal epression with min assist verbal and non-verbal cues.  Skilled Therapeutic Interventions: Co-treatment with OT with focus on self-feeding and dysphagia goals. Pt consumed Dys. 3 textures with thin liquids without overt s/s of aspiration but required Min A visual cues to monitor left buccal pocketing.    FIM:  Comprehension Comprehension Mode: Auditory Comprehension: 5-Follows basic conversation/direction: With extra time/assistive device Expression Expression Mode: Verbal Expression: 4-Expresses basic 75 - 89% of the time/requires cueing 10 - 24% of the time. Needs helper to occlude trach/needs to repeat words. Social Interaction Social Interaction: 4-Interacts appropriately 75 - 89% of the time - Needs redirection for appropriate language or to initiate interaction. Problem Solving Problem Solving: 3-Solves basic 50 - 74% of the time/requires cueing 25 - 49% of the time Memory Memory: 4-Recognizes or recalls 75 - 89% of the time/requires cueing 10 - 24% of the time FIM - Eating Eating Activity: 5: Supervision/cues;5: Set-up assist for open containers  Pain Pain Assessment Pain Assessment: No/denies pain Pain Score: 0-No pain  Therapy/Group: Group  Therapy  Nohea Kras 10/01/2012, 4:43 PM

## 2012-10-01 NOTE — Progress Notes (Addendum)
Patient ID: Stephanie Mason, female   DOB: 04-11-1951, 62 y.o.   MRN: [id number] Stephanie Mason is a 62 y.o. 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 Admitted 09/15/2012 to [facility] with left-sided weakness. Patient with noted seizure in the ED and received Ativan as well as loaded with Keppra. MRI of the brain showed large acute partially hemorrhagic right hemispheric infarct superimposed upon remote infarct. Patient did receive TPA. Echocardiogram with normal left ventricular function no source of embolus. EEG showed no seizure activity. Carotid Dopplers with no ICA stenosis. TCD with right terminal ICA stenosis. Patient was transferred to [facility] for ongoing evaluation. Placed on aspirin therapy for stroke prophylaxis as well as subcutaneous Lovenox for DVT prophylaxis. TEE completed 09/22/2012 showing no PFO or thrombus. Maintained on a dysphagia 3 thin liquid diet. Chest x-ray with questionable mass that was ruled out with CT of the chest  Subjective/Complaints: Using snuff against hospital policy Intermittent spasms of LUE and LLE improved on Zanaflex, no drowsiness  Objective: Vital Signs: Blood pressure 108/70, pulse 67, temperature 98.2 F (36.8 C), temperature source Oral, resp. rate 20, height 5\' 7"  (1.702 m), weight 71.6 kg (157 lb 13.6 oz), SpO2 95.00%. No results found. Results for orders placed during the hospital encounter of 09/22/12 (from the past 72 hour(s))  CREATININE, SERUM     Status: None   Collection Time    09/29/12  7:35 AM      Result Value Range   Creatinine, Ser 0.68  0.50 - 1.10 mg/dL   GFR calc non Af Amer >90  >90 mL/min   GFR calc Af Amer >90  >90 mL/min   Comment:            The eGFR has been calculated     using the CKD EPI equation.     This calculation has not been     validated in all clinical     situations.     eGFR's persistently     <90 mL/min signify     possible  Chronic Kidney Disease.     HEENT: Left lower facial droop Cardio: RRR and no extra sounds Resp: CTA B/L and unlabored GI: BS positive and non tender Extremity:  Pulses positive and No Edema Skin:   Intact and Other no pressure areas Neuro: Lethargic, Flat, Cranial Nerve Abnormalities L central VII, Abnormal Sensory Absent to LT, [employer], Abnormal Motor 0/5 in LUE and LLE, Abnormal FMC Ataxic/ dec FMC, Reflexes: 0, Tone:  Hypotonia, Dysarthric and Inattention Musc/Skel:  Other no pain with Left shoulder ROM Gen NAD   Assessment/Plan: 1. Functional deficits secondary to Right MCA infarct with hemorrhage which require 3+ hours per day of interdisciplinary therapy in a comprehensive inpatient rehab setting. Physiatrist is providing close team supervision and 24 hour management of active medical problems listed below. Physiatrist and rehab team continue to assess barriers to discharge/monitor patient progress toward functional and medical goals. FIM: FIM - Bathing Bathing Steps Patient Completed: Chest;Abdomen;Front perineal area;Right upper leg;Left upper leg;Right lower leg (including foot);Left Arm Bathing: 3: Mod-Patient completes 5-7 24f 10 parts or 50-74%  FIM - Upper Body Dressing/Undressing Upper body dressing/undressing steps patient completed: Thread/unthread right sleeve of pullover shirt/dresss;Put head through opening of pull over shirt/dress Upper body dressing/undressing: 3: Mod-Patient completed 50-74% of tasks FIM - Lower Body Dressing/Undressing Lower body dressing/undressing steps patient completed: Thread/unthread right pants leg;Don/Doff right shoe Lower body dressing/undressing: 2: Max-Patient completed 25-49%  of tasks  FIM - Toileting Toileting steps completed by patient: Performs perineal hygiene Toileting Assistive Devices: Grab bar or rail for support Toileting: 2: Max-Patient completed 1 of 3 steps  FIM - Diplomatic Services operational officer Devices:  Grab bars Toilet Transfers: 3-To toilet/BSC: Mod A (lift or lower assist);3-From toilet/BSC: Mod A (lift or lower assist)  FIM - Bed/Chair Transfer Bed/Chair Transfer Assistive Devices: Bed rails;HOB elevated;Arm rests Bed/Chair Transfer: 2: Supine > Sit: Max A (lifting assist/Pt. 25-49%);2: Bed > Chair or W/C: Max A (lift and lower assist);2: Chair or W/C > Bed: Max A (lift and lower assist)  FIM - Locomotion: Wheelchair Distance: 155 Locomotion: Wheelchair: 4: Travels 150 ft or more: maneuvers on rugs and over door sillls with minimal assistance (Pt.>75%) FIM - Locomotion: Ambulation Locomotion: Ambulation Assistive Devices: Other (comment) (R handrail) Ambulation/Gait Assistance: Not tested (comment) Locomotion: Ambulation: 0: Activity did not occur  Comprehension Comprehension Mode: Auditory Comprehension: 5-Follows basic conversation/direction: With extra time/assistive device  Expression Expression Mode: Verbal Expression: 4-Expresses basic 75 - 89% of the time/requires cueing 10 - 24% of the time. Needs helper to occlude trach/needs to repeat words.  Social Interaction Social Interaction Mode: Asleep Social Interaction: 4-Interacts appropriately 75 - 89% of the time - Needs redirection for appropriate language or to initiate interaction.  Problem Solving Problem Solving Mode: Asleep Problem Solving: 3-Solves basic 50 - 74% of the time/requires cueing 25 - 49% of the time  Memory Memory Mode: Asleep Memory: 4-Recognizes or recalls 75 - 89% of the time/requires cueing 10 - 24% of the time  Medical Problem List and Plan:  1. Partially hemorrhagic right MCA infarct superimposed upon remote right brain infarct with dense left hemiparesthesias and visual spatial deficits , developing intermittent painful spasms  zanaflex 2. DVT Prophylaxis/Anticoagulation: Subcutaneous Lovenox. Monitor platelet counts and any signs of bleeding  3. Pain Management: Ultram as needed. Monitor with  increased mobility  4. Neuropsych: This patient is not capable of making decisions on his/her own behalf.  5. Seizure disorder. Keppra 500 mg every 12 hours. EEG negative  6. Hypertension. Avapro 150 mg daily. Monitor with increased activity 7.  Dysphagia, pocketing soft textures and staff supervision 8.  Macrocytosis with borderline low RBCs MMA nl, RBC folate nl, admits to 1 beer a day at home-monitor 9.  Poor attention, ritalin trial LOS (Days) 9 A FACE TO FACE EVALUATION WAS PERFORMED  KIRSTEINS,ANDREW E 10/01/2012, 7:41 AM

## 2012-10-02 ENCOUNTER — Encounter (HOSPITAL_COMMUNITY): Payer: BC Managed Care – PPO

## 2012-10-02 ENCOUNTER — Inpatient Hospital Stay (HOSPITAL_COMMUNITY): Payer: BC Managed Care – PPO | Admitting: Occupational Therapy

## 2012-10-02 ENCOUNTER — Inpatient Hospital Stay (HOSPITAL_COMMUNITY): Payer: BC Managed Care – PPO | Admitting: *Deleted

## 2012-10-02 ENCOUNTER — Inpatient Hospital Stay (HOSPITAL_COMMUNITY): Payer: BC Managed Care – PPO | Admitting: Speech Pathology

## 2012-10-02 DIAGNOSIS — I6330 Cerebral infarction due to thrombosis of unspecified cerebral artery: Secondary | ICD-10-CM

## 2012-10-02 MED ORDER — GABAPENTIN 300 MG PO CAPS
300.0000 mg | ORAL_CAPSULE | Freq: Two times a day (BID) | ORAL | Status: DC
  Administered 2012-10-02 – 2012-10-14 (×25): 300 mg via ORAL
  Filled 2012-10-02 (×29): qty 1

## 2012-10-02 MED ORDER — GABAPENTIN 600 MG PO TABS
300.0000 mg | ORAL_TABLET | Freq: Two times a day (BID) | ORAL | Status: DC
  Filled 2012-10-02 (×3): qty 0.5

## 2012-10-02 NOTE — Progress Notes (Signed)
Occupational Therapy Weekly Progress Notes  Patient Details  Name: Joory Gough MRN: [id number] Date of Birth: 1951-01-13  Today's Date: 10/02/2012 Time: 0800-0900 and 1130-1145 (Co-tx with SLP) Time Calculation (min): 60 min and 15 min  Patient has met 3 of 5 short term goals.  Patient is progressing toward the STGs of Min with UB dressing and Mod assist with stand pivot toilet transfer.  Patient is Mod assist and Mod vcs for squat pivot transfer at this time.  Patient continues to demonstrate the following deficits: muscle weakness, impaired timing and sequencing, abnormal tone, unbalanced muscle activation and decreased coordination, left field cut and decreased attention to left and left side neglect and therefore will continue to benefit from skilled OT intervention to enhance overall performance with BADL, iADL and Reduce care partner burden.  Patient progressing toward long term goals.  Continue plan of care.  OT Short Term Goals Week 1:  OT Short Term Goal 1 (Week 1): Pt will maintain static sitting with supervision for 2 mins in preparation for selfcare tasks. OT Short Term Goal 1 - Progress (Week 1): Met OT Short Term Goal 2 (Week 1): Pt will complete bathing sit to stand with mod assist. OT Short Term Goal 2 - Progress (Week 1): Met OT Short Term Goal 3 (Week 1): Pt will locate 3 grooming/bathing items left of midline with no more than mod questioning cues. OT Short Term Goal 3 - Progress (Week 1): Met OT Short Term Goal 4 (Week 1): Pt will donn a pullover shirt with min assist and min instructional cueing to follow hemi techniques. OT Short Term Goal 4 - Progress (Week 1): Progressing toward goal OT Short Term Goal 5 (Week 1): Pt will perform toilet transfer stand pivot with mod assist to 3:1. OT Short Term Goal 5 - Progress (Week 1): Progressing toward goal Week 2: OT Short Term Goal 1 (Week 2): UB Dressing:  Min assist with pull over shirt with min instructional cues to  follow hemi techniques OT Short Term Goal 2 (Week 2): Dynamic Standing Balance:  Mod assist with standing during LB bath, LB dressing and toileting tasks OT Short Term Goal 3 (Week 2): Toilet Transfer:  Mod assist with stand pivot transfer to and from toilet OT Short Term Goal 4 (Week 2): LUE Management:  Mod vcs for management and positioning of LUE during all BADL tasks, and functional mobility.  OT Short Term Goal 5 (Week 2): Vision:  Patient will locate items/obstacles on her left with no more than mod questioning cues.  Skilled Therapeutic Interventions/Progress Updates:  1)  Self care retraining to include sponge bath, dress and groom tasks.  Focused session on bed mobility technique, forced use of LLE with all mobility, dynamic sitting and standing balance, attention to left visual field and LUE, hemi dressing techniques, issued shoe buttons to improve independence with LB dressing.  Patient tends to want to complete tasks quickly and compensate for deficits instead of work on her deficits.  Once this is brought to her attention she wants to work on deficits yet requires verbal cues and facilitation for slow and controlled movements to include quality of movement.  2)  Pt participated in [employer] self feeding group with focus on  self feeding tasks, attention to left visual field and LUE, and performing tongue sweep to clear oral cavity.  Pt required mod verbal cues to clear oral cavity, attend to left, and monitor spillage/food on left side of mouth.    Therapy  Documentation Precautions:  Precautions Precautions: Fall Precaution Comments: dense L hemiplegia, left neglect Restrictions Weight Bearing Restrictions: No Pain: 1)  6/10 in LLE, rest breaks, position changes, & RN provided medication ADL: See FIM for current functional status  Therapy/Group: Individual Therapy and Group Therapy  Trinette Vera 10/02/2012, 10:54 AM

## 2012-10-02 NOTE — Progress Notes (Signed)
Physical Therapy Session Note  Patient Details  Name: Stephanie Mason MRN: [id number] Date of Birth: 1950/10/11  Today's Date: 10/02/2012 Time: 4098-1191 and 4782-9562 Time Calculation (min): 59 min and 30 min  Short Term Goals: Week 2:  PT Short Term Goal 1 (Week 2): Patient will perform squat pivot transfers with mod assist. PT Short Term Goal 2 (Week 2): Patient will perform bed mobility with mod assist. PT Short Term Goal 3 (Week 2): Patient will propel wheelchair 150' with min assist. PT Short Term Goal 4 (Week 2): Patient will perform dynamic sitting activities outside BOS with supervision. PT Short Term Goal 5 (Week 2): Patient will ambulate 63' with LRAD and mod assist.  Skilled Therapeutic Interventions/Progress Updates:    AM Session: Patient received sitting in wheelchair. Today's session focused on wheelchair mobility, bed mobility, and weight bearing techniques, see details below. Patient returned to room and left seated in wheelchair with seatbelt donned and all needs within reach.  PM Session: Patient received sitting in wheelchair and son present for entire session. Patient instructed in wheelchair mobility 155' x2 with supervision.  Patient performed pre-gait activities with R UE support on stair railing: lateral weight shifts 3 sets x15 reps, R side stepping 2 sets x10 reps. Noted increased tone in L hamstrings causes difficulty to maintain L foot on floor with weight bearing. Intermittent L knee buckling with increased weight bearing during pre-gait activities. Patient instructed in gait training x25' with R handrail and max assist required for upright posture, midline orientation, advancement and placement of L LE, and stabilization of L knee during stance phase. +2 required for wheelchair follow.  Patient returned to room and left seated in wheelchair with seatbelt donned and all needs within reach.   Therapy Documentation Precautions:  Precautions Precautions:  Fall Precaution Comments: dense L hemiplegia, left neglect Restrictions Weight Bearing Restrictions: No Pain: Pain Assessment Pain Assessment: No/denies pain Pain Score: 0-No pain Mobility: Bed Mobility Bed Mobility: Sitting - Scoot to Edge of Bed;Supine to Sit;Sit to Supine;Rolling Right;Rolling Left Rolling Right: 3: Mod assist Rolling Right Details: Manual facilitation for weight bearing;Manual facilitation for weight shifting;Verbal cues for sequencing;Verbal cues for technique Rolling Right Details (indicate cue type and reason): Patient requires manual facilitation for proper placement of L LE and weight bearing through L LE to push through and roll to the R. Rolling Left: 5: Supervision Rolling Left Details: Verbal cues for sequencing;Verbal cues for technique Supine to Sit: HOB flat;3: Mod assist Supine to Sit Details: Manual facilitation for weight shifting;Verbal cues for sequencing;Verbal cues for technique;Verbal cues for precautions/safety Sitting - Scoot to Edge of Bed: 4: Min assist Sitting - Scoot to [employer] of Bed Details: Manual facilitation for weight shifting;Manual facilitation for placement;Verbal cues for precautions/safety;Verbal cues for sequencing Transfers Squat Pivot Transfers: 2: Max assist;With Biomedical engineer Details: Tactile cues for initiation;Manual facilitation for placement;Manual facilitation for weight bearing;Verbal cues for sequencing;Verbal cues for technique;Verbal cues for precautions/safety Locomotion : Ambulation Ambulation/Gait Assistance: Not tested (comment) Wheelchair Mobility Distance: 155 x2  Other Treatments: Treatments Neuromuscular Facilitation: Left;Lower Extremity;Activity to increase coordination;Activity to increase motor control;Activity to increase timing and sequencing;Activity to increase sustained activation. PNF D1 Pattern for L LE 2 sets x10 reps with moderate resistance. Patient requires facilitation for D1  flexion, but can resist moderate resistance with D1 extension. Patient also performed x20 bridges to facilitate increased weight bearing through L LE. Patient able to stabilize L LE and maintain appropriate positioning without assistance. Weight Bearing Technique  Weight Bearing Technique: Yes LUE Weight Bearing Technique: Prone;Quadruped (Attempted quadruped, but unable to achieve). Prone on elbows 1x2', 1x1' to facilitate increased weight bearing through L UE. Response to Weight Bearing Technique: Patient requires stabilization at L UE.  See FIM for current functional status  Therapy/Group: Individual Therapy  Chipper Herb. Dougles Kimmey, PT, DPT  10/02/2012, 12:10 PM

## 2012-10-02 NOTE — Progress Notes (Signed)
Pt was a full supervision with Burr Medico, NT in the room. When pt finished the meal Sharon left pt resting in chair with call bell in reach. At 1800, Pt was noted on the floor a few seconds later with Staff rushing to room. Pt was found with left knee hyperflexed. Pt put back to bed with staff help. Left knee is slightly swollen with no complaints of abnormal pain. Pt stated she was reaching for her water and just slipped out of the chair. VS stable and WNL. Call bell within reach, bed alarm on and SR x3 up and  Items within reach. MD notified and ordered Ice for the left knee at this time and monitor for swelling. 100mg  ultram given for pain in left arm and leg. Cont. to monitor pt.

## 2012-10-02 NOTE — Progress Notes (Signed)
Speech Language Pathology Daily Session Note  Patient Details  Name: Stephanie Mason MRN: [id number] Date of Birth: [birth year]  Today's Date: 10/02/2012 Time: 2130-8657 Time Calculation (min): 25 min  Short Term Goals: Week 2: SLP Short Term Goal 1 (Week 2): Patient will consume Dys.3 textures and thin liquids with supervision vebral cues to utilize safe swallow compensatory strategies. SLP Short Term Goal 2 (Week 2): Patient will attend to left upper extremity and environment with mod assist verbal cues. SLP Short Term Goal 3 (Week 2): Patient will request help as needed with various tasks with mod assist question cues. SLP Short Term Goal 4 (Week 2): Patient will utilize speech intelligibility strategies during verbal epression with min assist verbal and non-verbal cues.  Skilled Therapeutic Interventions: Group co-treatment with OT. SLP focused on dysphagia and cognition goals. SLP provided minimal assist with tray set up with patient using verbal request for help as needed. Patient demonstrated increased left side attention to objects during activity, however continues to have decreased attention to left anterior loss and required moderate verbal cues from SLP to attend to bolus loss. Patient demonstrates adequate use of compensatory strategies for use of lingual sweep to check for pocketing with no left buccal pocketing observed during meal. No overt s/s of aspiration and patient demonstrating use of compensatory strategies with min-mod verbal cues, recommend continue with current plan of care.   FIM:  Comprehension Comprehension Mode: Auditory Comprehension: 5-Understands basic 90% of the time/requires cueing < 10% of the time Expression Expression Mode: Verbal Expression: 5-Expresses basic needs/ideas: With extra time/assistive device Social Interaction Social Interaction: 5-Interacts appropriately 90% of the time - Needs monitoring or encouragement for participation or  interaction. Problem Solving Problem Solving: 5-Solves basic 90% of the time/requires cueing < 10% of the time Memory Memory: 5-Recognizes or recalls 90% of the time/requires cueing < 10% of the time FIM - Eating Eating Activity: 5: Set-up assist for open containers;5: Supervision/cues  Pain Pain Assessment Pain Assessment: No/denies pain Pain Score: 0-No pain  Therapy/Group: Group Therapy Berdine Dance SLP student Berdine Dance 10/02/2012, 3:14 PM

## 2012-10-02 NOTE — Progress Notes (Signed)
The skilled treatment note has been reviewed and SLP is in agreement. Graham Doukas, M.A., CCC-SLP [phone]  

## 2012-10-02 NOTE — Progress Notes (Signed)
The skilled treatment note has been reviewed and SLP is in agreement. Amadeus Oyama, M.A., CCC-SLP [phone]  

## 2012-10-02 NOTE — Progress Notes (Signed)
Patient ID: Stephanie Mason, female   DOB: 08-29-1950, 62 y.o.   MRN: [id number] Stephanie Mason is a 62 y.o. 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 Admitted 09/15/2012 to [facility] with left-sided weakness. Patient with noted seizure in the ED and received Ativan as well as loaded with Keppra. MRI of the brain showed large acute partially hemorrhagic right hemispheric infarct superimposed upon remote infarct. Patient did receive TPA. Echocardiogram with normal left ventricular function no source of embolus. EEG showed no seizure activity. Carotid Dopplers with no ICA stenosis. TCD with right terminal ICA stenosis. Patient was transferred to [facility] for ongoing evaluation. Placed on aspirin therapy for stroke prophylaxis as well as subcutaneous Lovenox for DVT prophylaxis. TEE completed 09/22/2012 showing no PFO or thrombus. Maintained on a dysphagia 3 thin liquid diet. Chest x-ray with questionable mass that was ruled out with CT of the chest  Subjective/Complaints: Pain in Left forearm and left leg Intermittent spasms of LUE and LLE improved on Zanaflex, no drowsiness  Objective: Vital Signs: Blood pressure 116/73, pulse 81, temperature 98.1 F (36.7 C), temperature source Oral, resp. rate 18, height 5\' 7"  (1.702 m), weight 71.6 kg (157 lb 13.6 oz), SpO2 95.00%. No results found. No results found for this or any previous visit (from the past 72 hour(s)).   HEENT: Left lower facial droop Cardio: RRR and no extra sounds Resp: CTA B/L and unlabored GI: BS positive and non tender Extremity:  Pulses positive and No Edema Skin:   Intact and Other no pressure areas Neuro: Lethargic, Flat, Cranial Nerve Abnormalities L central VII, Abnormal Sensory Absent to LT, Proprio, Abnormal Motor 0/5 in LUE and LLE, Abnormal FMC Ataxic/ dec FMC, Reflexes: 0, Tone:  Hypotonia, Dysarthric and Inattention Musc/Skel:  Other no pain with  Left shoulder ROM Gen NAD   Assessment/Plan: 1. Functional deficits secondary to Right MCA infarct with hemorrhage which require 3+ hours per day of interdisciplinary therapy in a comprehensive inpatient rehab setting. Physiatrist is providing close team supervision and 24 hour management of active medical problems listed below. Physiatrist and rehab team continue to assess barriers to discharge/monitor patient progress toward functional and medical goals. FIM: FIM - Bathing Bathing Steps Patient Completed: Chest;Abdomen;Right upper leg;Left upper leg;Right lower leg (including foot);Left Arm (reports RN cleaned peri area and buttocks) Bathing: 4: Min-Patient completes 8-9 60f 10 parts or 75+ percent  FIM - Upper Body Dressing/Undressing Upper body dressing/undressing steps patient completed: Thread/unthread right sleeve of pullover shirt/dresss;Put head through opening of pull over shirt/dress;Pull shirt over trunk Upper body dressing/undressing: 4: Min-Patient completed 75 plus % of tasks FIM - Lower Body Dressing/Undressing Lower body dressing/undressing steps patient completed: Thread/unthread right pants leg;Thread/unthread left pants leg;Pull pants up/down;Don/Doff right shoe;Don/Doff left shoe (wearing diaper) Lower body dressing/undressing: 3: Mod-Patient completed 50-74% of tasks  FIM - Toileting Toileting steps completed by patient: Performs perineal hygiene Toileting Assistive Devices: Grab bar or rail for support Toileting: 2: Max-Patient completed 1 of 3 steps  FIM - Diplomatic Services operational officer Devices: Grab bars Toilet Transfers: 3-To toilet/BSC: Mod A (lift or lower assist);3-From toilet/BSC: Mod A (lift or lower assist)  FIM - Bed/Chair Transfer Bed/Chair Transfer Assistive Devices: Arm rests Bed/Chair Transfer: 3: Supine > Sit: Mod A (lifting assist/Pt. 50-74%/lift 2 legs;3: Bed > Chair or W/C: Mod A (lift or lower assist)  FIM - Locomotion:  Wheelchair Distance: 155 Locomotion: Wheelchair: 4: Travels 150 ft or more: maneuvers  on rugs and over door sillls with minimal assistance (Pt.>75%) FIM - Locomotion: Ambulation Locomotion: Ambulation Assistive Devices: Other (comment) (R handrail) Ambulation/Gait Assistance: 1: +1 Total assist Locomotion: Ambulation: 1: Travels less than 50 ft with total assistance/helper does all (Pt.<25%)  Comprehension Comprehension Mode: Auditory Comprehension: 5-Follows basic conversation/direction: With extra time/assistive device  Expression Expression Mode: Verbal Expression: 4-Expresses basic 75 - 89% of the time/requires cueing 10 - 24% of the time. Needs helper to occlude trach/needs to repeat words.  Social Interaction Social Interaction Mode: Asleep Social Interaction: 4-Interacts appropriately 75 - 89% of the time - Needs redirection for appropriate language or to initiate interaction.  Problem Solving Problem Solving Mode: Asleep Problem Solving: 3-Solves basic 50 - 74% of the time/requires cueing 25 - 49% of the time  Memory Memory Mode: Asleep Memory: 4-Recognizes or recalls 75 - 89% of the time/requires cueing 10 - 24% of the time  Medical Problem List and Plan:  1. Partially hemorrhagic right MCA infarct superimposed upon remote right brain infarct with dense left hemiparesthesias and visual spatial deficits , developing intermittent painful spasms  Zanaflex, trial gabapentin for painful parasthesias 2. DVT Prophylaxis/Anticoagulation: Subcutaneous Lovenox. Monitor platelet counts and any signs of bleeding  3. Pain Management: Ultram as needed. Monitor with increased mobility  4. Neuropsych: This patient is not capable of making decisions on his/her own behalf.  5. Seizure disorder. Keppra 500 mg every 12 hours. EEG negative  6. Hypertension. Avapro 150 mg daily. Monitor with increased activity 7.  Dysphagia, pocketing soft textures and staff supervision 8.  Macrocytosis with  borderline low RBCs MMA nl, RBC folate nl, admits to 1 beer a day at home-monitor 9.  Poor attention, ritalin trial LOS (Days) 10 A FACE TO FACE EVALUATION WAS PERFORMED  Stephanie Mason E 10/02/2012, 7:43 AM

## 2012-10-02 NOTE — Progress Notes (Signed)
Speech Language Pathology Daily Session Note  Patient Details  Name: Stephanie Mason MRN: [id number] Date of Birth: 01/19/1951  Today's Date: 10/02/2012 Time: 1030-1100 Time Calculation (min): 30 min  Short Term Goals: Week 2: SLP Short Term Goal 1 (Week 2): Patient will consume Dys.3 textures and thin liquids with supervision vebral cues to utilize safe swallow compensatory strategies. SLP Short Term Goal 2 (Week 2): Patient will attend to left upper extremity and environment with mod assist verbal cues. SLP Short Term Goal 3 (Week 2): Patient will request help as needed with various tasks with mod assist question cues. SLP Short Term Goal 4 (Week 2): Patient will utilize speech intelligibility strategies during verbal epression with min assist verbal and non-verbal cues.  Skilled Therapeutic Interventions: Skilled treatment session focused on cognitive goals. Patient demonstrated use of daily recall iwht route finding activity with mod I and supervision verbal cues for problem solving by directing herself via wheelchair to therapy room with increased attention to left side and required 1x verbal cue to reinforce direction. Patient demonstrates appropriate anticipatory awareness during functional tasks and structured therapy activity with moderate verbal cues from SLP. Continue with current plan of care for cognitive linguistic therapy.   FIM:  Comprehension Comprehension Mode: Auditory Comprehension: 5-Understands basic 90% of the time/requires cueing < 10% of the time Expression Expression Mode: Verbal Expression: 5-Expresses basic needs/ideas: With extra time/assistive device Social Interaction Social Interaction: 5-Interacts appropriately 90% of the time - Needs monitoring or encouragement for participation or interaction. Problem Solving Problem Solving: 5-Solves basic 90% of the time/requires cueing < 10% of the time Memory Memory: 5-Recognizes or recalls 90% of the time/requires  cueing < 10% of the time FIM - Eating Eating Activity: 5: Set-up assist for open containers;5: Supervision/cues  Pain Pain Assessment Pain Assessment: No/denies pain Pain Score: 0-No pain  Therapy/Group: Individual Therapy  Berdine Dance SLP student Berdine Dance 10/02/2012, 3:24 PM

## 2012-10-03 ENCOUNTER — Inpatient Hospital Stay (HOSPITAL_COMMUNITY): Payer: BC Managed Care – PPO | Admitting: Speech Pathology

## 2012-10-03 ENCOUNTER — Inpatient Hospital Stay (HOSPITAL_COMMUNITY): Payer: BC Managed Care – PPO | Admitting: Occupational Therapy

## 2012-10-03 DIAGNOSIS — I6330 Cerebral infarction due to thrombosis of unspecified cerebral artery: Secondary | ICD-10-CM

## 2012-10-03 NOTE — Progress Notes (Signed)
Occupational Therapy Session Note  Patient Details  Name: Stephanie Mason MRN: [id number] Date of Birth: 23-Nov-1950  Today's Date: 10/03/2012 Time: 1100-1130 Time Calculation (min): 30 min  Short Term Goals: Week 1:  OT Short Term Goal 1 (Week 1): Pt will maintain static sitting with supervision for 2 mins in preparation for selfcare tasks. OT Short Term Goal 1 - Progress (Week 1): Met OT Short Term Goal 2 (Week 1): Pt will complete bathing sit to stand with mod assist. OT Short Term Goal 2 - Progress (Week 1): Met OT Short Term Goal 3 (Week 1): Pt will locate 3 grooming/bathing items left of midline with no more than mod questioning cues. OT Short Term Goal 3 - Progress (Week 1): Met OT Short Term Goal 4 (Week 1): Pt will donn a pullover shirt with min assist and min instructional cueing to follow hemi techniques. OT Short Term Goal 4 - Progress (Week 1): Progressing toward goal OT Short Term Goal 5 (Week 1): Pt will perform toilet transfer stand pivot with mod assist to 3:1. OT Short Term Goal 5 - Progress (Week 1): Progressing toward goal Week 2:  OT Short Term Goal 1 (Week 2): UB Dressing:  Min assist with pull over shirt with min instructional cues to follow hemi techniques OT Short Term Goal 2 (Week 2): Dynamic Standing Balance:  Mod assist with standing during LB bath, LB dressing and toileting tasks OT Short Term Goal 3 (Week 2): Toilet Transfer:  Mod assist with stand pivot transfer to and from toilet OT Short Term Goal 4 (Week 2): LUE Management:  Mod vcs for management and positioning of LUE during all BADL tasks, and functional mobility.  OT Short Term Goal 5 (Week 2): Vision:  Patient will locate items/obstacles on her left with no more than mod questioning cues.  Skilled Therapeutic Interventions/Progress Updates:    Pt was resting in bed with gown on. She had fallen last night and left knee was in pain.  Pt assisted with donning clothing and then she worked on sitting  balance with weight shifting to the left.  Needs max assist to elongate left trunk, but is able to lift right hip into the weight shift. Discussed and reviewed safe reaching limitations with her weak trunk musculature.   Towel slides on table to facilitate RUE movement, but not active movement observed. Quick release belt applied to chair and pt taken to din[employer].  Therapy Documentation Precautions:  Precautions Precautions: Fall Precaution Comments: dense L hemiplegia, left neglect Restrictions Weight Bearing Restrictions: No   Pain: Pain Assessment Pain Assessment: 0-10 Pain Score:   8 Pain Type: Acute pain Pain Location: Knee Pain Orientation: Left Pain Descriptors: Aching Pain Onset: On-going Patients Stated Pain Goal: 2 Pain Intervention(s): RN made aware;Repositioned ADL: See FIM for current functional status  Therapy/Group: Individual Therapy  Stephanie Mason 10/03/2012, 12:11 PM

## 2012-10-03 NOTE — Consult Note (Signed)
10/02/12  PSYCHOSOCIAL NOTE - CONFIDENTIAL  Stephanie Mason   Stephanie Mason is a 62 year old, right-handed, African-American female, who denied experiencing any major cognitive difficulties. She was seen on the [facility] to provide her with feedback regarding her recent neuropsychological consultation. She was previously diagnosed with stroke, but no neurocognitive testing had purportedly been performed. Results were as follows:   Overall, Stephanie Mason is seemingly suffering from a significant degree of cognitive disruption consistent with the type and placement of the stroke she suffered. Her previous complex medical history may also be contributing to her present medical status. Depression may also be exacerbating her deficits but is not likely the driving force. Functionally, Stephanie Mason performance meets the criteria for a dementia diagnosis, though it is my hope that will time and treatment that her cognitive and motor skills could improve.   She appeared to understand the results as presented and agreed to all recommendations. Greater than 50% of this visit was spent educating the patient about the possible diagnosis, prognosis, management plan, and in coordination of care. Afterward, time for questions was provided and when it was indicated that all questions were answered, the feedback session was concluded.   REFERRING DIAGNOSIS: Stroke  FINAL DIAGNOSES:  Stroke Depressive disorder, NOS  Total professional time including medical record review and feedback equals 4 units ([id number]).   Debbe Mounts, Psy.D.  Clinical Neuropsychologist

## 2012-10-03 NOTE — Progress Notes (Signed)
Patient ID: Stephanie Mason, female   DOB: 03/18/1951, 62 y.o.   MRN: [id number] Stephanie Mason is a 62 y.o. 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 Admitted 09/15/2012 to [facility] with left-sided weakness. Patient with noted seizure in the ED and received Ativan as well as loaded with Keppra. MRI of the brain showed large acute partially hemorrhagic right hemispheric infarct superimposed upon remote infarct. Patient did receive TPA. Echocardiogram with normal left ventricular function no source of embolus. EEG showed no seizure activity. Carotid Dopplers with no ICA stenosis. TCD with right terminal ICA stenosis. Patient was transferred to [facility] for ongoing evaluation. Placed on aspirin therapy for stroke prophylaxis as well as subcutaneous Lovenox for DVT prophylaxis. TEE completed 09/22/2012 showing no PFO or thrombus. Maintained on a dysphagia 3 thin liquid diet. Chest x-ray with questionable mass that was ruled out with CT of the chest  Subjective/Complaints: Pain in Left shoulder and left leg Intermittent spasms of LUE and LLE improved on Zanaflex, no drowsiness  Objective: Vital Signs: Blood pressure 134/87, pulse 67, temperature 97.2 F (36.2 C), temperature source Oral, resp. rate 18, height 5\' 7"  (1.702 m), weight 71.6 kg (157 lb 13.6 oz), SpO2 95.00%. No results found. No results found for this or any previous visit (from the past 72 hour(s)).   HEENT: Left lower facial droop Cardio: RRR and no extra sounds Resp: CTA B/L and unlabored GI: BS positive and non tender Extremity:  Pulses positive and No Edema Skin:   Intact and Other no pressure areas Neuro: Lethargic, Flat, Cranial Nerve Abnormalities L central VII, Abnormal Sensory Absent to LT, Proprio, Abnormal Motor 0/5 in LUE and LLE, Abnormal FMC Ataxic/ dec FMC, Reflexes: 0, Tone:  Trace to 1 tone left upper extremity and hip, Dysarthric and  Inattention Musc/Skel:  Other no pain with Left shoulder ROM Gen NAD   Assessment/Plan: 1. Functional deficits secondary to Right MCA infarct with hemorrhage which require 3+ hours per day of interdisciplinary therapy in a comprehensive inpatient rehab setting. Physiatrist is providing close team supervision and 24 hour management of active medical problems listed below. Physiatrist and rehab team continue to assess barriers to discharge/monitor patient progress toward functional and medical goals. FIM: FIM - Bathing Bathing Steps Patient Completed: Chest;Abdomen;Right upper leg;Left upper leg;Right lower leg (including foot);Left Arm (reports RN cleaned peri area and buttocks) Bathing: 4: Min-Patient completes 8-9 77f 10 parts or 75+ percent  FIM - Upper Body Dressing/Undressing Upper body dressing/undressing steps patient completed: Thread/unthread right sleeve of pullover shirt/dresss;Put head through opening of pull over shirt/dress;Pull shirt over trunk Upper body dressing/undressing: 4: Min-Patient completed 75 plus % of tasks FIM - Lower Body Dressing/Undressing Lower body dressing/undressing steps patient completed: Thread/unthread right pants leg;Thread/unthread left pants leg;Pull pants up/down;Don/Doff right shoe;Don/Doff left shoe (wearing diaper) Lower body dressing/undressing: 3: Mod-Patient completed 50-74% of tasks  FIM - Toileting Toileting steps completed by patient: Performs perineal hygiene Toileting Assistive Devices: Grab bar or rail for support Toileting: 2: Max-Patient completed 1 of 3 steps  FIM - Diplomatic Services operational officer Devices: Grab bars Toilet Transfers: 3-To toilet/BSC: Mod A (lift or lower assist);3-From toilet/BSC: Mod A (lift or lower assist)  FIM - Bed/Chair Transfer Bed/Chair Transfer Assistive Devices: Arm rests Bed/Chair Transfer: 3: Supine > Sit: Mod A (lifting assist/Pt. 50-74%/lift 2 legs;2: Bed > Chair or W/C: Max A (lift and  lower assist);2: Sit > Supine: Max A (lifting assist/Pt. 25-49%)  FIM - Locomotion: Wheelchair Distance: 155 Locomotion: Wheelchair: 5: Travels 150 ft or more: maneuvers on rugs and over door sills with supervision, cueing or coaxing FIM - Locomotion: Ambulation Locomotion: Ambulation Assistive Devices: Other (comment) (R handrail) Ambulation/Gait Assistance: Not tested (comment) Locomotion: Ambulation: 0: Activity did not occur  Comprehension Comprehension Mode: Auditory Comprehension: 5-Understands complex 90% of the time/Cues < 10% of the time  Expression Expression Mode: Verbal Expression: 5-Expresses basic needs/ideas: With no assist  Social Interaction Social Interaction Mode: Asleep Social Interaction: 5-Interacts appropriately 90% of the time - Needs monitoring or encouragement for participation or interaction.  Problem Solving Problem Solving Mode: Asleep Problem Solving: 5-Solves basic 90% of the time/requires cueing < 10% of the time  Memory Memory Mode: Asleep Memory: 5-Recognizes or recalls 90% of the time/requires cueing < 10% of the time  Medical Problem List and Plan:  1. Partially hemorrhagic right MCA infarct superimposed upon remote right brain infarct with dense left hemiparesthesias and visual spatial deficits , developing intermittent painful spasms  Zanaflex, trial gabapentin for painful parasthesias. Add muscle rub also to above regimen 2. DVT Prophylaxis/Anticoagulation: Subcutaneous Lovenox. Monitor platelet counts and any signs of bleeding  3. Pain Management: Ultram as needed. Monitor with increased mobility  4. Neuropsych: This patient is not capable of making decisions on his/her own behalf.  5. Seizure disorder. Keppra 500 mg every 12 hours. EEG negative  6. Hypertension. Avapro 150 mg daily. Monitor with increased activity 7.  Dysphagia, pocketing soft textures and staff supervision 8.  Macrocytosis with borderline low RBCs MMA nl, RBC folate nl,  admits to 1 beer a day at home-monitor 9.  Poor attention, ritalin trial LOS (Days) 11 A FACE TO FACE EVALUATION WAS PERFORMED  Willim Turnage T 10/03/2012, 8:12 AM

## 2012-10-03 NOTE — Progress Notes (Signed)
Speech Language Pathology Daily Session Note  Patient Details  Name: Stephanie Mason MRN: [id number] Date of Birth: February 10, 1951  Today's Date: 10/03/2012 Time: 1130-1145 Time Calculation (min): 15 min  Short Term Goals: Week 2: SLP Short Term Goal 1 (Week 2): Patient will consume Dys.3 textures and thin liquids with supervision vebral cues to utilize safe swallow compensatory strategies. SLP Short Term Goal 2 (Week 2): Patient will attend to left upper extremity and environment with mod assist verbal cues. SLP Short Term Goal 3 (Week 2): Patient will request help as needed with various tasks with mod assist question cues. SLP Short Term Goal 4 (Week 2): Patient will utilize speech intelligibility strategies during verbal epression with min assist verbal and non-verbal cues.  Skilled Therapeutic Interventions: Patient received therapeutic intervention for dysphagia at [employer]. She did very well, requiring minimal verbal cues to check for pocketing and swallowing bolus before taking another bite.  She had no overt signs/symptoms of aspiration.  Continue with current treatment plan.    FIM:  FIM - Eating Eating Activity: 5: Needs verbal cues/supervision;5: Set-up assist for open containers;5: Set-up assist for cut food  Pain Pain Assessment Pain Assessment: No/denies pain Pain Score:   4 Pain Type: Acute pain Pain Location: Knee Pain Orientation: Left Pain Descriptors: Aching Pain Onset: Progressive Patients Stated Pain Goal: 2 Pain Intervention(s): Medication (See eMAR)  Therapy/Group: Group Therapy  Lenny Pastel 10/03/2012, 1:23 PM

## 2012-10-03 NOTE — Progress Notes (Signed)
Occupational Therapy Note  Patient Details  Name: [name] MRN: [id number] Date of Birth: May 28, 1951 Today's Date: 10/03/2012  [employer] with SLP  [phone] No c/o pain in session   Focus on use of left UE as stabilizer with setup of food tray, safe swallowing precautions- taking small bites, clearing oral pocketing with occasional cuing.   Roney Mans [facility] 10/03/2012, 1:23 PM

## 2012-10-04 ENCOUNTER — Inpatient Hospital Stay (HOSPITAL_COMMUNITY): Payer: BC Managed Care – PPO | Admitting: Occupational Therapy

## 2012-10-04 NOTE — Progress Notes (Signed)
Patient ID: Stephanie Mason, female   DOB: 01/31/1951, 62 y.o.   MRN: [id number] Stephanie Mason is a 62 y.o. 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 Admitted 09/15/2012 to [facility] with left-sided weakness. Patient with noted seizure in the ED and received Ativan as well as loaded with Keppra. MRI of the brain showed large acute partially hemorrhagic right hemispheric infarct superimposed upon remote infarct. Patient did receive TPA. Echocardiogram with normal left ventricular function no source of embolus. EEG showed no seizure activity. Carotid Dopplers with no ICA stenosis. TCD with right terminal ICA stenosis. Patient was transferred to [facility] for ongoing evaluation. Placed on aspirin therapy for stroke prophylaxis as well as subcutaneous Lovenox for DVT prophylaxis. TEE completed 09/22/2012 showing no PFO or thrombus. Maintained on a dysphagia 3 thin liquid diet. Chest x-ray with questionable mass that was ruled out with CT of the chest  Subjective/Complaints: Left arm and leg pain a little better. Was able to sleep well last night.  Objective: Vital Signs: Blood pressure 101/67, pulse 75, temperature 98.4 F (36.9 C), temperature source Oral, resp. rate 18, height 5\' 7"  (1.702 m), weight 71.6 kg (157 lb 13.6 oz), SpO2 94.00%. No results found. No results found for this or any previous visit (from the past 72 hour(s)).   HEENT: Left lower facial droop Cardio: RRR and no extra sounds Resp: CTA B/L and unlabored GI: BS positive and non tender Extremity:  Pulses positive and No Edema Skin:   Intact and Other no pressure areas Neuro: Lethargic, Flat, Cranial Nerve Abnormalities L central VII, Abnormal Sensory Absent to LT, Proprio, Abnormal Motor 0/5 in LUE and LLE, Abnormal FMC Ataxic/ dec FMC, Reflexes: 0, Tone:  Trace to 1 tone left upper extremity and hip, Dysarthric and Inattention Musc/Skel:  Pain with  palpation around deltoid, trap and AC joint left shoulder Gen NAD   Assessment/Plan: 1. Functional deficits secondary to Right MCA infarct with hemorrhage which require 3+ hours per day of interdisciplinary therapy in a comprehensive inpatient rehab setting. Physiatrist is providing close team supervision and 24 hour management of active medical problems listed below. Physiatrist and rehab team continue to assess barriers to discharge/monitor patient progress toward functional and medical goals. FIM: FIM - Bathing Bathing Steps Patient Completed: Chest;Abdomen;Right upper leg;Left upper leg;Right lower leg (including foot);Left Arm (reports RN cleaned peri area and buttocks) Bathing: 4: Min-Patient completes 8-9 75f 10 parts or 75+ percent  FIM - Upper Body Dressing/Undressing Upper body dressing/undressing steps patient completed: Thread/unthread right sleeve of pullover shirt/dresss;Put head through opening of pull over shirt/dress;Pull shirt over trunk Upper body dressing/undressing: 4: Min-Patient completed 75 plus % of tasks FIM - Lower Body Dressing/Undressing Lower body dressing/undressing steps patient completed: Thread/unthread right pants leg;Thread/unthread left pants leg;Pull pants up/down;Don/Doff right shoe;Don/Doff left shoe (wearing diaper) Lower body dressing/undressing: 3: Mod-Patient completed 50-74% of tasks  FIM - Toileting Toileting steps completed by patient: Performs perineal hygiene Toileting Assistive Devices: Grab bar or rail for support Toileting: 2: Max-Patient completed 1 of 3 steps  FIM - Diplomatic Services operational officer Devices: Grab bars Toilet Transfers: 3-To toilet/BSC: Mod A (lift or lower assist);3-From toilet/BSC: Mod A (lift or lower assist)  FIM - Bed/Chair Transfer Bed/Chair Transfer Assistive Devices: Arm rests Bed/Chair Transfer: 3: Supine > Sit: Mod A (lifting assist/Pt. 50-74%/lift 2 legs;2: Bed > Chair or W/C: Max A (lift and  lower assist);2: Sit > Supine: Max A (lifting assist/Pt.  25-49%)  FIM - Locomotion: Wheelchair Distance: 155 Locomotion: Wheelchair: 5: Travels 150 ft or more: maneuvers on rugs and over door sills with supervision, cueing or coaxing FIM - Locomotion: Ambulation Locomotion: Ambulation Assistive Devices: Other (comment) (R handrail) Ambulation/Gait Assistance: Not tested (comment) Locomotion: Ambulation: 0: Activity did not occur  Comprehension Comprehension Mode: Auditory Comprehension: 5-Follows basic conversation/direction: With no assist  Expression Expression Mode: Verbal Expression: 5-Expresses basic needs/ideas: With no assist  Social Interaction Social Interaction Mode: Asleep Social Interaction: 6-Interacts appropriately with others with medication or extra time (anti-anxiety, antidepressant).  Problem Solving Problem Solving Mode: Asleep Problem Solving: 5-Solves basic problems: With no assist  Memory Memory Mode: Asleep Memory: 6-More than reasonable amt of time  Medical Problem List and Plan:  1. Partially hemorrhagic right MCA infarct superimposed upon remote right brain infarct with dense left hemiparesthesias and visual spatial deficits , developing intermittent painful spasms  Zanaflex, trial gabapentin for painful parasthesias. Added muscle rub also to above regimen 2. DVT Prophylaxis/Anticoagulation: Subcutaneous Lovenox. Monitor platelet counts and any signs of bleeding  3. Pain Management: Ultram as needed. Monitor with increased mobility  4. Neuropsych: This patient is not capable of making decisions on his/her own behalf.  5. Seizure disorder. Keppra 500 mg every 12 hours. EEG negative  6. Hypertension. Avapro 150 mg daily. Monitor with increased activity 7.  Dysphagia, pocketing soft textures and staff supervision 8.  Macrocytosis with borderline low RBCs MMA nl, RBC folate nl, admits to 1 beer a day at home-monitor 9.  Poor attention, ritalin trial with  some benefit LOS (Days) 12 A FACE TO FACE EVALUATION WAS PERFORMED  Stephanie Mason 10/04/2012, 7:49 AM

## 2012-10-04 NOTE — Progress Notes (Signed)
Occupational Therapy Session Note  Patient Details  Name: Stephanie Mason MRN: [id number] Date of Birth: 10/10/1950  Today's Date: 10/04/2012 Time: 0800-0845 Time Calculation (min): 45 min (missed 15 min due to c/o pain)  Short Term Goals: Week 2:  OT Short Term Goal 1 (Week 2): UB Dressing:  Min assist with pull over shirt with min instructional cues to follow hemi techniques OT Short Term Goal 2 (Week 2): Dynamic Standing Balance:  Mod assist with standing during LB bath, LB dressing and toileting tasks OT Short Term Goal 3 (Week 2): Toilet Transfer:  Mod assist with stand pivot transfer to and from toilet OT Short Term Goal 4 (Week 2): LUE Management:  Mod vcs for management and positioning of LUE during all BADL tasks, and functional mobility.  OT Short Term Goal 5 (Week 2): Vision:  Patient will locate items/obstacles on her left with no more than mod questioning cues.  Skilled Therapeutic Interventions/Progress Updates:  Patient finishing breakfast with HOB up and NT supervising upon arrival.  Self care retraining to include sponge bath and dressing.  Focused session on sitting balance, bed mobility, attention to left visual field, hemi dressing techniques, and management of LUE with bed mobility and during BADL tasks.  Accommodated LLE pain during BADL to include patient request to bath peri area and buttock and don pants supine and rolling.  Patient declined standing and up in chair due to LLE pain.  Patient made comfortable in bed with bed alarm engaged and all items within reach.  Therapy Documentation Precautions:  Precautions Precautions: Fall Precaution Comments: dense L hemiplegia, left neglect Restrictions Weight Bearing Restrictions: No Pain: 7/10 LLE especially calf, RN aware, medication provided, rest, repositioned, adapted session to accommodate pain. ADL: See FIM for current functional status  Therapy/Group: Individual Therapy  Talah Cookston 10/04/2012, 8:46  AM

## 2012-10-05 ENCOUNTER — Inpatient Hospital Stay (HOSPITAL_COMMUNITY): Payer: BC Managed Care – PPO | Admitting: Occupational Therapy

## 2012-10-05 ENCOUNTER — Inpatient Hospital Stay (HOSPITAL_COMMUNITY): Payer: BC Managed Care – PPO

## 2012-10-05 ENCOUNTER — Inpatient Hospital Stay (HOSPITAL_COMMUNITY): Payer: BC Managed Care – PPO | Admitting: *Deleted

## 2012-10-05 DIAGNOSIS — I69998 Other sequelae following unspecified cerebrovascular disease: Secondary | ICD-10-CM

## 2012-10-05 DIAGNOSIS — G8110 Spastic hemiplegia affecting unspecified side: Secondary | ICD-10-CM

## 2012-10-05 DIAGNOSIS — I6330 Cerebral infarction due to thrombosis of unspecified cerebral artery: Secondary | ICD-10-CM

## 2012-10-05 DIAGNOSIS — R209 Unspecified disturbances of skin sensation: Secondary | ICD-10-CM

## 2012-10-05 MED ORDER — SENNOSIDES-DOCUSATE SODIUM 8.6-50 MG PO TABS
2.0000 | ORAL_TABLET | Freq: Every evening | ORAL | Status: DC | PRN
  Administered 2012-10-05: 2 via ORAL
  Filled 2012-10-05: qty 2

## 2012-10-05 NOTE — Progress Notes (Signed)
Physical Therapy Session Note  Patient Details  Name: Stephanie Mason MRN: [id number] Date of Birth: 05/07/1951  Today's Date: 10/05/2012 Time: 0930-1030 and [id number] Time Calculation (min): 60 min and 30 min  Short Term Goals: Week 2:  PT Short Term Goal 1 (Week 2): Patient will perform squat pivot transfers with mod assist. PT Short Term Goal 2 (Week 2): Patient will perform bed mobility with mod assist. PT Short Term Goal 3 (Week 2): Patient will propel wheelchair 150' with min assist. PT Short Term Goal 4 (Week 2): Patient will perform dynamic sitting activities outside BOS with supervision. PT Short Term Goal 5 (Week 2): Patient will ambulate 61' with LRAD and mod assist.  Skilled Therapeutic Interventions/Progress Updates:    AM Session: Patient received sitting in wheelchair. Today's session focused on wheelchair mobility, sit<>stands, pre-gait activities, and mat mobility.   Patient performed scooting along edge of mat, up and down mat x2 with supervision. Patient demonstrates good technique and attention to proper positioning of L UE and L LE. Patient performed 2 sets x15 reps of sit<>stand from elevated mat with min-mod assist. Patient requires increased assistance with L lateral trunk lean. Patient requires verbal cues to shift weight anteriorly over R knee. Patient performed pre-gait activities with stairs in front of patient for R UE support. Patient performed R LE side stepping x10, requiring max assist for L knee stabilization and midline orientation secondary to L lateral trunk lean. Patient c/o L LE pain and activity discontinued.  Patient instructed in bed mobility compensation techniques (using R LE to scoop L LE and assist with lifting into/out of bed). When using these techniques, patient requires mod assist for sit<>supine.  Patient performed bridges with 3" hold x20 facilitate increased L glut recruitment. Patient performed block practice squat pivot transfers 2 sets  x10 mat<>wheelchair. Patient able to perform these transfers with mod assist, progressing to min assist with verbal cues to lean forward and to the R and shift weight over R knee when transferring to the R (mat>wheelchair). When transferring R, patient cued to use R UE to reach for arm rest. Patient able to clear gluts during all transfers and performs transfer with less assistance when transferring to the L (wheelchair>mat). Physical assist provided for manual facilitation of weight bearing through L LE during transfers. Patient requires verbal cues throughout transfers for proper LE positioning.  Patient returned to room and left seated in wheelchair with seatbelt donned and all needs within reach.  PM Session: Patient received sitting in wheelchair. Today's session focused on wheelchair mobility and pre-gait activities. Patient performed pre-gait activities with use of R handrail for UE support. Patient performed R LE forward, retro, and side stepping x15 each direction. Patient performed L LE forward stepping and demonstrates ability to advance L LE without assistance, however, requires assistance for proper placement. L LE tending to scissor when advancing.  Patient returned to room and left seated in wheelchair with seatbelt donned and all needs within reach.  Therapy Documentation Precautions:  Precautions Precautions: Fall Precaution Comments: dense L hemiplegia, left neglect Restrictions Weight Bearing Restrictions: No Pain: Pain Assessment Pain Assessment: No/denies pain Pain Score: 0-No pain Locomotion : Wheelchair Mobility Wheelchair Mobility: Yes Wheelchair Assistance: 5: Supervision Wheelchair Assistance Details: Verbal cues for sequencing;Verbal cues for technique;Verbal cues for precautions/safety;Visual cues/gestures for precautions/safety Wheelchair Propulsion: Right upper extremity;Right lower extremity Wheelchair Parts Management: Needs assistance Distance: 155 x4  See  FIM for current functional status  Therapy/Group: Individual Therapy  Zella Richer Azam Gervasi  Zella Richer. Rebekah Sprinkle, PT, DPT  10/05/2012, 12:11 PM

## 2012-10-05 NOTE — Progress Notes (Signed)
Speech Language Pathology Daily Session Note  Patient Details  Name: Stephanie Mason MRN: [id number] Date of Birth: 1950/10/05  Today's Date: 10/05/2012 Time: 1130-1200 Time Calculation (min): 30 min  Short Term Goals: Week 2: SLP Short Term Goal 1 (Week 2): Patient will consume Dys.3 textures and thin liquids with supervision vebral cues to utilize safe swallow compensatory strategies. SLP Short Term Goal 2 (Week 2): Patient will attend to left upper extremity and environment with mod assist verbal cues. SLP Short Term Goal 3 (Week 2): Patient will request help as needed with various tasks with mod assist question cues. SLP Short Term Goal 4 (Week 2): Patient will utilize speech intelligibility strategies during verbal epression with min assist verbal and non-verbal cues.  Skilled Therapeutic Interventions: Pt participated in [employer] with focus on dysphagia goals and self-feeding. Pt required supervision visual and question cues to self-monitor and correct pocketing in left oral cavity and Mod I to self-monitor left anterior spillage.  Pt without overt s/s of aspiration throughout the meal .   FIM:  Comprehension Comprehension Mode: Auditory Comprehension: 5-Understands basic 90% of the time/requires cueing < 10% of the time Expression Expression Mode: Verbal Expression: 5-Expresses basic 90% of the time/requires cueing < 10% of the time. Social Interaction Social Interaction: 5-Interacts appropriately 90% of the time - Needs monitoring or encouragement for participation or interaction. Problem Solving Problem Solving: 4-Solves basic 75 - 89% of the time/requires cueing 10 - 24% of the time Memory Memory: 4-Recognizes or recalls 75 - 89% of the time/requires cueing 10 - 24% of the time FIM - Eating Eating Activity: 5: Set-up assist for open containers;5: Supervision/cues;4: Helper occasionally scoops food on utensil  Pain Pain Assessment Pain Assessment: No/denies pain Pain  Score: 0-No pain  Therapy/Group: Group Therapy  Pedro Oldenburg 10/05/2012, 1:20 PM

## 2012-10-05 NOTE — Progress Notes (Signed)
Speech Language Pathology Daily Session Note  Patient Details  Name: Stephanie Mason MRN: [id number] Date of Birth: August 20, 1950  Today's Date: 10/05/2012 Time: 1030-1100 Time Calculation (min): 30 min  Short Term Goals: Week 2: SLP Short Term Goal 1 (Week 2): Patient will consume Dys.3 textures and thin liquids with supervision vebral cues to utilize safe swallow compensatory strategies. SLP Short Term Goal 2 (Week 2): Patient will attend to left upper extremity and environment with mod assist verbal cues. SLP Short Term Goal 3 (Week 2): Patient will request help as needed with various tasks with mod assist question cues. SLP Short Term Goal 4 (Week 2): Patient will utilize speech intelligibility strategies during verbal epression with min assist verbal and non-verbal cues.  Skilled Therapeutic Interventions: Pt communicating with supervision cues to attend to and self-correct speech; oral reading of material from newspaper with functional intelligibility of >95 %.  Recalled 9/10 events/facts from newspaper articles.  Demonstrated awareness/attention to left side, using right hand to lift left arm when moving wheelchair and finding envelope tucked on left side with single verbal cue.   FIM:  Comprehension Comprehension Mode: Auditory Comprehension: 5-Understands basic 90% of the time/requires cueing < 10% of the time Expression Expression Mode: Verbal Expression: 5-Expresses basic 90% of the time/requires cueing < 10% of the time. Social Interaction Social Interaction: 5-Interacts appropriately 90% of the time - Needs monitoring or encouragement for participation or interaction. Problem Solving Problem Solving: 4-Solves basic 75 - 89% of the time/requires cueing 10 - 24% of the time Memory Memory: 4-Recognizes or recalls 75 - 89% of the time/requires cueing 10 - 24% of the time FIM - Eating Eating Activity: 5: Set-up assist for open containers;5: Supervision/cues;4: Helper occasionally  scoops food on utensil  Pain Pain Assessment Pain Assessment: No/denies pain Pain Score: 0-No pain  Therapy/Group: Individual Therapy  Blenda Mounts [name] 10/05/2012, 1:33 PM

## 2012-10-05 NOTE — Plan of Care (Signed)
Problem: RH BOWEL ELIMINATION Goal: RH STG MANAGE BOWEL WITH ASSISTANCE STG Manage Bowel with mod Assistance.  Outcome: Not Progressing No BM since 2/20; given laxative and increased sennokot hs  Problem: RH BLADDER ELIMINATION Goal: RH STG MANAGE BLADDER WITH ASSISTANCE STG Manage Bladder With mod Assistance  Outcome: Not Progressing Requires staff to change brief and keep up with  timed toileting schedule  Problem: RH PAIN MANAGEMENT Goal: RH STG PAIN MANAGED AT OR BELOW PT'S PAIN GOAL Pt will rate pain less than 3 on scale of 0-10 with min assist  Outcome: Not Progressing Pain lft shoulder remains 7-8

## 2012-10-05 NOTE — Progress Notes (Signed)
Occupational Therapy Session Note  Patient Details  Name: Stephanie Mason MRN: [id number] Date of Birth: 1951/07/19  Today's Date: 10/05/2012 Time: 0800-0858 Time Calculation (min): 58 min  Short Term Goals: Week 2:  OT Short Term Goal 1 (Week 2): UB Dressing:  Min assist with pull over shirt with min instructional cues to follow hemi techniques OT Short Term Goal 2 (Week 2): Dynamic Standing Balance:  Mod assist with standing during LB bath, LB dressing and toileting tasks OT Short Term Goal 3 (Week 2): Toilet Transfer:  Mod assist with stand pivot transfer to and from toilet OT Short Term Goal 4 (Week 2): LUE Management:  Mod vcs for management and positioning of LUE during all BADL tasks, and functional mobility.  OT Short Term Goal 5 (Week 2): Vision:  Patient will locate items/obstacles on her left with no more than mod questioning cues.  Skilled Therapeutic Interventions/Progress Updates:  Self care retraining to include sponge bath, dress and groom tasks in w/c at sink to include sit and stand.  Focus session on attention to left visual field and management of LUE during all functional mobility and during BADL tasks, LUE weight bearing during self care tasks in sitting during weight shifts left and forward, hemi dressing techniques, sitting and stand balance.  Today is first day patient does not report pain in LLE at rest; she does report LLE thigh pain with bed mobility.  During standing, patient's LLE tone pulls into adduction and flexion resulting in pelvis and trunk rotation and patient becomes unsafe due to her decreased ability to make an attempt to self correct.    Therapy Documentation Precautions:  Precautions Precautions: Fall Precaution Comments: dense L hemiplegia, left neglect Restrictions Weight Bearing Restrictions: No Pain: Denies pain at rest, occasional LLE pain during activity and functional mobility ADL: See FIM for current functional status  Therapy/Group:  Individual Therapy  Rebecah Dangerfield 10/05/2012, 9:23 AM

## 2012-10-05 NOTE — Progress Notes (Signed)
Patient ID: Stephanie Mason, female   DOB: 03-17-1951, 62 y.o.   MRN: [id number] Stephanie Mason is a 40 y.o. 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 Admitted 09/15/2012 to [facility] with left-sided weakness. Patient with noted seizure in the ED and received Ativan as well as loaded with Keppra. MRI of the brain showed large acute partially hemorrhagic right hemispheric infarct superimposed upon remote infarct. Patient did receive TPA. Echocardiogram with normal left ventricular function no source of embolus. EEG showed no seizure activity. Carotid Dopplers with no ICA stenosis. TCD with right terminal ICA stenosis. Patient was transferred to [facility] for ongoing evaluation. Placed on aspirin therapy for stroke prophylaxis as well as subcutaneous Lovenox for DVT prophylaxis. TEE completed 09/22/2012 showing no PFO or thrombus. Maintained on a dysphagia 3 thin liquid diet. Chest x-ray with questionable mass that was ruled out with CT of the chest  Subjective/Complaints: Left elbow pain, mild, no hand or shoulder pain Objective: Vital Signs: Blood pressure 103/67, pulse 78, temperature 98.1 F (36.7 C), temperature source Oral, resp. rate 16, height 5\' 7"  (1.702 m), weight 71.6 kg (157 lb 13.6 oz), SpO2 94.00%. No results found. No results found for this or any previous visit (from the past 72 hour(s)).   HEENT: Left lower facial droop Cardio: RRR and no extra sounds Resp: CTA B/L and unlabored GI: BS positive and non tender Extremity:  Pulses positive and No Edema Skin:   Intact and Other no pressure areas Neuro: Lethargic, Flat, Cranial Nerve Abnormalities L central VII, Abnormal Sensory Absent to LT, Proprio, Abnormal Motor 0/5 in LUE and LLE, Abnormal FMC Ataxic/ dec FMC, Reflexes: 0, Tone:  Trace to 1 tone left upper extremity and hip, Dysarthric and Inattention Musc/Skel:  Pain with palpation around deltoid, trap  and AC joint left shoulder Gen NAD   Assessment/Plan: 1. Functional deficits secondary to Right MCA infarct with hemorrhage which require 3+ hours per day of interdisciplinary therapy in a comprehensive inpatient rehab setting. Physiatrist is providing close team supervision and 24 hour management of active medical problems listed below. Physiatrist and rehab team continue to assess barriers to discharge/monitor patient progress toward functional and medical goals. FIM: FIM - Bathing Bathing Steps Patient Completed: Chest;Abdomen;Right upper leg;Left upper leg;Right lower leg (including foot);Left Arm;Left lower leg (including foot) Bathing: 3: Mod-Patient completes 5-7 76f 10 parts or 50-74%  FIM - Upper Body Dressing/Undressing Upper body dressing/undressing steps patient completed: Thread/unthread right sleeve of pullover shirt/dresss;Put head through opening of pull over shirt/dress;Pull shirt over trunk Upper body dressing/undressing: 4: Min-Patient completed 75 plus % of tasks FIM - Lower Body Dressing/Undressing Lower body dressing/undressing steps patient completed: Pull pants up/down (wearing diaper) Lower body dressing/undressing: 2: Max-Patient completed 25-49% of tasks  FIM - Toileting Toileting steps completed by patient: Performs perineal hygiene Toileting Assistive Devices: Grab bar or rail for support Toileting: 2: Max-Patient completed 1 of 3 steps  FIM - Diplomatic Services operational officer Devices: Grab bars Toilet Transfers: 3-To toilet/BSC: Mod A (lift or lower assist);3-From toilet/BSC: Mod A (lift or lower assist)  FIM - Bed/Chair Transfer Bed/Chair Transfer Assistive Devices: Arm rests Bed/Chair Transfer: 3: Supine > Sit: Mod A (lifting assist/Pt. 50-74%/lift 2 legs  FIM - Locomotion: Wheelchair Distance: 155 Locomotion: Wheelchair: 5: Travels 150 ft or more: maneuvers on rugs and over door sills with supervision, cueing or coaxing FIM - Locomotion:  Ambulation Locomotion: Ambulation Assistive Devices: Other (comment) (R handrail)  Ambulation/Gait Assistance: Not tested (comment) Locomotion: Ambulation: 0: Activity did not occur  Comprehension Comprehension Mode: Auditory Comprehension: 5-Follows basic conversation/direction: With extra time/assistive device  Expression Expression Mode: Verbal Expression: 4-Expresses basic 75 - 89% of the time/requires cueing 10 - 24% of the time. Needs helper to occlude trach/needs to repeat words.  Social Interaction Social Interaction Mode: Asleep Social Interaction: 4-Interacts appropriately 75 - 89% of the time - Needs redirection for appropriate language or to initiate interaction.  Problem Solving Problem Solving Mode: Asleep Problem Solving: 3-Solves basic 50 - 74% of the time/requires cueing 25 - 49% of the time  Memory Memory Mode: Asleep Memory: 4-Recognizes or recalls 75 - 89% of the time/requires cueing 10 - 24% of the time  Medical Problem List and Plan:  1. Partially hemorrhagic right MCA infarct superimposed upon remote right brain infarct with dense left hemiparesthesias and visual spatial deficits , developing intermittent painful spasms  Zanaflex, trial gabapentin for painful parasthesias. Added muscle rub also to above regimen 2. DVT Prophylaxis/Anticoagulation: Subcutaneous Lovenox. Monitor platelet counts and any signs of bleeding  3. Pain Management: Ultram as needed. Monitor with increased mobility  4. Neuropsych: This patient is not capable of making decisions on his/her own behalf.  5. Seizure disorder. Keppra 500 mg every 12 hours. EEG negative  6. Hypertension. Avapro 150 mg daily. Monitor with increased activity 7.  Dysphagia, pocketing soft textures and staff supervision 8.  Macrocytosis with borderline low RBCs MMA nl, RBC folate nl, admits to 1 beer a day at home-monitor 9.  Poor attention, ritalin trial with some benefit LOS (Days) 13 A FACE TO FACE EVALUATION  WAS PERFORMED  KIRSTEINS,ANDREW E 10/05/2012, 7:48 AM

## 2012-10-06 ENCOUNTER — Inpatient Hospital Stay (HOSPITAL_COMMUNITY): Payer: BC Managed Care – PPO | Admitting: *Deleted

## 2012-10-06 ENCOUNTER — Inpatient Hospital Stay (HOSPITAL_COMMUNITY): Payer: BC Managed Care – PPO | Admitting: Occupational Therapy

## 2012-10-06 NOTE — Progress Notes (Signed)
Occupational Therapy Note  Patient Details  Name: Nareh Matzke MRN: [id number] Date of Birth: 29-Oct-1950 Today's Date: 10/06/2012  [employer] with SLP  [phone] No c/o pain in session   Focus on use of left UE as stabilizer with setup of food tray, visually attend to the left, monitor wiping food on mouth, safe swallowing precautions- taking small bites, clearing oral pocketing with occasional cuing.   Jibri Schriefer 10/06/2012, 1:47 PM

## 2012-10-06 NOTE — Progress Notes (Signed)
Occupational Therapy Session Note  Patient Details  Name: Stephanie Mason MRN: [id number] Date of Birth: 14-Apr-1951  Today's Date: 10/06/2012 Time: 0800-0846 Time Calculation (min): 46 min  Short Term Goals: Week 2:  OT Short Term Goal 1 (Week 2): UB Dressing:  Min assist with pull over shirt with min instructional cues to follow hemi techniques OT Short Term Goal 2 (Week 2): Dynamic Standing Balance:  Mod assist with standing during LB bath, LB dressing and toileting tasks OT Short Term Goal 3 (Week 2): Toilet Transfer:  Mod assist with stand pivot transfer to and from toilet OT Short Term Goal 4 (Week 2): LUE Management:  Mod vcs for management and positioning of LUE during all BADL tasks, and functional mobility.  OT Short Term Goal 5 (Week 2): Vision:  Patient will locate items/obstacles on her left with no more than mod questioning cues.  Skilled Therapeutic Interventions/Progress Updates:    Worked on bathing and dressing session sit to stand at the sink.  Pt still with moderate difficulty following hemi techniques for donning pants and shirt.  Difficulty maintaining visual perceptual pieces to be able to determine if her left hand is going through the right sleeve or if the shirt is backwards for example.  Still needs overall mod assist for sit to stand and standing balance when attempting to wash her peri area or pull her pants over her hips.  Able to locate items for dressing and bathing on the left side of the sink without verbal cueing during session.  Total assist needed to integrate the LUE into any functional task at this time.  Therapy Documentation Precautions:  Precautions Precautions: Fall Precaution Comments: dense L hemiplegia, left neglect Restrictions Weight Bearing Restrictions: No   Pain: Pain Assessment Pain Assessment: 0-10 Pain Score:   2 Pain Type: Acute pain Pain Location: Leg Pain Orientation: Left Pain Intervention(s): Medication (See eMAR) ADL: See  FIM for current functional status  Therapy/Group: Individual Therapy  Anissia Wessells OTR/L 10/06/2012, 10:00 AM

## 2012-10-06 NOTE — Progress Notes (Signed)
Physical Therapy Session Note  Patient Details  Name: Stephanie Mason MRN: [id number] Date of Birth: 27-Oct-1950  Today's Date: 10/06/2012 Time: 1030-1130 and [id number] Time Calculation (min): 60 min and 31 min  Short Term Goals: Week 2:  PT Short Term Goal 1 (Week 2): Patient will perform squat pivot transfers with mod assist. PT Short Term Goal 2 (Week 2): Patient will perform bed mobility with mod assist. PT Short Term Goal 3 (Week 2): Patient will propel wheelchair 150' with min assist. PT Short Term Goal 4 (Week 2): Patient will perform dynamic sitting activities outside BOS with supervision. PT Short Term Goal 5 (Week 2): Patient will ambulate 57' with LRAD and mod assist.  Skilled Therapeutic Interventions/Progress Updates:    AM Session: Patient received sitting in wheelchair. Today's session focused on L LE and trunk NMR, gait training, postural control in static standing, and wheelchair mobility. Patient exercised on NuStep Level 2 x8' with use of B LE and R UE to improve strength, coordination, and NMR of L LE. Manual facilitation provided to assist in L LE extension and flexion as well as to maintain L hip abduction.  Patient instructed in gait training x22' with hemiwalker, see details below. Patient performed 2 rounds of static standing with hemiwalker with emphasis on postural control to improve midline orientation secondary to significant L lateral trunk lean. Patient requires 3 attempts for sit>stand due to increased posterior lean/extension when attempting to stand. Patient requires manual facilitation for L LE weight bearing when transferring sit>stand and during static standing.  Patient completes wheelchair obstacle course x3 in which she is required to weave in and out of 3 cones. Emphasis on attention/scanning to L in order to avoid running over cones. Patient requires min-mod cues for attention to L.  Patient handed off to diner's club seated in wheelchair.  PM  Session: Patient received sitting in wheelchair with daughter, cousin, and boyfriend present. This session focused on wheelchair mobility, pre-gait activities, and NMR for L LE. Patient performed pre-gait activities with use of R handrail for UE support. Patient performed R LE forward and retro stepping x10 each direction to facilitate increased weight bearing through L LE. Patient requires max assist for upright posture, midline orientation, and stabilization of L knee. Patient performed L LE forward and retro stepping and demonstrates ability to advance L LE without assistance, however, requires assistance for proper foot placement and lateral weight shifts. L LE tending to scissor when advancing forward. Ability to advance L LE appears to be coming from small amount of available hip flexion.  Patient performed L LE PNF, see details below.  Patient returned to room and left seated in wheelchair with seatbelt donned and all needs within reach, daughter present.  Therapy Documentation Precautions:  Precautions Precautions: Fall Precaution Comments: dense L hemiplegia, left neglect Restrictions Weight Bearing Restrictions: No Pain: Pain Assessment Pain Assessment: No/denies pain Pain Score: 0-No pain Locomotion : Ambulation Ambulation: Yes Ambulation/Gait Assistance: 1: +1 Total assist Ambulation Distance (Feet): 22 Feet Assistive device: Hemi-walker Ambulation/Gait Assistance Details: Verbal cues for sequencing;Verbal cues for technique;Verbal cues for precautions/safety;Verbal cues for gait pattern;Verbal cues for safe use of DME/AE;Tactile cues for posture;Manual facilitation for weight shifting;Manual facilitation for placement;Manual facilitation for weight bearing Ambulation/Gait Assistance Details: Patient with L DF ace wrap for gait training. Patient requires assistance for upright posture, midline orientation, L knee stabilization during stance phase, proper placement of L foot, and  lateral weight shifts. Gait Gait: Yes Gait Pattern: Step-to pattern;Decreased step length -  right;Decreased step length - left;Decreased stance time - left;Decreased stride length;Decreased hip/knee flexion - left;Decreased dorsiflexion - left;Narrow base of support;Trunk flexed;Decreased weight shift to left;Lateral trunk lean to left Wheelchair Mobility Wheelchair Mobility: Yes Wheelchair Assistance: 5: Supervision Wheelchair Assistance Details: Verbal cues for precautions/safety Wheelchair Propulsion: Right upper extremity;Right lower extremity Wheelchair Parts Management: Needs assistance Distance: 155 x3 Other Treatments: Treatments Neuromuscular Facilitation: Left;Lower Extremity;Activity to increase coordination;Activity to increase motor control;Activity to increase timing and sequencing;Activity to increase sustained activation (PNF D1 Flexion/Extension; requires max assist for flexion pattern, but is able to tolerate mod-max resistance for D1 extension)  See FIM for current functional status  Therapy/Group: Individual Therapy  Chipper Herb. Demetruis Depaul, PT, DPT  10/06/2012, 4:23 PM

## 2012-10-06 NOTE — Progress Notes (Signed)
Speech Language Pathology Daily Session Note  Patient Details  Name: Stephanie Mason MRN: [id number] Date of Birth: [birth year]  Today's Date: 10/06/2012 Time: 1130-1145 Time Calculation (min): 15 min  Short Term Goals: Week 2: SLP Short Term Goal 1 (Week 2): Patient will consume Dys.3 textures and thin liquids with supervision vebral cues to utilize safe swallow compensatory strategies. SLP Short Term Goal 2 (Week 2): Patient will attend to left upper extremity and environment with mod assist verbal cues. SLP Short Term Goal 3 (Week 2): Patient will request help as needed with various tasks with mod assist question cues. SLP Short Term Goal 4 (Week 2): Patient will utilize speech intelligibility strategies during verbal epression with min assist verbal and non-verbal cues.  Skilled Therapeutic Interventions: Pt participated in [employer] with focus on dysphagia goals and self-feeding. Pt required Min A verbal cues for small bites and Min A visual cues to self-monitor and correct pocketing in left oral cavity. Pt was also Mod I to self-monitor left anterior spillage. Pt without overt s/s of aspiration throughout the meal.    FIM:  Comprehension Comprehension Mode: Auditory Comprehension: 5-Follows basic conversation/direction: With extra time/assistive device Expression Expression Mode: Verbal Expression: 5-Expresses basic 90% of the time/requires cueing < 10% of the time. Social Interaction Social Interaction: 4-Interacts appropriately 75 - 89% of the time - Needs redirection for appropriate language or to initiate interaction. Problem Solving Problem Solving: 4-Solves basic 75 - 89% of the time/requires cueing 10 - 24% of the time Memory Memory: 4-Recognizes or recalls 75 - 89% of the time/requires cueing 10 - 24% of the time FIM - Eating Eating Activity: 5: Supervision/cues;5: Set-up assist for open containers  Pain Pain Assessment Pain Assessment: No/denies pain Pain Score:  0-No pain  Therapy/Group: Group Therapy  Kelina Beauchamp 10/06/2012, 3:16 PM

## 2012-10-06 NOTE — Progress Notes (Signed)
[name] ID: Stephanie Mason, female   DOB: 08-Jan-1951, 62 y.o.   MRN: [id number] Stephanie Mason is a 6 y.o. 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 Admitted 09/15/2012 to [facility] with left-sided weakness. Patient with noted seizure in the ED and received Ativan as well as loaded with Keppra. MRI of the brain showed large acute partially hemorrhagic right hemispheric infarct superimposed upon remote infarct. Patient did receive TPA. Echocardiogram with normal left ventricular function no source of embolus. EEG showed no seizure activity. Carotid Dopplers with no ICA stenosis. TCD with right terminal ICA stenosis. Patient was transferred to [facility] for ongoing evaluation. Placed on aspirin therapy for stroke prophylaxis as well as subcutaneous Lovenox for DVT prophylaxis. TEE completed 09/22/2012 showing no PFO or thrombus. Maintained on a dysphagia 3 thin liquid diet. Chest x-ray with questionable mass that was ruled out with CT of the chest  Subjective/Complaints: Bathing with OT, reports taking steps with PT Objective: Vital Signs: Blood pressure 108/72, pulse 77, temperature 98.3 F (36.8 C), temperature source Oral, resp. rate 18, height 5\' 7"  (1.702 m), weight 71.6 kg (157 lb 13.6 oz), SpO2 93.00%. No results found. Results for orders placed during the hospital encounter of 09/22/12 (from the past 72 hour(s))  CREATININE, SERUM     Status: None   Collection Time    10/06/12  5:55 AM      Result Value Range   Creatinine, Ser 0.72  0.50 - 1.10 mg/dL   GFR calc non Af Amer >90  >90 mL/min   GFR calc Af Amer >90  >90 mL/min   Comment:            The eGFR has been calculated     using the CKD EPI equation.     This calculation has not been     validated in all clinical     situations.     eGFR's persistently     <90 mL/min signify     possible Chronic Kidney Disease.     HEENT: Left lower facial  droop Cardio: RRR and no extra sounds Resp: CTA B/L and unlabored GI: BS positive and non tender Extremity:  Pulses positive and No Edema Skin:   Intact and Other no pressure areas Neuro: Lethargic, Flat, Cranial Nerve Abnormalities L central VII, Abnormal Sensory Absent to LT, Proprio, Abnormal Motor 0/5 in LUE and LLE, Abnormal FMC Ataxic/ dec FMC, Reflexes: 0, Tone:  Trace to 1 tone left upper extremity and hip, Dysarthric and Inattention Musc/Skel:  Pain with palpation around deltoid, trap and AC joint left shoulder Gen NAD   Assessment/Plan: 1. Functional deficits secondary to Right MCA infarct with hemorrhage which require 3+ hours per day of interdisciplinary therapy in a comprehensive inpatient rehab setting. Physiatrist is providing close team supervision and 24 hour management of active medical problems listed below. Physiatrist and rehab team continue to assess barriers to discharge/monitor patient progress toward functional and medical goals. FIM: FIM - Bathing Bathing Steps Patient Completed: Chest;Abdomen;Right upper leg;Left upper leg;Right lower leg (including foot);Left Arm;Left lower leg (including foot);Front perineal area Bathing: 4: Min-Patient completes 8-9 57f 10 parts or 75+ percent  FIM - Upper Body Dressing/Undressing Upper body dressing/undressing steps patient completed: Thread/unthread right sleeve of pullover shirt/dresss;Put head through opening of pull over shirt/dress;Pull shirt over trunk Upper body dressing/undressing: 4: Min-Patient completed 75 plus % of tasks FIM - Lower Body Dressing/Undressing Lower body dressing/undressing steps patient completed:  Thread/unthread right pants leg;Thread/unthread left pants leg;Don/Doff right shoe;Don/Doff left shoe;Fasten/unfasten right shoe;Fasten/unfasten left shoe (wearing diaper) Lower body dressing/undressing: 3: Mod-Patient completed 50-74% of tasks  FIM - Toileting Toileting steps completed by patient: Adjust  clothing prior to toileting;Performs perineal hygiene;Adjust clothing after toileting Toileting Assistive Devices: Grab bar or rail for support Toileting: 3: Mod-Patient completed 2 of 3 steps (pt pulls up rt side; staff pull up lft side due 2 paresis)  FIM - Diplomatic Services operational officer Devices: Grab bars Toilet Transfers: 3-To toilet/BSC: Mod A (lift or lower assist);3-From toilet/BSC: Mod A (lift or lower assist)  FIM - Bed/Chair Transfer Bed/Chair Transfer Assistive Devices: Arm rests Bed/Chair Transfer: 3: Bed > Chair or W/C: Mod A (lift or lower assist);4: Supine > Sit: Min A (steadying Pt. > 75%/lift 1 leg)  FIM - Locomotion: Wheelchair Distance: 155 Locomotion: Wheelchair: 5: Travels 150 ft or more: maneuvers on rugs and over door sills with supervision, cueing or coaxing FIM - Locomotion: Ambulation Locomotion: Ambulation Assistive Devices: Other (comment) (R handrail) Ambulation/Gait Assistance: Not tested (comment) Locomotion: Ambulation: 0: Activity did not occur  Comprehension Comprehension Mode: Auditory Comprehension: 5-Follows basic conversation/direction: With extra time/assistive device  Expression Expression Mode: Verbal Expression: 4-Expresses basic 75 - 89% of the time/requires cueing 10 - 24% of the time. Needs helper to occlude trach/needs to repeat words.  Social Interaction Social Interaction Mode: Asleep Social Interaction: 4-Interacts appropriately 75 - 89% of the time - Needs redirection for appropriate language or to initiate interaction.  Problem Solving Problem Solving Mode: Asleep Problem Solving: 3-Solves basic 50 - 74% of the time/requires cueing 25 - 49% of the time  Memory Memory Mode: Asleep Memory: 4-Recognizes or recalls 75 - 89% of the time/requires cueing 10 - 24% of the time  Medical Problem List and Plan:  1. Partially hemorrhagic right MCA infarct superimposed upon remote right brain infarct with dense left  hemiparesthesias and visual spatial deficits , developing intermittent painful spasms  Zanaflex, trial gabapentin for painful parasthesias. Added muscle rub also to above regimen 2. DVT Prophylaxis/Anticoagulation: Subcutaneous Lovenox. Monitor platelet counts and any signs of bleeding  3. Pain Management: Ultram as needed. Monitor with increased mobility  4. Neuropsych: This patient is not capable of making decisions on his/her own behalf.  5. Seizure disorder. Keppra 500 mg every 12 hours. EEG negative  6. Hypertension. Avapro 150 mg daily. Monitor with increased activity 7.  Dysphagia, pocketing soft textures and staff supervision 8.  Macrocytosis with borderline low RBCs MMA nl, RBC folate nl, admits to 1 beer a day at home-monitor 9.  Poor attention, ritalin trial with some benefit, consider increase dose, will discuss with team LOS (Days) 14 A FACE TO FACE EVALUATION WAS PERFORMED  Henrick Mcgue E 10/06/2012, 8:00 AM

## 2012-10-07 ENCOUNTER — Inpatient Hospital Stay (HOSPITAL_COMMUNITY): Payer: BC Managed Care – PPO | Admitting: Occupational Therapy

## 2012-10-07 ENCOUNTER — Inpatient Hospital Stay (HOSPITAL_COMMUNITY): Payer: BC Managed Care – PPO | Admitting: Speech Pathology

## 2012-10-07 ENCOUNTER — Inpatient Hospital Stay (HOSPITAL_COMMUNITY): Payer: BC Managed Care – PPO | Admitting: *Deleted

## 2012-10-07 MED ORDER — TIZANIDINE HCL 4 MG PO TABS
4.0000 mg | ORAL_TABLET | Freq: Three times a day (TID) | ORAL | Status: DC
  Administered 2012-10-07 – 2012-10-14 (×21): 4 mg via ORAL
  Filled 2012-10-07 (×24): qty 1

## 2012-10-07 NOTE — Progress Notes (Signed)
Orthopedic Tech Progress Note Patient Details:  Stephanie Mason 05-24-1951 [id number]  Ortho Devices Type of Ortho Device: Ankle Air splint Ortho Device/Splint Location: LEFT AIR CAST Ortho Device/Splint Interventions: Application   Stephanie Mason, Stephanie Mason 10/07/2012, 11:17 AM

## 2012-10-07 NOTE — Progress Notes (Signed)
Social Work Patient ID: [name], female   DOB: 08/06/1951, 62 y.o.   MRN: [id number] Met with pt and daughter who pt made come here at 1;30pm to meet with rehab team.  Informed daughter that she did not need to be here at this time, that team conference  Was this am and this worker would have contacted her or she could have come up after work to hear update.  Pt states: "I wanted her here."  Discussed pt's progress And if the plan is NHP at discharge form here.  Daughter reports she plans to take a FMLA to provide care to pt at discharge.  Informed her would need her to come in for Family education closer to discharge and learn pt's care and while she is here to attend her last therapy with her-PT.  Have given daughter my card to check with me next time pt tells her She needs to be here at a certain time.  Will work on discharge plans.  Pt reports she is doing better and feels better.  Daughter states: " She is a mess, which tells me she is getting better."

## 2012-10-07 NOTE — Patient Care Conference (Signed)
Inpatient RehabilitationTeam Conference and Plan of Care Update Date: 10/07/2012   Time: 11:30 AM    Patient Name: Stephanie Mason      Medical Record Number: [id number]  Date of Birth: [birth year] Sex: Female         Room/Bed: 4034/4034-01 Payor Info: Payor: BLUE CROSS BLUE SHIELD  Plan: BCBS PPO OUT OF STATE  Product Type: *No Product type*     Admitting Diagnosis: rt cva  Admit Date/Time:  09/22/2012  4:07 PM Admission Comments: No comment available   Primary Diagnosis:  CVA (cerebral infarction) Principal Problem: CVA (cerebral infarction)  Patient Active Problem List   Diagnosis Date Noted  . CVA (cerebral infarction) 09/22/2012  . 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 Hemiplegia, unspecified, affecting nondominant side 09/15/2012  . Cerebral embolism with cerebral infarction 09/15/2012  . Other convulsions 09/15/2012    Expected Discharge Date: Expected Discharge Date: 10/20/12  Team Members Present: Physician leading conference: Dr. Claudette Laws Social Worker Present: Dossie Der, LCSW Nurse Present: Rosalio Macadamia, RN PT Present: Edman Circle, PT;Caroline Adriana Simas, PT;Other (comment) Clarisse Gouge ripa-PT) OT Present: Leonette Monarch, Felipa Eth, OT SLP Present: [name], SLP Other (Discipline and Name): Charolette Child Coordinator     Current Status/Progress Goal Weekly Team Focus  Medical   Left ankle pain and swelling, minimal neurologic recovery  of strength  Manage ankle pain, continue medication management of attention  Aircast for left ankle, continue Ritalin, add Celexa   Bowel/Bladder   Cont of bowel and bladder  continent with mod assist  timed toileting and medication   Swallow/Nutrition/ Hydration   Dys.3 textures and thin liquids with full supervision   least restrictive p.o. intake   trials of regualr textures   ADL's   Min assist bath and UB dressing, mod-max LB dressing, mod with toileting and toilet transfers  min assist with bathing, LB dressing,  toileting, toilet transfers, tub bench transfers; supervision UB dressing,  attention to left visual field, management of LUE during all functional mobility and BADL, dynamic sitting and standing balance, safe transfers, postural control in sit and stand,  NMR to left side   Mobility   mod A bed mobility from flat surface, min-mod for sit<>stand, min-mod for squat pivot transfers when weight bearing through L LE provided, max A gait x25' with R handrail, max-total for 1 step with R handrail  S-min A overall  bed mobility, transfers, gait training, pre-gait activities, weight bearing activities for L LE and L UE, postural control in standing, NMR L LE and trunk, standing balance, w/c mobility, activity tolerance   Communication   supervision-min assist  supervision  increase self monitoring and correcting   Safety/Cognition/ Behavioral Observations  min-mod assist   min assist   incrase left attention, self monitoring and correcting   Pain   left shoulder pain/   <3  monitor effectiveness of medication   Skin   n/a  n/a  n/a      *See Interdisciplinary Assessment and Plan and progress notes for long and short-term goals  Barriers to Discharge:  will need 24 7 caregiver    Possible Resolutions to Barriers:  Caregiver training    Discharge Planning/Teaching Needs:  Possible plan is for NHP upon discharge due to no caregiver-want her to go to [employer] where she and boyfriend work.  WIll get insurance approval Daughter trying now to take a fmla     Team Discussion: Slow progress, ankle aircast for support-due to pain.  Increased zanaflex for  muscle tightness Improved continence  Revisions to Treatment Plan:  Daughter to take FMLA to provide care to pt at discharge   Continued Need for Acute Rehabilitation Level of Care: The patient requires daily medical management by a physician with specialized training in physical medicine and rehabilitation for the following  conditions: Daily direction of a multidisciplinary physical rehabilitation program to ensure safe treatment while eliciting the highest outcome that is of practical value to the patient.: Yes Daily medical management of patient stability for increased activity during participation in an intensive rehabilitation regime.: Yes Daily analysis of laboratory values and/or radiology reports with any subsequent need for medication adjustment of medical intervention for : Neurological problems;Other  Malvika Tung, Lemar Livings 10/08/2012, 11:22 AM

## 2012-10-07 NOTE — Progress Notes (Signed)
[name] ID: Stephanie Mason, female   DOB: [birth year], 62 y.o.   MRN: [id number] Stephanie Mason is a 74 y.o. 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 Admitted 09/15/2012 to [facility] with left-sided weakness. Patient with noted seizure in the ED and received Ativan as well as loaded with Keppra. MRI of the brain showed large acute partially hemorrhagic right hemispheric infarct superimposed upon remote infarct. Patient did receive TPA. Echocardiogram with normal left ventricular function no source of embolus. EEG showed no seizure activity. Carotid Dopplers with no ICA stenosis. TCD with right terminal ICA stenosis. Patient was transferred to [facility] for ongoing evaluation. Placed on aspirin therapy for stroke prophylaxis as well as subcutaneous Lovenox for DVT prophylaxis. TEE completed 09/22/2012 showing no PFO or thrombus. Maintained on a dysphagia 3 thin liquid diet. Chest x-ray with questionable mass that was ruled out with CT of the chest  Subjective/Complaints: Left ankle pain, pt doesn't remember twisting it, no falls, hx of remote ankle sprain playing basketball Objective: Vital Signs: Blood pressure 96/65, pulse 69, temperature 97.5 F (36.4 C), temperature source Oral, resp. rate 18, height 5\' 7"  (1.702 m), weight 71.6 kg (157 lb 13.6 oz), SpO2 93.00%. No results found. Results for orders placed during the hospital encounter of 09/22/12 (from the past 72 hour(s))  CREATININE, SERUM     Status: None   Collection Time    10/06/12  5:55 AM      Result Value Range   Creatinine, Ser 0.72  0.50 - 1.10 mg/dL   GFR calc non Af Amer >90  >90 mL/min   GFR calc Af Amer >90  >90 mL/min   Comment:            The eGFR has been calculated     using the CKD EPI equation.     This calculation has not been     validated in all clinical     situations.     eGFR's persistently     <90 mL/min signify     possible  Chronic Kidney Disease.     HEENT: Left lower facial droop Cardio: RRR and no extra sounds Resp: CTA B/L and unlabored GI: BS positive and non tender Extremity:  Pulses positive and No Edema Skin:   Intact and Other no pressure areas Neuro: Lethargic, Flat, Cranial Nerve Abnormalities L central VII, Abnormal Sensory Absent to LT, Proprio, Abnormal Motor 0/5 in LUE and LLE, Abnormal FMC Ataxic/ dec FMC, Reflexes: 0, Tone:  Trace to 1 tone left upper extremity and hip, Dysarthric and Inattention Musc/Skel:  Pain with palpation around deltoid, trap and AC joint left shoulder Gen NAD   Assessment/Plan: 1. Functional deficits secondary to Right MCA infarct with hemorrhage which require 3+ hours per day of interdisciplinary therapy in a comprehensive inpatient rehab setting. Physiatrist is providing close team supervision and 24 hour management of active medical problems listed below. Physiatrist and rehab team continue to assess barriers to discharge/monitor patient progress toward functional and medical goals. FIM: FIM - Bathing Bathing Steps Patient Completed: Chest;Left Arm;Abdomen;Right upper leg;Left upper leg;Right lower leg (including foot);Left lower leg (including foot) Bathing: 3: Mod-Patient completes 5-7 78f 10 parts or 50-74%  FIM - Upper Body Dressing/Undressing Upper body dressing/undressing steps patient completed: Thread/unthread right sleeve of pullover shirt/dresss;Put head through opening of pull over shirt/dress;Pull shirt over trunk Upper body dressing/undressing: 4: Min-Patient completed 75 plus % of tasks FIM - Lower Body Dressing/Undressing  Lower body dressing/undressing steps patient completed: Thread/unthread right underwear leg;Thread/unthread right pants leg;Don/Doff right shoe Lower body dressing/undressing: 2: Max-Patient completed 25-49% of tasks  FIM - Toileting Toileting steps completed by patient: Adjust clothing prior to toileting;Performs perineal  hygiene;Adjust clothing after toileting Toileting Assistive Devices: Grab bar or rail for support Toileting: 3: Mod-Patient completed 2 of 3 steps (pt pulls up rt side; staff pull up lft side due 2 paresis)  FIM - Diplomatic Services operational officer Devices: Grab bars Toilet Transfers: 3-To toilet/BSC: Mod A (lift or lower assist);3-From toilet/BSC: Mod A (lift or lower assist)  FIM - Press photographer Assistive Devices: Arm rests Bed/Chair Transfer: 3: Bed > Chair or W/C: Mod A (lift or lower assist);3: Chair or W/C > Bed: Mod A (lift or lower assist)  FIM - Locomotion: Wheelchair Distance: 155 Locomotion: Wheelchair: 5: Travels 150 ft or more: maneuvers on rugs and over door sills with supervision, cueing or coaxing FIM - Locomotion: Ambulation Locomotion: Ambulation Assistive Devices: Chief Operating Officer Ambulation/Gait Assistance: 1: +1 Total assist Locomotion: Ambulation: 1: Travels less than 50 ft with total assistance/helper does all (Pt.<25%)  Comprehension Comprehension Mode: Auditory Comprehension: 5-Follows basic conversation/direction: With extra time/assistive device  Expression Expression Mode: Verbal Expression: 5-Expresses basic 90% of the time/requires cueing < 10% of the time.  Social Interaction Social Interaction Mode: Asleep Social Interaction: 4-Interacts appropriately 75 - 89% of the time - Needs redirection for appropriate language or to initiate interaction.  Problem Solving Problem Solving Mode: Not assessed Problem Solving: 4-Solves basic 75 - 89% of the time/requires cueing 10 - 24% of the time  Memory Memory Mode: Asleep Memory: 4-Recognizes or recalls 75 - 89% of the time/requires cueing 10 - 24% of the time  Medical Problem List and Plan:  1. Partially hemorrhagic right MCA infarct superimposed upon remote right brain infarct with dense left hemiparesthesias and visual spatial deficits , developing intermittent painful  spasms  Zanaflex, trial gabapentin for painful parasthesias. Added muscle rub also to above regimen 2. DVT Prophylaxis/Anticoagulation: Subcutaneous Lovenox. Monitor platelet counts and any signs of bleeding  3. Pain Management: Ultram as needed. Left ankle sprain, Ice and air cast 4. Neuropsych: This patient is not capable of making decisions on his/her own behalf.  5. Seizure disorder. Keppra 500 mg every 12 hours. EEG negative  6. Hypertension. Avapro 150 mg daily. Monitor with increased activity 7.  Dysphagia, pocketing soft textures and staff supervision 8.  Macrocytosis with borderline low RBCs MMA nl, RBC folate nl, admits to 1 beer a day at home-monitor 9.  Poor attention, ritalin trial with some benefit, consider increase dose, will discuss with team LOS (Days) 15 A FACE TO FACE EVALUATION WAS PERFORMED  Landrum Carbonell E 10/07/2012, 9:16 AM

## 2012-10-07 NOTE — Progress Notes (Signed)
Occupational Therapy Note  Patient Details  Name: Stephanie Mason MRN: [id number] Date of Birth: 1951/07/06 Today's Date: 10/07/2012  [employer] 1145-12 No c/o pain Self feeding group with SLP with focus on using left UE as stabilizer with setup of tray, visual attention to left, monitoring spillage and oral hygiene with eating, safe swallowing precautions with small bites and clearing oral pocketing.    Roney Mans [facility] 10/07/2012, 1:27 PM

## 2012-10-07 NOTE — Progress Notes (Signed)
Speech Language Pathology Daily Session Note  Patient Details  Name: Stephanie Mason MRN: [id number] Date of Birth: 1950-10-05  Today's Date: 10/07/2012  Session 1 Time: 1130-1145 Time Calculation: 15 min  Session 2 Time: 1430-1510 Time Calculation (min): 40 min  Short Term Goals: Week 2: SLP Short Term Goal 1 (Week 2): Patient will consume Dys.3 textures and thin liquids with supervision vebral cues to utilize safe swallow compensatory strategies. SLP Short Term Goal 2 (Week 2): Patient will attend to left upper extremity and environment with mod assist verbal cues. SLP Short Term Goal 3 (Week 2): Patient will request help as needed with various tasks with mod assist question cues. SLP Short Term Goal 4 (Week 2): Patient will utilize speech intelligibility strategies during verbal epression with min assist verbal and non-verbal cues.  Skilled Therapeutic Interventions:  Session 1: Pt participated in [employer] with treatment focus on dysphagia goals and self-feeding. Pt required supervision visual cues for small bites and attended to left buccal pocketing with Mod I. Pt without overt s/s of aspiration throughout the meal.   Session 2: Treatment focus on dysphagia goals. Pt consumed trials of thin liquids via straw without overt s/s of aspiration. Pt also consumed regular textures and demonstrated efficient mastication and monitored left buccal pocketing with Mod I. Recommend pt's diet is upgraded tomorrow to regular textures for [employer].  Pt and her daughter educated on current recommendations and verbalized understanding.    FIM:  Comprehension Comprehension Mode: Auditory Comprehension: 5-Follows basic conversation/direction: With extra time/assistive device Expression Expression Mode: Verbal Expression: 5-Expresses basic 90% of the time/requires cueing < 10% of the time. Social Interaction Social Interaction: 5-Interacts appropriately 90% of the time - Needs monitoring or  encouragement for participation or interaction. Problem Solving Problem Solving: 4-Solves basic 75 - 89% of the time/requires cueing 10 - 24% of the time Memory Memory: 4-Recognizes or recalls 75 - 89% of the time/requires cueing 10 - 24% of the time FIM - Eating Eating Activity: 5: Supervision/cues  Pain Pain Assessment Pain Assessment: No/denies pain  Therapy/Group: Individual Therapy and Group Therapy  Stephanie Mason 10/07/2012, 3:36 PM

## 2012-10-07 NOTE — Progress Notes (Signed)
Physical Therapy Session Note  Patient Details  Name: Stephanie Mason MRN: [id number] Date of Birth: [birth year]  Today's Date: 10/07/2012 Time: 0928-1025 and Time Calculation (min): 57 min and  Short Term Goals: Week 2:  PT Short Term Goal 1 (Week 2): Patient will perform squat pivot transfers with mod assist. PT Short Term Goal 2 (Week 2): Patient will perform bed mobility with mod assist. PT Short Term Goal 3 (Week 2): Patient will propel wheelchair 150' with min assist. PT Short Term Goal 4 (Week 2): Patient will perform dynamic sitting activities outside BOS with supervision. PT Short Term Goal 5 (Week 2): Patient will ambulate 72' with LRAD and mod assist.  Skilled Therapeutic Interventions/Progress Updates:    AM Session: Patient received sitting in wheelchair. This session focused on wheelchair mobility, postural control in static standing and dynamic sitting, and stretching/tone management of L LE. Patient performed static standing (without UE support) in front of mirror for visual feedback to assist with correction of posture. Patient performed 5 rounds of standing, lasting from approximately 20" to 60". Patient demonstrates significant L lateral trunk lean and forward flexed posture and requires mod-max assist for L knee stabilization, midline orientation, and upright posture. Patient demonstrates increased pushing through R LE, increasing L lateral trunk lean.  Patient with c/o L LE pain during standing activities, so patient transferred to supine on mat. Stretching/tone management of L LE performed, stretching hamstrings, adductors, hip IR/ER for 20-30" each. Patient with grimacing and reports pain with stretching in each of these directions. Performed passive lumbar rotations in hooklying to assist with pain complaints.  Patient performed dynamic sitting balance activities, reaching for horseshoes on elevated basketball rim positioned to patient's R and patient instructed to place  horseshoes on stool positioned anterior and to the L or on the floor and to the L. Activity emphasized L lateral extension and attention to and weight shifts anteriorly and to the L. Patient requires stand by assistance when reaching to the R, but requires mod-max assist when reaching to the L, especially reaching to the floor and to the L.  Patient performed block practice x6 squat pivot transfers wheelchair<>elevated mat (going L and R) with min-mod assist (min going R, mod going L) and requires manual facilitation for weight bearing through L LE.  Patient returned to room and left seated in wheelchair with seatbelt donned and all needs within reach.  Therapy Documentation Precautions:  Precautions Precautions: Fall Precaution Comments: dense L hemiplegia, left neglect Restrictions Weight Bearing Restrictions: No Pain: Pain Assessment Pain Assessment: No/denies pain Pain Score: 0-No pain Locomotion : Wheelchair Mobility Wheelchair Mobility: Yes Wheelchair Assistance: 5: Financial planner Details: Verbal cues for Engineer, drilling: Right upper extremity;Right lower extremity Wheelchair Parts Management: Needs assistance Distance: 155  Balance: Balance Balance Assessed: Yes Dynamic Sitting Balance Dynamic Sitting - Balance Support: Feet supported;No upper extremity supported Dynamic Sitting - Level of Assistance: 2: Max assist;3: Mod assist;4: Min assist;5: Stand by assistance Dynamic Sitting Balance - Compensations: Patient demonstrates increased pushing through R LE during dynamic sitting reaching tasks, causing LOB to the L several times. Dynamic Sitting - Balance Activities: Lateral lean/weight shifting;Forward lean/weight shifting;Reaching for objects;Reaching across midline Static Standing Balance Static Standing - Balance Support: No upper extremity supported;During functional activity Static Standing - Level of Assistance: 2: Max  assist;3: Mod assist  See FIM for current functional status  Therapy/Group: Individual Therapy  Chipper Herb. Council Munguia, PT, DPT  10/07/2012, 10:52 AM

## 2012-10-07 NOTE — Progress Notes (Signed)
Occupational Therapy Session Note  Patient Details  Name: Stephanie Mason MRN: [id number] Date of Birth: 1951/07/01  Today's Date: 10/07/2012 Time: 0800-0900 Time Calculation (min): 60 min  Short Term Goals: Week 2:  OT Short Term Goal 1 (Week 2): UB Dressing:  Min assist with pull over shirt with min instructional cues to follow hemi techniques OT Short Term Goal 2 (Week 2): Dynamic Standing Balance:  Mod assist with standing during LB bath, LB dressing and toileting tasks OT Short Term Goal 3 (Week 2): Toilet Transfer:  Mod assist with stand pivot transfer to and from toilet OT Short Term Goal 4 (Week 2): LUE Management:  Mod vcs for management and positioning of LUE during all BADL tasks, and functional mobility.  OT Short Term Goal 5 (Week 2): Vision:  Patient will locate items/obstacles on her left with no more than mod questioning cues.  Skilled Therapeutic Interventions/Progress Updates:  Self care retraining to include sponge bath (declined shower), dress, toilet transfer, toileting and groom.  Focus session on postural control in sit and in stand,  dynamic standing balance, maintain forward weight shift during squat pivot transfers, with scooting and with sit><stand, placement of LLE before stand and during transfers, attention to left visual field, management of LUE during all functional mobility and during BADL, hemi techniques.  Patient requires mod verbal and facilitation cues for placement of LLE before transfers, before stand and during standing - ankle edema noted with patient reporting ankle tenderness.  Minimal left shoulder horiz.adduction noted during UB bath.  Weight bearing through LLE and LUE while seated during all anterior and left weight shifts while bathing and dressing.   Therapy Documentation Precautions:  Precautions Precautions: Fall Precaution Comments: dense L hemiplegia, left neglect Restrictions Weight Bearing Restrictions: No Pain: Pain Assessment Pain  Assessment: No/denies pain Pain Score: 0-No pain ADL: See FIM for current functional status  Therapy/Group: Individual Therapy  Stephanie Mason 10/07/2012, 1:06 PM

## 2012-10-08 ENCOUNTER — Inpatient Hospital Stay (HOSPITAL_COMMUNITY): Payer: BC Managed Care – PPO | Admitting: Occupational Therapy

## 2012-10-08 ENCOUNTER — Inpatient Hospital Stay (HOSPITAL_COMMUNITY): Payer: BC Managed Care – PPO | Admitting: Speech Pathology

## 2012-10-08 ENCOUNTER — Inpatient Hospital Stay (HOSPITAL_COMMUNITY): Payer: BC Managed Care – PPO | Admitting: *Deleted

## 2012-10-08 DIAGNOSIS — I69998 Other sequelae following unspecified cerebrovascular disease: Secondary | ICD-10-CM

## 2012-10-08 DIAGNOSIS — I6330 Cerebral infarction due to thrombosis of unspecified cerebral artery: Secondary | ICD-10-CM

## 2012-10-08 DIAGNOSIS — R209 Unspecified disturbances of skin sensation: Secondary | ICD-10-CM

## 2012-10-08 DIAGNOSIS — G8110 Spastic hemiplegia affecting unspecified side: Secondary | ICD-10-CM

## 2012-10-08 MED ORDER — SENNOSIDES-DOCUSATE SODIUM 8.6-50 MG PO TABS
2.0000 | ORAL_TABLET | Freq: Two times a day (BID) | ORAL | Status: DC
  Administered 2012-10-08 – 2012-10-18 (×16): 2 via ORAL
  Filled 2012-10-08 (×19): qty 2

## 2012-10-08 MED ORDER — METHYLPHENIDATE HCL 5 MG PO TABS
10.0000 mg | ORAL_TABLET | Freq: Two times a day (BID) | ORAL | Status: DC
  Administered 2012-10-08 – 2012-10-19 (×23): 10 mg via ORAL
  Filled 2012-10-08 (×11): qty 2
  Filled 2012-10-08: qty 1
  Filled 2012-10-08 (×12): qty 2

## 2012-10-08 NOTE — Progress Notes (Signed)
Speech Language Pathology Daily Session Note & Weekly Progress Note  Patient Details  Name: Stephanie Mason MRN: [id number] Date of Birth: 1951-06-19  Today's Date: 10/08/2012 Time: 0930-1000 Time Calculation (min): 30 min  Short Term Goals: Week 2: SLP Short Term Goal 1 (Week 2): Patient will consume Dys.3 textures and thin liquids with supervision vebral cues to utilize safe swallow compensatory strategies. SLP Short Term Goal 2 (Week 2): Patient will attend to left upper extremity and environment with mod assist verbal cues. SLP Short Term Goal 3 (Week 2): Patient will request help as needed with various tasks with mod assist question cues. SLP Short Term Goal 4 (Week 2): Patient will utilize speech intelligibility strategies during verbal epression with min assist verbal and non-verbal cues.  Skilled Therapeutic Interventions: Skilled treatment session focused on addressing cognition goals during self-care tasks.  SLP facilitated session with moderately complex daily math problems.  Patient required mod assist instructional and question cues to self-identify and correct errors during medication, time and money management questions.    FIM:  Comprehension Comprehension Mode: Auditory Comprehension: 5-Follows basic conversation/direction: With extra time/assistive device Expression Expression Mode: Verbal Expression: 5-Expresses basic 90% of the time/requires cueing < 10% of the time. Social Interaction Social Interaction: 5-Interacts appropriately 90% of the time - Needs monitoring or encouragement for participation or interaction. Problem Solving Problem Solving: 4-Solves basic 75 - 89% of the time/requires cueing 10 - 24% of the time Memory Memory: 4-Recognizes or recalls 75 - 89% of the time/requires cueing 10 - 24% of the time FIM - Eating Eating Activity: 5: Supervision/cues  Pain Pain Assessment Pain Assessment: No/denies pain Pain Score:   8  Therapy/Group: Individual  Therapy   Speech Language Pathology Weekly Progress Note  Patient Details  Name: Stephanie Mason MRN: [id number] Date of Birth: [birth year]  Today's Date: 10/08/2012  Short Term Goals: Week 2: SLP Short Term Goal 1 (Week 2): Patient will consume Dys.3 textures and thin liquids with supervision vebral cues to utilize safe swallow compensatory strategies. SLP Short Term Goal 1 - Progress (Week 2): Met SLP Short Term Goal 2 (Week 2): Patient will attend to left upper extremity and environment with mod assist verbal cues. SLP Short Term Goal 2 - Progress (Week 2): Met SLP Short Term Goal 3 (Week 2): Patient will request help as needed with various tasks with mod assist question cues. SLP Short Term Goal 3 - Progress (Week 2): Met SLP Short Term Goal 4 (Week 2): Patient will utilize speech intelligibility strategies during verbal epression with min assist verbal and non-verbal cues. SLP Short Term Goal 4 - Progress (Week 2): Met Week 3: SLP Short Term Goal 1 (Week 3): Patient will consume regular textures and thin liquids with supervision verbal cues to utilize safe swallow compensatory strategies. SLP Short Term Goal 2 (Week 3): Patient will attend to left upper extremity and environment with supervision level verbal cues. SLP Short Term Goal 3 (Week 3): Patient will request help as needed with various tasks with min assist question cues. SLP Short Term Goal 4 (Week 3): Patient will perform diaphragmatic breathing exercises with mod assist verbal and visual cues.  Weekly Progress Updates: Patient met 4 out of 4 short term goals this reporting period due to gains in swallow function, cognition and speech intelligibility.  Patient has progressed from a Dys.3 textures diet to a regular texture diet, she is communicating, attending to her left upper extremity, solving basic problems and requesting help with min assist.  She continues to require supervision-min assist with basic tasks and mod assist  with complex tasks due to decreased self-monitoring and correcting.  Additionally, she requires min assist cues for speech intelligibility due to decreased breath support; as a result, SLP will address diaphragmatic breathing in the next reporting period.  SLP will continue to address cognition and self-care tasks with increased awareness, self-monitoring and correcting during the next reporting period with planned failure tasks.  Given these continued deficits this patient continues to require skilled SLP services to maximize functional independence to reduce burden of care prior to discharge home with family.      SLP Intensity: Minumum of 1-2 x/day, 30 to 90 minutes SLP Frequency: 5 out of 7 days SLP Duration/Estimated Length of Stay: 3/11 SLP Treatment/Interventions: Cognitive remediation/compensation;Cueing hierarchy;Dysphagia/aspiration precaution training;Environmental controls;Functional tasks;Internal/external aids;Medication managment;Patient/family education;Oral motor exercises;Speech/Language facilitation;Therapeutic Activities;Therapeutic Exercise  Charlane Ferretti., CCC-SLP [phone]  Waino Mounsey 10/08/2012, 10:18 AM

## 2012-10-08 NOTE — Progress Notes (Signed)
Physical Therapy Weekly Progress Note  Patient Details  Name: Stephanie Mason MRN: [id number] Date of Birth: [birth year]  Today's Date: 10/08/2012 Time: 1400-1500 Time Calculation (min): 60 min  Patient has met 3 of 5 short term goals. Patient is continuing to make steady progress in therapy. Patient was slightly limited over the last few days due to increased pain in L ankle, which she is now wearing an air cast for.  Patient continues to demonstrate the following deficits: decreased activity tolerance, decreased sitting and standing balance, decreased postural control in sitting and standing, L inattention, decreased awareness, and decreased coordination and therefore will continue to benefit from skilled PT intervention to enhance overall performance with activity tolerance, balance, postural control, ability to compensate for deficits, functional use of  left upper extremity and left lower extremity, attention, awareness and coordination.  Patient progressing toward long term goals..  Continue plan of care.  PT Short Term Goals Week 2:  PT Short Term Goal 1 (Week 2): Patient will perform squat pivot transfers with mod assist. PT Short Term Goal 1 - Progress (Week 2): Met PT Short Term Goal 2 (Week 2): Patient will perform bed mobility with mod assist. PT Short Term Goal 2 - Progress (Week 2): Met PT Short Term Goal 3 (Week 2): Patient will propel wheelchair 150' with min assist. PT Short Term Goal 3 - Progress (Week 2): Met PT Short Term Goal 4 (Week 2): Patient will perform dynamic sitting activities outside BOS with supervision. PT Short Term Goal 4 - Progress (Week 2): Progressing toward goal PT Short Term Goal 5 (Week 2): Patient will ambulate 77' with LRAD and mod assist. PT Short Term Goal 5 - Progress (Week 2): Progressing toward goal Week 3:  PT Short Term Goal 1 (Week 3): Patient will perform squat pivot transfers with min assist. PT Short Term Goal 2 (Week 3): Patient will  perform bed mobility with min assist PT Short Term Goal 3 (Week 3): Patient will perform dynamic sitting activities reaching outside of BOS with supervision PT Short Term Goal 4 (Week 3): Patient will ambulate 36' with LRAD and mod assist.  Skilled Therapeutic Interventions/Progress Updates:    Patient received sitting in wheelchair. Today's session focused on wheelchair mobility, dynamic sitting balance, and NMR L LE. Patient instructed in wheelchair mobility [phone] with supervision and verbal cues for attention to L. Patient performed dynamic sitting balance activities, reaching for numbered cards positioned on the floor and instructed to place them on a table positioned to her R. Activity emphasized L lateral extension and attention to and weight shifts anteriorly and to the L. Patient requires stand by assistance when reaching to the R, but requires mod-max assist when reaching to the L, especially reaching to the floor and to the L. Cognitive task of picking up numbers in order or only odd/even numbers. Patient with no difficulty with cognitive aspect of task.  Patient performed one stand with lateral weight shifts, but c/o L ankle pain and activity stopped. Patient exercised on NuStep Level 2 x10' with manual facilitation for activation of L LE musculature and assistance to maintain L hip abduction throughout exercise.  Patient returned to room and left seated in wheelchair with seatbelt donned and all needs within reach.   Therapy Documentation Precautions:  Precautions Precautions: Fall Precaution Comments: dense L hemiplegia, left neglect, L air cast as of 2/27 Restrictions Weight Bearing Restrictions: No Pain: Pain Assessment Pain Assessment: No/denies pain Pain Score: 0-No pain Locomotion : Ambulation Ambulation/Gait  Assistance: Not tested (comment) Wheelchair Mobility Distance: 155   See FIM for current functional status  Therapy/Group: Individual Therapy  Chipper Herb. Terriyah Westra, PT, DPT  10/08/2012, 4:49 PM

## 2012-10-08 NOTE — Progress Notes (Signed)
Social Work Patient ID: Stephanie Mason, female   DOB: Jan 19, 1951, 62 y.o.   MRN: [id number] Authorization for continued stay called into [employer] has authorized pt until 3/11.  Wants update 3/10 if plan changes.  Authorization [id number].

## 2012-10-08 NOTE — Progress Notes (Signed)
Social Work Patient ID: Stephanie Mason, female   DOB: [birth year], 62 y.o.   MRN: [id number]  Met with pt who wants to begin application for disability.  She is concerned she will not be able to return to work and wants to have a plan in place. Will discuss with daughter regarding beginning application on-line or wants a paper application.

## 2012-10-08 NOTE — Progress Notes (Signed)
Occupational  Therapy Note  Patient Details  Name: Stephanie Mason MRN: [id number] Date of Birth: [birth year] Today's Date: 10/08/2012  [employer] with SLP No c/o pain 07-1214 Self feeding group with focus on safe swallowing techniques with advanced diet of regular textures requiring assistance to cut up food (salad), positioning of left UE and use of left hand as stabilizer.   Roney Mans [facility] 10/08/2012, 7:48 PM

## 2012-10-08 NOTE — Progress Notes (Signed)
[name] ID: Stephanie Mason, female   DOB: 27-Dec-1950, 62 y.o.   MRN: [id number] Stephanie Mason is a 62 y.o. 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 Admitted 09/15/2012 to [facility] with left-sided weakness. Patient with noted seizure in the ED and received Ativan as well as loaded with Keppra. MRI of the brain showed large acute partially hemorrhagic right hemispheric infarct superimposed upon remote infarct. Patient did receive TPA. Echocardiogram with normal left ventricular function no source of embolus. EEG showed no seizure activity. Carotid Dopplers with no ICA stenosis. TCD with right terminal ICA stenosis. Patient was transferred to [facility] for ongoing evaluation. Placed on aspirin therapy for stroke prophylaxis as well as subcutaneous Lovenox for DVT prophylaxis. TEE completed 09/22/2012 showing no PFO or thrombus. Maintained on a dysphagia 3 thin liquid diet. Chest x-ray with questionable mass that was ruled out with CT of the chest  Subjective/Complaints: Objective: Concerned about return to work Vital Signs: Blood pressure 107/70, pulse 70, temperature 98.2 F (36.8 C), temperature source Oral, resp. rate 18, height 5\' 7"  (1.702 m), weight 70.4 kg (155 lb 3.3 oz), SpO2 95.00%. No results found. Results for orders placed during the hospital encounter of 09/22/12 (from the past 72 hour(s))  CREATININE, SERUM     Status: None   Collection Time    10/06/12  5:55 AM      Result Value Range   Creatinine, Ser 0.72  0.50 - 1.10 mg/dL   GFR calc non Af Amer >90  >90 mL/min   GFR calc Af Amer >90  >90 mL/min   Comment:            The eGFR has been calculated     using the CKD EPI equation.     This calculation has not been     validated in all clinical     situations.     eGFR's persistently     <90 mL/min signify     possible Chronic Kidney Disease.     HEENT: Left lower facial droop Cardio: RRR and  no extra sounds Resp: CTA B/L and unlabored GI: BS positive and non tender Extremity:  Pulses positive and No Edema Skin:   Intact and Other no pressure areas Neuro: Lethargic, Flat, Cranial Nerve Abnormalities L central VII, Abnormal Sensory Absent to LT, Proprio, Abnormal Motor 0/5 in LUE and LLE, Abnormal FMC Ataxic/ dec FMC, Reflexes: 0, Tone:  Trace to 1 tone left upper extremity and hip, Dysarthric and Inattention Musc/Skel:  Pain with palpation around deltoid, trap and AC joint left shoulder Gen NAD   Assessment/Plan: 1. Functional deficits secondary to Right MCA infarct with hemorrhage which require 3+ hours per day of interdisciplinary therapy in a comprehensive inpatient rehab setting. Physiatrist is providing close team supervision and 24 hour management of active medical problems listed below. Physiatrist and rehab team continue to assess barriers to discharge/monitor patient progress toward functional and medical goals. FIM: FIM - Bathing Bathing Steps Patient Completed: Chest;Left Arm;Abdomen;Right upper leg;Left upper leg;Right lower leg (including foot);Left lower leg (including foot);Front perineal area;Buttocks Bathing: 4: Min-Patient completes 8-9 48f 10 parts or 75+ percent  FIM - Upper Body Dressing/Undressing Upper body dressing/undressing steps patient completed: Thread/unthread right sleeve of pullover shirt/dresss;Put head through opening of pull over shirt/dress;Pull shirt over trunk Upper body dressing/undressing: 4: Min-Patient completed 75 plus % of tasks FIM - Lower Body Dressing/Undressing Lower body dressing/undressing steps patient completed: Thread/unthread right pants  leg;Don/Doff right shoe;Thread/unthread left pants leg Lower body dressing/undressing: 2: Max-Patient completed 25-49% of tasks  FIM - Toileting Toileting steps completed by patient: Adjust clothing prior to toileting;Performs perineal hygiene Toileting Assistive Devices: Grab bar or rail  for support Toileting: 3: Mod-Patient completed 2 of 3 steps  FIM - Diplomatic Services operational officer Devices: Grab bars Toilet Transfers: 3-To toilet/BSC: Mod A (lift or lower assist);3-From toilet/BSC: Mod A (lift or lower assist)  FIM - Bed/Chair Transfer Bed/Chair Transfer Assistive Devices: Arm rests Bed/Chair Transfer: 3: Supine > Sit: Mod A (lifting assist/Pt. 50-74%/lift 2 legs;3: Bed > Chair or W/C: Mod A (lift or lower assist)  FIM - Locomotion: Wheelchair Distance: 155 Locomotion: Wheelchair: 5: Travels 150 ft or more: maneuvers on rugs and over door sills with supervision, cueing or coaxing FIM - Locomotion: Ambulation Locomotion: Ambulation Assistive Devices: Chief Operating Officer Ambulation/Gait Assistance: 1: +1 Total assist Locomotion: Ambulation: 0: Activity did not occur  Comprehension Comprehension Mode: Auditory Comprehension: 5-Follows basic conversation/direction: With extra time/assistive device  Expression Expression Mode: Verbal Expression: 5-Expresses complex 90% of the time/cues < 10% of the time  Social Interaction Social Interaction Mode: Asleep Social Interaction: 5-Interacts appropriately 90% of the time - Needs monitoring or encouragement for participation or interaction.  Problem Solving Problem Solving Mode: Not assessed Problem Solving: 4-Solves basic 75 - 89% of the time/requires cueing 10 - 24% of the time  Memory Memory Mode: Asleep Memory: 4-Recognizes or recalls 75 - 89% of the time/requires cueing 10 - 24% of the time  Medical Problem List and Plan:  1. Partially hemorrhagic right MCA infarct superimposed upon remote right brain infarct with dense left hemiparesthesias and visual spatial deficits , developing intermittent painful spasms  Zanaflex, trial gabapentin for painful parasthesias. Added muscle rub also to above regimen 2. DVT Prophylaxis/Anticoagulation: Subcutaneous Lovenox. Monitor platelet counts and any signs of bleeding   3. Pain Management: Ultram as needed. Left ankle sprain, Ice and air cast 4. Neuropsych: This patient is not capable of making decisions on his/her own behalf.  5. Seizure disorder. Keppra 500 mg every 12 hours. EEG negative  6. Hypertension. Avapro 150 mg daily. Monitor with increased activity 7.  Dysphagia, pocketing soft textures and staff supervision 8.  Macrocytosis with borderline low RBCs MMA nl, RBC folate nl, admits to 1 beer a day at home-monitor 9.  Poor attention, ritalin trial with some benefit, increase dose, add SSRI for longer term use LOS (Days) 16 A FACE TO FACE EVALUATION WAS PERFORMED  Stephanie Mason 10/08/2012, 8:13 AM

## 2012-10-08 NOTE — Progress Notes (Signed)
Occupational Therapy Session Note  Patient Details  Name: Stephanie Mason MRN: [id number] Date of Birth: 1951-02-22  Today's Date: 10/08/2012 Time: 0810-0910 Time Calculation (min): 60 min  Short Term Goals: Week 2:  OT Short Term Goal 1 (Week 2): UB Dressing:  Min assist with pull over shirt with min instructional cues to follow hemi techniques OT Short Term Goal 2 (Week 2): Dynamic Standing Balance:  Mod assist with standing during LB bath, LB dressing and toileting tasks OT Short Term Goal 3 (Week 2): Toilet Transfer:  Mod assist with stand pivot transfer to and from toilet OT Short Term Goal 4 (Week 2): LUE Management:  Mod vcs for management and positioning of LUE during all BADL tasks, and functional mobility.  OT Short Term Goal 5 (Week 2): Vision:  Patient will locate items/obstacles on her left with no more than mod questioning cues.  Skilled Therapeutic Interventions/Progress Updates:  Self care retraining to include sponge bath (declined shower), dress and groom.  Focus session on postural control in sit and in stand,  dynamic standing balance, maintain forward weight shift during squat pivot transfers, with scooting and with sit><stand, placement of LLE before stand and during transfers, attention to left visual field, management of LUE during all functional mobility and during BADL, hemi dressing techniques.  Patient requires mod verbal and facilitation cues for placement of LLE before transfers, before stand and during standing - ankle edema and ankle tenderness still present and now has AirCast for ankle stability.  Minimal left shoulder horiz.adduction noted during UB bath.  Weight bearing through LLE and LUE while seated during all anterior and left weight shifts while bathing and dressing. Patient requires mod-max assist with dynamic standing when using RUE for bath or to pull up pants as patient pushing to left.  Patient able to manage and reposition LUE ~50% of the time during  session today.   Therapy Documentation Precautions:  Precautions Precautions: Fall Precaution Comments: dense L hemiplegia, left neglect Restrictions Weight Bearing Restrictions: No Pain: Pain Assessment Pain Assessment: No/denies pain ADL: See FIM for current functional status  Therapy/Group: Individual Therapy  Sherrod Toothman 10/08/2012, 1:59 PM

## 2012-10-08 NOTE — Progress Notes (Signed)
Speech Language Pathology Daily Session Note  Patient Details  Name: Stephanie Mason MRN: [id number] Date of Birth: 31-Mar-1951  Today's Date: 10/08/2012 Time: 1130-1200 Time Calculation (min): 30 min  Short Term Goals: Week 3: SLP Short Term Goal 1 (Week 3): Patient will consume regular textures and thin liquids with supervision verbal cues to utilize safe swallow compensatory strategies. SLP Short Term Goal 2 (Week 3): Patient will attend to left upper extremity and environment with supervision level verbal cues. SLP Short Term Goal 3 (Week 3): Patient will request help as needed with various tasks with min assist question cues. SLP Short Term Goal 4 (Week 3): Patient will perform diaphragmatic breathing exercises with mod assist verbal and visual cues.  Skilled Therapeutic Interventions: Pt participated in [employer] with treatment focus on dysphagia and self-feeding goals. Pt consumed upgraded diet of regular textures without overt s/s of aspiration and required supervision verbal cues to self-monitor and correct left buccal pocketing. Pt consumed small bites with Mod I. Pt also demonstrated increased social interaction with group members and utilized an increased vocal intensity at the sentence level.    FIM:  Comprehension Comprehension Mode: Auditory Comprehension: 5-Follows basic conversation/direction: With no assist Expression Expression Mode: Verbal Expression: 5-Expresses basic 90% of the time/requires cueing < 10% of the time. Social Interaction Social Interaction: 5-Interacts appropriately 90% of the time - Needs monitoring or encouragement for participation or interaction. Problem Solving Problem Solving: 4-Solves basic 75 - 89% of the time/requires cueing 10 - 24% of the time Memory Memory: 4-Recognizes or recalls 75 - 89% of the time/requires cueing 10 - 24% of the time FIM - Eating Eating Activity: 5: Supervision/cues;5: Set-up assist for cut food;5: Set-up assist  for open containers  Pain Pain Assessment Pain Assessment: No/denies pain  Therapy/Group: Group Therapy  [name] 10/08/2012, 2:38 PM

## 2012-10-09 ENCOUNTER — Inpatient Hospital Stay (HOSPITAL_COMMUNITY): Payer: BC Managed Care – PPO | Admitting: Occupational Therapy

## 2012-10-09 ENCOUNTER — Inpatient Hospital Stay (HOSPITAL_COMMUNITY): Payer: BC Managed Care – PPO | Admitting: Speech Pathology

## 2012-10-09 ENCOUNTER — Inpatient Hospital Stay (HOSPITAL_COMMUNITY): Payer: BC Managed Care – PPO | Admitting: *Deleted

## 2012-10-09 DIAGNOSIS — I6330 Cerebral infarction due to thrombosis of unspecified cerebral artery: Secondary | ICD-10-CM

## 2012-10-09 DIAGNOSIS — I69998 Other sequelae following unspecified cerebrovascular disease: Secondary | ICD-10-CM

## 2012-10-09 DIAGNOSIS — G8110 Spastic hemiplegia affecting unspecified side: Secondary | ICD-10-CM

## 2012-10-09 DIAGNOSIS — R209 Unspecified disturbances of skin sensation: Secondary | ICD-10-CM

## 2012-10-09 MED ORDER — OXYBUTYNIN CHLORIDE 5 MG PO TABS
5.0000 mg | ORAL_TABLET | Freq: Every day | ORAL | Status: DC
  Administered 2012-10-09 – 2012-10-18 (×10): 5 mg via ORAL
  Filled 2012-10-09 (×13): qty 1

## 2012-10-09 NOTE — Progress Notes (Signed)
Physical Therapy Session Note  Patient Details  Name: Stephanie Mason MRN: [id number] Date of Birth: 1951/01/12  Today's Date: 10/09/2012 Time: 1330-1415 Time Calculation (min): 45 min  Short Term Goals: Week 3:  PT Short Term Goal 1 (Week 3): Patient will perform squat pivot transfers with min assist. PT Short Term Goal 2 (Week 3): Patient will perform bed mobility with min assist PT Short Term Goal 3 (Week 3): Patient will perform dynamic sitting activities reaching outside of BOS with supervision PT Short Term Goal 4 (Week 3): Patient will ambulate 53' with LRAD and mod assist.  Skilled Therapeutic Interventions/Progress Updates:    Patient received sitting in wheelchair. Today's session focused on wheelchair mobility, gait training, and L LE stretching. See details below for wheelchair mobility and gait training. Sitting in wheelchair, L hamstring and adductor stretching performed 3 x30" each. Patient cannot tolerate increased stretching secondary to c/o pain. Noted increased tone/tightness in L hamstrings and adductors.  Patient returned to room and left seated in wheelchair with seatbelt donned and all needs within reach.  Therapy Documentation Precautions:  Precautions Precautions: Fall Precaution Comments: dense L hemiplegia, left neglect, L air cast as of 2/27 Restrictions Weight Bearing Restrictions: No Pain: Pain Assessment Pain Assessment: No/denies pain Pain Score: 0-No pain Locomotion : Ambulation Ambulation: Yes Ambulation/Gait Assistance: 2: Max assist Ambulation Distance (Feet): 45 Feet Assistive device: Hemi-walker and L DF ace wrap Ambulation/Gait Assistance Details: Verbal cues for sequencing;Verbal cues for technique;Verbal cues for precautions/safety;Verbal cues for gait pattern;Verbal cues for safe use of DME/AE;Tactile cues for posture;Manual facilitation for weight shifting;Manual facilitation for placement;Manual facilitation for weight  bearing Gait Gait: Yes Gait Pattern: Step-to pattern;Decreased step length - right;Decreased step length - left;Decreased stance time - left;Decreased stride length;Decreased hip/knee flexion - left;Decreased dorsiflexion - left;Narrow base of support;Trunk flexed;Decreased weight shift to left;Lateral trunk lean to left Stairs / Additional Locomotion Stairs: No Wheelchair Mobility Wheelchair Mobility: Yes Wheelchair Assistance: 5: Supervision Wheelchair Assistance Details: Verbal cues for Engineer, drilling: Right upper extremity;Right lower extremity Wheelchair Parts Management: Needs assistance Distance: 155   See FIM for current functional status  Therapy/Group: Individual Therapy  Chipper Herb. Sidni Fusco, PT, DPT  10/09/2012, 2:26 PM

## 2012-10-09 NOTE — Progress Notes (Signed)
Patient ID: Stephanie Mason, female   DOB: May 23, 1951, 62 y.o.   MRN: [id number] Stephanie Mason is a 62 y.o. 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 Admitted 09/15/2012 to [facility] with left-sided weakness. Patient with noted seizure in the ED and received Ativan as well as loaded with Keppra. MRI of the brain showed large acute partially hemorrhagic right hemispheric infarct superimposed upon remote infarct. Patient did receive TPA. Echocardiogram with normal left ventricular function no source of embolus. EEG showed no seizure activity. Carotid Dopplers with no ICA stenosis. TCD with right terminal ICA stenosis. Patient was transferred to [facility] for ongoing evaluation. Placed on aspirin therapy for stroke prophylaxis as well as subcutaneous Lovenox for DVT prophylaxis. TEE completed 09/22/2012 showing no PFO or thrombus. Maintained on a dysphagia 3 thin liquid diet. Chest x-ray with questionable mass that was ruled out with CT of the chest  Subjective/Complaints: Objective: Call for nsg but have to go right away Vital Signs: Blood pressure 92/55, pulse 69, temperature 98.1 F (36.7 C), temperature source Oral, resp. rate 19, height 5\' 7"  (1.702 m), weight 70.4 kg (155 lb 3.3 oz), SpO2 96.00%. No results found. No results found for this or any previous visit (from the past 72 hour(s)).   HEENT: Left lower facial droop Cardio: RRR and no extra sounds Resp: CTA B/L and unlabored GI: BS positive and non tender Extremity:  Pulses positive and No Edema Skin:   Intact and Other no pressure areas Neuro: Lethargic, Flat, Cranial Nerve Abnormalities L central VII, Abnormal Sensory Absent to LT, [employer]pri[employer], Abnormal Motor 0/5 in LUE and LLE, Abnormal FMC Ataxic/ dec FMC, Reflexes: 0, Tone:  Trace to 1 tone left upper extremity and hip, Dysarthric and Inattention Musc/Skel:  Pain with palpation around deltoid, trap and AC joint  left shoulder Gen NAD   Assessment/Plan: 1. Functional deficits secondary to Right MCA infarct with hemorrhage which require 3+ hours per day of interdisciplinary therapy in a comprehensive inpatient rehab setting. Physiatrist is providing close team supervision and 24 hour management of active medical problems listed below. Physiatrist and rehab team continue to assess barriers to discharge/monitor patient progress toward functional and medical goals. FIM: FIM - Bathing Bathing Steps Patient Completed: Chest;Left Arm;Abdomen;Right upper leg;Left upper leg;Right lower leg (including foot);Left lower leg (including foot);Front perineal area;Buttocks Bathing: 4: Min-Patient completes 8-9 70f 10 parts or 75+ percent (Max assist with dynamic standing)  FIM - Upper Body Dressing/Undressing Upper body dressing/undressing steps patient completed: Thread/unthread right sleeve of pullover shirt/dresss;Put head through opening of pull over shirt/dress;Pull shirt over trunk Upper body dressing/undressing: 4: Min-Patient completed 75 plus % of tasks FIM - Lower Body Dressing/Undressing Lower body dressing/undressing steps patient completed: Thread/unthread right pants leg;Don/Doff right shoe;Thread/unthread left pants leg;Pull pants up/down Lower body dressing/undressing: 2: Max-Patient completed 25-49% of tasks  FIM - Toileting Toileting steps completed by patient: Adjust clothing prior to toileting;Performs perineal hygiene Toileting Assistive Devices: Grab bar or rail for support Toileting: 3: Mod-Patient completed 2 of 3 steps  FIM - Diplomatic Services operational officer Devices: Grab bars Toilet Transfers: 3-To toilet/BSC: Mod A (lift or lower assist);3-From toilet/BSC: Mod A (lift or lower assist)  FIM - Press photographer Assistive Devices: Arm rests Bed/Chair Transfer: 3: Bed > Chair or W/C: Mod A (lift or lower assist);3: Chair or W/C > Bed: Mod A (lift or lower  assist)  FIM - Locomotion: Wheelchair Distance: 155 Locomotion: Wheelchair:  5: Travels 150 ft or more: maneuvers on rugs and over door sills with supervision, cueing or coaxing FIM - Locomotion: Ambulation Locomotion: Ambulation Assistive Devices: Walker - Hemi Ambulation/Gait Assistance: Not tested (comment) Locomotion: Ambulation: 0: Activity did not occur  Comprehension Comprehension Mode: Auditory Comprehension: 5-Follows basic conversation/direction: With no assist  Expression Expression Mode: Verbal Expression: 5-Expresses basic 90% of the time/requires cueing < 10% of the time.  Social Interaction Social Interaction Mode: Asleep Social Interaction: 5-Interacts appropriately 90% of the time - Needs monitoring or encouragement for participation or interaction.  Problem Solving Problem Solving Mode: Not assessed Problem Solving: 4-Solves basic 75 - 89% of the time/requires cueing 10 - 24% of the time  Memory Memory Mode: Asleep Memory: 4-Recognizes or recalls 75 - 89% of the time/requires cueing 10 - 24% of the time  Medical Problem List and Plan:  1. Partially hemorrhagic right MCA infarct superimposed upon remote right brain infarct with dense left hemiparesthesias and visual spatial deficits , developing intermittent painful spasms  Zanaflex, trial gabapentin for painful parasthesias. Added muscle rub also to above regimen 2. DVT Prophylaxis/Anticoagulation: Subcutaneous Lovenox. Monitor platelet counts and any signs of bleeding  3. Pain Management: Ultram as needed. Left ankle sprain, Ice and air cast 4. Neuropsych: This patient is not capable of making decisions on his/her own behalf.  5. Seizure disorder. Keppra 500 mg every 12 hours. EEG negative  6. Hypertension. Avapro 150 mg daily. Monitor with increased activity 7.  Dysphagia, pocketing soft textures and staff supervision 8.  Macrocytosis with borderline low RBCs MMA nl, RBC folate nl, admits to 1 beer a day at  home-monitor 9.  Poor attention, ritalin trial with some benefit, increase dose, add SSRI for longer term use 10.  Urinary incont mainly urgency, trial ditropan at noc LOS (Days) 17 A FACE TO FACE EVALUATION WAS PERFORMED  Stephanie Mason 10/09/2012, 8:56 AM

## 2012-10-09 NOTE — Progress Notes (Signed)
Occupational Therapy Session Note  Patient Details  Name: Stephanie Mason MRN: [id number] Date of Birth: 06-06-1951  Today's Date: 10/09/2012 Time: 0800-0900 Time Calculation (min): 60 min  Short Term Goals: Week 2:  OT Short Term Goal 1 (Week 2): UB Dressing:  Min assist with pull over shirt with min instructional cues to follow hemi techniques OT Short Term Goal 2 (Week 2): Dynamic Standing Balance:  Mod assist with standing during LB bath, LB dressing and toileting tasks OT Short Term Goal 3 (Week 2): Toilet Transfer:  Mod assist with stand pivot transfer to and from toilet OT Short Term Goal 4 (Week 2): LUE Management:  Mod vcs for management and positioning of LUE during all BADL tasks, and functional mobility.  OT Short Term Goal 5 (Week 2): Vision:  Patient will locate items/obstacles on her left with no more than mod questioning cues.  Skilled Therapeutic Interventions/Progress Updates:  Self care retraining to include sponge bath, dress, transfer and groom.  Focus session on remembering and executing bed mobility techniques to reduce level of assistance when transitioning from supine to sit EOB, LUE weight bearing during anterior and left lateral weight shifts during BADL, attention to left, hemi dressing techniques, sit><stand, stand pivot transfer.  Patient continues to require mod verbal and facilitation cues to maintain hip flexion and anterior weight shifts during scooting, sit><stand and transfers.  Patient often throws her head and shoulders back instead of keeping head and shoulders forward to unweight hips.  Therapy Documentation Precautions:  Precautions Precautions: Fall Precaution Comments: dense L hemiplegia, left neglect, L air cast as of 2/27 Restrictions Weight Bearing Restrictions: No Pain: Denies pain ADL: See FIM for current functional status  Therapy/Group: Individual Therapy  Johnanthony Wilden 10/09/2012, 10:28 AM

## 2012-10-09 NOTE — Progress Notes (Signed)
Social Work Patient ID: Stephanie Mason, female   DOB: 01-07-1951, 62 y.o.   MRN: [id number] Met with pt and spoke with daughter via telephone to discuss pt's plan now to go to NH then home with daughter taking a leave from work. Daughter thought the same as worker plan was to go home from rehab.  Will have daughter come in next week and go through therapies,  But believe best option is for pt to go to [employer] where she worked then go home at a higher functional level.  Continue to work On a discharge plan.  Daughter to begin [facility] application for pt.

## 2012-10-09 NOTE — Progress Notes (Signed)
Occupational Therapy Note  Patient Details  Name: Stephanie Mason MRN: [id number] Date of Birth: [birth year] Today's Date: 10/09/2012  [employer] with SLP [id number] No c/o pain Self feeding group with focus on functional use of left UE as stabilizer for setup of tray, safe swallowing techniques with upgraded meal of regular textures, vocal intensity when making needs and wants known, basic problem solving .   Roney Mans [facility] 10/09/2012, 1:23 PM

## 2012-10-09 NOTE — Progress Notes (Signed)
Speech Language Pathology Daily Session Note  Patient Details  Name: Stephanie Mason MRN: [id number] Date of Birth: 02-Nov-1950  Today's Date: 10/09/2012 Time: 1130-1145 Time Calculation (min): 15 min  Short Term Goals: Week 3: SLP Short Term Goal 1 (Week 3): Patient will consume regular textures and thin liquids with supervision verbal cues to utilize safe swallow compensatory strategies. SLP Short Term Goal 2 (Week 3): Patient will attend to left upper extremity and environment with supervision level verbal cues. SLP Short Term Goal 3 (Week 3): Patient will request help as needed with various tasks with min assist question cues. SLP Short Term Goal 4 (Week 3): Patient will perform diaphragmatic breathing exercises with mod assist verbal and visual cues.  Skilled Therapeutic Interventions: Pt participated in [employer] with treatment focus on dysphagia and self-feeding goals. Pt consumed regular textures with thin liquids without overt s/s of aspiration and required supervision verbal cues to self-monitor and correct left buccal pocketing. Pt consumed small bites with Mod I.    FIM:  Comprehension Comprehension Mode: Auditory Comprehension: 5-Follows basic conversation/direction: With no assist Expression Expression Mode: Verbal Expression: 5-Expresses basic 90% of the time/requires cueing < 10% of the time. Social Interaction Social Interaction: 5-Interacts appropriately 90% of the time - Needs monitoring or encouragement for participation or interaction. Problem Solving Problem Solving: 5-Solves basic 90% of the time/requires cueing < 10% of the time Memory Memory: 5-Recognizes or recalls 90% of the time/requires cueing < 10% of the time FIM - Eating Eating Activity: 5: Supervision/cues;5: Set-up assist for cut food;5: Set-up assist for open containers  Pain Pain Assessment Pain Assessment: No/denies pain Pain Score: 0-No pain  Therapy/Group: Group Therapy  Eleah Lahaie,  Kayleigh Broadwell 10/09/2012, 3:46 PM

## 2012-10-09 NOTE — Progress Notes (Signed)
Speech Language Pathology Daily Session Note  Patient Details  Name: Stephanie Mason MRN: [id number] Date of Birth: [birth year]  Today's Date: 10/09/2012 Time: 0915-1000 Time Calculation (min): 45 min  Short Term Goals: Week 3: SLP Short Term Goal 1 (Week 3): Patient will consume regular textures and thin liquids with supervision verbal cues to utilize safe swallow compensatory strategies. SLP Short Term Goal 2 (Week 3): Patient will attend to left upper extremity and environment with supervision level verbal cues. SLP Short Term Goal 3 (Week 3): Patient will request help as needed with various tasks with min assist question cues. SLP Short Term Goal 4 (Week 3): Patient will perform diaphragmatic breathing exercises with mod assist verbal and visual cues.  Skilled Therapeutic Interventions: Skilled treatment session focused on addressing problem solving during a complex self-care task.  SLP facilitated session with medication management task and max assist to identify medication and function; mod assist to request assist as needed; min assist to accurately load medication box and locate various named days and times.   SLP also facilitated session with written aid to assist with recall of new/complex information, resulting in decrease in cues to min assist level by end of session.    FIM:  Comprehension Comprehension Mode: Auditory Comprehension: 5-Follows basic conversation/direction: With no assist Expression Expression Mode: Verbal Expression: 5-Expresses basic 90% of the time/requires cueing < 10% of the time. Social Interaction Social Interaction: 5-Interacts appropriately 90% of the time - Needs monitoring or encouragement for participation or interaction. Problem Solving Problem Solving: 5-Solves basic 90% of the time/requires cueing < 10% of the time Memory Memory: 5-Recognizes or recalls 90% of the time/requires cueing < 10% of the time FIM - Eating Eating Activity: 5:  Supervision/cues;5: Set-up assist for cut food;5: Set-up assist for open containers  Pain Pain Assessment Pain Assessment: No/denies pain  Therapy/Group: Individual Therapy  Charlane Ferretti., CCC-SLP [phone]  [name] 10/09/2012, 1:28 PM

## 2012-10-10 ENCOUNTER — Inpatient Hospital Stay (HOSPITAL_COMMUNITY): Payer: BC Managed Care – PPO | Admitting: Occupational Therapy

## 2012-10-10 DIAGNOSIS — M79642 Pain in left hand: Secondary | ICD-10-CM | POA: Clinically undetermined

## 2012-10-10 NOTE — Progress Notes (Signed)
Occupational Therapy Note  Patient Details  Name: Stephanie Mason MRN: [id number] Date of Birth: Oct 29, 1950 Today's Date: 10/10/2012  [employer] with SLP [phone] No c/o pain Self feeding group with focus on functional use of left UE as stabilizer for setup of tray, safe swallowing techniques with upgraded meal of regular textures, vocal intensity when making needs and wants known, basic problem solving.  Patient required verbal cues ~50% of the time to wipe mouth on left side.  Hiren Peplinski 10/10/2012, 12:55 PM

## 2012-10-10 NOTE — Progress Notes (Addendum)
Speech Language Pathology Daily Session Note  Patient Details  Name: Stephanie Mason MRN: [id number] Date of Birth: 1951/06/04  Today's Date: 10/10/2012 Time: 6578-4696 Time Calculation (min): 35 min  Short Term Goals: Week 3: SLP Short Term Goal 1 (Week 3): Patient will consume regular textures and thin liquids with supervision verbal cues to utilize safe swallow compensatory strategies. SLP Short Term Goal 2 (Week 3): Patient will attend to left upper extremity and environment with supervision level verbal cues. SLP Short Term Goal 3 (Week 3): Patient will request help as needed with various tasks with min assist question cues. SLP Short Term Goal 4 (Week 3): Patient will perform diaphragmatic breathing exercises with mod assist verbal and visual cues.  Skilled Therapeutic Interventions: Skilled group co-treatment with OT; SLP focused on addressing dysphagia goals.  SLP facilitated session with set up assist and supervision level verbal cues to self-monitor and correct left pocketing as well as left anterior loss of regular textures and thin liquids.  Patient displayed no overt s/s of aspiration during meal.  Recommend continuation of current plan of care.   FIM:  Comprehension Comprehension Mode: Auditory Comprehension: 5-Follows basic conversation/direction: With extra time/assistive device Expression Expression Mode: Verbal Expression: 5-Expresses basic needs/ideas: With no assist Social Interaction Social Interaction: 5-Interacts appropriately 90% of the time - Needs monitoring or encouragement for participation or interaction. Problem Solving Problem Solving: 5-Solves basic 90% of the time/requires cueing < 10% of the time Memory Memory: 5-Recognizes or recalls 90% of the time/requires cueing < 10% of the time FIM - Eating Eating Activity: 5: Set-up assist for cut food;5: Set-up assist for open containers;5: Supervision/cues  Pain Pain Assessment Pain Assessment: No/denies  pain  Therapy/Group: Group Therapy  Charlane Ferretti., CCC-SLP [phone]  Stephanie Mason 10/10/2012, 1:20 PM

## 2012-10-10 NOTE — Progress Notes (Signed)
Occupational Therapy Session Note  Patient Details  Name: Stephanie Mason MRN: [id number] Date of Birth: 01/12/1951  Today's Date: 10/10/2012 Time: 1405-1450 Time Calculation (min): 45 min  Short Term Goals: Week 2:  OT Short Term Goal 1 (Week 2): UB Dressing:  Min assist with pull over shirt with min instructional cues to follow hemi techniques OT Short Term Goal 2 (Week 2): Dynamic Standing Balance:  Mod assist with standing during LB bath, LB dressing and toileting tasks OT Short Term Goal 3 (Week 2): Toilet Transfer:  Mod assist with stand pivot transfer to and from toilet OT Short Term Goal 4 (Week 2): LUE Management:  Mod vcs for management and positioning of LUE during all BADL tasks, and functional mobility.  OT Short Term Goal 5 (Week 2): Vision:  Patient will locate items/obstacles on her left with no more than mod questioning cues.  Skilled Therapeutic Interventions/Progress Updates:  Patient in w/c upon arrival with ice pack on left ankle.  Engaged in transfer training, bed mobility, and bed positioning.  Patient very sleepy during session stating that she thought it might be because of medication.  Patient often falling asleep and difficult keeping eyes open.  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  Practiced bed positioning on left side, rolling while positioning and managing her LUR & LLE.    Therapy Documentation Precautions:  Precautions Precautions: Fall Precaution Comments: dense L hemiplegia, left neglect, L air cast as of 2/27 Restrictions Weight Bearing Restrictions: No Pain: Patient denies pain when asked except in left hip during exercises in supine.  Patient premedicated and repositioning helped.   Therapy/Group: Individual Therapy  Marissah Vandemark,  Arthuro Canelo 10/10/2012, 3:51 PM

## 2012-10-10 NOTE — Progress Notes (Signed)
Stephanie Mason is a 62 y.o. female 1951/02/10 [id number]  Subjective: C/o L hand pain. No new problems. Slept well. Feeling OK.  Objective: Vital signs in last 24 hours: Temp:  [97.5 F (36.4 C)] 97.5 F (36.4 C) (03/01 0550) Pulse Rate:  [65-71] 71 (03/01 0550) Resp:  [18-19] 18 (03/01 0550) BP: (108-111)/(68-71) 108/68 mmHg (03/01 0550) SpO2:  [98 %] 98 % (03/01 0550) Weight change:  Last BM Date: 10/08/12  Intake/Output from previous day: 02/28 0701 - 03/01 0700 In: 240 [P.O.:240] Out: -  Last cbgs: CBG (last 3)  No results found for this basename: GLUCAP,  in the last 72 hours   Physical Exam General: No apparent distress    HEENT: moist mucosa Lungs: Normal effort. Lungs clear to auscultation, no crackles or wheezes. Cardiovascular: Regular rate and rhythm, no edema Musculoskeletal:  No change from before Neurological: No new neurological deficits Wounds: N/A    Skin: clear Alert, cooperative L hand w/o swelling; NT w/ROM including the wrist   Lab Results: BMET    Component Value Date/Time   NA 142 09/23/2012 0610   K 4.4 09/23/2012 0610   CL 106 09/23/2012 0610   CO2 27 09/23/2012 0610   GLUCOSE 98 09/23/2012 0610   BUN 14 09/23/2012 0610   CREATININE 0.72 10/06/2012 0555   CALCIUM 9.5 09/23/2012 0610   GFRNONAA >90 10/06/2012 0555   GFRAA >90 10/06/2012 0555   CBC    Component Value Date/Time   WBC 4.4 09/23/2012 0610   RBC 3.96 09/23/2012 0610   HGB 13.4 09/23/2012 0610   HCT 40.3 09/23/2012 0610   PLT 243 09/23/2012 0610   MCV 101.8* 09/23/2012 0610   MCH 33.8 09/23/2012 0610   MCHC 33.3 09/23/2012 0610   RDW 12.1 09/23/2012 0610   LYMPHSABS 1.0 09/23/2012 0610   MONOABS 0.5 09/23/2012 0610   EOSABS 0.1 09/23/2012 0610   BASOSABS 0.0 09/23/2012 0610    Studies/Results: No results found.  Medications: I have reviewed the patient's current medications.  Assessment/Plan:   1. Partially hemorrhagic right MCA infarct superimposed upon remote right brain  infarct with dense left hemiparesthesias and visual spatial deficits , developing intermittent painful spasms Zanaflex, trial gabapentin for painful parasthesias. Added muscle rub also to above regimen  2. DVT Prophylaxis/Anticoagulation: Subcutaneous Lovenox. Monitor platelet counts and any signs of bleeding  3. Pain Management: Ultram as needed. Left ankle sprain, Ice and air cast  4. Neuropsych: This patient is not capable of making decisions on his/her own behalf.  5. Seizure disorder. Keppra 500 mg every 12 hours. EEG negative  6. Hypertension. Avapro 150 mg daily. Monitor with increased activity  7. Dysphagia, pocketing soft textures and staff supervision  8. Macrocytosis with borderline low RBCs MMA nl, RBC folate nl, admits to 1 beer a day at home-monitor  9. Poor attention, ritalin trial with some benefit, increase dose, add SSRI for longer term use  10. Urinary incont mainly urgency, trial ditropan at noc 11. L hand pain - use Tramadol prn   Length of stay, days: 18  Sonda Primes , MD 10/10/2012, 8:37 AM

## 2012-10-11 ENCOUNTER — Inpatient Hospital Stay (HOSPITAL_COMMUNITY): Payer: BC Managed Care – PPO | Admitting: Physical Therapy

## 2012-10-11 NOTE — Plan of Care (Signed)
Problem: RH BLADDER ELIMINATION Goal: RH STG MANAGE BLADDER WITH ASSISTANCE STG Manage Bladder With mod Assistance  Outcome: Progressing Cont during day, requires prompting at night to remain cont

## 2012-10-11 NOTE — Progress Notes (Signed)
Stephanie Mason is a 62 y.o. female 06/23/1951 [id number]  Subjective: C/o pain in the legs. She is asking for more pain meds. No L hand pain this am. No new problems. Slept well. Feeling OK.  Objective: Vital signs in last 24 hours: Temp:  [97.9 F (36.6 C)-98.1 F (36.7 C)] 98.1 F (36.7 C) (03/02 0524) Pulse Rate:  [65-67] 65 (03/02 0524) Resp:  [17] 17 (03/02 0524) BP: (88-106)/(58-69) 106/69 mmHg (03/02 0524) SpO2:  [95 %] 95 % (03/02 0524) Weight change:  Last BM Date: 10/08/12  Intake/Output from previous day: 03/01 0701 - 03/02 0700 In: 1080 [P.O.:1080] Out: -  Last cbgs: CBG (last 3)  No results found for this basename: GLUCAP,  in the last 72 hours   Physical Exam General: No apparent distress    HEENT: moist mucosa Lungs: Normal effort. Lungs clear to auscultation, no crackles or wheezes. Cardiovascular: Regular rate and rhythm, no edema Musculoskeletal:  No change from before Neurological: No new neurological deficits Wounds: N/A    Skin: clear Alert, cooperative L hand w/o swelling; NT w/ROM including the wrist; B LE - NT   Lab Results: BMET    Component Value Date/Time   NA 142 09/23/2012 0610   K 4.4 09/23/2012 0610   CL 106 09/23/2012 0610   CO2 27 09/23/2012 0610   GLUCOSE 98 09/23/2012 0610   BUN 14 09/23/2012 0610   CREATININE 0.72 10/06/2012 0555   CALCIUM 9.5 09/23/2012 0610   GFRNONAA >90 10/06/2012 0555   GFRAA >90 10/06/2012 0555   CBC    Component Value Date/Time   WBC 4.4 09/23/2012 0610   RBC 3.96 09/23/2012 0610   HGB 13.4 09/23/2012 0610   HCT 40.3 09/23/2012 0610   PLT 243 09/23/2012 0610   MCV 101.8* 09/23/2012 0610   MCH 33.8 09/23/2012 0610   MCHC 33.3 09/23/2012 0610   RDW 12.1 09/23/2012 0610   LYMPHSABS 1.0 09/23/2012 0610   MONOABS 0.5 09/23/2012 0610   EOSABS 0.1 09/23/2012 0610   BASOSABS 0.0 09/23/2012 0610    Studies/Results: No results found.  Medications: I have reviewed the patient's current  medications.  Assessment/Plan:   1. 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 Added muscle rub also to above regimen  2. DVT Prophylaxis/Anticoagulation: Subcutaneous Lovenox. Monitor platelet counts and any signs of bleeding  3. Pain Management: Ultram as needed. Left ankle sprain, Ice and air cast  4. Neuropsych: This patient is not capable of making decisions on his/her own behalf.  5. Seizure disorder. Keppra 500 mg every 12 hours. EEG negative  6. Hypertension. Avapro 150 mg daily. Monitor with increased activity  7. Dysphagia, pocketing soft textures and staff supervision  8. Macrocytosis with borderline low RBCs MMA nl, RBC folate nl, admits to 1 beer a day at home-monitor  9. Poor attention, ritalin trial with some benefit, increase dose, add SSRI for longer term use  10. Urinary incont mainly urgency, trial ditropan at noc 11. L hand pain - resolved. Leg pain - use Tramadol prn. I haven't changed her meds   Length of stay, days: 19  Sonda Primes , MD 10/11/2012, 8:50 AM

## 2012-10-11 NOTE — Progress Notes (Signed)
Physical Therapy Note  Patient Details  Name: Stephanie Mason MRN: [id number] Date of Birth: 1950-12-28 Today's Date: 10/11/2012  1400-1500 (60 minutes) individual Pain: pt reports intermittent pain LT ankle/ premedicated Other: Air splint LT ankle Focus of treatment: neuro re-ed LT LE; sit to stand to standing focusing on LT knee extension control; gait training with appropriate assistive device Treatment: transfer - scoot to squat /pivot min/mod assist ; sit to supine (mat)min assist LT LE; supine to side to sit (mat ) SBA; Neuro re-ed LT LE- AA hip flexion/extension in gravity eliminated position, AA supine hip/knee extension; bridging; AA hip rotation in hooklying;AA hip abduction; sit to stand in STEDY 3 X 10 with assist to attain terminal knee extension on left; gait 6 -7 X 2 steps with EVA walker + ace wrap / air splint LT ankle mod assist with pt advancing LT LE with flexed knee in stance.    HOUT,JIM 10/11/2012, 3:06 PM

## 2012-10-12 ENCOUNTER — Inpatient Hospital Stay (HOSPITAL_COMMUNITY): Payer: BC Managed Care – PPO | Admitting: *Deleted

## 2012-10-12 ENCOUNTER — Inpatient Hospital Stay (HOSPITAL_COMMUNITY): Payer: BC Managed Care – PPO | Admitting: Speech Pathology

## 2012-10-12 ENCOUNTER — Encounter (HOSPITAL_COMMUNITY): Payer: BC Managed Care – PPO | Admitting: Occupational Therapy

## 2012-10-12 DIAGNOSIS — I6330 Cerebral infarction due to thrombosis of unspecified cerebral artery: Secondary | ICD-10-CM

## 2012-10-12 DIAGNOSIS — G8110 Spastic hemiplegia affecting unspecified side: Secondary | ICD-10-CM

## 2012-10-12 DIAGNOSIS — I69998 Other sequelae following unspecified cerebrovascular disease: Secondary | ICD-10-CM

## 2012-10-12 DIAGNOSIS — R209 Unspecified disturbances of skin sensation: Secondary | ICD-10-CM

## 2012-10-12 NOTE — Progress Notes (Signed)
Occupational Therapy Session Note  Patient Details  Name: Stephanie Mason MRN: [id number] Date of Birth: 10-26-1950  Today's Date: 10/12/2012 Time: 1000-1100 Time Calculation (min): 60 min  Short Term Goals: Week 2:  OT Short Term Goal 1 (Week 2): UB Dressing:  Min assist with pull over shirt with min instructional cues to follow hemi techniques OT Short Term Goal 2 (Week 2): Dynamic Standing Balance:  Mod assist with standing during LB bath, LB dressing and toileting tasks OT Short Term Goal 3 (Week 2): Toilet Transfer:  Mod assist with stand pivot transfer to and from toilet OT Short Term Goal 4 (Week 2): LUE Management:  Mod vcs for management and positioning of LUE during all BADL tasks, and functional mobility.  OT Short Term Goal 5 (Week 2): Vision:  Patient will locate items/obstacles on her left with no more than mod questioning cues.  Skilled Therapeutic Interventions/Progress Updates:    1:1 self care retraining at sink level (pt's choice). Focus on attention to right UE and positioning of it at all times, sit to stands with focus on maintaining a forward weight shift, standing balance with and without UE support for peri hygiene and clothing management with min to mod A, use of left UE as stabilizer. Pt demonstrated good carry over of methods she and her primary therapist have been working on with questioning cues. Pt located and obtained items in left field with occasional min questioning cuing. Pt able to self correct mistake in threading left UE with questioning cue and able to don shirt with min A.   When to the gym to continued to work on squat pivot transfers with forward weight shift and sit<>supine with min A- mod A with instructional cues. In supine and sidelying worked on scapular mobility and activation- performing modified PNF patterns. Pt able to initiate some scapular movement in gravity eliminated plain but not able to sustain.   Therapy Documentation Precautions:   Precautions Precautions: Fall Precaution Comments: dense L hemiplegia, left neglect, L air cast as of 2/27 Restrictions Weight Bearing Restrictions: No Pain: Pain Assessment Pain Assessment: No/denies pain Pain Score:   3 Faces Pain Scale: Hurts a little bit Pain Type: Acute pain Pain Location: Leg Pain Orientation: Left Pain Descriptors: Aching Pain Onset: Gradual Patients Stated Pain Goal: 2 Pain Intervention(s): Repositioned;Massage Multiple Pain Sites: No  See FIM for current functional status  Therapy/Group: Individual Therapy  Roney Mans [facility] 10/12/2012, 12:08 PM

## 2012-10-12 NOTE — Progress Notes (Signed)
Speech Language Pathology Daily Session Notes  Patient Details  Name: Stephanie Mason MRN: [id number] Date of Birth: [birth year]  Today's Date: 10/12/2012  Session 1 Time: 0930-1000 Time Calculation (min): 30 min  Session 2 Time: 1130-1150 Time Calculation: 20 min  Short Term Goals: Week 3: SLP Short Term Goal 1 (Week 3): Patient will consume regular textures and thin liquids with supervision verbal cues to utilize safe swallow compensatory strategies. SLP Short Term Goal 2 (Week 3): Patient will attend to left upper extremity and environment with supervision level verbal cues. SLP Short Term Goal 3 (Week 3): Patient will request help as needed with various tasks with min assist question cues. SLP Short Term Goal 4 (Week 3): Patient will perform diaphragmatic breathing exercises with mod assist verbal and visual cues.  Skilled Therapeutic Interventions:  Session 1: Treatment focus on speech goals. SLP facilitated session by providing supervision question cues for problem solving with utilization of her menu to choose her meal. Pt ordered her lunch via telephone and required supervision verbal cues to utilize an increased vocal intensity. Pt recalled speech intelligibility strategies and dysphagia goals with Mod I.    Session 2: Skilled group co-treatment with OT; SLP focused on addressing dysphagia goals. SLP facilitated session with set up assist and supervision level verbal cues to self-monitor and correct left pocketing as well as left anterior loss of regular textures and thin liquids. Patient displayed no overt s/s of aspiration during meal. Recommend continuation of current plan of care.   FIM:  Comprehension Comprehension Mode: Auditory Comprehension: 5-Follows basic conversation/direction: With extra time/assistive device Expression Expression: 5-Expresses basic needs/ideas: With extra time/assistive device Social Interaction Social Interaction: 5-Interacts appropriately 90% of  the time - Needs monitoring or encouragement for participation or interaction. Problem Solving Problem Solving: 5-Solves basic 90% of the time/requires cueing < 10% of the time Memory Memory: 4-Recognizes or recalls 75 - 89% of the time/requires cueing 10 - 24% of the time  Pain Pain Assessment Pain Assessment: No/denies pain   Therapy/Group: Individual Therapy and Group Therapy  PAYNE, COURTNEY 10/12/2012, 11:04 AM

## 2012-10-12 NOTE — Progress Notes (Signed)
Patient ID: Stephanie Mason, female   DOB: 03-11-1951, 62 y.o.   MRN: [id number] Stephanie Mason is a 62 y.o. 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 Admitted 09/15/2012 to [facility] with left-sided weakness. Patient with noted seizure in the ED and received Ativan as well as loaded with Keppra. MRI of the brain showed large acute partially hemorrhagic right hemispheric infarct superimposed upon remote infarct. Patient did receive TPA. Echocardiogram with normal left ventricular function no source of embolus. EEG showed no seizure activity. Carotid Dopplers with no ICA stenosis. TCD with right terminal ICA stenosis. Patient was transferred to [facility] for ongoing evaluation. Placed on aspirin therapy for stroke prophylaxis as well as subcutaneous Lovenox for DVT prophylaxis. TEE completed 09/22/2012 showing no PFO or thrombus. Maintained on a dysphagia 3 thin liquid diet. Chest x-ray with questionable mass that was ruled out with CT of the chest  Subjective/Complaints: Objective: Had leg pain over the weekend, more specifically c/o of L hip , thigh , calf pain.  Positioning seems to help it.  No recent falls Vital Signs: Blood pressure 130/69, pulse 74, temperature 98.3 F (36.8 C), temperature source Oral, resp. rate 19, height 5\' 7"  (1.702 m), weight 70.4 kg (155 lb 3.3 oz), SpO2 94.00%. No results found. No results found for this or any previous visit (from the past 72 hour(s)).   HEENT: Left lower facial droop Cardio: RRR and no extra sounds Resp: CTA B/L and unlabored GI: BS positive and non tender Extremity:  Pulses positive and No Edema Skin:   Intact and Other no pressure areas Neuro: Lethargic, Flat, Cranial Nerve Abnormalities L central VII, Abnormal Sensory Absent to LT, Proprio, Abnormal Motor 0/5 in LUE and 1/5 LLE, Abnormal FMC Ataxic/ dec FMC, Reflexes: 0, Tone:  Trace to 1 tone left upper extremity and  hip, Dysarthric and Inattention Musc/Skel:  No pain or swelling in LLE with palpation or ROM, neg Homan's Gen NAD   Assessment/Plan: 1. Functional deficits secondary to Right MCA infarct with hemorrhage which require 3+ hours per day of interdisciplinary therapy in a comprehensive inpatient rehab setting. Physiatrist is providing close team supervision and 24 hour management of active medical problems listed below. Physiatrist and rehab team continue to assess barriers to discharge/monitor patient progress toward functional and medical goals. FIM: FIM - Bathing Bathing Steps Patient Completed: Chest;Left Arm;Abdomen;Right upper leg;Left upper leg;Right lower leg (including foot);Left lower leg (including foot);Front perineal area;Buttocks Bathing: 4: Min-Patient completes 8-9 25f 10 parts or 75+ percent (Max assist with dynamic standing)  FIM - Upper Body Dressing/Undressing Upper body dressing/undressing steps patient completed: Thread/unthread right sleeve of pullover shirt/dresss;Put head through opening of pull over shirt/dress;Pull shirt over trunk Upper body dressing/undressing: 4: Min-Patient completed 75 plus % of tasks FIM - Lower Body Dressing/Undressing Lower body dressing/undressing steps patient completed: Thread/unthread right pants leg;Don/Doff right shoe;Thread/unthread left pants leg;Pull pants up/down;Don/Doff right sock;Don/Doff left sock Lower body dressing/undressing: 3: Mod-Patient completed 50-74% of tasks (mod-max assist with stand)  FIM - Toileting Toileting steps completed by patient: Adjust clothing prior to toileting;Performs perineal hygiene Toileting Assistive Devices: Grab bar or rail for support Toileting: 3: Mod-Patient completed 2 of 3 steps  FIM - Diplomatic Services operational officer Devices: Grab bars Toilet Transfers: 3-To toilet/BSC: Mod A (lift or lower assist);3-From toilet/BSC: Mod A (lift or lower assist)  FIM - Bed/Chair  Transfer Bed/Chair Transfer Assistive Devices: Arm rests;Bed rails Bed/Chair Transfer: 3: Chair or  W/C > Bed: Mod A (lift or lower assist);3: Bed > Chair or W/C: Mod A (lift or lower assist)  FIM - Locomotion: Wheelchair Distance: 155 Locomotion: Wheelchair: 5: Travels 150 ft or more: maneuvers on rugs and over door sills with supervision, cueing or coaxing FIM - Locomotion: Ambulation Locomotion: Ambulation Assistive Devices: Chief Operating Officer Ambulation/Gait Assistance: 2: Max assist Locomotion: Ambulation: 1: Travels less than 50 ft with maximal assistance (Pt: 25 - 49%)  Comprehension Comprehension Mode: Auditory Comprehension: 5-Follows basic conversation/direction: With extra time/assistive device  Expression Expression Mode: Verbal Expression: 5-Expresses basic needs/ideas: With no assist  Social Interaction Social Interaction Mode: Asleep Social Interaction: 5-Interacts appropriately 90% of the time - Needs monitoring or encouragement for participation or interaction.  Problem Solving Problem Solving Mode: Not assessed Problem Solving: 5-Solves basic 90% of the time/requires cueing < 10% of the time  Memory Memory Mode: Asleep Memory: 5-Recognizes or recalls 90% of the time/requires cueing < 10% of the time  Medical Problem List and Plan:  1. Partially hemorrhagic right MCA infarct superimposed upon remote right brain infarct with dense left hemiparesthesias and visual spatial deficits , developing intermittent painful spasms  Zanaflex, trial gabapentin for painful parasthesias. Added muscle rub also to above regimen 2. DVT Prophylaxis/Anticoagulation: Subcutaneous Lovenox.no signas of DVT , if LLE pain progresses or has swelling, will order duplex Monitor platelet counts and any signs of bleeding  3. Pain Management: Ultram as needed. Left ankle sprain, Ice and air cast 4. Neuropsych: This patient is not capable of making decisions on his/her own behalf.  5. Seizure  disorder. Keppra 500 mg every 12 hours. EEG negative  6. Hypertension. Avapro 150 mg daily. Monitor with increased activity 7.  Dysphagia, pocketing soft textures and staff supervision 8.  Macrocytosis with borderline low RBCs MMA nl, RBC folate nl, admits to 1 beer a day at home-monitor 9.  Poor attention, ritalin trial with some benefit, increase dose, add SSRI for longer term use 10.  Urinary incont mainly urgency, trial ditropan at noc LOS (Days) 20 A FACE TO FACE EVALUATION WAS PERFORMED  KIRSTEINS,ANDREW E 10/12/2012, 9:51 AM

## 2012-10-12 NOTE — Progress Notes (Signed)
Physical Therapy Session Note  Patient Details  Name: Stephanie Mason MRN: [id number] Date of Birth: February 12, 1951  Today's Date: 10/12/2012 Time: 6578-4696 Time Calculation (min): 67 min  Short Term Goals: Week 3:  PT Short Term Goal 1 (Week 3): Patient will perform squat pivot transfers with min assist. PT Short Term Goal 2 (Week 3): Patient will perform bed mobility with min assist PT Short Term Goal 3 (Week 3): Patient will perform dynamic sitting activities reaching outside of BOS with supervision PT Short Term Goal 4 (Week 3): Patient will ambulate 92' with LRAD and mod assist.  Skilled Therapeutic Interventions/Progress Updates:    Patient received sitting in wheelchair. Today's session focused on postural control in standing, mat mobility, and L LE stretching. Patient initially denies pain, however, c/o pain and "tightness or muscle pulling" in L LE (adductor and hamstring regions) upon perform sit>stand and prolonged standing. Attempted stair activity in which patient attempted to tap 4 inch stair with R LE to facilitate increased weight bearing on L LE and weight shift to the L side. However, upon prolonged standing, patient requires manual facilitation for placement of L foot on ground (L knee maintaining in L knee flexion while standing) and for weight bearing in L LE. Patient returned to sitting and prolonged adductor and hamstring stretching performed. Patient repeatedly c/o pain with stretching and cannot tolerate hip flexion of 90 degrees with total knee extension. Continue to progress stretching in sitting and patient becomes more tolerable to stretches, but continues to c/o pain and discomfort with stretching, especially when stretch is intensified.  Attempted standing again, but patient cannot tolerate and c/o pain. Patient performed squat pivot transfer wheelchair to mat with min assist and requires assistance for placement/positioning of L LE and weight bearing through L LE during  transfer. Patient requires min assist for sit>supine for L LE management. Passive and prolonged stretching of L hamstrings, adductors, and hip IR/ER. Patient cannot tolerate and c/o pain with total knee extension in supine on mat, with hamstring and adductor stretching with total knee extension and partial straight leg raise, hooklying hip adductor stretch, and with passive ROM hip IR. Pain ranges from L hip to L hamstrings and adductors. Appearing to be inconsistent pain patterns, but noted significant increase in tone in L adductors and L hamstrings.  Patient returned to room and switched L leg rest to elevating leg rest to promote L knee extension/stretch. Pillow positioned between knees to promote improved hip alignment/decreased IR. RN notified of all of patient's pain issues throughout session and increased tone. Recommended abductor wedge in bed tonight secondary to patient's c/o discomfort/positioning issues while sleeping.  Patient returned to room and left seated in wheelchair with seatbelt donned and all needs within reach, RN present.  Therapy Documentation Precautions:  Precautions Precautions: Fall Precaution Comments: dense L hemiplegia, left neglect, L air cast as of 2/27 Restrictions Weight Bearing Restrictions: No Pain: Pain Assessment Pain Assessment: No/denies pain Locomotion : Wheelchair Mobility Distance: 155   See FIM for current functional status  Therapy/Group: Individual Therapy  Chipper Herb. Genna Casimir, PT, DPT  10/12/2012, 3:59 PM

## 2012-10-12 NOTE — Progress Notes (Signed)
Occupational Therapy Note  Patient Details  Name: Shatyra Becka MRN: [id number] Date of Birth: [birth year] Today's Date: 10/12/2012  Time: 1150-1210 (cotx with Speech Therapy-total time 1130-1210) Pt denies pain Group Therapy Pt participated in self feeding group with focus on adhering to swallowing strategies, attention to left, and attention to task.  Pt required min verbal cues to check for pocketing in left oral cavity.  Pt incorporating appropriate techniques/strategies for self feeding throughout session.   Lavone Neri [facility] 10/12/2012, 3:40 PM

## 2012-10-12 NOTE — Consult Note (Signed)
10/08/12  PSYCHOSOCIAL NOTE - CONFIDENTIAL  [facility]   [name] is a 62 year old, right-handed, African-American female. She was previously seen but this Stephanie Mason on 10/02/12 to provide with feedback regarding her cognitive testing. Today she was seen to provide ego support and supportive psychotherapy.   Upon arrival, Mrs. [name] affect was totally different from the last few times she was seen. Her affect was appropriately modulated, as opposed to flat. Her mood was euthymic and bright. She denied experiencing any significant signs of clinical psychopathology including depression or anxiety-related symptoms. Her energy level has also reportedly increased. She continues to feel that she is making strides in therapy and looks forward to her discharge date. Her social support system remains good.    At this time, Mrs. [name] mood has seemingly significantly improved. The plan will be to continue to routinely follow-up with her throughout her admission and provide support as needed. Also, if deemed necessary, repeat cognitive testing may be scheduled before her discharge to assess for interval change, particularly given her improved mood.   REFERRING DIAGNOSIS: Stroke  FINAL DIAGNOSES:  Stroke Depressive disorder, NOS  Total professional time including medical record review and feedback equals 2 units (16109).   Debbe Mounts, Psy.D.  Clinical Neuropsychologist

## 2012-10-13 ENCOUNTER — Encounter (HOSPITAL_COMMUNITY): Payer: BC Managed Care – PPO | Admitting: Occupational Therapy

## 2012-10-13 ENCOUNTER — Inpatient Hospital Stay (HOSPITAL_COMMUNITY): Payer: BC Managed Care – PPO | Admitting: *Deleted

## 2012-10-13 ENCOUNTER — Inpatient Hospital Stay (HOSPITAL_COMMUNITY): Payer: BC Managed Care – PPO | Admitting: Speech Pathology

## 2012-10-13 DIAGNOSIS — I69998 Other sequelae following unspecified cerebrovascular disease: Secondary | ICD-10-CM

## 2012-10-13 DIAGNOSIS — I6330 Cerebral infarction due to thrombosis of unspecified cerebral artery: Secondary | ICD-10-CM

## 2012-10-13 DIAGNOSIS — R209 Unspecified disturbances of skin sensation: Secondary | ICD-10-CM

## 2012-10-13 DIAGNOSIS — G8110 Spastic hemiplegia affecting unspecified side: Secondary | ICD-10-CM

## 2012-10-13 LAB — CREATININE, SERUM
Creatinine, Ser: 0.79 mg/dL (ref 0.50–1.10)
GFR calc Af Amer: >90 mL/min (ref >90)
GFR calc non Af Amer: 89 mL/min — ABNORMAL LOW (ref >90)

## 2012-10-13 NOTE — Progress Notes (Signed)
Occupational Therapy Weekly Progress Note  Patient Details  Name: Stephanie Mason MRN: [id number] Date of Birth: 08-Aug-1951  Today's Date: 10/13/2012 Time: 0900-1000 Time Calculation (min): 60 min  Patient has met 5 of 5 short term goals.  Patient making good progress toward LTGs and continues to be limited by significant LLE & LUE pain as well as hemiplegia with abnormal tone, unbalanced muscle activation, impaired timing and sequencing left visual field cut and decreased attention to left side of body, decreased dynamic sitting and standing balance and therefore will continue to benefit from skilled OT intervention to enhance overall performance with BADL, iADL and Reduce care partner burden.  Patient progressing toward long term goals..  Continue plan of care.  OT Short Term Goals Week 2:  OT Short Term Goal 1 (Week 2): UB Dressing:  Min assist with pull over shirt with min instructional cues to follow hemi techniques OT Short Term Goal 1 - Progress (Week 2): Met OT Short Term Goal 2 (Week 2): Dynamic Standing Balance:  Mod assist with standing during LB bath, LB dressing and toileting tasks OT Short Term Goal 2 - Progress (Week 2): Met OT Short Term Goal 3 (Week 2): Toilet Transfer:  Mod assist with stand pivot transfer to and from toilet OT Short Term Goal 3 - Progress (Week 2): Met OT Short Term Goal 4 (Week 2): LUE Management:  Mod vcs for management and positioning of LUE during all BADL tasks, and functional mobility.  OT Short Term Goal 4 - Progress (Week 2): Met OT Short Term Goal 5 (Week 2): Vision:  Patient will locate items/obstacles on her left with no more than mod questioning cues. OT Short Term Goal 5 - Progress (Week 2): Met Week 3:  OT Short Term Goal 1 (Week 3): STG=LTGs of overall Min assist - Supervision  Skilled Therapeutic Interventions/Progress Updates:  Self care retraining to include toilet transfer, toileting, bath, dress and groom.. Focus session on adapting  mobility due to pain in LLE and occasional pain in LUE, bed mobility techniques, safe transfers, hemi dressing techniques, dynamic standing balance, attention to left UE.  Therapy Documentation Precautions:  Precautions Precautions: Fall Precaution Comments: dense L hemiplegia, left neglect, L air cast as of 2/27 Restrictions Weight Bearing Restrictions: No Pain: C/o LLE significant pain with all functional mobility during bed mobility and with BADL tasks.  Rest, reposition and adapt session to accommodate pain.  Patient was premedicated and did not rate her pain. ADL: See FIM for current functional status  Therapy/Group: Individual Therapy  SHAFFER, CHRISTINA 10/13/2012, 12:20 PM

## 2012-10-13 NOTE — Progress Notes (Signed)
Speech Language Pathology Daily Session Notes  Patient Details  Name: Stephanie Mason MRN: [id number] Date of Birth: [birth year]  Today's Date: 10/13/2012  Session 1 Time: 1100-1130 Time Calculation (min): 30 min  Session 2 Time: 4098-1191 Time Calculation: 45 min   Short Term Goals: Week 3: SLP Short Term Goal 1 (Week 3): Patient will consume regular textures and thin liquids with supervision verbal cues to utilize safe swallow compensatory strategies. SLP Short Term Goal 2 (Week 3): Patient will attend to left upper extremity and environment with supervision level verbal cues. SLP Short Term Goal 3 (Week 3): Patient will request help as needed with various tasks with min assist question cues. SLP Short Term Goal 4 (Week 3): Patient will perform diaphragmatic breathing exercises with mod assist verbal and visual cues.  Skilled Therapeutic Interventions:  Session 1: Treatment focus on cognitive goals. SLP facilitated session by providing Max A demonstration and visual cues to self-monitor and correct errors with visuospatial tasks. Pt recalled 2/5 unrelated words after 5 minute delay and required semantic cues and multiple choice to recall remaining 3 words. Pt also required total A for basic mathematical problem solving task.    Session 2: Skilled group co-treatment with OT; SLP focused on addressing dysphagia goals. SLP facilitated session with set up assist. Pt self-monitored and corrected left buccal pocketing and left anterior loss of regular textures and thin liquids with Mod I. Patient displayed no overt s/s of aspiration during meal. Recommend pt's plan of care change to intermittent supervision with meals.   FIM:  Comprehension Comprehension Mode: Auditory Comprehension: 5-Follows basic conversation/direction: With extra time/assistive device Expression Expression Mode: Verbal Expression: 5-Expresses basic needs/ideas: With extra time/assistive device Social Interaction Social  Interaction: 5-Interacts appropriately 90% of the time - Needs monitoring or encouragement for participation or interaction. Problem Solving Problem Solving: 5-Solves basic 90% of the time/requires cueing < 10% of the time Memory Memory: 3-Recognizes or recalls 50 - 74% of the time/requires cueing 25 - 49% of the time  Pain Pain Assessment Pain Assessment: No/denies pain Pain Score: 0-No pain  Therapy/Group: Individual Therapy and Group Therapy  PAYNE, COURTNEY 10/13/2012, 3:15 PM

## 2012-10-13 NOTE — Progress Notes (Signed)
Patient ID: Stephanie Mason, female   DOB: [birth year], 62 y.o.   MRN: [id number] Stephanie Mason is a 62 y.o. 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 Admitted 09/15/2012 to [facility] with left-sided weakness. Patient with noted seizure in the ED and received Ativan as well as loaded with Keppra. MRI of the brain showed large acute partially hemorrhagic right hemispheric infarct superimposed upon remote infarct. Patient did receive TPA. Echocardiogram with normal left ventricular function no source of embolus. EEG showed no seizure activity. Carotid Dopplers with no ICA stenosis. TCD with right terminal ICA stenosis. Patient was transferred to [facility] for ongoing evaluation. Placed on aspirin therapy for stroke prophylaxis as well as subcutaneous Lovenox for DVT prophylaxis. TEE completed 09/22/2012 showing no PFO or thrombus. Maintained on a dysphagia 3 thin liquid diet. Chest x-ray with questionable mass that was ruled out with CT of the chest  Subjective/Complaints: Objective: Slept well , no pain at noc.  No nocturia Review of Systems  Neurological: Positive for sensory change and focal weakness. Negative for seizures.  All other systems reviewed and are negative.     Vital Signs: Blood pressure 110/68, pulse 61, temperature 98.3 F (36.8 C), temperature source Oral, resp. rate 19, height 5\' 7"  (1.702 m), weight 70.4 kg (155 lb 3.3 oz), SpO2 96.00%. No results found. Results for orders placed during the hospital encounter of 09/22/12 (from the past 72 hour(s))  CREATININE, SERUM     Status: Abnormal   Collection Time    10/13/12  7:15 AM      Result Value Range   Creatinine, Ser 0.79  0.50 - 1.10 mg/dL   GFR calc non Af Amer 89 (*) >90 mL/min   GFR calc Af Amer >90  >90 mL/min   Comment:            The eGFR has been calculated     using the CKD EPI equation.     This calculation has not been     validated  in all clinical     situations.     eGFR's persistently     <90 mL/min signify     possible Chronic Kidney Disease.     HEENT: Left lower facial droop Cardio: RRR and no extra sounds Resp: CTA B/L and unlabored GI: BS positive and non tender Extremity:  Pulses positive and No Edema Skin:   Intact and Other no pressure areas Neuro: Lethargic, Flat, Cranial Nerve Abnormalities L central VII, Abnormal Sensory Absent to LT, Proprio, Abnormal Motor 0/5 in LUE and 1/5 LLE, Abnormal FMC Ataxic/ dec FMC, Reflexes: 0, Tone:  Trace to 1 tone left upper extremity and hip, Dysarthric and Inattention Musc/Skel:  No pain or swelling in LLE with palpation or ROM, neg Homan's Gen NAD   Assessment/Plan: 1. Functional deficits secondary to Right MCA infarct with hemorrhage which require 3+ hours per day of interdisciplinary therapy in a comprehensive inpatient rehab setting. Physiatrist is providing close team supervision and 24 hour management of active medical problems listed below. Physiatrist and rehab team continue to assess barriers to discharge/monitor patient progress toward functional and medical goals. FIM: FIM - Bathing Bathing Steps Patient Completed: Chest;Left Arm;Abdomen;Right upper leg;Left upper leg;Right lower leg (including foot);Left lower leg (including foot);Front perineal area;Buttocks Bathing: 4: Min-Patient completes 8-9 57f 10 parts or 75+ percent (Max assist with dynamic standing)  FIM - Upper Body Dressing/Undressing Upper body dressing/undressing steps patient completed: Thread/unthread right  sleeve of pullover shirt/dresss;Put head through opening of pull over shirt/dress;Pull shirt over trunk Upper body dressing/undressing: 4: Min-Patient completed 75 plus % of tasks FIM - Lower Body Dressing/Undressing Lower body dressing/undressing steps patient completed: Thread/unthread right pants leg;Don/Doff right shoe;Thread/unthread left pants leg;Pull pants up/down;Don/Doff right  sock;Don/Doff left sock Lower body dressing/undressing: 3: Mod-Patient completed 50-74% of tasks (mod-max assist with stand)  FIM - Toileting Toileting steps completed by patient: Adjust clothing prior to toileting;Performs perineal hygiene Toileting Assistive Devices: Grab bar or rail for support Toileting: 3: Mod-Patient completed 2 of 3 steps  FIM - Diplomatic Services operational officer Devices: Grab bars Toilet Transfers: 3-To toilet/BSC: Mod A (lift or lower assist);3-From toilet/BSC: Mod A (lift or lower assist)  FIM - Bed/Chair Transfer Bed/Chair Transfer Assistive Devices: Arm rests;Bed rails Bed/Chair Transfer: 3: Supine > Sit: Mod A (lifting assist/Pt. 50-74%/lift 2 legs;4: Sit > Supine: Min A (steadying pt. > 75%/lift 1 leg);4: Chair or W/C > Bed: Min A (steadying Pt. > 75%);4: Bed > Chair or W/C: Min A (steadying Pt. > 75%)  FIM - Locomotion: Wheelchair Distance: 155 Locomotion: Wheelchair: 5: Travels 150 ft or more: maneuvers on rugs and over door sills with supervision, cueing or coaxing FIM - Locomotion: Ambulation Locomotion: Ambulation Assistive Devices: Chief Operating Officer Ambulation/Gait Assistance: 2: Max assist Locomotion: Ambulation: 0: Activity did not occur  Comprehension Comprehension Mode: Auditory Comprehension: 5-Follows basic conversation/direction: With extra time/assistive device  Expression Expression Mode: Verbal Expression: 5-Expresses basic needs/ideas: With extra time/assistive device  Social Interaction Social Interaction Mode: Asleep Social Interaction: 5-Interacts appropriately 90% of the time - Needs monitoring or encouragement for participation or interaction.  Problem Solving Problem Solving Mode: Not assessed Problem Solving: 5-Solves basic 90% of the time/requires cueing < 10% of the time  Memory Memory Mode: Asleep Memory: 4-Recognizes or recalls 75 - 89% of the time/requires cueing 10 - 24% of the time  Medical Problem List and  Plan:  1. Partially hemorrhagic right MCA infarct superimposed upon remote right brain infarct with dense left hemiparesthesias and visual spatial deficits , developing intermittent painful spasms  Zanaflex, trial gabapentin for painful parasthesias. Added muscle rub also to above regimen 2. DVT Prophylaxis/Anticoagulation: Subcutaneous Lovenox.no signas of DVT , if LLE pain progresses or has swelling, will order duplex Monitor platelet counts and any signs of bleeding  3. Pain Management: Ultram as needed. Left ankle sprain, Ice and air cast 4. Neuropsych: This patient is not capable of making decisions on his/her own behalf.  5. Seizure disorder. Keppra 500 mg every 12 hours. EEG negative  6. Hypertension. Avapro 150 mg daily. Monitor with increased activity 7.  Dysphagia, pocketing soft textures and staff supervision 8.  Macrocytosis with borderline low RBCs MMA nl, RBC folate nl, admits to 1 beer a day at home-monitor 9.  Poor attention, ritalin trial with some benefit, increase dose, add SSRI for longer term use 10.  Urinary incont mainly urgency, trial ditropan at noc LOS (Days) 21 A FACE TO FACE EVALUATION WAS PERFORMED  KIRSTEINS,ANDREW E 10/13/2012, 8:47 AM

## 2012-10-13 NOTE — Progress Notes (Signed)
Social Work Patient ID: Stephanie Mason, female   DOB: February 26, 1951, 62 y.o.   MRN: [id number] Met with pt to discuss starting the process of insurance approval for going to [facility] upon discharge form rehab. She was agreeable and feels this is the best option and then go home.  She is having much pain in her leg today when in therapies. Faxed FL2 to facility to begin working on transfer and coverage.

## 2012-10-13 NOTE — Progress Notes (Signed)
Physical Therapy Session Note  Patient Details  Name: Stephanie Mason MRN: [id number] Date of Birth: [birth year]  Today's Date: 10/13/2012 Time: 1030-1100 and 1445-1535 Time Calculation (min): 30 min and 50 min  Short Term Goals: Week 3:  PT Short Term Goal 1 (Week 3): Patient will perform squat pivot transfers with min assist. PT Short Term Goal 2 (Week 3): Patient will perform bed mobility with min assist PT Short Term Goal 3 (Week 3): Patient will perform dynamic sitting activities reaching outside of BOS with supervision PT Short Term Goal 4 (Week 3): Patient will ambulate 50' with LRAD and mod assist.  Skilled Therapeutic Interventions/Progress Updates:    AM Session: Patient received sitting in wheelchair. Patient performed squat pivot transfer wheelchair to mat with mod assist and requires assistance for placement/positioning of L LE and weight bearing through L LE during transfer. Patient requires mod assist for sit>supine for L LE management. Passive and prolonged stretching of L hamstrings, adductors, and hip IR/ER. Patient cannot tolerate and c/o pain with total knee extension in supine on mat, with hamstring and adductor stretching with total knee extension and partial straight leg raise, hooklying hip adductor stretch, and with passive ROM hip IR. Pain ranges from L hip to L hamstrings and adductors. Appearing to be inconsistent pain patterns, but noted significant increase in tone in L adductors and L hamstrings.  Patient handed off to SLP with seatbelt donned and elevating L leg rest and pillow between legs to promote increased L hip abduction and ER.  PM Session: Patient received sitting in wheelchair. Passive and prolonged stretching of L hamstrings, adductors, and hip IR/ER in sitting. Patient continues with same pain complaints as this AM. Patient performed static standing with [name] x2' with mod assist. Patient demonstrates decreased weight bearing through L LE, sustained L  knee flexion, L lateral trunk lean, L shoulder and hip retraction, R shoulder elevation. Tactile cues provided to assist with improved upright posture and midline orientation.  Patient performed static standing x8' in standing frame (without harness) with min assist-stand by assistance. Patient continues to demonstrate L lateral trunk lean, L shoulder and hip retraction, R shoulder elevation, however, she also exhibits improved weight bearing through L LE and improved L knee extension secondary to use of standing frame to assist with L knee blocking. While standing in standing frame, patient performed x10 R hamstring curls to assist with weight shift to the L and L LE weight bearing; requires min assist.  Patient with requests to use the bathroom. Returned to room and performed stand pivot transfer with use of grab bar and max assist for transfer and clothing management. Patient requires +2 for hygiene and lower body dressing after toileting. Patient left seated in wheelchair with seatbelt donned, L elevating leg rest, and pillow between legs to promote increased L hip abduction and ER and all needs within reach. Nurse tech present to take vitals.   Therapy Documentation Precautions:  Precautions Precautions: Fall Precaution Comments: dense L hemiplegia, left neglect, L air cast as of 2/27 Restrictions Weight Bearing Restrictions: No Pain: Pain Assessment Pain Assessment: No/denies pain Pain Score: 0-No pain Locomotion : Wheelchair Mobility Distance: 155x2   See FIM for current functional status  Therapy/Group: Individual Therapy  Chipper Herb. Ripa, PT, DPT  10/13/2012, 1:58 PM

## 2012-10-14 ENCOUNTER — Inpatient Hospital Stay (HOSPITAL_COMMUNITY): Payer: BC Managed Care – PPO | Admitting: Speech Pathology

## 2012-10-14 ENCOUNTER — Encounter (HOSPITAL_COMMUNITY): Payer: BC Managed Care – PPO | Admitting: Occupational Therapy

## 2012-10-14 ENCOUNTER — Inpatient Hospital Stay (HOSPITAL_COMMUNITY): Payer: BC Managed Care – PPO | Admitting: *Deleted

## 2012-10-14 MED ORDER — GABAPENTIN 300 MG PO CAPS
300.0000 mg | ORAL_CAPSULE | Freq: Three times a day (TID) | ORAL | Status: DC
  Administered 2012-10-14 – 2012-10-19 (×16): 300 mg via ORAL
  Filled 2012-10-14 (×18): qty 1

## 2012-10-14 MED ORDER — TIZANIDINE HCL 4 MG PO TABS
4.0000 mg | ORAL_TABLET | Freq: Four times a day (QID) | ORAL | Status: DC
  Administered 2012-10-14 – 2012-10-19 (×21): 4 mg via ORAL
  Filled 2012-10-14 (×26): qty 1

## 2012-10-14 NOTE — Progress Notes (Signed)
Physical Therapy Session Note  Patient Details  Name: Stephanie Mason MRN: [id number] Date of Birth: 03-Dec-1950  Today's Date: 10/14/2012 Time: 0830-0930 and 7829-5621 Time Calculation (min): 60 min and 42 min  Short Term Goals: Week 3:  PT Short Term Goal 1 (Week 3): Patient will perform squat pivot transfers with min assist. PT Short Term Goal 2 (Week 3): Patient will perform bed mobility with min assist PT Short Term Goal 3 (Week 3): Patient will perform dynamic sitting activities reaching outside of BOS with supervision PT Short Term Goal 4 (Week 3): Patient will ambulate 15' with LRAD and mod assist.  Skilled Therapeutic Interventions/Progress Updates:    AM Session: Patient received supine in bed. Today's session focused on NMR of L LE and trunk/core. Patient requires total assistance to don gown on backside and donning of socks, L air cast, and shoes. Patient instructed in wheelchair mobility 155' x1 with supervision, but continues to require verbal cues for negotiation of obstacles on the L as well as awareness/attention to L foot rest to prevent collision with walls or other objects.  Patient performed sit<>stands, 2 sets x10, from elevated mat with emphasis on anterior weight shift and weight bearing through L LE as patient tends to only rely on R LE when performing sit<>stands. Also emphasized slow, controlled descent for improved eccentric control of B quads.  Patient performed x10 stair taps with R LE and R railing for UE support to facilitate increased weight bearing through L LE and improved weight shift to the L. Patient did not demonstrate any L knee buckling, but does exhibit L knee flexion with decreased ability to perform L TKE. Performed x3 more stair taps, but exercise discontinued secondary to c/o L thigh pain.  Patient returned to room and left seated in wheelchair with seatbelt donned and all needs within reach.  PM Session: Patient received sitting in wheelchair. This  session focused on postural control in standing and pre-gait activities. Patient instructed in wheelchair mobility 155' x1 with supervision, but continues to require verbal cues for negotiation of obstacles on the L as well as awareness/attention to L foot rest to prevent collision with walls or other objects. Patient performed sit>stand from wheelchair with Carley Hammed walker and requires max assist for postural control, L knee stabilization, upright posture, midline orientation, and Psychologist, forensic. Patient exhibits fatigue with standing and requests to sit.  Patient performed static standing with R handrail and max assist required for L knee stabilization, upright posture and midline orientation. After 30" of static standing, attempted to initiate lateral weight shifts to assist with increased weight bearing in L LE, however patient c/o pain and states she cannot stand anymore. Patient reports fatigue and demonstrates difficulty maintaining eyes open.  Patient returned to room and left seated in wheelchair with seatbelt donned and all needs within reach.  Therapy Documentation Precautions:  Precautions Precautions: Fall Precaution Comments: dense L hemiplegia, left neglect, L air cast as of 2/27 Restrictions Weight Bearing Restrictions: No Pain: Pain Assessment Pain Assessment: 0-10 Pain Score:   4 Pain Type: Acute pain Pain Location: Hand Pain Orientation: Left Pain Descriptors: Aching Pain Onset: Gradual Pain Intervention(s): Ambulation/increased activity;Therapeutic touch;Repositioned Multiple Pain Sites: No Locomotion : Wheelchair Mobility Distance: 155 x2   See FIM for current functional status  Therapy/Group: Individual Therapy  Chipper Herb. Ripa, PT, DPT  10/14/2012, 10:41 AM

## 2012-10-14 NOTE — Progress Notes (Signed)
Occupational Therapy Session Note  Patient Details  Name: Stephanie Mason MRN: [id number] Date of Birth: 1951/06/08  Today's Date: 10/14/2012 Time: 0800-0900 Time Calculation (min): 60 min  OT Short Term Goals Week 3:  OT Short Term Goal 1 (Week 3): STG=LTGs of overall Min assist - Supervision   Skilled Therapeutic Interventions/Progress Updates:  Self care retraining to include sponge bath, dress, transfer and groom.  Focus session on LUE weight bearing during anterior and left lateral weight shifts during BADL, attention to left, hemi dressing techniques, sit><stand, and dynamic standing balance,. Patient continues to require min verbal and facilitation cues to maintain  anterior weight shifts during scooting, sit><stand.    Therapy Documentation Precautions:  Precautions Precautions: Fall Precaution Comments: dense L hemiplegia, left neglect, L air cast as of 2/27 Restrictions Weight Bearing Restrictions: No Pain: Denies pain except back of knee during standing and knee in extentions ADL: See FIM for current functional status  Therapy/Group: Individual Therapy  SHAFFER, CHRISTINA 10/14/2012, 12:28 PM

## 2012-10-14 NOTE — Progress Notes (Signed)
Speech Language Pathology Daily Session Note  Patient Details  Name: Stephanie Mason MRN: [id number] Date of Birth: October 13, 1950  Today's Date: 10/14/2012 Time: 1530-1600 Time Calculation (min): 30 min  Short Term Goals: Week 3: SLP Short Term Goal 1 (Week 3): Patient will consume regular textures and thin liquids with supervision verbal cues to utilize safe swallow compensatory strategies. SLP Short Term Goal 2 (Week 3): Patient will attend to left upper extremity and environment with supervision level verbal cues. SLP Short Term Goal 3 (Week 3): Patient will request help as needed with various tasks with min assist question cues. SLP Short Term Goal 4 (Week 3): Patient will perform diaphragmatic breathing exercises with mod assist verbal and visual cues.  Skilled Therapeutic Interventions: Skilled treatment session focused on addressing cognitive goals. SLP facilitated session by providing max assist demonstration and visual cues to self-monitor and correct errors with visuospatial tasks by alternating attention between different objects, especially to the left. Patient was able to locate 2 similar objects but not 3 without clinician cues.    FIM:  Comprehension Comprehension Mode: Auditory Comprehension: 5-Follows basic conversation/direction: With extra time/assistive device Expression Expression Mode: Verbal Expression: 5-Expresses basic needs/ideas: With extra time/assistive device Social Interaction Social Interaction: 5-Interacts appropriately 90% of the time - Needs monitoring or encouragement for participation or interaction. Problem Solving Problem Solving: 5-Solves basic 90% of the time/requires cueing < 10% of the time Memory Memory: 3-Recognizes or recalls 50 - 74% of the time/requires cueing 25 - 49% of the time  Pain Pain Assessment Pain Assessment: No/denies pain  Therapy/Group: Individual Therapy  Charlane Ferretti., CCC-SLP [phone]  BOWIE,MELISSA 10/14/2012,  4:23 PM

## 2012-10-14 NOTE — Patient Care Conference (Signed)
Inpatient RehabilitationTeam Conference and Plan of Care Update Date: 10/14/2012   Time: 11:10 AM    Patient Name: Stephanie Mason      Medical Record Number: [id number]  Date of Birth: 12-22-1950 Sex: Female         Room/Bed: 4034/4034-01 Payor Info: Payor: BLUE CROSS BLUE SHIELD  Plan: BCBS PPO OUT OF STATE  Product Type: *No Product type*     Admitting Diagnosis: rt cva  Admit Date/Time:  09/22/2012  4:07 PM Admission Comments: No comment available   Primary Diagnosis:  CVA (cerebral infarction) Principal Problem: CVA (cerebral infarction)  Patient Active Problem List   Diagnosis Date Noted  . Left hand pain 10/10/2012  . CVA (cerebral infarction) 09/22/2012  . 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 Hemiplegia, unspecified, affecting nondominant side 09/15/2012  . Cerebral embolism with cerebral infarction 09/15/2012  . Other convulsions 09/15/2012    Expected Discharge Date: Expected Discharge Date: 10/20/12  Team Members Present: Physician leading conference: Dr. Claudette Laws Social Worker Present: Dossie Der, LCSW Nurse Present: Gregor Hams, RN PT Present: Edman Circle, PT;Caroline Adriana Simas, PT;Other (comment) Clarisse Gouge Ripa-PT) OT Present: Bretta Bang, Verlene Mayer, OT SLP Present: [name], SLP Other (Discipline and Name): Charolette Child Coordinator     Current Status/Progress Goal Weekly Team Focus  Medical   Increasing tone left upper and left lower extremity, left sided dysesthetic pain  Improve tone, improved pain on left  Anti-spasticity medications, adjust gabapentin   Bowel/Bladder   pt can be incontinent of bladder at times during the night.   continent with mod assist  continue to time toilet pt.   Swallow/Nutrition/ Hydration   regular textures and thin liquids with intermittent supervision   least restrictive p.o. intake   carryover of strategies, self monitoring and correcting   ADL's   Supervision UB Dressing, Min bathing, Mod LB  Dressing, toilet transfers and toileting, Mod-Max with dynamic staing balance  min assist with bathing, LB dressing, toileting, toilet transfers, tub bench transfers; supervision UB dressing,  Pain management of LLE & LUE, attention to left visual field and left side of body, postural control in sitting and standing, midline orientation, dynamic sitting and standing balance, safe transfers   Mobility   mod A bed mobility from flat surface, min-mod for sit<>stand, min-mod for squat pivot transfers when weight bearing through L LE provided, max A gait x25' with R handrail, max-total for 1 step with R handrail  Goals downgraded to min A overall except mod A for gait and stairs and S for w/c mobility  pain management (L hip, thigh, ankle), tone management, postural control in standing, weight bearing activities for L LE, w/c mobility, activity tolerance, transfers, bed mobility   Communication   supervision-ModI   supervision  self monitoring and correcting of vocal intensity   Safety/Cognition/ Behavioral Observations  supervision with basic and mod assist with complex  min assist   increase lef attention, self monitoring and correcting   Pain   pt has had c/o left shoulder, neck, arm and hand pain.pt has Ultram 100mg  po q 6hrs as needed. Tylenol 650mg  po q 4 hrs as needed for pain.   less than equal to 3.  will continue to monitor/assess for pain q shift will medicate as needed.    Skin   CDI   will remain free of skin breakdown/infection.  will continue to assess q shift.      *See Interdisciplinary Assessment and Plan and progress notes for long and Kellene Mccleary-term goals  Barriers to Discharge: See above still requiring heavy physical sit    Possible Resolutions to Barriers:  Identified 24 7 caregiver, see above    Discharge Planning/Teaching Needs:  Plan now is to go to [facility]-facility she worked-at discharge, to continue her progress, then home      Team Discussion:  Tone and  pain limiting therapies.  MD increased pain meds.  Slow progress.  L-visual field and attention improving.  Increased self monitoring  Revisions to Treatment Plan:  Plan now NHP then home-pursuing insurance coverage   Continued Need for Acute Rehabilitation Level of Care: The patient requires daily medical management by a physician with specialized training in physical medicine and rehabilitation for the following conditions: Daily direction of a multidisciplinary physical rehabilitation program to ensure safe treatment while eliciting the highest outcome that is of practical value to the patient.: Yes Daily medical management of patient stability for increased activity during participation in an intensive rehabilitation regime.: Yes Daily analysis of laboratory values and/or radiology reports with any subsequent need for medication adjustment of medical intervention for : Neurological problems  Dupree, Lemar Livings 10/14/2012, 12:15 PM

## 2012-10-14 NOTE — Progress Notes (Signed)
Patient ID: Stephanie Mason, female   DOB: [birth year], 62 y.o.   MRN: [id number] Cassi Jenne is a 62 y.o. 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 Admitted 09/15/2012 to [facility] with left-sided weakness. Patient with noted seizure in the ED and received Ativan as well as loaded with Keppra. MRI of the brain showed large acute partially hemorrhagic right hemispheric infarct superimposed upon remote infarct. Patient did receive TPA. Echocardiogram with normal left ventricular function no source of embolus. EEG showed no seizure activity. Carotid Dopplers with no ICA stenosis. TCD with right terminal ICA stenosis. Patient was transferred to [facility] for ongoing evaluation. Placed on aspirin therapy for stroke prophylaxis as well as subcutaneous Lovenox for DVT prophylaxis. TEE completed 09/22/2012 showing no PFO or thrombus. Maintained on a dysphagia 3 thin liquid diet. Chest x-ray with questionable mass that was ruled out with CT of the chest  Subjective/Complaints: Objective: PT notes increasing tone in LLE hamstrings calf and hip adductors Pt c/o Left Elbow pain with ROM, PT notes shoulder pain when pt positioned of Left side  Review of Systems  Neurological: Positive for sensory change and focal weakness. Negative for seizures.  All other systems reviewed and are negative.     Vital Signs: Blood pressure 121/71, pulse 62, temperature 98.5 F (36.9 C), temperature source Oral, resp. rate 19, height 5\' 7"  (1.702 m), weight 70.4 kg (155 lb 3.3 oz), SpO2 93.00%. No results found. Results for orders placed during the hospital encounter of 09/22/12 (from the past 72 hour(s))  CREATININE, SERUM     Status: Abnormal   Collection Time    10/13/12  7:15 AM      Result Value Range   Creatinine, Ser 0.79  0.50 - 1.10 mg/dL   GFR calc non Af Amer 89 (*) >90 mL/min   GFR calc Af Amer >90  >90 mL/min   Comment:           The eGFR has been calculated     using the CKD EPI equation.     This calculation has not been     validated in all clinical     situations.     eGFR's persistently     <90 mL/min signify     possible Chronic Kidney Disease.     HEENT: Left lower facial droop Cardio: RRR and no extra sounds Resp: CTA B/L and unlabored GI: BS positive and non tender Extremity:  Pulses positive and No Edema Skin:   Intact and Other no pressure areas Neuro: Lethargic, Flat, Cranial Nerve Abnormalities L central VII, Abnormal Sensory Absent to LT, Proprio, Abnormal Motor 0/5 in LUE and 1/5 LLE, Abnormal FMC Ataxic/ dec FMC, Reflexes: 0, Tone:  Asworth 3  tone left  Hip adductor , hamstrings, Ashworth 2 in calf and L elbow flexors, Dysarthric and Inattention Musc/Skel:  No pain or swelling in LLE with palpation or ROM, neg Homan's Gen NAD   Assessment/Plan: 1. Functional deficits secondary to Right MCA infarct with hemorrhage which require 3+ hours per day of interdisciplinary therapy in a comprehensive inpatient rehab setting. Physiatrist is providing close team supervision and 24 hour management of active medical problems listed below. Physiatrist and rehab team continue to assess barriers to discharge/monitor patient progress toward functional and medical goals. Team conf 3/5 see note FIM: FIM - Bathing Bathing Steps Patient Completed: Chest;Left Arm;Abdomen;Right upper leg;Left upper leg;Right lower leg (including foot);Left lower leg (including foot);Front perineal  area;Buttocks Bathing: 4: Min-Patient completes 8-9 8f 10 parts or 75+ percent  FIM - Upper Body Dressing/Undressing Upper body dressing/undressing steps patient completed: Thread/unthread right sleeve of pullover shirt/dresss;Put head through opening of pull over shirt/dress;Pull shirt over trunk;Thread/unthread left sleeve of pullover shirt/dress Upper body dressing/undressing: 5: Supervision: Safety issues/verbal cues FIM - Lower Body  Dressing/Undressing Lower body dressing/undressing steps patient completed: Thread/unthread right pants leg;Don/Doff right shoe;Thread/unthread left pants leg;Pull pants up/down Lower body dressing/undressing: 3: Mod-Patient completed 50-74% of tasks (mod-min assist for dynamic standing)  FIM - Toileting Toileting steps completed by patient: Adjust clothing prior to toileting;Performs perineal hygiene;Adjust clothing after toileting Toileting Assistive Devices: Grab bar or rail for support Toileting: 4: Steadying assist  FIM - Diplomatic Services operational officer Devices: Grab bars Toilet Transfers: 3-To toilet/BSC: Mod A (lift or lower assist);3-From toilet/BSC: Mod A (lift or lower assist)  FIM - Bed/Chair Transfer Bed/Chair Transfer Assistive Devices: Arm rests Bed/Chair Transfer: 3: Sit > Supine: Mod A (lifting assist/Pt. 50-74%/lift 2 legs);3: Supine > Sit: Mod A (lifting assist/Pt. 50-74%/lift 2 legs;3: Bed > Chair or W/C: Mod A (lift or lower assist);3: Chair or W/C > Bed: Mod A (lift or lower assist)  FIM - Locomotion: Wheelchair Distance: 155 Locomotion: Wheelchair: 5: Travels 150 ft or more: maneuvers on rugs and over door sills with supervision, cueing or coaxing FIM - Locomotion: Ambulation Locomotion: Ambulation Assistive Devices: Chief Operating Officer Ambulation/Gait Assistance: 2: Max assist Locomotion: Ambulation: 0: Activity did not occur  Comprehension Comprehension Mode: Auditory Comprehension: 5-Follows basic conversation/direction: With extra time/assistive device  Expression Expression Mode: Verbal Expression: 5-Expresses basic needs/ideas: With extra time/assistive device  Social Interaction Social Interaction Mode: Asleep Social Interaction: 5-Interacts appropriately 90% of the time - Needs monitoring or encouragement for participation or interaction.  Problem Solving Problem Solving Mode: Not assessed Problem Solving: 5-Solves basic 90% of the  time/requires cueing < 10% of the time  Memory Memory Mode: Asleep Memory: 3-Recognizes or recalls 50 - 74% of the time/requires cueing 25 - 49% of the time  Medical Problem List and Plan:  1. Partially hemorrhagic right MCA infarct superimposed upon remote right brain infarct with dense left hemiparesthesias and visual spatial deficits , developing intermittent painful spasmsTitrate  Zanaflex, titrate gabapentin for painful parasthesias. Added muscle rub also to above regimen 2. DVT Prophylaxis/Anticoagulation: Subcutaneous Lovenox.no signas of DVT ,Monitor platelet counts and any signs of bleeding  3. Pain Management: Ultram as needed. Left ankle sprain, Ice and air cast 4. Neuropsych: This patient is not capable of making decisions on his/her own behalf.  5. Seizure disorder. Keppra 500 mg every 12 hours. EEG negative  6. Hypertension. Avapro 150 mg daily. Monitor with increased activity 7.  Dysphagia, pocketing soft textures and staff supervision 8.  Macrocytosis with borderline low RBCs MMA nl, RBC folate nl, admits to 1 beer a day at home-monitor 9.  Poor attention, ritalin trial with some benefit, increase dose, add SSRI for longer term use 10.  Urinary incont mainly urgency, trial ditropan at noc LOS (Days) 22 A FACE TO FACE EVALUATION WAS PERFORMED  Timmothy Baranowski E 10/14/2012, 8:39 AM

## 2012-10-15 ENCOUNTER — Inpatient Hospital Stay (HOSPITAL_COMMUNITY): Payer: BC Managed Care – PPO | Admitting: *Deleted

## 2012-10-15 ENCOUNTER — Encounter (HOSPITAL_COMMUNITY): Payer: BC Managed Care – PPO | Admitting: Occupational Therapy

## 2012-10-15 ENCOUNTER — Inpatient Hospital Stay (HOSPITAL_COMMUNITY): Payer: BC Managed Care – PPO | Admitting: Speech Pathology

## 2012-10-15 DIAGNOSIS — I6330 Cerebral infarction due to thrombosis of unspecified cerebral artery: Secondary | ICD-10-CM

## 2012-10-15 DIAGNOSIS — R209 Unspecified disturbances of skin sensation: Secondary | ICD-10-CM

## 2012-10-15 DIAGNOSIS — G8110 Spastic hemiplegia affecting unspecified side: Secondary | ICD-10-CM

## 2012-10-15 DIAGNOSIS — I69998 Other sequelae following unspecified cerebrovascular disease: Secondary | ICD-10-CM

## 2012-10-15 NOTE — Progress Notes (Signed)
Social Work Patient ID: Stephanie Mason, female   DOB: 08-09-1951, 62 y.o.   MRN: [id number] Spoke with [employer] to inform pt is needing NHP at discharge.  Have contacted [facility] to inform her they may go ahead with authorization for admission. Victorino Dike reports insurance needed to be notified from the hospital regarding change in discharge plan.  Victorino Dike will begin working on pre-authorization for admission, which could take a few days. Pt is aware and agreeable.

## 2012-10-15 NOTE — Progress Notes (Signed)
Speech Language Pathology Weekly Progress Note  Patient Details  Name: Ireene Ballowe MRN: [id number] Date of Birth: 11/08/1950  Today's Date: 10/15/2012  Short Term Goals: Week 3: SLP Short Term Goal 1 (Week 3): Patient will consume regular textures and thin liquids with supervision verbal cues to utilize safe swallow compensatory strategies. SLP Short Term Goal 1 - Progress (Week 3): Met SLP Short Term Goal 2 (Week 3): Patient will attend to left upper extremity and environment with supervision level verbal cues. SLP Short Term Goal 2 - Progress (Week 3): Progressing toward goal SLP Short Term Goal 3 (Week 3): Patient will request help as needed with various tasks with min assist question cues. SLP Short Term Goal 3 - Progress (Week 3): Met SLP Short Term Goal 4 (Week 3): Patient will perform diaphragmatic breathing exercises with mod assist verbal and visual cues. SLP Short Term Goal 4 - Progress (Week 3): Met Week 4: SLP Short Term Goal 1 (Week 4): Patient will attend to left upper extremity and environment with supervision level verbal cues. SLP Short Term Goal 2 (Week 4): Patient will request help as needed with various tasks with supervision level question cues. SLP Short Term Goal 3 (Week 4): Patient will utilize memory compensatory strategies with min assist question cues SLP Short Term Goal 4 (Week 4): Patient will self monitor and correct errors with moderately complex tasks with min assist question cues  Weekly Progress Updates: Patient met 4 out of 4 short term goals this reporting period due to gains in carryover of safe swallow strategies, speech intelligibility and awareness.  Patient is tolerating regular textures and thin liquids with set up and intermittent assist as a result, her dysphagia goals have been met and will no longer be addressed.  Diagnostic treatment sessions have revealed that problem solving is greatly impacted by self-monitor and correction of errors with  visuospatial tasks as well as decreased recall of information.  So these goals are being added into the next reporting period.  Given these continued deficits this patient continues to require skilled SLP services to maximize functional independence to reduce burden of care prior to discharge.      SLP Intensity: Minumum of 1-2 x/day, 30 to 90 minutes SLP Frequency: 5 out of 7 days SLP Duration/Estimated Length of Stay: SNF SLP Treatment/Interventions: Cognitive remediation/compensation;Cueing hierarchy;Environmental controls;Functional tasks;Internal/external aids;Medication managment;Patient/family education;Therapeutic Activities  Charlane Ferretti., CCC-SLP [phone]  BOWIE,MELISSA 10/16/2012, 8:50 AM

## 2012-10-15 NOTE — Progress Notes (Signed)
Physical Therapy Weekly Progress Note  Patient Details  Name: Stephanie Mason MRN: [id number] Date of Birth: 01/13/1951  Today's Date: 10/15/2012 Time: 1030-1130 and 1300-1330 Time Calculation (min): 60 min and 30 min  Patient has met 0 of 4 short term goals.  Patient's progress over the last week significantly limited secondary to increased hamstring and adductor tone and subsequent pain in L hip, thigh, knee, calf, and ankle. Standing and L LE weight bearing activities as well as stretching/tone management have been significantly limited due to this pain. Patient c/o pain during weight bearing, certain therapeutic exercises, and stretching/PROM in various directions (IR/ER of L hip, L hip flexion, L knee extension, L hip abdution).  Patient continues to demonstrate the following deficits: decreased strength, activity tolerance, balance, postural control, coordination, attention, awareness, and therefore will continue to benefit from skilled PT intervention to enhance overall performance with activity tolerance, balance, postural control, ability to compensate for deficits, functional use of  left upper extremity and left lower extremity, attention, awareness and coordination.  Patient not progressing toward long term goals.  See goal revision..  Plan of care revisions: LTGs downgraded to min assist overall, mod assist for ambulation in controlled environment and stair negotiation.  PT Short Term Goals Week 3:  PT Short Term Goal 1 (Week 3): Patient will perform squat pivot transfers with min assist. PT Short Term Goal 1 - Progress (Week 3): Partly met PT Short Term Goal 2 (Week 3): Patient will perform bed mobility with min assist PT Short Term Goal 2 - Progress (Week 3): Progressing toward goal PT Short Term Goal 3 (Week 3): Patient will perform dynamic sitting activities reaching outside of BOS with supervision PT Short Term Goal 3 - Progress (Week 3): Progressing toward goal PT Short Term  Goal 4 (Week 3): Patient will ambulate 34' with LRAD and mod assist. PT Short Term Goal 4 - Progress (Week 3): Progressing toward goal Week 4:  PT Short Term Goal 1 (Week 4): Patient will perform squat pivot transfers with min assist. PT Short Term Goal 2 (Week 4): Patient will perform bed mobility with min assist PT Short Term Goal 3 (Week 4): Patient will ambulate 57' with LRAD and mod assist.  Skilled Therapeutic Interventions/Progress Updates:    AM Session: Patient received sitting in wheelchair. This session focused on wheelchair mobility, postural control in static standing, pre-gait activities, mat mobility, and stretching.  Patient instructed in wheelchair mobility 59' x2 with supervision and continues to require verbal cues for attention to L and for negotiation of obstacles on L side. Patient performed pre-gait activities with use of R handrail for UE support. Patient performed R LE forward and retro stepping x10 each direction to facilitate increased weight bearing through L LE. Patient requires mod-max assist for upright posture, midline orientation, and stabilization of L knee.   Patient performed squat pivot transfer wheelchair to mat with min assist and requires assistance for placement/positioning of L LE and weight bearing through L LE during transfer. Patient scoots along edge of mat with supervision. Patient requires max assist for sit>supine for B LE and trunk management. Passive and prolonged stretching of L hamstrings, adductors, and hip IR/ER. Patient cannot tolerate and c/o pain with total knee extension in supine on mat, with hamstring and adductor stretching with total knee extension and partial straight leg raise, hooklying hip adductor stretch, and with passive ROM hip IR. Pain ranges from L hip to L hamstrings and adductors. Appearing to be inconsistent pain patterns,  but noted significant increase in tone in L adductors and L hamstrings.   Patient returned to room and  L   elevating leg rest in place to promote L knee extension/stretch. Pillow positioned between knees to promote improved hip alignment/decreased IR. All needs within reach and seatbelt donned. Patient presents with increased lethargy during session.   PM Session: Patient received sitting in wheelchair. This session focused on wheelchair mobility and squat pivot transfers. Patient performed block practice x10 squat pivot transfers wheelchair<>mat with min assist progressing to supervision. Patient performed two of these transfers with supervision (when going to the L), but continues to requires consistent min assist when going to her R.  Patient returned to room and left seated in wheelchair with seatbelt donned and all needs within reach.   Therapy Documentation Precautions:  Precautions Precautions: Fall Precaution Comments: dense L hemiplegia, left neglect, L air cast as of 2/27 Restrictions Weight Bearing Restrictions: No Pain: Pain Assessment Pain Assessment: No/denies pain Pain Score: 0-No pain Pain Type: Acute pain Pain Location: Back Pain Orientation: Lower Pain Descriptors: Aching Pain Frequency: Intermittent Pain Intervention(s): Medication (See eMAR) Mobility: Bed Mobility Bed Mobility: Supine to Sit;Sit to Supine;Sitting - Scoot to Edge of Bed Supine to Sit: HOB flat;3: Mod assist Supine to Sit Details: Manual facilitation for placement;Manual facilitation for weight shifting;Verbal cues for precautions/safety;Verbal cues for sequencing;Verbal cues for technique Supine to Sit Details (indicate cue type and reason): Requires assist for lifting/lowering of B LE and trunk Sitting - Scoot to Edge of Bed: 5: Supervision Sitting - Scoot to Edge of Bed Details: Verbal cues for precautions/safety Sit to Supine: 3: Mod assist;HOB flat Sit to Supine - Details: Manual facilitation for placement;Manual facilitation for weight shifting;Verbal cues for precautions/safety;Verbal cues for  technique;Verbal cues for sequencing Sit to Supine - Details (indicate cue type and reason): Requires assist for lifting/lowering of B LE and trunk Transfers Squat Pivot Transfers: 4: Min assist;With armrests Squat Pivot Transfer Details: Manual facilitation for placement;Manual facilitation for weight bearing;Verbal cues for precautions/safety Squat Pivot Transfer Details (indicate cue type and reason): Patient benefits from [facility] facilitation for weight bearing through L LE. Locomotion : Corporate treasurer: Yes Wheelchair Assistance: 5: Financial planner Details: Verbal cues for Engineer, drilling: Right upper extremity;Right lower extremity Wheelchair Parts Management: Needs assistance Distance: 75 (Patient refused to continue wheelchair mobility)   See FIM for current functional status  Therapy/Group: Individual Therapy  Chipper Herb. Ripa, PT, DPT  10/15/2012, 12:22 PM

## 2012-10-15 NOTE — Progress Notes (Signed)
Occupational Therapy Session Note  Patient Details  Name: Stephanie Mason MRN: [id number] Date of Birth: 08/27/1950  Today's Date: 10/15/2012 Time: 0930-1026 Time Calculation (min): 56 min  Skilled Therapeutic Interventions/Progress Updates:    Pt seen for individual OT treatment session for ADL retraining at sink level/sponge bathe, dressing, transfer training and grooming. Focus session on attention to left, hemi dressing techniques, sit-stand, weight bearing & weight shifting through LUE during ADLs. Pt required cues for LLE foot placement and mod-max assist to block knee during dynamic standing balance  & weight shifting at sink for bathing and dressing tasks.  Therapy Documentation Precautions:  Precautions Precautions: Fall Precaution Comments: dense L hemiplegia, left neglect, L air cast as of 2/27 Restrictions Weight Bearing Restrictions: No     Pain: Pain Assessment Pain Assessment: No/denies pain Pain Score: 0-No pain ADL: ADL Eating: Minimal assistance Where Assessed-Eating: Bed level Grooming: Moderate assistance Where Assessed-Grooming: Edge of bed Upper Body Bathing: Moderate assistance Where Assessed-Upper Body Bathing: Edge of bed Lower Body Bathing: Maximal assistance Where Assessed-Lower Body Bathing: Edge of bed Upper Body Dressing: Maximal assistance Where Assessed-Upper Body Dressing: Edge of bed Lower Body Dressing: Maximal assistance Where Assessed-Lower Body Dressing: Edge of bed Toileting: Dependent Where Assessed-Toileting: Bedside Commode Toilet Transfer: Maximal assistance Toilet Transfer Method: Stand pivot Acupuncturist: Gaffer: Not assessed Film/video editor: Not assessed ADL Comments: Pt overall needing max assist for dynamic trunk control sitting EOB.  No attempt during ADL to use the LUE functionally.  Keeps her head turned to the right side, but will turn it left to scan her environment with  mod instructional cueing to locate items.     See FIM for current functional status  Therapy/Group: Individual Therapy  Alm Bustard 10/15/2012, 12:28 PM

## 2012-10-15 NOTE — Progress Notes (Signed)
Patient ID: Stephanie Mason, female   DOB: [birth year], 62 y.o.   MRN: [id number] Stephanie Mason is a 62 y.o. 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 Admitted 60/11/2012 to [facility] with left-sided weakness. Patient with noted seizure in the ED and received Ativan as well as loaded with Keppra. MRI of the brain showed large acute partially hemorrhagic right hemispheric infarct superimposed upon remote infarct. Patient did receive TPA. Echocardiogram with normal left ventricular function no source of embolus. EEG showed no seizure activity. Carotid Dopplers with no ICA stenosis. TCD with right terminal ICA stenosis. Patient was transferred to [facility] for ongoing evaluation. Placed on aspirin therapy for stroke prophylaxis as well as subcutaneous Lovenox for DVT prophylaxis. TEE completed 09/22/2012 showing no PFO or thrombus. Maintained on a dysphagia 3 thin liquid diet. Chest x-ray with questionable mass that was ruled out with CT of the chest  Subjective/Complaints: Objective: Runny nose Some improvement with Tightness in L arm  Review of Systems  Neurological: Positive for sensory change and focal weakness. Negative for seizures.  All other systems reviewed and are negative.     Vital Signs: Blood pressure 113/72, pulse 61, temperature 98.6 F (37 C), temperature source Oral, resp. rate 18, height 5\' 7"  (1.702 m), weight 70.58 kg (155 lb 9.6 oz), SpO2 95.00%. No results found. Results for orders placed during the hospital encounter of 09/22/12 (from the past 72 hour(s))  CREATININE, SERUM     Status: Abnormal   Collection Time    10/13/12  7:15 AM      Result Value Range   Creatinine, Ser 0.79  0.50 - 1.10 mg/dL   GFR calc non Af Amer 89 (*) >90 mL/min   GFR calc Af Amer >90  >90 mL/min   Comment:            The eGFR has been calculated     using the CKD EPI equation.     This calculation has not been   validated in all clinical     situations.     eGFR's persistently     <90 mL/min signify     possible Chronic Kidney Disease.     HEENT: Left lower facial droop Cardio: RRR and no extra sounds Resp: CTA B/L and unlabored GI: BS positive and non tender Extremity:  Pulses positive and No Edema Skin:   Intact and Other no pressure areas Neuro: Lethargic, Flat, Cranial Nerve Abnormalities L central VII, Abnormal Sensory Absent to LT, Proprio, Abnormal Motor 0/5 in LUE and 1/5 LLE, Abnormal FMC Ataxic/ dec FMC, Reflexes: 0, Tone:  Asworth 1+L elbow flexors,and wrist flexors Dysarthric and Inattention to left Musc/Skel:  No pain or swelling in LLE with palpation or ROM, neg Homan's Gen NAD   Assessment/Plan: 1. Functional deficits secondary to Right MCA infarct with hemorrhage which require 3+ hours per day of interdisciplinary therapy in a comprehensive inpatient rehab setting. Physiatrist is providing close team supervision and 24 hour management of active medical problems listed below. Physiatrist and rehab team continue to assess barriers to discharge/monitor patient progress toward functional and medical goals. Team conf 3/5 see note FIM: FIM - Bathing Bathing Steps Patient Completed: Chest;Left Arm;Abdomen;Right upper leg;Left upper leg;Right lower leg (including foot);Left lower leg (including foot);Front perineal area;Buttocks Bathing: 4: Min-Patient completes 8-9 84f 10 parts or 75+ percent (min assist for dynamic standing balance)  FIM - Upper Body Dressing/Undressing Upper body dressing/undressing steps patient completed:  Thread/unthread right sleeve of pullover shirt/dresss;Put head through opening of pull over shirt/dress;Pull shirt over trunk;Thread/unthread left sleeve of pullover shirt/dress Upper body dressing/undressing: 5: Supervision: Safety issues/verbal cues FIM - Lower Body Dressing/Undressing Lower body dressing/undressing steps patient completed: Thread/unthread  right pants leg;Don/Doff right shoe;Thread/unthread left pants leg;Pull pants up/down;Don/Doff right sock Lower body dressing/undressing: 3: Mod-Patient completed 50-74% of tasks (min assist for dynamic standing balance)  FIM - Toileting Toileting steps completed by patient: Adjust clothing prior to toileting;Performs perineal hygiene;Adjust clothing after toileting Toileting Assistive Devices: Grab bar or rail for support Toileting: 4: Steadying assist  FIM - Diplomatic Services operational officer Devices: Grab bars Toilet Transfers: 3-To toilet/BSC: Mod A (lift or lower assist);3-From toilet/BSC: Mod A (lift or lower assist)  FIM - Bed/Chair Transfer Bed/Chair Transfer Assistive Devices: Arm rests;HOB elevated;Bed rails Bed/Chair Transfer: 3: Sit > Supine: Mod A (lifting assist/Pt. 50-74%/lift 2 legs);3: Supine > Sit: Mod A (lifting assist/Pt. 50-74%/lift 2 legs;3: Bed > Chair or W/C: Mod A (lift or lower assist);3: Chair or W/C > Bed: Mod A (lift or lower assist)  FIM - Locomotion: Wheelchair Distance: 155 Locomotion: Wheelchair: 5: Travels 150 ft or more: maneuvers on rugs and over door sills with supervision, cueing or coaxing FIM - Locomotion: Ambulation Locomotion: Ambulation Assistive Devices: Chief Operating Officer Ambulation/Gait Assistance: 2: Max assist Locomotion: Ambulation: 0: Activity did not occur  Comprehension Comprehension Mode: Auditory Comprehension: 5-Understands complex 90% of the time/Cues < 10% of the time  Expression Expression Mode: Verbal Expression: 5-Expresses basic needs/ideas: With extra time/assistive device  Social Interaction Social Interaction Mode: Asleep Social Interaction: 5-Interacts appropriately 90% of the time - Needs monitoring or encouragement for participation or interaction.  Problem Solving Problem Solving Mode: Not assessed Problem Solving: 5-Solves basic 90% of the time/requires cueing < 10% of the time  Memory Memory Mode:  Asleep Memory: 3-Recognizes or recalls 50 - 74% of the time/requires cueing 25 - 49% of the time  Medical Problem List and Plan:  1. Partially hemorrhagic right MCA infarct superimposed upon remote right brain infarct with dense left hemiparesthesias and visual spatial deficits , developing intermittent painful spasmsTitrate  Zanaflex, titrate gabapentin for painful parasthesias. Added muscle rub also to above regimen 2. DVT Prophylaxis/Anticoagulation: Subcutaneous Lovenox.no signas of DVT ,Monitor platelet counts and any signs of bleeding  3. Pain Management: Ultram as needed. Left ankle sprain, Ice and air cast 4. Neuropsych: This patient is not capable of making decisions on his/her own behalf.  5. Seizure disorder. Keppra 500 mg every 12 hours. EEG negative  6. Hypertension. Avapro 150 mg daily. Monitor with increased activity 7.  Dysphagia, pocketing soft textures and staff supervision 8.  Macrocytosis with borderline low RBCs MMA nl, RBC folate nl, admits to 1 beer a day at home-monitor 9.  Poor attention, ritalin trial with some benefit, increase dose, add SSRI for longer term use 10.  Urinary incont mainly urgency, trial ditropan at noc LOS (Days) 23 A FACE TO FACE EVALUATION WAS PERFORMED  KIRSTEINS,ANDREW E 10/15/2012, 7:49 AM

## 2012-10-15 NOTE — Progress Notes (Signed)
Speech Language Pathology Daily Session Note  Patient Details  Name: Eira Alpert MRN: [id number] Date of Birth: Aug 04, 1951  Today's Date: 10/15/2012 Time: 0800-0845 Time Calculation (min): 45 min  Short Term Goals: Week 3: SLP Short Term Goal 1 (Week 3): Patient will consume regular textures and thin liquids with supervision verbal cues to utilize safe swallow compensatory strategies. SLP Short Term Goal 2 (Week 3): Patient will attend to left upper extremity and environment with supervision level verbal cues. SLP Short Term Goal 3 (Week 3): Patient will request help as needed with various tasks with min assist question cues. SLP Short Term Goal 4 (Week 3): Patient will perform diaphragmatic breathing exercises with mod assist verbal and visual cues.  Skilled Therapeutic Interventions: Treatment focus on dysphagia and cognitive goals. Pt was able to self- monitor and correct left buccal pocketing with Mod I. Pt also attend to left environment during self-feeding task and functional conversation with Mod I.  Pt participated in working memory task with focus on utilization of memory compensatory strategy of association.  Pt required Mod question and semantic cues for utilization of strategy to increase recall throughout the task.    FIM:  Comprehension Comprehension Mode: Auditory Comprehension: 5-Understands basic 90% of the time/requires cueing < 10% of the time Expression Expression Mode: Verbal Expression: 5-Expresses basic needs/ideas: With extra time/assistive device Problem Solving Problem Solving: 4-Solves basic 75 - 89% of the time/requires cueing 10 - 24% of the time Memory Memory: 3-Recognizes or recalls 50 - 74% of the time/requires cueing 25 - 49% of the time FIM - Eating Eating Activity: 5: Set-up assist for open containers  Pain Pain Assessment Pain Assessment: No/denies pain  Therapy/Group: Individual Therapy  PAYNE, COURTNEY 10/15/2012, 11:30 AM

## 2012-10-16 ENCOUNTER — Inpatient Hospital Stay (HOSPITAL_COMMUNITY): Payer: BC Managed Care – PPO | Admitting: *Deleted

## 2012-10-16 ENCOUNTER — Inpatient Hospital Stay (HOSPITAL_COMMUNITY): Payer: BC Managed Care – PPO | Admitting: Speech Pathology

## 2012-10-16 MED ORDER — FLUTICASONE PROPIONATE 50 MCG/ACT NA SUSP
1.0000 | Freq: Every day | NASAL | Status: DC
  Administered 2012-10-16 – 2012-10-19 (×4): 1 via NASAL
  Filled 2012-10-16: qty 16

## 2012-10-16 NOTE — Progress Notes (Signed)
Patient ID: Stephanie Mason, female   DOB: 03-28-1951, 62 y.o.   MRN: [id number] Stephanie Mason is a 62 y.o. 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 Admitted 09/15/2012 to [facility] with left-sided weakness. Patient with noted seizure in the ED and received Ativan as well as loaded with Keppra. MRI of the brain showed large acute partially hemorrhagic right hemispheric infarct superimposed upon remote infarct. Patient did receive TPA. Echocardiogram with normal left ventricular function no source of embolus. EEG showed no seizure activity. Carotid Dopplers with no ICA stenosis. TCD with right terminal ICA stenosis. Patient was transferred to [facility] for ongoing evaluation. Placed on aspirin therapy for stroke prophylaxis as well as subcutaneous Lovenox for DVT prophylaxis. TEE completed 09/22/2012 showing no PFO or thrombus. Maintained on a dysphagia 3 thin liquid diet. Chest x-ray with questionable mass that was ruled out with CT of the chest  Subjective/Complaints: Objective: Runny nose Some improvement with Tightness in L arm  Review of Systems  Neurological: Positive for sensory change and focal weakness. Negative for seizures.  All other systems reviewed and are negative.     Vital Signs: Blood pressure 114/72, pulse 64, temperature 97.8 F (36.6 C), temperature source Oral, resp. rate 18, height 5\' 7"  (1.702 m), weight 70.58 kg (155 lb 9.6 oz), SpO2 95.00%. No results found. No results found for this or any previous visit (from the past 72 hour(s)).   HEENT: Left lower facial droop Cardio: RRR and no extra sounds Resp: CTA B/L and unlabored GI: BS positive and non tender Extremity:  Pulses positive and No Edema Skin:   Intact and Other no pressure areas Neuro: Lethargic, Flat, Cranial Nerve Abnormalities L central VII, Abnormal Sensory Absent to LT, Proprio, Abnormal Motor 0/5 in LUE and 1/5 LLE, Abnormal  FMC Ataxic/ dec FMC, Reflexes: 0, Tone:  Asworth 1+L elbow flexors,and wrist flexors Dysarthric and Inattention to left Musc/Skel:  No pain or swelling in LLE with palpation or ROM, neg Homan's Gen NAD   Assessment/Plan: 1. Functional deficits secondary to Right MCA infarct with hemorrhage which require 3+ hours per day of interdisciplinary therapy in a comprehensive inpatient rehab setting. Physiatrist is providing close team supervision and 24 hour management of active medical problems listed below. Physiatrist and rehab team continue to assess barriers to discharge/monitor patient progress toward functional and medical goals. Team conf 3/5 see note FIM: FIM - Bathing Bathing Steps Patient Completed: Chest;Left Arm;Abdomen;Front perineal area;Right upper leg;Left upper leg;Right lower leg (including foot);Left lower leg (including foot) Bathing: 4: Min-Patient completes 8-9 54f 10 parts or 75+ percent  FIM - Upper Body Dressing/Undressing Upper body dressing/undressing steps patient completed: Thread/unthread right sleeve of pullover shirt/dresss;Thread/unthread left sleeve of pullover shirt/dress;Put head through opening of pull over shirt/dress;Pull shirt over trunk Upper body dressing/undressing: 5: Supervision: Safety issues/verbal cues FIM - Lower Body Dressing/Undressing Lower body dressing/undressing steps patient completed: Thread/unthread right pants leg;Don/Doff right shoe;Pull pants up/down Lower body dressing/undressing: 3: Mod-Patient completed 50-74% of tasks  FIM - Toileting Toileting steps completed by patient: Adjust clothing prior to toileting;Performs perineal hygiene;Adjust clothing after toileting Toileting Assistive Devices: Grab bar or rail for support Toileting: 0: Activity did not occur  FIM - Diplomatic Services operational officer Devices: Grab bars Toilet Transfers: 3-To toilet/BSC: Mod A (lift or lower assist);3-From toilet/BSC: Mod A (lift or lower  assist)  FIM - Bed/Chair Transfer Bed/Chair Transfer Assistive Devices: Arm rests Bed/Chair Transfer: 2: Sit >  Supine: Max A (lifting assist/Pt. 25-49%);2: Supine > Sit: Max A (lifting assist/Pt. 25-49%);2: Bed > Chair or W/C: Max A (lift and lower assist);2: Chair or W/C > Bed: Max A (lift and lower assist)  FIM - Locomotion: Wheelchair Distance: 155 Locomotion: Wheelchair: 2: Travels 50 - 149 ft with supervision, cueing or coaxing FIM - Locomotion: Ambulation Locomotion: Ambulation Assistive Devices: Chief Operating Officer Ambulation/Gait Assistance: 2: Max assist Locomotion: Ambulation: 0: Activity did not occur  Comprehension Comprehension Mode: Auditory Comprehension: 5-Understands basic 90% of the time/requires cueing < 10% of the time  Expression Expression Mode: Verbal Expression: 5-Expresses basic needs/ideas: With extra time/assistive device  Social Interaction Social Interaction Mode: Asleep Social Interaction: 5-Interacts appropriately 90% of the time - Needs monitoring or encouragement for participation or interaction.  Problem Solving Problem Solving Mode: Not assessed Problem Solving: 4-Solves basic 75 - 89% of the time/requires cueing 10 - 24% of the time  Memory Memory Mode: Asleep Memory: 3-Recognizes or recalls 50 - 74% of the time/requires cueing 25 - 49% of the time  Medical Problem List and Plan:  1. Partially hemorrhagic right MCA infarct superimposed upon remote right brain infarct with dense left hemiparesthesias and visual spatial deficits , developing intermittent painful spasmsTitrate  Zanaflex, titrate gabapentin for painful parasthesias. Feeling better since meds adjusted  Added muscle rub also to above regimen 2. DVT Prophylaxis/Anticoagulation: Subcutaneous Lovenox.no signas of DVT ,Monitor platelet counts and any signs of bleeding  3. Pain Management: Ultram as needed. Left ankle sprain, Ice and air cast 4. Neuropsych: This patient is not capable of  making decisions on his/her own behalf.  5. Seizure disorder. Keppra 500 mg every 12 hours. EEG negative  6. Hypertension. Avapro 150 mg daily. Monitor with increased activity 7.  Dysphagia, pocketing soft textures and staff supervision 8.  Macrocytosis with borderline low RBCs MMA nl, RBC folate nl, admits to 1 beer a day at home-monitor 9.  Poor attention, ritalin trial with some benefit, increase dose, add SSRI for longer term use 10.  Urinary incont mainly urgency, trial ditropan at noc LOS (Days) 24 A FACE TO FACE EVALUATION WAS PERFORMED  KIRSTEINS,ANDREW E 10/16/2012, 11:48 AM

## 2012-10-16 NOTE — Progress Notes (Signed)
Speech Language Pathology Daily Session Note  Patient Details  Name: Stephanie Mason MRN: [id number] Date of Birth: 1951-06-30  Today's Date: 10/16/2012 Time: 1300-1345 Time Calculation (min): 45 min  Short Term Goals: Week 4: SLP Short Term Goal 1 (Week 4): Patient will attend to left upper extremity and environment with supervision level verbal cues. SLP Short Term Goal 2 (Week 4): Patient will request help as needed with various tasks with supervision level question cues. SLP Short Term Goal 3 (Week 4): Patient will utilize memory compensatory strategies with min assist question cues SLP Short Term Goal 4 (Week 4): Patient will self monitor and correct errors with moderately complex tasks with min assist question cues  Skilled Therapeutic Interventions: Skilled treatment session focused on addressing cognitive goals. SLP facilitated session by providing mod assist semantic and verbal cues to self-monitor and correct errors with visuospatial tasks by alternating attention between different objects in a moderately visually distracting environment, especially in her left visual field. Patient was able to locate 2 similar objects but unable to identify 3 without clinician cues.    FIM:  Comprehension Comprehension Mode: Auditory Comprehension: 5-Understands basic 90% of the time/requires cueing < 10% of the time Expression Expression Mode: Verbal Expression: 5-Expresses basic needs/ideas: With extra time/assistive device Social Interaction Social Interaction: 5-Interacts appropriately 90% of the time - Needs monitoring or encouragement for participation or interaction. Problem Solving Problem Solving: 4-Solves basic 75 - 89% of the time/requires cueing 10 - 24% of the time Memory Memory: 3-Recognizes or recalls 50 - 74% of the time/requires cueing 25 - 49% of the time FIM - Eating Eating Activity: 5: Set-up assist for open containers  Pain Pain Assessment Pain Assessment: No/denies  pain  Therapy/Group: Individual Therapy  PAYNE, COURTNEY 10/16/2012, 3:27 PM

## 2012-10-16 NOTE — Progress Notes (Signed)
Occupational Therapy Note  Patient Details  Name: Stephanie Mason MRN: [id number] Date of Birth: July 23, 1951 Today's Date: 10/16/2012 Time:  0900-1000   (60 min) Pain :  Left thigh pain  8/10  See Mar)  Individual session  1st session:      Engaged in bathing and dressng at sink level.  Pt. Directed care throughout the session.  Needed moderate facilitation to LUE for weight bearing through entire arm when standing to wash.  Needed mod to minimal assist for standing balance at sink with one episode of leaning to the left, but OT able to help pt regain balance.  Pt. Lives in [place] and plans to go to [facility] after Discharge on Monday.  [name] is her boyfriend and will assist her prn after SNF.  Left pt in wc with safety belt on and supplies in arms reach.    2nd session: Time:  1430-1500  (30 min)  Pain:  Left deltoid, teres minor , bicep tendon,triceps.   Engaged in LUE neuromuscular reeducation with myotherapy, soft tissue massage, PROM, Self ROM.  Pt has anterior subluxation and the pressure points.    Provided education about posture in midline and erect sitting, stretching exercises to LUE , but never pulling on arm.  Practiced self ROM with elbow supported by other arm and stretching out with rolling table.  Pt. Asked a lot of questions and will continue to address the exercises and pain before discharge on Monday.    Humberto Seals 10/16/2012, 10:05 AM

## 2012-10-16 NOTE — Discharge Summary (Signed)
  discharge summary job # [id number]

## 2012-10-16 NOTE — Progress Notes (Signed)
Social Work Patient ID: Stephanie Mason, female   DOB: November 14, 1950, 62 y.o.   MRN: [id number] Spoke with [facility] who reports to get insurance authorization on Monday for pt's admission.  Plan for transfer to facility on Monday afternoon. Informed pt she is agreeable and contacted daughter to inform.  Both agreeable to plan.  Work toward discharge Monday, team aware as well as MD/PA.

## 2012-10-16 NOTE — Progress Notes (Signed)
Physical Therapy Session Note  Patient Details  Name: Stephanie Mason MRN: [id number] Date of Birth: Oct 20, 1950  Today's Date: 10/16/2012 Time: 1030-1130 Time Calculation (min): 60 min  Short Term Goals: Week 4:  PT Short Term Goal 1 (Week 4): Patient will perform squat pivot transfers with min assist. PT Short Term Goal 2 (Week 4): Patient will perform bed mobility with min assist PT Short Term Goal 3 (Week 4): Patient will ambulate 58' with LRAD and mod assist.  Skilled Therapeutic Interventions/Progress Updates:    Patient received sitting in wheelchair. Today's session focused on wheelchair mobility, static and dynamic standing balance, and postural control in standing.  See details below for standing balance activities. Patient continues to demonstrate R shoulder elevation, posterior trunk rotation on the L, L lateral trunk lean, and overall poor midline orientation. Patient completed wheelchair obstacle course x2 (weaving in/out of 4 cones and negotiating small ledge) with emphasis on attention to obstacles on the L. Patient requires constant verbal and visual cues to properly negotiate obstacles on her L side.  Patient returned to room and left seated in wheelchair with seatbelt donned and all needs within reach. Pillow placed between knees to facilitate decreased L hip internal rotation.  Therapy Documentation Precautions:  Precautions Precautions: Fall Precaution Comments: dense L hemiplegia, left neglect, L air cast as of 2/27 Restrictions Weight Bearing Restrictions: No Pain: Pain Assessment Pain Assessment: No/denies pain Pain Score: 0-No pain Locomotion : Wheelchair Mobility Wheelchair Mobility: Yes Wheelchair Assistance: 5: Financial planner Details: Verbal cues for Engineer, drilling: Right upper extremity;Right lower extremity Wheelchair Parts Management: Needs assistance Distance: 155  Balance: Balance Balance Assessed:  Yes Dynamic Standing Balance Dynamic Standing - Balance Support: Bilateral upper extremity supported (in standing frame) Dynamic Standing - Level of Assistance: 5: Stand by assistance Dynamic Standing - Balance Activities: Forward lean/weight shifting;Lateral lean/weight shifting Dynamic Standing - Comments: Patient performed 2 cognitive tasks standing in standing frame (without harness) with B UE support and L knee stabiilization provided by standing frame. With set up and support provided by standing frame, patient requires stand by assist. Patient standing in front of mirror for visual feed back for posture. Cognitive tasks placed on mirror to patient's L to increase attention/scanning to the L. Patient required to write on mirror with markers, facilitating forward reaching/anterior weight shifts and crossing midline with R UE to write on her L side. The whole activity requires 1 round x15' of standing and 1 round of 5' standing.  See FIM for current functional status  Therapy/Group: Individual Therapy  Chipper Herb. Ripa, PT, DPT  10/16/2012, 12:23 PM

## 2012-10-17 ENCOUNTER — Inpatient Hospital Stay (HOSPITAL_COMMUNITY): Payer: BC Managed Care – PPO | Admitting: Occupational Therapy

## 2012-10-17 NOTE — Discharge Summary (Signed)
[name], Mason             ACCOUNT NO.:  [id number]  MEDICAL RECORD NO.:  [id number]  LOCATION:  4034                         FACILITY:  [facility]  PHYSICIAN:  Stephanie Mason, M.D.DATE OF BIRTH:  10-Jul-1951  DATE OF ADMISSION:  09/22/2012 DATE OF DISCHARGE:  10/19/2012                              DISCHARGE SUMMARY   DISCHARGE DIAGNOSES: 1. 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 2. Subcutaneous Lovenox for deep vein thrombosis prophylaxis. 3. Pain management. 4. Seizure disorder. 5. Hypertension. 6. Dysphagia. 7. Poor attention and urinary incontinence.  HISTORY OF PRESENT ILLNESS:  This is a 62 year old right-handed female with history of hypertension, CVA 22 years ago with little residual, was admitted on September 15, 2012 to [facility] with left- sided weakness.  The patient with noted seizure in the emergency department, received Ativan as well as loaded with Keppra.  MRI of the brain showed large acute partially hemorrhagic right hemispheric infarcts, superimposed upon remote infarct.  The patient did receive t- PA.  Echocardiogram with normal left ventricular function.  No source of embolus.  EEG showed no seizure activity.  Carotid Dopplers with no ICA stenosis.  Transcranial Dopplers with right terminal ICA stenosis.  The patient was transferred to [facility] for ongoing evaluation. Maintained on aspirin therapy as well as subcutaneous Lovenox for DVT prophylaxis.  TEE completed showing no PFO or thrombus.  She was maintained on a mechanical soft diet.  The patient was admitted for comprehensive rehab program.  PAST MEDICAL HISTORY:  See discharge diagnoses.  SOCIAL HISTORY:  Lives with spouse.  FUNCTIONAL HISTORY PRIOR TO ADMISSION:  She worked at a [employer] facility.  FUNCTIONAL STATUS UPON ADMISSION TO REHAB SERVICES:  +2 total assist sit to  stand.  PHYSICAL EXAMINATION:  VITAL SIGNS:  Blood pressure 145/67, pulse 57, temperature 97.9, respirations 18. GENERAL:  This was an alert female, well developed, flat affect, with right gaze preference.  She follows simple commands.  Names, person, place, date of birth.  She had a mild left facial droop.  Speech was mildly slurred. LUNGS:  Clear to auscultation. CARDIAC:  Regular rate and rhythm. ABDOMEN:  Soft, nontender.  Good bowel sounds.  REHABILITATION HOSPITAL COURSE:  The patient was admitted to Inpatient Rehab Services with therapies initiated on a 3-hour daily basis consisting of physical therapy, occupational therapy, speech therapy, and rehabilitation nursing.  The following issues were addressed during the patient's rehabilitation stay.  Pertaining to Stephanie Mason's partially hemorrhagic right MCA infarct, superimposed upon remote right brain infarct remained stable, maintained on aspirin therapy.  Subcutaneous Lovenox for DVT prophylaxis.  Pain management with the use of Zanaflex 4 mg 4 times daily as well as Ultram as needed for recent left ankle sprain.  She remained on Keppra for seizure prophylaxis.  EEG was negative.  Blood pressures well controlled.  She did have some poor attention and limited awareness to tasks, placed on Ritalin and monitored.  Bouts of urinary incontinence.  Urinalysis study negative. She was placed on Ditropan at nighttime with good results.  The patient received weekly collaborative interdisciplinary team conferences to discuss estimated length of  stay, family teaching, and any barriers to discharge.  She was continent of bowel and bladder, minimal assist bathing and upper body dressing, moderate-to-max assist lower body dressing, moderate with toileting and toilet transfers, moderate assist bed mobility from a flat surface, minimum to mod assist for sit-to- stand, minimum-to-to moderate assist for squat pivot transfers, max assist to  ambulate 25 feet with right hand rail.  Due to the patient's limited gains and supportive home, it was felt skilled nursing facility was needed, nut bed becoming available on October 19, 2012, discharge taking place at that time.  DISCHARGE MEDICATIONS: 1. Aspirin 325 mg p.o. daily. 2. Flonase 1 spray each nostril daily. 3. Neurontin 300 mg p.o. t.i.d. 4. Avapro 150 mg p.o. daily. 5. Keppra 500 mg p.o. b.i.d. 6. Ritalin 10 mg p.o. b.i.d. 7. Ditropan 5 mg p.o. at bedtime. 8. Protonix 40 mg p.o. daily. 9. Zanaflex 4 mg p.o. q.i.d. 10.Ultram 100 mg p.o. every 6 hours as needed for pain.  DIET:  Regular consistency.  SPECIAL INSTRUCTIONS:  The patient should follow up with Dr. Claudette Mason, [phone].  Please call for appointment as their office is currently closed due to inclement weather.  Follow up with Dr. Delia Mason, Neurology Services in 1 month.     Stephanie Mason, P.A.   ______________________________ Stephanie Mason, M.D.    DA/MEDQ  D:  10/16/2012  T:  10/17/2012  Job:  [id number]  cc:   Stephanie P. Pearlean Brownie, MD Stephanie Rowan, MD

## 2012-10-17 NOTE — Progress Notes (Signed)
Occupational Therapy Session Note  Patient Details  Name: Stephanie Mason MRN: [id number] Date of Birth: Jan 11, 1951  Today's Date: 10/17/2012 Time: 1415-1500 Time Calculation (min): 45 min  Short Term Goals: Week 3:  OT Short Term Goal 1 (Week 3): STG=LTGs of overall Min assist - Supervision  Skilled Therapeutic Interventions/Progress Updates:    Pt seen for 1:1 OT with focus on LUE NM re-ed with PROM and self-ROM in supine and sitting.  Engaged in shoulder flexion/extension, abduction/adduction, and internal/external rotation in supine with pt reporting slight pain in shoulder with flexion >90 degrees.  Educated pt on LUE positioning to decrease subluxation both in sitting and when in bed, pt reports understanding.  Re-educated on self-ROM exercises with education on proper hand placement to increase control of LUE and proper positioning to complete exercises without additional pain.  Completed shoulder elevation and scapular retraction in unsupported sitting with manual facilitation at Lt scapula.  Therapy Documentation Precautions:  Precautions Precautions: Fall Precaution Comments: dense L hemiplegia, left neglect, L air cast as of 2/27 Restrictions Weight Bearing Restrictions: No General:   Vital Signs: Therapy Vitals Pulse Rate: 77 Resp: 18 BP: 85/60 mmHg Patient Position, if appropriate: Sitting Oxygen Therapy SpO2: 93 % O2 Device: None (Room air) Pain:  Pt reports slight pain in Rt shoulder with ROM, premedicated.  See FIM for current functional status  Therapy/Group: Individual Therapy  Leonette Monarch 10/17/2012, 3:21 PM

## 2012-10-17 NOTE — Progress Notes (Signed)
Patient ID: Stephanie Mason, female   DOB: Apr 23, 1951, 62 y.o.   MRN: [id number] Stephanie Mason is a 62 y.o. 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 Admitted 09/15/2012 to [facility] with left-sided weakness. Patient with noted seizure in the ED and received Ativan as well as loaded with Keppra. MRI of the brain showed large acute partially hemorrhagic right hemispheric infarct superimposed upon remote infarct. Patient did receive TPA. Echocardiogram with normal left ventricular function no source of embolus. EEG showed no seizure activity. Carotid Dopplers with no ICA stenosis. TCD with right terminal ICA stenosis. Patient was transferred to [facility] for ongoing evaluation. Placed on aspirin therapy for stroke prophylaxis as well as subcutaneous Lovenox for DVT prophylaxis. TEE completed 09/22/2012 showing no PFO or thrombus. Maintained on a dysphagia 3 thin liquid diet. Chest x-ray with questionable mass that was ruled out with CT of the chest  Subjective/Complaints: Objective: Runny nose improved on Flonase Some improvement with Tightness in L arm and leg Review of Systems  Neurological: Positive for sensory change and focal weakness. Negative for seizures.  All other systems reviewed and are negative.     Vital Signs: Blood pressure 107/69, pulse 65, temperature 97.9 F (36.6 C), temperature source Oral, resp. rate 18, height 5\' 7"  (1.702 m), weight 70.58 kg (155 lb 9.6 oz), SpO2 98.00%. No results found. No results found for this or any previous visit (from the past 72 hour(s)).   HEENT: Left lower facial droop Cardio: RRR and no extra sounds Resp: CTA B/L and unlabored GI: BS positive and non tender Extremity:  Pulses positive and No Edema Skin:   Intact and Other no pressure areas Neuro: Lethargic, Flat, Cranial Nerve Abnormalities L central VII, Abnormal Sensory Absent to LT, Proprio, Abnormal Motor 0/5  in LUE and 2-/5 LLE hip flexor only, Abnormal FMC Ataxic/ dec FMC, Reflexes: 0, Tone:  Asworth 1+L elbow flexors,and wrist flexors Dysarthric and Inattention to left Musc/Skel:  No pain or swelling in LLE with palpation or ROM, neg Homan's Gen NAD   Assessment/Plan: 1. Functional deficits secondary to Right MCA infarct with hemorrhage which require 3+ hours per day of interdisciplinary therapy in a comprehensive inpatient rehab setting. Physiatrist is providing close team supervision and 24 hour management of active medical problems listed below. Physiatrist and rehab team continue to assess barriers to discharge/monitor patient progress toward functional and medical goals. Team conf 3/5 see note FIM: FIM - Bathing Bathing Steps Patient Completed: Chest;Left Arm;Abdomen;Front perineal area;Right upper leg;Left upper leg;Right lower leg (including foot);Left lower leg (including foot) Bathing: 4: Min-Patient completes 8-9 57f 10 parts or 75+ percent  FIM - Upper Body Dressing/Undressing Upper body dressing/undressing steps patient completed: Thread/unthread right sleeve of pullover shirt/dresss;Thread/unthread left sleeve of pullover shirt/dress;Put head through opening of pull over shirt/dress;Pull shirt over trunk Upper body dressing/undressing: 4: Min-Patient completed 75 plus % of tasks FIM - Lower Body Dressing/Undressing Lower body dressing/undressing steps patient completed: Thread/unthread right pants leg;Don/Doff right shoe;Pull pants up/down Lower body dressing/undressing: 3: Mod-Patient completed 50-74% of tasks  FIM - Toileting Toileting steps completed by patient: Adjust clothing prior to toileting;Performs perineal hygiene;Adjust clothing after toileting Toileting Assistive Devices: Grab bar or rail for support Toileting: 0: Activity did not occur  FIM - Diplomatic Services operational officer Devices: Grab bars Toilet Transfers: 3-To toilet/BSC: Mod A (lift or lower  assist);3-From toilet/BSC: Mod A (lift or lower assist)  FIM - Bed/Chair Transfer Bed/Chair  Transfer Assistive Devices: Arm rests Bed/Chair Transfer: 3: Bed > Chair or W/C: Mod A (lift or lower assist);3: Chair or W/C > Bed: Mod A (lift or lower assist)  FIM - Locomotion: Wheelchair Distance: 155 Locomotion: Wheelchair: 2: Travels 50 - 149 ft with supervision, cueing or coaxing FIM - Locomotion: Ambulation Locomotion: Ambulation Assistive Devices: Chief Operating Officer Ambulation/Gait Assistance: 2: Max assist Locomotion: Ambulation: 0: Activity did not occur  Comprehension Comprehension Mode: Auditory Comprehension: 5-Understands basic 90% of the time/requires cueing < 10% of the time  Expression Expression Mode: Verbal Expression: 5-Expresses basic needs/ideas: With extra time/assistive device  Social Interaction Social Interaction Mode: Asleep Social Interaction: 5-Interacts appropriately 90% of the time - Needs monitoring or encouragement for participation or interaction.  Problem Solving Problem Solving Mode: Not assessed Problem Solving: 4-Solves basic 75 - 89% of the time/requires cueing 10 - 24% of the time  Memory Memory Mode: Asleep Memory: 3-Recognizes or recalls 50 - 74% of the time/requires cueing 25 - 49% of the time  Medical Problem List and Plan:  1. Partially hemorrhagic right MCA infarct superimposed upon remote right brain infarct with dense left hemiparesthesias and visual spatial deficits , developing intermittent painful spasmsTitrate  Zanaflex, titrate gabapentin for painful parasthesias. Feeling better since meds adjusted Discussed usual pattern of recovery 2. DVT Prophylaxis/Anticoagulation: Subcutaneous Lovenox.no signas of DVT ,Monitor platelet counts and any signs of bleeding  3. Pain Management: Ultram as needed. Left ankle sprain, Ice and air cast 4. Neuropsych: This patient is not capable of making decisions on his/her own behalf.  5. Seizure disorder.  Keppra 500 mg every 12 hours. EEG negative  6. Hypertension. Avapro 150 mg daily. Monitor with increased activity 7.  Dysphagia, pocketing soft textures and staff supervision 8.  Macrocytosis with borderline low RBCs MMA nl, RBC folate nl, admits to 1 beer a day at home-monitor 9.  Poor attention, ritalin trial with some benefit, increase dose, add SSRI for longer term use 10.  Urinary incont mainly urgency, trial ditropan at noc LOS (Days) 25 A FACE TO FACE EVALUATION WAS PERFORMED  Ketty Bitton E 10/17/2012, 9:53 AM

## 2012-10-18 ENCOUNTER — Inpatient Hospital Stay (HOSPITAL_COMMUNITY): Payer: BC Managed Care – PPO | Admitting: Physical Therapy

## 2012-10-18 NOTE — Progress Notes (Addendum)
Physical Therapy Note  Patient Details  Name: Jenay Morici MRN: [id number] Date of Birth: 1951/04/01 Today's Date: 10/18/2012  1445-1530 (45 minutes) individual Pain: no reported pain Focus of treatment: Therapeutic exercise focused on Lt LE strengthening/control; gait training Treatment: Stand/turn transfer mod assist with vcs to attempt step on left; Nustep Level 4 LEs only (intermittent assist LT LE) X 10 minutes; gait with rail right mod assist ( ace wrap LT foot) X 4 . Pt advances LT LE without assist with improving knee extension control in stance ( with tactile cues left shoulder to facilitate LT hip extension in stance). No complaint of pain LT LE during gait.    HOUT,JIM 10/18/2012, 3:28 PM

## 2012-10-18 NOTE — Progress Notes (Signed)
Patient ID: Stephanie Mason, female   DOB: 1951/04/26, 62 y.o.   MRN: [id number] Stephanie Mason is a 62 y.o. 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 Admitted 09/15/2012 to [facility] with left-sided weakness. Patient with noted seizure in the ED and received Ativan as well as loaded with Keppra. MRI of the brain showed large acute partially hemorrhagic right hemispheric infarct superimposed upon remote infarct. Patient did receive TPA. Echocardiogram with normal left ventricular function no source of embolus. EEG showed no seizure activity. Carotid Dopplers with no ICA stenosis. TCD with right terminal ICA stenosis. Patient was transferred to [facility] for ongoing evaluation. Placed on aspirin therapy for stroke prophylaxis as well as subcutaneous Lovenox for DVT prophylaxis. TEE completed 09/22/2012 showing no PFO or thrombus. Maintained on a dysphagia 3 thin liquid diet. Chest x-ray with questionable mass that was ruled out with CT of the chest  Subjective/Complaints: Objective: LLE sore last noc but not this am What did my blood work show?  Review of Systems  Neurological: Positive for sensory change and focal weakness. Negative for seizures.  All other systems reviewed and are negative.     Vital Signs: Blood pressure 106/67, pulse 59, temperature 98.6 F (37 C), temperature source Oral, resp. rate 18, height 5\' 7"  (1.702 m), weight 70.58 kg (155 lb 9.6 oz), SpO2 99.00%. No results found. No results found for this or any previous visit (from the past 72 hour(s)).   HEENT: Left lower facial droop Cardio: RRR and no extra sounds Resp: CTA B/L and unlabored GI: BS positive and non tender Extremity:  Pulses positive and No Edema Skin:   Intact and Other no pressure areas Neuro: Lethargic, Flat, Cranial Nerve Abnormalities L central VII, Abnormal Sensory Absent to LT, Proprio, Abnormal Motor 0/5 in LUE and 2-/5 LLE  hip flexor only, Abnormal FMC Ataxic/ dec FMC, Reflexes: 0, Tone:  Asworth 1+L elbow flexors,and wrist flexors , Ashworth 1 leg flexors  Mildly Dysarthric and Inattention to left Musc/Skel:  No pain or swelling in LLE with palpation or ROM, neg Homan's Gen NAD   Assessment/Plan: 1. Functional deficits secondary to Right MCA infarct with hemorrhage which require 3+ hours per day of interdisciplinary therapy in a comprehensive inpatient rehab setting. Physiatrist is providing close team supervision and 24 hour management of active medical problems listed below. Physiatrist and rehab team continue to assess barriers to discharge/monitor patient progress toward functional and medical goals. Should be ready for D/C to SNF in am FIM: FIM - Bathing Bathing Steps Patient Completed: Chest;Left Arm;Abdomen;Front perineal area;Right upper leg;Left upper leg;Right lower leg (including foot);Left lower leg (including foot) Bathing: 4: Min-Patient completes 8-9 81f 10 parts or 75+ percent  FIM - Upper Body Dressing/Undressing Upper body dressing/undressing steps patient completed: Thread/unthread right sleeve of pullover shirt/dresss;Thread/unthread left sleeve of pullover shirt/dress;Put head through opening of pull over shirt/dress;Pull shirt over trunk Upper body dressing/undressing: 4: Min-Patient completed 75 plus % of tasks FIM - Lower Body Dressing/Undressing Lower body dressing/undressing steps patient completed: Thread/unthread right pants leg;Don/Doff right shoe;Pull pants up/down Lower body dressing/undressing: 3: Mod-Patient completed 50-74% of tasks  FIM - Toileting Toileting steps completed by patient: Adjust clothing prior to toileting;Performs perineal hygiene;Adjust clothing after toileting Toileting Assistive Devices: Grab bar or rail for support Toileting: 4: Steadying assist  FIM - Diplomatic Services operational officer Devices: Grab bars Toilet Transfers: 3-To toilet/BSC: Mod  A (lift or lower assist);3-From toilet/BSC: Mod A (  lift or lower assist)  FIM - Bed/Chair Transfer Bed/Chair Transfer Assistive Devices: Arm rests Bed/Chair Transfer: 3: Bed > Chair or W/C: Mod A (lift or lower assist);3: Chair or W/C > Bed: Mod A (lift or lower assist)  FIM - Locomotion: Wheelchair Distance: 155 Locomotion: Wheelchair: 2: Travels 50 - 149 ft with supervision, cueing or coaxing FIM - Locomotion: Ambulation Locomotion: Ambulation Assistive Devices: Chief Operating Officer Ambulation/Gait Assistance: 2: Max assist Locomotion: Ambulation: 0: Activity did not occur  Comprehension Comprehension Mode: Auditory Comprehension: 5-Follows basic conversation/direction: With no assist  Expression Expression Mode: Verbal Expression: 5-Expresses basic needs/ideas: With no assist  Social Interaction Social Interaction Mode: Asleep Social Interaction: 5-Interacts appropriately 90% of the time - Needs monitoring or encouragement for participation or interaction.  Problem Solving Problem Solving Mode: Not assessed Problem Solving: 4-Solves basic 75 - 89% of the time/requires cueing 10 - 24% of the time  Memory Memory Mode: Asleep Memory: 3-Recognizes or recalls 50 - 74% of the time/requires cueing 25 - 49% of the time  Medical Problem List and Plan:  1. Partially hemorrhagic right MCA infarct superimposed upon remote right brain infarct with dense left hemiparesthesias and visual spatial deficits , developing intermittent painful spasmsTitrate  Zanaflex, titrate gabapentin for painful parasthesias. Feeling better since meds adjusted Discussed usual pattern of recovery 2. DVT Prophylaxis/Anticoagulation: Subcutaneous Lovenox.no signas of DVT ,Monitor platelet counts and any signs of bleeding  3. Pain Management: Ultram as needed. Left ankle sprain, Ice and air cast 4. Neuropsych: This patient is not capable of making decisions on his/her own behalf.  5. Seizure disorder. Keppra 500 mg every  12 hours. EEG negative  6. Hypertension. Avapro 150 mg daily. Monitor with increased activity 7.  Dysphagia, pocketing soft textures and staff supervision 8.  Macrocytosis with borderline low RBCs MMA nl, RBC folate nl, admits to 1 beer a day at home-monitor 9.  Poor attention, ritalin trial with some benefit, increase dose, add SSRI for longer term use 10.  Urinary incont mainly urgency, trial ditropan at noc LOS (Days) 26 A FACE TO FACE EVALUATION WAS PERFORMED  KIRSTEINS,ANDREW E 10/18/2012, 10:05 AM

## 2012-10-19 ENCOUNTER — Inpatient Hospital Stay (HOSPITAL_COMMUNITY): Payer: BC Managed Care – PPO | Admitting: Speech Pathology

## 2012-10-19 ENCOUNTER — Encounter (HOSPITAL_COMMUNITY): Payer: BC Managed Care – PPO | Admitting: Occupational Therapy

## 2012-10-19 ENCOUNTER — Inpatient Hospital Stay (HOSPITAL_COMMUNITY): Payer: BC Managed Care – PPO | Admitting: *Deleted

## 2012-10-19 DIAGNOSIS — G8110 Spastic hemiplegia affecting unspecified side: Secondary | ICD-10-CM

## 2012-10-19 DIAGNOSIS — I6330 Cerebral infarction due to thrombosis of unspecified cerebral artery: Secondary | ICD-10-CM

## 2012-10-19 DIAGNOSIS — I69998 Other sequelae following unspecified cerebrovascular disease: Secondary | ICD-10-CM

## 2012-10-19 DIAGNOSIS — R209 Unspecified disturbances of skin sensation: Secondary | ICD-10-CM

## 2012-10-19 NOTE — Plan of Care (Signed)
Problem: RH Ambulation Goal: LTG Patient will ambulate in controlled environment (PT) LTG: Patient will ambulate in a controlled environment, # of feet with assistance (PT).  Outcome: Not Met (add Reason) Patient currently ambulates 25-30' with max assist with R handrail; will continue to address at SNF. Goal: LTG Patient will ambulate in home environment (PT) LTG: Patient will ambulate in home environment, # of feet with assistance (PT).  Outcome: Not Met (add Reason) Patient currently ambulates 25-30' with max assist with R handrail; will continue to address at SNF.  Problem: RH Stairs Goal: LTG Patient will ambulate up and down stairs w/assist (PT) LTG: Patient will ambulate up and down # of stairs with assistance (PT)  Outcome: Not Met (add Reason) Patient currently negotiates one step with max assist with R handrail; will continue to address at [facility]

## 2012-10-19 NOTE — Social Work (Signed)
Awaiting insurance approval for pt to transfer to [facility].  Stephanie Mason to contact this worker when has approval.  Pt aware and waiting also.

## 2012-10-19 NOTE — Plan of Care (Signed)
Problem: RH Simple Meal Prep Goal: LTG Patient will perform simple meal prep w/assist (OT) LTG: Patient will perform simple meal prep with assistance, with/without cues (OT).  Outcome: Not Applicable Date Met:  10/19/12 Discharge to SNF and not a focus for the patient at this time.

## 2012-10-19 NOTE — Progress Notes (Signed)
Social Work Discharge Note Discharge Note  The overall goal for the admission was met for:   Discharge location: No- [facility]  Length of Stay: Yes-27 DAYS  Discharge activity level: Yes-MIN/MOD LEVEL  Home/community participation: Yes  Services provided included: MD, RD, PT, OT, SLP, RN, TR, Pharmacy, Neuropsych and SW  Financial Services: Private Insurance: [employer] [place]  Follow-up services arranged: Other: SHORT TERM NHP  Comments (or additional information):PLAN FOR HER TO GO TO NH THEN HOME ONCE LESS CARE DAUGHTER APPLYING FOR SSD FOR PT  Patient/Family verbalized understanding of follow-up arrangements: Yes  Individual responsible for coordination of the follow-up plan: SELF & DINAH-DAUGHTER  Confirmed correct DME delivered: Lucy Chris 10/19/2012    Lucy Chris

## 2012-10-19 NOTE — Progress Notes (Signed)
dischaerged to SNF via ambulance. OPt belongings sent with pt and flowers kept in soiled utility room for pick up. Stephanie Mason

## 2012-10-19 NOTE — Plan of Care (Signed)
Problem: RH Tub/Shower Transfers Goal: LTG Patient will perform tub/shower transfers w/assist (OT) LTG: Patient will perform tub/shower transfers with assist, with/without cues using equipment (OT)  Outcome: Not Applicable Date Met:  10/19/12 Discharge to SNF

## 2012-10-19 NOTE — Plan of Care (Signed)
Problem: RH Toilet Transfers Goal: LTG Patient will perform toilet transfers w/assist (OT) LTG: Patient will perform toilet transfers with assist, with/without cues using equipment (OT)  Outcome: Progressing Patient is min assist when traveling to right  And min-mod to the left

## 2012-10-19 NOTE — Progress Notes (Signed)
Occupational Therapy Discharge Summary  Patient Details  Name: Stephanie Mason MRN: [id number] Date of Birth: 1950-10-08  Today's Date: 10/19/2012 Time: 0900-0958 Time Calculation (min): 58 min  Patient has met 9 of 11 (2 LTGs N/A) long term goals due to improved activity tolerance, improved balance, postural control, ability to compensate for deficits, functional use of  LEFT lower extremity, improved attention and improved awareness.  Patient to discharge at [facility] Assist to [facility] Assist level.  Discharge plan changed back to SNF therefore caregiver training not at focus at this time.    Reasons goals not met: Patient occasionally requires mod assist when transferring to the toilet when traveling to her left and she requires Supervision for self feeding due to set up and vcs.  Patient continues to struggle with postural control, midline orientation, left inattention/leglect, absent LUE sensation, pain and increased tone in LLE & LUE all of which limit her ability to progress.  Recommendation:  Patient will benefit from ongoing skilled OT services in skilled nursing facility setting to continue to advance functional skills in the area of BADL, iADL and Reduce care partner burden.  Equipment: No equipment provided due to discharge to SNF  Reasons for discharge: discharge from hospital  Patient/family agrees with progress made and goals achieved: Yes  OT Discharge Precautions/Restrictions  Precautions Precautions: Fall Precaution Comments: dense L hemiplegia, left neglect, L air cast as of 2/27 Restrictions Weight Bearing Restrictions: No Pain Pain Assessment Pain Assessment: No/denies pain ADL Please refer to FIM Vision/Perception  Vision - History Baseline Vision: Wears glasses only for reading Patient Visual Report: No change from baseline Vision - Assessment Eye Alignment: Impaired (comment) Vision Assessment: Vision tested Ocular Range of Motion: Within Functional  Limits Alignment/Gaze Preference: Gaze right Tracking/Visual Pursuits: Decreased smoothness of vertical tracking;Decreased smoothness of horizontal tracking Visual Fields: Left visual field deficit Perception Perception: Impaired Inattention/Neglect: Does not attend to left visual field Praxis Praxis: Intact Praxis Impairment Details: Perseveration  Cognition Overall Cognitive Status: Impaired Arousal/Alertness: Awake/alert Orientation Level: Oriented X4 Attention: Sustained Sustained Attention: Impaired Memory: Impaired Memory Impairment: Decreased recall of new information Awareness: Impaired Awareness Impairment: Emergent impairment Problem Solving: Impaired Safety/Judgment: Impaired Sensation Sensation Light Touch: Impaired Detail Light Touch Impaired Details: Absent LUE Stereognosis: Impaired Detail Stereognosis Impaired Details: Absent LUE Hot/Cold: Impaired Detail Hot/Cold Impaired Details: Absent LUE Proprioception: Impaired Detail Proprioception Impaired Details: Absent LUE Coordination Gross Motor Movements are Fluid and Coordinated: No Fine Motor Movements are Fluid and Coordinated: No Coordination and Movement Description: Pt currently Brunnstrum stage I movement in the LUE during ADL. Motor  Motor Motor: Abnormal postural alignment and control;Hemiplegia Motor - Discharge Observations: Noted increase in tone in L adductors, hamstrings since evaluation; no clonus  Mobility Bed Mobility Bed Mobility: Supine to Sit;Sit to Supine;Sitting - Scoot to Edge of Bed Supine to Sit: With rails;HOB elevated;4: Min assist Supine to Sit Details: Manual facilitation for placement;Manual facilitation for weight shifting;Verbal cues for precautions/safety;Verbal cues for sequencing;Verbal cues for technique Sitting - Scoot to Edge of Bed: 5: Supervision Sitting - Scoot to [place] of Bed Details: Verbal cues for precautions/safety Sit to Supine: HOB elevated;With rail;4: Min  assist Sit to Supine - Details: Manual facilitation for placement;Manual facilitation for weight shifting;Verbal cues for precautions/safety;Verbal cues for technique;Verbal cues for sequencing Transfers Sit to Stand: 4: Min assist;With armrests;With upper extremity assist;From chair/3-in-1;From bed Sit to Stand Details: Manual facilitation for weight bearing;Manual facilitation for placement;Manual facilitation for weight shifting Stand to Sit: 4: Min assist;With upper  extremity assist;With armrests;To chair/3-in-1;To bed Stand to Sit Details (indicate cue type and reason): Manual facilitation for weight bearing;Manual facilitation for placement Squat Pivot Transfers: 4: Min assist;With armrests Squat Pivot Transfer Details: Manual facilitation for placement;Manual facilitation for weight bearing;Verbal cues for precautions/safety Squat Pivot Transfer Details (indicate cue type and reason): Patient benefits from [facility] facilitation for weight bearing through L LE Trunk/Postural Assessment   Cervical Assessment Cervical Assessment: Within Functional Limits Thoracic Assessment Thoracic Assessment: Within Functional Limits Lumbar Assessment Lumbar Assessment: Within Functional Limits Postural Control Postural Control: Deficits on evaluation Postural Limitations: Pt sits in a posterior pelvic tilt with increased left trun elongation and right trunk shorteneing.   Balance Balance Balance Assessed: Yes Static Sitting Balance Static Sitting - Balance Support: No upper extremity supported;Feet supported Static Sitting - Level of Assistance: 5: Stand by assistance Static Sitting - Comment/# of Minutes: 5-7 minutes Dynamic Sitting Balance Dynamic Sitting - Balance Support: Feet supported;No upper extremity supported Dynamic Sitting - Level of Assistance: 4: Min assist Dynamic Sitting Balance - Compensations: stand by assist going R, min assist going L Static Standing Balance Static Standing -  Balance Support: Right upper extremity supported;During functional activity Static Standing - Level of Assistance: 4: Min assist Dynamic Standing Balance Dynamic Standing - Balance Support: Right upper extremity supported;During functional activity Dynamic Standing - Level of Assistance: 4: Min assist Motor  Motor Motor: Abnormal postural alignment and control;Hemiplegia Motor - Discharge Observations: Noted increase in tone in L adductors, hamstrings since evaluation; no clonus  Mobility Bed Mobility Bed Mobility: Supine to Sit;Sit to Supine;Sitting - Scoot to Edge of Bed Supine to Sit: With rails;HOB elevated;4: Min assist Supine to Sit Details: Manual facilitation for placement;Manual facilitation for weight shifting;Verbal cues for precautions/safety;Verbal cues for sequencing;Verbal cues for technique Sitting - Scoot to Edge of Bed: 5: Supervision Sitting - Scoot to [place] of Bed Details: Verbal cues for precautions/safety Sit to Supine: HOB elevated;With rail;4: Min assist Sit to Supine - Details: Manual facilitation for placement;Manual facilitation for weight shifting;Verbal cues for precautions/safety;Verbal cues for technique;Verbal cues for sequencing Transfers Sit to Stand: 4: Min assist;With armrests;With upper extremity assist;From chair/3-in-1;From bed Sit to Stand Details: Manual facilitation for weight bearing;Manual facilitation for placement;Manual facilitation for weight shifting Stand to Sit: 4: Min assist;With upper extremity assist;With armrests;To chair/3-in-1;To bed Stand to Sit Details (indicate cue type and reason): Manual facilitation for weight bearing;Manual facilitation for placement Squat Pivot Transfers: 4: Min assist;With armrests Squat Pivot Transfer Details: Manual facilitation for placement;Manual facilitation for weight bearing;Verbal cues for precautions/safety Squat Pivot Transfer Details (indicate cue type and reason): Patient benefits from [facility]  facilitation for weight bearing through L LE Extremity/Trunk Assessment RUE Assessment RUE Assessment: Within Functional Limits LUE Assessment LUE Assessment: Exceptions to WFL LUE AROM (degrees) LUE Overall AROM Comments: Pt overall Brunnstrum stage I movment in the arm and hand.  Also patient noted to have ~1-1 1/2 finger width inferior GH subluxation LUE PROM (degrees) LUE Overall PROM Comments: PROM WFLs for all joints except shoulder painful at ~90 degrees flex and  abduction with facilitation of the scapula.  Patient with infreased UE fexor tone in shoulder.  See FIM for current functional status  SHAFFER, CHRISTINA 10/19/2012, 4:45 PM

## 2012-10-19 NOTE — Plan of Care (Signed)
Problem: RH Car Transfers Goal: LTG Patient will perform car transfers with assist (PT) LTG: Patient will perform car transfers with assistance (PT).  Outcome: Not Applicable Date Met:  10/19/12 Patient d/c to SNF, car transfer goal not applicable

## 2012-10-19 NOTE — Progress Notes (Signed)
Physical Therapy Discharge Summary  Patient Details  Name: Stephanie Mason MRN: [id number] Date of Birth: Sep 04, 1950  Today's Date: 10/19/2012 Time: 1015-1100 Time Calculation (min): 45 min  Patient has met 8 of 11 long term goals due to improved activity tolerance, improved balance, improved postural control, increased strength, decreased pain, ability to compensate for deficits, functional use of  left upper extremity and left lower extremity, improved attention, improved awareness and improved coordination.  Patient to discharge at a wheelchair level Min Assist.   Patient's care partner not necessary secondary to d/c to SNF to provide the necessary physical and cognitive assistance at discharge.  Reasons goals not met: Ambulation and stair negotiation goals not met secondary to decreased standing balance, decreased postural control in standing, decreased strength, and overall slow progress. Patient continues to require max assist for ambulation and stair negotiation.  Recommendation:  Patient will benefit from ongoing skilled PT services in skilled nursing facility setting to continue to advance safe functional mobility, address ongoing impairments in activity tolerance, balance, postural control, strength, attention, awareness, coordination, and minimize fall risk.  Equipment: No equipment provided  Reasons for discharge: treatment goals met and discharge from hospital  Patient/family agrees with progress made and goals achieved: Yes  PT Discharge Precautions/Restrictions Precautions Precautions: Fall Precaution Comments: dense L hemiplegia, left neglect, L air cast as of 2/27 Restrictions Weight Bearing Restrictions: No Pain Pain Assessment Pain Assessment: No/denies pain Pain Score: 0-No pain Vision/Perception  Vision - History Baseline Vision: Wears glasses only for reading Patient Visual Report: No change from baseline Vision - Assessment Eye Alignment: Impaired  (comment) Ocular Range of Motion: Within Functional Limits Alignment/Gaze Preference: Gaze right Visual Fields: Left visual field deficit Perception Perception: Impaired Inattention/Neglect: Does not attend to left visual field Cognition Overall Cognitive Status: Appears within functional limits for tasks assessed Arousal/Alertness: Awake/alert Orientation Level: Oriented X4 Sensation Sensation Light Touch: Impaired Detail Light Touch Impaired Details: Impaired LLE;Absent LLE Stereognosis: Impaired Detail Stereognosis Impaired Details: Absent LUE Hot/Cold: Impaired Detail Hot/Cold Impaired Details: Absent LUE Proprioception: Impaired Detail Proprioception Impaired Details: Absent LLE Additional Comments: L LE intact proximally, absent from ankle and distally. Proprioception absent at L ankle and great toe Coordination Gross Motor Movements are Fluid and Coordinated: No Fine Motor Movements are Fluid and Coordinated: No Coordination and Movement Description: Decreased speed and accuracy with movements, no active movement to test for L UE and L LE Heel Shin Test: Intact R LE, unable L LE Motor  Motor Motor: Abnormal postural alignment and control;Hemiplegia Motor - Discharge Observations: Noted increase in tone in L adductors, hamstrings since evaluation; no clonus  Mobility Bed Mobility Bed Mobility: Supine to Sit;Sit to Supine;Sitting - Scoot to Edge of Bed Supine to Sit: With rails;HOB elevated;4: Min assist Supine to Sit Details: Manual facilitation for placement;Manual facilitation for weight shifting;Verbal cues for precautions/safety;Verbal cues for sequencing;Verbal cues for technique Sitting - Scoot to Edge of Bed: 5: Supervision Sitting - Scoot to [place] of Bed Details: Verbal cues for precautions/safety Sit to Supine: HOB elevated;With rail;4: Min assist Sit to Supine - Details: Manual facilitation for placement;Manual facilitation for weight shifting;Verbal cues for  precautions/safety;Verbal cues for technique;Verbal cues for sequencing Transfers Sit to Stand: 4: Min assist;With armrests;With upper extremity assist;From chair/3-in-1;From bed Sit to Stand Details: Manual facilitation for weight bearing;Manual facilitation for placement;Manual facilitation for weight shifting Stand to Sit: 4: Min assist;With upper extremity assist;With armrests;To chair/3-in-1;To bed Stand to Sit Details (indicate cue type and reason): Manual facilitation for weight  bearing;Manual facilitation for placement Squat Pivot Transfers: 4: Min assist;With armrests Squat Pivot Transfer Details: Manual facilitation for placement;Manual facilitation for weight bearing;Verbal cues for precautions/safety Squat Pivot Transfer Details (indicate cue type and reason): Patient benefits from [facility] facilitation for weight bearing through L LE Locomotion  Ambulation Ambulation: Yes Ambulation/Gait Assistance: 2: Max assist Ambulation Distance (Feet): 30 Feet Assistive device: Other (Comment) (R handrail) Ambulation/Gait Assistance Details: Verbal cues for sequencing;Verbal cues for technique;Verbal cues for precautions/safety;Verbal cues for gait pattern;Verbal cues for safe use of DME/AE;Tactile cues for posture;Manual facilitation for weight shifting;Manual facilitation for placement;Manual facilitation for weight bearing Ambulation/Gait Assistance Details: No L DF ace wrap needed secondary to patient with L air cast which assists in maintaining L ankle in enough DF during gait training. Patient requires assistance for upright posture, midline orientation, L knee stabilization during stance phase, proper advancement and placement of L foot, and lateral weight shifts. Patient with improved ability to advance L LE with decreased assistance required. Gait Gait: Yes Gait Pattern: Step-to pattern;Decreased step length - right;Decreased step length - left;Decreased stance time - left;Decreased  stride length;Decreased hip/knee flexion - left;Decreased dorsiflexion - left;Narrow base of support;Trunk flexed;Decreased weight shift to left;Lateral trunk lean to left Stairs / Additional Locomotion Stairs: Yes Stairs Assistance: 2: Max assist Stairs Assistance Details: Manual facilitation for weight shifting;Manual facilitation for placement;Manual facilitation for weight bearing;Verbal cues for precautions/safety;Verbal cues for technique;Verbal cues for sequencing;Tactile cues for posture Stairs Assistance Details (indicate cue type and reason): Patient requires max assist for upright posture, midline orientation, and advancement, placement, and stabilization of L LE. Stair Management Technique: One rail Right;Backwards;Forwards;Step to pattern Number of Stairs: 1 Height of Stairs: 6 Wheelchair Mobility Wheelchair Mobility: Yes Wheelchair Assistance: 5: Financial planner Details: Verbal cues for Engineer, drilling: Right upper extremity;Right lower extremity Wheelchair Parts Management: Needs assistance Distance: 155  Trunk/Postural Assessment  Cervical Assessment Cervical Assessment: Within Functional Limits Thoracic Assessment Thoracic Assessment: Within Functional Limits Lumbar Assessment Lumbar Assessment: Within Functional Limits Postural Control Postural Control: Deficits on evaluation Postural Limitations: Pt sits in a posterior pelvic tilt with increased left trun elongation and right trunk shorteneing.  Balance Balance Balance Assessed: Yes Static Sitting Balance Static Sitting - Balance Support: No upper extremity supported;Feet supported Static Sitting - Level of Assistance: 5: Stand by assistance Static Sitting - Comment/# of Minutes: 5-7 minutes Dynamic Sitting Balance Dynamic Sitting - Balance Support: Feet supported;No upper extremity supported Dynamic Sitting - Level of Assistance: 4: Min assist Dynamic Sitting Balance -  Compensations: stand by assist going R, min assist going L Static Standing Balance Static Standing - Balance Support: Right upper extremity supported;During functional activity Static Standing - Level of Assistance: 4: Min assist Dynamic Standing Balance Dynamic Standing - Balance Support: Right upper extremity supported;During functional activity Dynamic Standing - Level of Assistance: 4: Min assist Extremity Assessment  RLE Assessment RLE Assessment: Within Functional Limits LLE Assessment LLE Assessment: Exceptions to [facility] LLE Strength LLE Overall Strength: Deficits LLE Overall Strength Comments: Grossly 2/5 strength; Greatest ROM against gravity in L hip flexion.  See FIM for current functional status  Chipper Herb. Ripa, PT, DPT  10/19/2012, 12:24 PM

## 2012-10-19 NOTE — Plan of Care (Signed)
Problem: RH Eating Goal: LTG Patient will perform eating w/assist, cues/equip (OT) LTG: Patient will perform eating with assist, with/without cues using equipment (OT)  Outcome: Adequate for Discharge Patient requires supervision/set up

## 2012-10-19 NOTE — Progress Notes (Signed)
Patient ID: Stephanie Mason, female   DOB: Jan 09, 1951, 62 y.o.   MRN: [id number] Stephanie Mason is a 62 y.o. 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 Admitted 09/15/2012 to [facility] with left-sided weakness. Patient with noted seizure in the ED and received Ativan as well as loaded with Keppra. MRI of the brain showed large acute partially hemorrhagic right hemispheric infarct superimposed upon remote infarct. Patient did receive TPA. Echocardiogram with normal left ventricular function no source of embolus. EEG showed no seizure activity. Carotid Dopplers with no ICA stenosis. TCD with right terminal ICA stenosis. Patient was transferred to [facility] for ongoing evaluation. Placed on aspirin therapy for stroke prophylaxis as well as subcutaneous Lovenox for DVT prophylaxis. TEE completed 09/22/2012 showing no PFO or thrombus. Maintained on a dysphagia 3 thin liquid diet. Chest x-ray with questionable mass that was ruled out with CT of the chest  Subjective/Complaints: Objective: Left wrist pain I am leaving today  Review of Systems  Neurological: Positive for sensory change and focal weakness. Negative for seizures.  All other systems reviewed and are negative.     Vital Signs: Blood pressure 103/62, pulse 64, temperature 97.2 F (36.2 C), temperature source Oral, resp. rate 18, height 5\' 7"  (1.702 m), weight 70.58 kg (155 lb 9.6 oz), SpO2 95.00%. No results found. No results found for this or any previous visit (from the past 72 hour(s)).   HEENT: Left lower facial droop Cardio: RRR and no extra sounds Resp: CTA B/L and unlabored GI: BS positive and non tender Extremity:  Pulses positive and No Edema Skin:   Intact and Other no pressure areas Neuro: Lethargic, Flat, Cranial Nerve Abnormalities L central VII, Abnormal Sensory Absent to LT, Proprio, Abnormal Motor 0/5 in LUE and 2-/5 LLE hip flexor only, Abnormal  FMC Ataxic/ dec FMC, Reflexes: 0, Tone:  [place]worth 1+L elbow flexors,and wrist flexors , [place]worth 1 leg flexors  Mildly Dysarthric and Inattention to left Musc/Skel:  No pain or swelling in LLE with palpation or ROM, pain with left wrist extension Gen NAD   Assessment/Plan: 1. Functional deficits secondary to Right MCA infarct with hemorrhage ready for D/C to SNF  See me in 3 weeks MD at SNF will manage FIM: FIM - Bathing Bathing Steps Patient Completed: Chest;Left Arm;Abdomen;Front perineal area;Right upper leg;Left upper leg;Right lower leg (including foot);Left lower leg (including foot) Bathing: 5: Set-up assist to: Obtain items  FIM - Upper Body Dressing/Undressing Upper body dressing/undressing steps patient completed: Thread/unthread right sleeve of pullover shirt/dresss;Thread/unthread left sleeve of pullover shirt/dress;Put head through opening of pull over shirt/dress;Pull shirt over trunk Upper body dressing/undressing: 4: Min-Patient completed 75 plus % of tasks FIM - Lower Body Dressing/Undressing Lower body dressing/undressing steps patient completed: Thread/unthread right pants leg;Don/Doff right shoe;Pull pants up/down Lower body dressing/undressing: 3: Mod-Patient completed 50-74% of tasks  FIM - Toileting Toileting steps completed by patient: Adjust clothing prior to toileting;Performs perineal hygiene;Adjust clothing after toileting Toileting Assistive Devices: Grab bar or rail for support Toileting: 4: Steadying assist  FIM - Diplomatic Services operational officer Devices: Grab bars Toilet Transfers: 3-To toilet/BSC: Mod A (lift or lower assist);3-From toilet/BSC: Mod A (lift or lower assist)  FIM - Bed/Chair Transfer Bed/Chair Transfer Assistive Devices: Arm rests Bed/Chair Transfer: 3: Supine > Sit: Mod A (lifting assist/Pt. 50-74%/lift 2 legs;3: Sit > Supine: Mod A (lifting assist/Pt. 50-74%/lift 2 legs)  FIM - Locomotion: Wheelchair Distance:  155 Locomotion: Wheelchair: 2: [employer]  50 - 149 ft with supervision, cueing or coaxing FIM - Locomotion: Ambulation Locomotion: Ambulation Assistive Devices: Walker - Hemi Ambulation/Gait Assistance: 2: Max assist Locomotion: Ambulation: 0: Activity did not occur  Comprehension Comprehension Mode: Auditory Comprehension: 5-Follows basic conversation/direction: With no assist  Expression Expression Mode: Verbal Expression: 5-Expresses basic needs/ideas: With no assist  Social Interaction Social Interaction Mode: Asleep Social Interaction: 6-Interacts appropriately with others with medication or extra time (anti-anxiety, antidepressant).  Problem Solving Problem Solving Mode: Not assessed Problem Solving: 4-Solves basic 75 - 89% of the time/requires cueing 10 - 24% of the time  Memory Memory Mode: Asleep Memory: 5-Recognizes or recalls 90% of the time/requires cueing < 10% of the time  Medical Problem List and Plan:  1. Partially hemorrhagic right MCA infarct superimposed upon remote right brain infarct with dense left hemiparesthesias and visual spatial deficits , developing intermittent painful spasmsTitrate  Zanaflex, titrate gabapentin for painful parasthesias. Feeling better since meds adjusted Discussed usual pattern of recovery 2. DVT Prophylaxis/Anticoagulation: Subcutaneous Lovenox.no signas of DVT ,Monitor platelet counts and any signs of bleeding  3. Pain Management: Ultram as needed. Left ankle sprain, Ice and air cast, order L wrist spint 4. Neuropsych: This patient is not capable of making decisions on his/her own behalf.  5. Seizure disorder. Keppra 500 mg every 12 hours. EEG negative  6. Hypertension. Avapro 150 mg daily. Monitor with increased activity 7.  Dysphagia, pocketing soft textures and staff supervision 8.  Macrocytosis with borderline low RBCs MMA nl, RBC folate nl, admits to 1 beer a day at home-monitor 9.  Poor attention, ritalin trial with some benefit,  increase dose, add SSRI for longer term use 10.  Urinary incont mainly urgency, trial ditropan at noc LOS (Days) 27 A FACE TO FACE EVALUATION WAS PERFORMED  KIRSTEINS,ANDREW E 10/19/2012, 7:59 AM

## 2012-10-19 NOTE — Progress Notes (Signed)
Speech Language Pathology Daily Session Note & Discharge Summary  Patient Details  Name: Stephanie Mason MRN: [id number] Date of Birth: August 24, 1950  Today's Date: 10/19/2012 Time: 1100-1130 Time Calculation (min): 30 min  Short Term Goals: Week 4: SLP Short Term Goal 1 (Week 4): Patient will attend to left upper extremity and environment with supervision level verbal cues. SLP Short Term Goal 2 (Week 4): Patient will request help as needed with various tasks with supervision level question cues. SLP Short Term Goal 3 (Week 4): Patient will utilize memory compensatory strategies with min assist question cues SLP Short Term Goal 4 (Week 4): Patient will self monitor and correct errors with moderately complex tasks with min assist question cues  Skilled Therapeutic Interventions: Skilled treatment session focused on addressing cognitive goals. SLP facilitated session by providing mod assist semantic and verbal cues to self-monitor and correct errors with visuospatial tasks by alternating attention between different items while packing her belongings.  Patient required increased cues to attend to items in her left visual field. Patient was able to verbally recall where personal items were packed with min assist question cues.     FIM:  Comprehension Comprehension Mode: Auditory Comprehension: 5-Understands complex 90% of the time/Cues < 10% of the time Expression Expression Mode: Verbal Expression: 5-Expresses basic needs/ideas: With extra time/assistive device Social Interaction Social Interaction: 5-Interacts appropriately 90% of the time - Needs monitoring or encouragement for participation or interaction. Problem Solving Problem Solving: 4-Solves basic 75 - 89% of the time/requires cueing 10 - 24% of the time Memory Memory: 4-Recognizes or recalls 75 - 89% of the time/requires cueing 10 - 24% of the time  Pain Pain Assessment Pain Assessment: No/denies pain  Therapy/Group:  Individual Therapy   Speech Language Pathology Discharge Summary  Patient Details  Name: [name] MRN: [id number] Date of Birth: September 28, 1950  Today's Date: 10/19/2012  Patient has met 7 of 7 long term goals.  [facility] level.  Reasons goals not met: n/a   Clinical Impression/Discharge Summary: Patient met 7 out of 7 long term goals during CIR stay due to gains in swallow function, attention, awareness, problem solving and recall.  As a result patient has met all dysphagia goals, but continues to require min assist clinician cues with basic tasks and mod assist with moderately complex tasks.  Given continued memory and visual-spatial deficits patient would continue to benefit from skilled SLP services to reduce burden of care and family education needs to be completed prior to discharge home.     Care Partner:  Caregiver Able to Provide Assistance: No  Type of Caregiver Assistance: Physical;Cognitive  Recommendation:  Skilled Nursing facility;24 hour supervision/assistance  Rationale for SLP Follow Up: Maximize cognitive function and independence;Reduce caregiver burden   Equipment: none   Reasons for discharge: Treatment goals met;Discharged from hospital   Patient/Family Agrees with Progress Made and Goals Achieved: Yes   See FIM for current functional status  Charlane Ferretti., CCC-SLP [phone]  BOWIE,MELISSA 10/19/2012, 4:30 PM

## 2012-10-19 NOTE — Progress Notes (Signed)
Report called to  [facility] [id number] with report of care and needs. No questions noted. Pt ready for discharge. Pamelia Hoit, RN

## 2012-11-13 ENCOUNTER — Inpatient Hospital Stay: Payer: BC Managed Care – PPO | Admitting: Physical Medicine & Rehabilitation

## 2012-12-01 ENCOUNTER — Ambulatory Visit (HOSPITAL_BASED_OUTPATIENT_CLINIC_OR_DEPARTMENT_OTHER): Payer: BC Managed Care – PPO | Admitting: Physical Medicine & Rehabilitation

## 2012-12-01 ENCOUNTER — Encounter: Payer: Self-pay | Admitting: Physical Medicine & Rehabilitation

## 2012-12-01 ENCOUNTER — Encounter: Payer: BC Managed Care – PPO | Attending: Physical Medicine & Rehabilitation

## 2012-12-01 VITALS — BP 106/56 | HR 91 | Resp 14 | Ht 67.0 in | Wt 148.6 lb

## 2012-12-01 DIAGNOSIS — R209 Unspecified disturbances of skin sensation: Secondary | ICD-10-CM | POA: Insufficient documentation

## 2012-12-01 DIAGNOSIS — I69959 Hemiplegia and hemiparesis following unspecified cerebrovascular disease affecting unspecified side: Secondary | ICD-10-CM | POA: Insufficient documentation

## 2012-12-01 DIAGNOSIS — G90519 Complex regional pain syndrome I of unspecified upper limb: Secondary | ICD-10-CM

## 2012-12-01 DIAGNOSIS — I69998 Other sequelae following unspecified cerebrovascular disease: Secondary | ICD-10-CM | POA: Insufficient documentation

## 2012-12-01 DIAGNOSIS — G8190 Hemiplegia, unspecified affecting unspecified side: Secondary | ICD-10-CM

## 2012-12-01 DIAGNOSIS — I10 Essential (primary) hypertension: Secondary | ICD-10-CM | POA: Insufficient documentation

## 2012-12-01 NOTE — Progress Notes (Signed)
Subjective:    Patient ID: Stephanie Mason, female    DOB: 1950-08-17, 62 y.o.   MRN: [id number]  HPI This is a 62 year old right-handed female  with history of hypertension, CVA 22 years ago with little residual, was  admitted on February 60, 2014 to [facility] with left-  sided weakness. The patient with noted seizure in the emergency  department, received Ativan as well as loaded with Keppra. MRI of the  brain showed large acute partially hemorrhagic right hemispheric  infarcts, superimposed upon remote infarct. The patient did receive t-  PA. Echocardiogram with normal left ventricular function. No source of  embolus. EEG showed no seizure activity. Carotid Dopplers with no ICA  stenosis. Transcranial Dopplers with right terminal ICA stenosis   Pain Inventory Average Pain 7 Pain Right Now 7 My pain is burning and aching  In the last 24 hours, has pain interfered with the following? General activity 4 Relation with others 4 Enjoyment of life 5 What TIME of day is your pain at its worst? night Sleep (in general) Fair  Pain is worse with: some activites Pain improves with: medication Relief from Meds: 7  Mobility walk with assistance use a walker ability to climb steps?  yes do you drive?  no use a wheelchair needs help with transfers  Function disabled: date disabled . I need assistance with the following:  dressing, bathing, toileting, meal prep, household duties and shopping  Neuro/Psych trouble walking  Prior Studies Any changes since last visit?  no  Physicians involved in your care Any changes since last visit?  no   History reviewed. No pertinent family history. History   Social History  . Marital Status: Single    Spouse Name: N/A    Number of Children: N/A  . Years of Education: N/A   Social History Main Topics  . Smoking status: Never Smoker   . Smokeless tobacco: Never Used  . Alcohol Use: No  . Drug Use: No  . Sexually  Active: None   Other Topics Concern  . None   Social History Narrative  . None   Past Surgical History  Procedure Laterality Date  . Tee without cardioversion N/A 09/22/2012    Procedure: TRANSESOPHAGEAL ECHOCARDIOGRAM (TEE);  Surgeon: [name], MD;  Location: [facility] ENDOSCOPY;  Service: Cardiovascular;  Laterality: N/A;   Past Medical History  Diagnosis Date  . Stroke   . Hypertension    BP 106/56  Pulse 91  Resp 14  Ht 5\' 7"  (1.702 m)  Wt 148 lb 9.6 oz (67.405 kg)  BMI 23.27 kg/m2  SpO2 97%   Review of Systems  Musculoskeletal: Positive for gait problem.  All other systems reviewed and are negative.       Objective:   Physical Exam  Nursing note and vitals reviewed. Constitutional: She is oriented to person, place, and time. She appears well-developed and well-nourished.  HENT:  Head: Normocephalic and atraumatic.  Eyes: Conjunctivae are normal. Pupils are equal, round, and reactive to light.  Neck: Normal range of motion.  Neurological: She is alert and oriented to person, place, and time. A sensory deficit is present. She exhibits abnormal muscle tone. Coordination abnormal.  Left upper extremity 0/5 strength in the deltoid, biceps, triceps, grip Left lower extremity trace hip extension knee extensors energy otherwise 0/5 strength Sensory hyperalgesia to touch in the left hand as well as left foot. She also has pain with left shoulder range of motion There is swelling of the  dorsum of the hand as well as of the foot There is no erythema or ecchymosis.  Psychiatric: She has a normal mood and affect.          Assessment & Plan:  1. 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

## 2012-12-01 NOTE — Patient Instructions (Signed)
See written instructions as well as order

## 2012-12-02 ENCOUNTER — Telehealth: Payer: Self-pay

## 2012-12-02 NOTE — Telephone Encounter (Signed)
Stephanie Mason with the [facility] called to see if she could get a phone number for the patient so she could inform her she needs to make a payment in order to keep her tags.  [name] we could not give out that information.  Left message for patient to call office regarding this information.

## 2012-12-13 ENCOUNTER — Encounter (HOSPITAL_COMMUNITY): Payer: Self-pay | Admitting: Emergency Medicine

## 2012-12-13 ENCOUNTER — Emergency Department (HOSPITAL_COMMUNITY)
Admission: EM | Admit: 2012-12-13 | Discharge: 2012-12-13 | Disposition: A | Discharge status: Home / Self Care | Payer: BC Managed Care – PPO | Attending: Emergency Medicine | Admitting: Emergency Medicine

## 2012-12-13 DIAGNOSIS — Z8673 Personal history of transient ischemic attack (TIA), and cerebral infarction without residual deficits: Secondary | ICD-10-CM | POA: Insufficient documentation

## 2012-12-13 DIAGNOSIS — I10 Essential (primary) hypertension: Secondary | ICD-10-CM | POA: Insufficient documentation

## 2012-12-13 DIAGNOSIS — M6281 Muscle weakness (generalized): Secondary | ICD-10-CM | POA: Insufficient documentation

## 2012-12-13 DIAGNOSIS — I69959 Hemiplegia and hemiparesis following unspecified cerebrovascular disease affecting unspecified side: Secondary | ICD-10-CM | POA: Insufficient documentation

## 2012-12-13 DIAGNOSIS — IMO0002 Reserved for concepts with insufficient information to code with codable children: Secondary | ICD-10-CM | POA: Insufficient documentation

## 2012-12-13 DIAGNOSIS — R600 Localized edema: Secondary | ICD-10-CM

## 2012-12-13 DIAGNOSIS — M79609 Pain in unspecified limb: Secondary | ICD-10-CM | POA: Insufficient documentation

## 2012-12-13 DIAGNOSIS — R609 Edema, unspecified: Secondary | ICD-10-CM | POA: Insufficient documentation

## 2012-12-13 DIAGNOSIS — Z7982 Long term (current) use of aspirin: Secondary | ICD-10-CM | POA: Insufficient documentation

## 2012-12-13 DIAGNOSIS — Z79899 Other long term (current) drug therapy: Secondary | ICD-10-CM | POA: Insufficient documentation

## 2012-12-13 HISTORY — DX: Hemiplegia, unspecified affecting left nondominant side: G81.94

## 2012-12-13 LAB — COMPREHENSIVE METABOLIC PANEL
AST: 11 U/L (ref 0–37)
Albumin: 3.2 g/dL — ABNORMAL LOW (ref 3.5–5.2)
Chloride: 103 mEq/L (ref 96–112)
Creatinine, Ser: 0.66 mg/dL (ref 0.50–1.10)
Potassium: 4 mEq/L (ref 3.5–5.1)
Total Bilirubin: 0.2 mg/dL — ABNORMAL LOW (ref 0.3–1.2)

## 2012-12-13 LAB — CBC WITH DIFFERENTIAL/PLATELET
Basophils Absolute: 0 10*3/uL (ref 0.0–0.1)
Basophils Relative: 0 % (ref 0–1)
MCHC: 32.6 g/dL (ref 30.0–36.0)
Monocytes Absolute: 0.6 10*3/uL (ref 0.1–1.0)
Neutro Abs: 5 10*3/uL (ref 1.7–7.7)
Neutrophils Relative %: 70 % (ref 43–77)
RDW: 15.1 % (ref 11.5–15.5)

## 2012-12-13 MED ORDER — ENOXAPARIN SODIUM 100 MG/ML ~~LOC~~ SOLN
70.0000 mg | Freq: Once | SUBCUTANEOUS | Status: AC
  Administered 2012-12-13: 70 mg via SUBCUTANEOUS
  Filled 2012-12-13: qty 1

## 2012-12-13 MED ORDER — OXYCODONE-ACETAMINOPHEN 5-325 MG PO TABS
1.0000 | ORAL_TABLET | Freq: Once | ORAL | Status: AC
  Administered 2012-12-13: 1 via ORAL
  Filled 2012-12-13: qty 1

## 2012-12-13 MED ORDER — OXYCODONE-ACETAMINOPHEN 5-325 MG PO TABS: 1.0000 | ORAL_TABLET | ORAL | Status: DC | PRN

## 2012-12-13 NOTE — ED Provider Notes (Signed)
History     CSN: [id number]  Arrival date & time 12/13/12  1478   First MD Initiated Contact with Patient 12/13/12 2005      Chief Complaint  Patient presents with  . Leg Pain  . Arm Pain    (Consider location/radiation/quality/duration/timing/severity/associated sxs/prior treatment) HPI History provided by pt.   Pt was admitted to hospital in February for acute CVA.  Has residual L-sided weakness and is non-ambulatory w/out assistance.  She has been experiencing pain in her LLE for several days, and yesterday developed associated edema.  Denies fever, skin changes, CP, SOB.  No trauma.  Has been on daily aspirin since her stroke.   No h/o DVT.   Past Medical History  Diagnosis Date  . Stroke   . Hypertension   . Left hemiparesis     Past Surgical History  Procedure Laterality Date  . Tee without cardioversion N/A 09/22/2012    Procedure: TRANSESOPHAGEAL ECHOCARDIOGRAM (TEE);  Surgeon: [name], MD;  Location: [facility] ENDOSCOPY;  Service: Cardiovascular;  Laterality: N/A;    No family history on file.  History  Substance Use Topics  . Smoking status: Never Smoker   . Smokeless tobacco: Never Used  . Alcohol Use: No    OB History   Grav Para Term Preterm Abortions TAB SAB Ect Mult Living                  Review of Systems  All other systems reviewed and are negative.    Allergies  Review of patient's allergies indicates no known allergies.  Home Medications   Current Outpatient Rx  Name  Route  Sig  Dispense  Refill  . acetaminophen (TYLENOL) 325 MG tablet   Oral   Take 325 mg by mouth daily as needed for pain. For pain         . aspirin EC 325 MG tablet   Oral   Take 325 mg by mouth daily.         . divalproex (DEPAKOTE SPRINKLES) 125 MG capsule   Oral   Take 250 mg by mouth at bedtime.         . fluticasone (FLONASE) 50 MCG/ACT nasal spray   Nasal   Place 1 spray into the nose daily as needed. For nasal congestion         . gabapentin  (NEURONTIN) 300 MG capsule   Oral   Take 300 mg by mouth 3 (three) times daily.         Marland Kitchen levETIRAcetam (KEPPRA) 500 MG tablet   Oral   Take 500 mg by mouth every 12 (twelve) hours.         Marland Kitchen losartan (COZAAR) 50 MG tablet   Oral   Take 50 mg by mouth daily.         Marland Kitchen oxybutynin (DITROPAN-XL) 5 MG 24 hr tablet   Oral   Take 5 mg by mouth daily.         . tizanidine (ZANAFLEX) 2 MG capsule   Oral   Take 2 mg by mouth 3 (three) times daily.         . traMADol (ULTRAM) 50 MG tablet   Oral   Take 50 mg by mouth every 6 (six) hours as needed for pain. For pain           BP 112/64  Pulse 104  Temp(Src) 98.5 F (36.9 C) (Oral)  Resp 20  SpO2 100%  Physical Exam  Nursing  note and vitals reviewed. Constitutional: She is oriented to person, place, and time. She appears well-developed and well-nourished. No distress.  HENT:  Head: Normocephalic and atraumatic.  Eyes:  Normal appearance  Neck: Normal range of motion.  Cardiovascular: Normal rate and regular rhythm.   Pulmonary/Chest: Effort normal and breath sounds normal. No respiratory distress.  Musculoskeletal: Normal range of motion.  LLE diffusely edematous compared to R.  Tenderness popliteal space and posterior thigh, as well as ankle.  No skin changes.  No pain w/ passive ROM.  1+ DP pulse bilaterally and sensation intact.   Neurological: She is alert and oriented to person, place, and time.  Skin: Skin is warm and dry. No rash noted.  Psychiatric: She has a normal mood and affect. Her behavior is normal.    ED Course  Procedures (including critical care time)  Labs Reviewed  COMPREHENSIVE METABOLIC PANEL - Abnormal; Notable for the following:    Glucose, Bld 145 (*)    Albumin 3.2 (*)    Alkaline Phosphatase 121 (*)    Total Bilirubin 0.2 (*)    All other components within normal limits  CBC WITH DIFFERENTIAL   No results found.   1. Lower extremity edema       MDM  61yo F on daily  aspirin s/p acute CVA in 09/2012, now non-ambulatory w/out assistance, presents w/ LLE edema and pain.  No CP or SOB.  Clinical concern for DVT.  Cr w/in nml range; pt received dose of lovenox.  Will d/c home and have her return in am for venous duplex.  She was instructed to return immediately if she develops CP/SOB.  9:16 PM         Otilio Miu, PA-C 12/13/12 [phone]

## 2012-12-13 NOTE — ED Provider Notes (Signed)
Medical screening examination/treatment/procedure(s) were performed by non-physician practitioner and as supervising physician I was immediately available for consultation/collaboration.   Rolan Bucco, MD 12/13/12 [phone]

## 2012-12-13 NOTE — ED Notes (Signed)
PT. REPORTS LEFT LEG AND LEFT FOREARM PAIN ONSET LAST NIGHT DENIES INJURY , ALSO RELATES " GAS PAIN " , STATES HISTORY OF CVA LAST FEB. 2014 WITH LEFT HEMIPARESIS. DENIES CHEST PAIN OR SOB.

## 2012-12-13 NOTE — ED Notes (Signed)
Pt discharged.Vital signs stable and GCS 15 

## 2012-12-14 ENCOUNTER — Ambulatory Visit (HOSPITAL_COMMUNITY)
Admission: RE | Admit: 2012-12-14 | Discharge: 2012-12-14 | Disposition: A | Discharge status: Home / Self Care | Payer: BC Managed Care – PPO | Source: Ambulatory Visit | Attending: Family Medicine | Admitting: Family Medicine

## 2012-12-14 ENCOUNTER — Encounter (HOSPITAL_COMMUNITY): Payer: Self-pay | Admitting: Cardiology

## 2012-12-14 ENCOUNTER — Emergency Department (HOSPITAL_COMMUNITY)
Admission: EM | Admit: 2012-12-14 | Discharge: 2012-12-14 | Disposition: A | Discharge status: Home / Self Care | Payer: BC Managed Care – PPO | Attending: Emergency Medicine | Admitting: Emergency Medicine

## 2012-12-14 DIAGNOSIS — I82402 Acute embolism and thrombosis of unspecified deep veins of left lower extremity: Secondary | ICD-10-CM

## 2012-12-14 DIAGNOSIS — I82409 Acute embolism and thrombosis of unspecified deep veins of unspecified lower extremity: Secondary | ICD-10-CM | POA: Insufficient documentation

## 2012-12-14 DIAGNOSIS — Z7982 Long term (current) use of aspirin: Secondary | ICD-10-CM | POA: Insufficient documentation

## 2012-12-14 DIAGNOSIS — M79609 Pain in unspecified limb: Secondary | ICD-10-CM | POA: Insufficient documentation

## 2012-12-14 DIAGNOSIS — I10 Essential (primary) hypertension: Secondary | ICD-10-CM | POA: Insufficient documentation

## 2012-12-14 DIAGNOSIS — Z8673 Personal history of transient ischemic attack (TIA), and cerebral infarction without residual deficits: Secondary | ICD-10-CM | POA: Insufficient documentation

## 2012-12-14 DIAGNOSIS — I69959 Hemiplegia and hemiparesis following unspecified cerebrovascular disease affecting unspecified side: Secondary | ICD-10-CM | POA: Insufficient documentation

## 2012-12-14 DIAGNOSIS — Z79899 Other long term (current) drug therapy: Secondary | ICD-10-CM | POA: Insufficient documentation

## 2012-12-14 DIAGNOSIS — I871 Compression of vein: Secondary | ICD-10-CM | POA: Insufficient documentation

## 2012-12-14 DIAGNOSIS — Z87820 Personal history of traumatic brain injury: Secondary | ICD-10-CM | POA: Insufficient documentation

## 2012-12-14 DIAGNOSIS — M25579 Pain in unspecified ankle and joints of unspecified foot: Secondary | ICD-10-CM | POA: Insufficient documentation

## 2012-12-14 DIAGNOSIS — R109 Unspecified abdominal pain: Secondary | ICD-10-CM | POA: Insufficient documentation

## 2012-12-14 DIAGNOSIS — M7989 Other specified soft tissue disorders: Secondary | ICD-10-CM | POA: Insufficient documentation

## 2012-12-14 LAB — APTT: aPTT: 20 seconds — ABNORMAL LOW (ref 24–37)

## 2012-12-14 LAB — PROTIME-INR
INR: 1.08 (ref 0.00–1.49)
Prothrombin Time: 13.9 seconds (ref 11.6–15.2)

## 2012-12-14 MED ORDER — ENOXAPARIN (LOVENOX) PATIENT EDUCATION KIT
PACK | Freq: Once | Status: AC
  Administered 2012-12-14: 14:00:00
  Filled 2012-12-14: qty 1

## 2012-12-14 MED ORDER — ENOXAPARIN SODIUM 150 MG/ML ~~LOC~~ SOLN: 1.0000 mg/kg | Freq: Once | SUBCUTANEOUS | Status: DC

## 2012-12-14 MED ORDER — COUMADIN BOOK
Freq: Once | Status: AC
  Administered 2012-12-14: 13:00:00
  Filled 2012-12-14: qty 1

## 2012-12-14 MED ORDER — ENOXAPARIN SODIUM 80 MG/0.8ML ~~LOC~~ SOLN
70.0000 mg | Freq: Once | SUBCUTANEOUS | Status: AC
  Administered 2012-12-14: 70 mg via SUBCUTANEOUS
  Filled 2012-12-14: qty 0.8

## 2012-12-14 MED ORDER — WARFARIN SODIUM 7.5 MG PO TABS: 7.5000 mg | ORAL_TABLET | Freq: Every day | ORAL | Status: DC

## 2012-12-14 MED ORDER — WARFARIN SODIUM 7.5 MG PO TABS
7.5000 mg | ORAL_TABLET | ORAL | Status: AC
  Administered 2012-12-14: 7.5 mg via ORAL
  Filled 2012-12-14: qty 1

## 2012-12-14 MED ORDER — ENOXAPARIN SODIUM 100 MG/ML ~~LOC~~ SOLN: 70.0000 mg | Freq: Two times a day (BID) | SUBCUTANEOUS | Status: DC

## 2012-12-14 NOTE — Progress Notes (Signed)
VASCULAR LAB PRELIMINARY  PRELIMINARY  PRELIMINARY  PRELIMINARY  Left lower extremity venous Doppler completed.    Preliminary report:  There is acute, occlusive DVT noted in the left common femoral and femoral veins.  There is subacute occlusive DVT noted in the left popliteal and posterior tibial veins.  There is new, non occlusive, mobile thrombus noted in the right common femoral vein.  Destine Ambroise, RVT 12/14/2012, 10:00 AM

## 2012-12-14 NOTE — Progress Notes (Signed)
Pt with CVA, DVTs. Noted plans to d/c home on Lovenox bridging to Coumadin  Pt has been on Coumadin in the past, a while ago for CVA, Coumadin was d/c a few years ago.  Coumadin and Lovenox education completed. Questions answered, understanding verified with patient and daughter. - Will send Coumadin book and Lovenox teaching kit  Thanks, Mady Haagensen K. Allena Katz, PharmD, BCPS.  Clinical Pharmacist Pager [phone]. 12/14/2012 1:08 PM

## 2012-12-14 NOTE — ED Notes (Signed)
Pt reports she is here from [place] after receiving test to both her legs for possible blood clots. States she was positive in the left leg. Reports she was intially having pain in both legs. Denies any chest pain or SOB. Skin warm and dry. No distress noted.

## 2012-12-14 NOTE — ED Provider Notes (Signed)
History     CSN: [id number]  Arrival date & time 12/14/12  1010   First MD Initiated Contact with Patient 12/14/12 1037      Chief Complaint  Patient presents with  . Leg Pain    (Consider location/radiation/quality/duration/timing/severity/associated sxs/prior treatment) HPI Patient is a 62 y.o AAF with history of CVA  presents today with leg pain.  Patient was seen in the emergency department last night for this problem and was told to return today for venous Doppler to assess for DVT.  Study showed three DVTs in left lower extremity.  Patient continues to have left leg and groin pain.  She denies headache, chest pain, shortness of breath, cough, vision changes.  She has residual left sided weakness from CVA in February but denies new weakness and numbness.  Past Medical History  Diagnosis Date  . Stroke   . Hypertension   . Left hemiparesis     Past Surgical History  Procedure Laterality Date  . Tee without cardioversion N/A 09/22/2012    Procedure: TRANSESOPHAGEAL ECHOCARDIOGRAM (TEE);  Surgeon: [name], MD;  Location: [facility] ENDOSCOPY;  Service: Cardiovascular;  Laterality: N/A;    History reviewed. No pertinent family history.  History  Substance Use Topics  . Smoking status: Never Smoker   . Smokeless tobacco: Never Used  . Alcohol Use: No    OB History   Grav Para Term Preterm Abortions TAB SAB Ect Mult Living                  Review of Systems All other systems negative except as documented in the HPI. All pertinent positives and negatives as reviewed in the HPI. Allergies  Review of patient's allergies indicates no known allergies.  Home Medications   Current Outpatient Rx  Name  Route  Sig  Dispense  Refill  . acetaminophen (TYLENOL) 325 MG tablet   Oral   Take 325 mg by mouth daily as needed for pain. For pain         . aspirin EC 325 MG tablet   Oral   Take 325 mg by mouth daily.         . divalproex (DEPAKOTE SPRINKLES) 125 MG  capsule   Oral   Take 250 mg by mouth at bedtime.         . fluticasone (FLONASE) 50 MCG/ACT nasal spray   Nasal   Place 1 spray into the nose daily as needed. For nasal congestion         . gabapentin (NEURONTIN) 300 MG capsule   Oral   Take 300 mg by mouth 3 (three) times daily.         Marland Kitchen levETIRAcetam (KEPPRA) 500 MG tablet   Oral   Take 500 mg by mouth every 12 (twelve) hours.         Marland Kitchen losartan (COZAAR) 50 MG tablet   Oral   Take 50 mg by mouth daily.         Marland Kitchen oxybutynin (DITROPAN-XL) 5 MG 24 hr tablet   Oral   Take 5 mg by mouth daily.         Marland Kitchen oxyCODONE-acetaminophen (PERCOCET/ROXICET) 5-325 MG per tablet   Oral   Take 1 tablet by mouth every 4 (four) hours as needed for pain.   20 tablet   0   . tizanidine (ZANAFLEX) 2 MG capsule   Oral   Take 2 mg by mouth 3 (three) times daily.         Marland Kitchen  traMADol (ULTRAM) 50 MG tablet   Oral   Take 50 mg by mouth every 6 (six) hours as needed for pain.            BP 98/63  Pulse 89  Temp(Src) 97.8 F (36.6 C) (Oral)  Resp 15  SpO2 97%  Physical Exam  Constitutional: She is oriented to person, place, and time. She appears well-developed and well-nourished.  HENT:  Head: Normocephalic and atraumatic.  Mouth/Throat: Oropharynx is clear and moist.  Eyes: Conjunctivae and EOM are normal. Pupils are equal, round, and reactive to light.  Neck: Neck supple.  Cardiovascular: Normal rate, regular rhythm, normal heart sounds and intact distal pulses.  Exam reveals no friction rub.   No murmur heard. Pulmonary/Chest: Effort normal and breath sounds normal. No respiratory distress. She has no wheezes. She has no rales. She exhibits no tenderness.  Abdominal: Soft. Bowel sounds are normal. There is no tenderness.  Musculoskeletal:  There is tenderness with palpation of left ankle, calf, and groin.  Generalized edema of left leg compared to right.  Neurological: She is alert and oriented to person, place, and  time.  Generalized left-sided weakness  Skin: Skin is warm and dry.  Psychiatric: She has a normal mood and affect.    ED Course  Procedures (including critical care time)  Patient was started on Coumadin and Lovenox the pharmacist has gone and educated the patient and given doses of the medication.  Patient is advised to return to the emergency department for any worsening in her condition.  Advised him to follow up with her primary care doctor in the next 2 days for recheck.   MDM  MDM Reviewed: vitals, nursing note and previous chart Interpretation: ultrasound            Carlyle Dolly, PA-C 12/18/12 0050

## 2012-12-14 NOTE — ED Notes (Signed)
Spoke with Jennifer-pharmacy-she will contact pharmacist to provide pt education.

## 2012-12-14 NOTE — ED Notes (Signed)
Pt w/ hx of stroke and left sided weakness  Started to have leg pain on sat was seen here in er and sent for [place] todau and has 2 clots

## 2012-12-16 ENCOUNTER — Emergency Department (HOSPITAL_COMMUNITY)
Admission: EM | Admit: 2012-12-16 | Discharge: 2012-12-16 | Disposition: A | Discharge status: Home / Self Care | Payer: BC Managed Care – PPO | Attending: Emergency Medicine | Admitting: Emergency Medicine

## 2012-12-16 ENCOUNTER — Encounter (HOSPITAL_COMMUNITY): Payer: Self-pay | Admitting: Emergency Medicine

## 2012-12-16 DIAGNOSIS — I82409 Acute embolism and thrombosis of unspecified deep veins of unspecified lower extremity: Secondary | ICD-10-CM | POA: Insufficient documentation

## 2012-12-16 DIAGNOSIS — I82402 Acute embolism and thrombosis of unspecified deep veins of left lower extremity: Secondary | ICD-10-CM

## 2012-12-16 DIAGNOSIS — Z79899 Other long term (current) drug therapy: Secondary | ICD-10-CM | POA: Insufficient documentation

## 2012-12-16 DIAGNOSIS — I10 Essential (primary) hypertension: Secondary | ICD-10-CM | POA: Insufficient documentation

## 2012-12-16 DIAGNOSIS — Z8673 Personal history of transient ischemic attack (TIA), and cerebral infarction without residual deficits: Secondary | ICD-10-CM | POA: Insufficient documentation

## 2012-12-16 DIAGNOSIS — Z7901 Long term (current) use of anticoagulants: Secondary | ICD-10-CM | POA: Insufficient documentation

## 2012-12-16 DIAGNOSIS — Z8669 Personal history of other diseases of the nervous system and sense organs: Secondary | ICD-10-CM | POA: Insufficient documentation

## 2012-12-16 NOTE — ED Provider Notes (Signed)
History     CSN: [id number]  Arrival date & time 12/16/12  1355   First MD Initiated Contact with Patient 12/16/12 1457      Chief Complaint  Patient presents with  . Leg Pain    (Consider location/radiation/quality/duration/timing/severity/associated sxs/prior treatment) HPI Comments: 62 year old female with a history of recently diagnosed deep venous thrombosis of the left lower extremity who presents with a complaint of needing a recheck. She was told to followup within 48 hours to make sure everything was okay, she states she has had no complaints including no bleeding, no shortness of breath, no chest pain.     Patient is a 62 y.o. female presenting with leg pain. The history is provided by the patient, a relative and medical records.  Leg Pain   Past Medical History  Diagnosis Date  . Stroke   . Hypertension   . Left hemiparesis     Past Surgical History  Procedure Laterality Date  . Tee without cardioversion N/A 09/22/2012    Procedure: TRANSESOPHAGEAL ECHOCARDIOGRAM (TEE);  Surgeon: [name], MD;  Location: [facility] ENDOSCOPY;  Service: Cardiovascular;  Laterality: N/A;    No family history on file.  History  Substance Use Topics  . Smoking status: Never Smoker   . Smokeless tobacco: Never Used  . Alcohol Use: No    OB History   Grav Para Term Preterm Abortions TAB SAB Ect Mult Living                  Review of Systems  All other systems reviewed and are negative.    Allergies  Review of patient's allergies indicates no known allergies.  Home Medications   Current Outpatient Rx  Name  Route  Sig  Dispense  Refill  . acetaminophen (TYLENOL) 325 MG tablet   Oral   Take 325 mg by mouth daily as needed for pain. For pain         . aspirin EC 325 MG tablet   Oral   Take 325 mg by mouth daily.         . divalproex (DEPAKOTE SPRINKLES) 125 MG capsule   Oral   Take 250 mg by mouth at bedtime.         . enoxaparin (LOVENOX) 100 MG/ML  injection   Subcutaneous   Inject 0.7 mLs (70 mg total) into the skin every 12 (twelve) hours.   28 Syringe   0   . fluticasone (FLONASE) 50 MCG/ACT nasal spray   Nasal   Place 1 spray into the nose daily as needed. For nasal congestion         . gabapentin (NEURONTIN) 300 MG capsule   Oral   Take 300 mg by mouth 3 (three) times daily.         Marland Kitchen levETIRAcetam (KEPPRA) 500 MG tablet   Oral   Take 500 mg by mouth every 12 (twelve) hours.         Marland Kitchen losartan (COZAAR) 50 MG tablet   Oral   Take 50 mg by mouth daily.         Marland Kitchen oxybutynin (DITROPAN-XL) 5 MG 24 hr tablet   Oral   Take 5 mg by mouth daily.         Marland Kitchen oxyCODONE-acetaminophen (PERCOCET/ROXICET) 5-325 MG per tablet   Oral   Take 1 tablet by mouth every 4 (four) hours as needed for pain.   20 tablet   0   . tizanidine (ZANAFLEX) 2 MG  capsule   Oral   Take 2 mg by mouth 3 (three) times daily.         . traMADol (ULTRAM) 50 MG tablet   Oral   Take 50 mg by mouth every 6 (six) hours as needed for pain.          Marland Kitchen warfarin (COUMADIN) 7.5 MG tablet   Oral   Take 1 tablet (7.5 mg total) by mouth daily.   30 tablet   0     BP 90/49  Pulse 82  Temp(Src) 98.1 F (36.7 C)  Resp 16  SpO2 99%  Physical Exam  Nursing note and vitals reviewed. Constitutional: She appears well-developed and well-nourished. No distress.  HENT:  Head: Normocephalic and atraumatic.  Mouth/Throat: Oropharynx is clear and moist. No oropharyngeal exudate.  Eyes: Conjunctivae and EOM are normal. Pupils are equal, round, and reactive to light. Right eye exhibits no discharge. Left eye exhibits no discharge. No scleral icterus.  Neck: Normal range of motion. Neck supple. No JVD present. No thyromegaly present.  Cardiovascular: Normal rate, regular rhythm, normal heart sounds and intact distal pulses.  Exam reveals no gallop and no friction rub.   No murmur heard. Pulmonary/Chest: Effort normal and breath sounds normal. No  respiratory distress. She has no wheezes. She has no rales.  Abdominal: Soft. Bowel sounds are normal. She exhibits no distension and no mass. There is no tenderness.  Musculoskeletal: Normal range of motion. She exhibits edema ( Left greater than right lower extremity swelling). She exhibits no tenderness.  Lymphadenopathy:    She has no cervical adenopathy.  Neurological: She is alert. Coordination normal.  Skin: Skin is warm and dry. No rash noted. No erythema.  Psychiatric: She has a normal mood and affect. Her behavior is normal.    ED Course  Procedures (including critical care time)  Labs Reviewed - No data to display No results found.   1. DVT (deep venous thrombosis), left       MDM  The patient does have a soft blood pressure but has no fever, no tachycardia and states that she feels very well after starting on her medications. There has been no bleeding and she appears medically and clinically stable to followup with primary Dr. on Friday. She states that she will be able to followup at that time. She will return for chest pain shortness of breath or worsening symptoms.        Vida Roller, MD 12/16/12 [phone]

## 2012-12-16 NOTE — ED Notes (Signed)
Pt seen & Dx with DVT's to BLE on Monday and sent home with lovenox & coumadin. Pt instructed to follow up with her PCP. Pt misunderstood discharge instructions & returned to ED today.  Pt also reports PCP has changed, chart updated to current PCP.

## 2012-12-16 NOTE — ED Notes (Signed)
Was dx w/ bilateral clots in her legs on Monday has been taking the meds given but states she was told to come back for check up

## 2012-12-18 NOTE — ED Provider Notes (Signed)
Medical screening examination/treatment/procedure(s) were performed by non-physician practitioner and as supervising physician I was immediately available for consultation/collaboration.   Huma Imhoff J. Amel Gianino, MD 12/18/12 1731 

## 2012-12-28 ENCOUNTER — Encounter: Payer: BC Managed Care – PPO | Attending: Physical Medicine & Rehabilitation

## 2012-12-28 ENCOUNTER — Encounter: Payer: Self-pay | Admitting: Physical Medicine & Rehabilitation

## 2012-12-28 ENCOUNTER — Ambulatory Visit (HOSPITAL_BASED_OUTPATIENT_CLINIC_OR_DEPARTMENT_OTHER): Payer: BC Managed Care – PPO | Admitting: Physical Medicine & Rehabilitation

## 2012-12-28 VITALS — BP 101/62 | HR 82 | Resp 14 | Ht 67.0 in | Wt 148.6 lb

## 2012-12-28 DIAGNOSIS — I10 Essential (primary) hypertension: Secondary | ICD-10-CM | POA: Insufficient documentation

## 2012-12-28 DIAGNOSIS — M67332 Transient synovitis, left wrist: Secondary | ICD-10-CM

## 2012-12-28 DIAGNOSIS — I69998 Other sequelae following unspecified cerebrovascular disease: Secondary | ICD-10-CM | POA: Insufficient documentation

## 2012-12-28 DIAGNOSIS — G8190 Hemiplegia, unspecified affecting unspecified side: Secondary | ICD-10-CM

## 2012-12-28 DIAGNOSIS — G90519 Complex regional pain syndrome I of unspecified upper limb: Secondary | ICD-10-CM | POA: Insufficient documentation

## 2012-12-28 DIAGNOSIS — I6340 Cerebral infarction due to embolism of unspecified cerebral artery: Secondary | ICD-10-CM

## 2012-12-28 DIAGNOSIS — R209 Unspecified disturbances of skin sensation: Secondary | ICD-10-CM | POA: Insufficient documentation

## 2012-12-28 DIAGNOSIS — M6580 Other synovitis and tenosynovitis, unspecified site: Secondary | ICD-10-CM

## 2012-12-28 DIAGNOSIS — I69959 Hemiplegia and hemiparesis following unspecified cerebrovascular disease affecting unspecified side: Secondary | ICD-10-CM | POA: Insufficient documentation

## 2012-12-28 MED ORDER — TRAMADOL HCL 50 MG PO TABS: 50.0000 mg | ORAL_TABLET | Freq: Four times a day (QID) | ORAL | Status: DC | PRN

## 2012-12-28 MED ORDER — TIZANIDINE HCL 2 MG PO CAPS: 2.0000 mg | ORAL_CAPSULE | Freq: Three times a day (TID) | ORAL | Status: AC

## 2012-12-28 MED ORDER — DICLOFENAC SODIUM 1 % TD GEL: 2.0000 g | Freq: Four times a day (QID) | TRANSDERMAL | Status: DC

## 2012-12-28 NOTE — Progress Notes (Signed)
Subjective:    Patient ID: Stephanie Mason, female    DOB: Aug 30, 1950, 62 y.o.   MRN: [id number]  HPI This is a 62 year old right-handed female  with history of hypertension, CVA 22 years ago with little residual, was  admitted on September 15, 2012 to [facility] with left-  sided weakness. The patient with noted seizure in the emergency  department, received Ativan as well as loaded with Keppra. MRI of the  brain showed large acute partially hemorrhagic right hemispheric  infarcts, superimposed upon remote infarct. The patient did receive t-  PA. Echocardiogram with normal left ventricular function. No source of  embolus. EEG showed no seizure activity. Carotid Dopplers with no ICA  stenosis. Transcranial Dopplers with right terminal ICA stenosis  Seen by home health PT and OT. Initially seen by home health speech therapy but she was discharged because things checked out well.  Left shoulder pain mainly with movement. Wrist pain is improved. Left knee pain as well. No falls  Developed DVT right common femoral, left common femoral and superficial femoral as well as left popliteal and posterior tibial. On Coumadin. PCP stopped Ritalin Restarted in home health therapy this week after starting Coumadin. Pain Inventory Average Pain 8 Pain Right Now 8 My pain is constant  In the last 24 hours, has pain interfered with the following? General activity 4 Relation with others 4 Enjoyment of life 4 What TIME of day is your pain at its worst? night Sleep (in general) Fair  Pain is worse with: some activites Pain improves with: medication Relief from Meds: 7  Mobility walk with assistance needs help with transfers  Function disabled: date disabled . I need assistance with the following:  dressing, bathing, toileting, meal prep, household duties and shopping  Neuro/Psych bladder control problems tremor trouble walking spasms  Prior Studies Any changes since  last visit?  no  Physicians involved in your care Any changes since last visit?  no   History reviewed. No pertinent family history. History   Social History  . Marital Status: Single    Spouse Name: N/A    Number of Children: N/A  . Years of Education: N/A   Social History Main Topics  . Smoking status: Never Smoker   . Smokeless tobacco: Never Used  . Alcohol Use: No  . Drug Use: No  . Sexually Active: None   Other Topics Concern  . None   Social History Narrative  . None   Past Surgical History  Procedure Laterality Date  . Tee without cardioversion N/A 09/22/2012    Procedure: TRANSESOPHAGEAL ECHOCARDIOGRAM (TEE);  Surgeon: [name], MD;  Location[facility] ENDOSCOPY;  Service: Cardiovascular;  Laterality: N/A;   Past Medical History  Diagnosis Date  . Stroke   . Hypertension   . Left hemiparesis    BP 101/62  Pulse 82  Resp 14  Ht 5\' 7"  (1.702 m)  Wt 148 lb 9.6 oz (67.405 kg)  BMI 23.27 kg/m2    Review of Systems  Genitourinary:       Bladder control  Musculoskeletal: Positive for gait problem.       Spasms  Neurological: Positive for tremors.       Objective:   Physical Exam Trace movement left elbow flexion otherwise 0/5 Left lower extremity trace knee extensor hip extensor. 0/5 at the ankle Contracture at the finger extensors on the left side as well as the wrist extensor. Mild synovitis left wrist. Frozen left shoulder with decreased external  and internal rotation as well as abduction.       Assessment & Plan:  1. 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 Reflex sympathetic dystrophy left upper extremity and possibly left lower extremity  3. Dysesthetic pain secondary to CVA  4. Left frozen shoulder may need to inject in one month if not much better. 5. Bilateral DVT as noted above treatment by primary care physician with Coumadin. 6. Left wrist pain mild synovitis will start diclofenac  gel Continue Neurontin to 400 4 times a day  Discontinue Ritalin  RTC 1 month Over half of the 25 min visit was spent counseling and coordinating care.

## 2013-01-02 ENCOUNTER — Emergency Department: Payer: Self-pay | Admitting: Emergency Medicine

## 2013-01-04 ENCOUNTER — Other Ambulatory Visit: Payer: Self-pay | Admitting: Internal Medicine

## 2013-01-04 LAB — PROTIME-INR: INR: 3.9

## 2013-01-25 ENCOUNTER — Encounter: Payer: Self-pay | Admitting: Physical Medicine & Rehabilitation

## 2013-01-25 ENCOUNTER — Ambulatory Visit (HOSPITAL_BASED_OUTPATIENT_CLINIC_OR_DEPARTMENT_OTHER): Payer: BC Managed Care – PPO | Admitting: Physical Medicine & Rehabilitation

## 2013-01-25 ENCOUNTER — Encounter: Payer: BC Managed Care – PPO | Attending: Physical Medicine & Rehabilitation

## 2013-01-25 VITALS — BP 99/63 | HR 99 | Resp 14 | Ht 67.0 in | Wt 148.0 lb

## 2013-01-25 DIAGNOSIS — R209 Unspecified disturbances of skin sensation: Secondary | ICD-10-CM | POA: Insufficient documentation

## 2013-01-25 DIAGNOSIS — I69998 Other sequelae following unspecified cerebrovascular disease: Secondary | ICD-10-CM | POA: Insufficient documentation

## 2013-01-25 DIAGNOSIS — G90519 Complex regional pain syndrome I of unspecified upper limb: Secondary | ICD-10-CM | POA: Insufficient documentation

## 2013-01-25 DIAGNOSIS — M7500 Adhesive capsulitis of unspecified shoulder: Secondary | ICD-10-CM

## 2013-01-25 DIAGNOSIS — M7502 Adhesive capsulitis of left shoulder: Secondary | ICD-10-CM

## 2013-01-25 DIAGNOSIS — I69959 Hemiplegia and hemiparesis following unspecified cerebrovascular disease affecting unspecified side: Secondary | ICD-10-CM | POA: Insufficient documentation

## 2013-01-25 DIAGNOSIS — I10 Essential (primary) hypertension: Secondary | ICD-10-CM | POA: Insufficient documentation

## 2013-01-25 NOTE — Patient Instructions (Signed)
Left shoulder injection today. This will help mainly the shoulder but there may be some overflow to the elbow and wrist in terms of cortisone.  Left wrist pain secondary to arthritis  Left elbow pain secondary to tendinitis  Reevaluate in one month to see if there is signs of reflex sympathetic dystrophy

## 2013-01-25 NOTE — Progress Notes (Signed)
  Subjective:    Patient ID: Stephanie Mason, female    DOB: Jul 06, 1951, 62 y.o.   MRN: [id number]  HPI Left shoulder pain continues despite conservative care Pain Inventory Average Pain 8 Pain Right Now 0 My pain is aching  In the last 24 hours, has pain interfered with the following? General activity 9 Relation with others 0 Enjoyment of life 0 What TIME of day is your pain at its worst? night Sleep (in general) Fair  Pain is worse with: standing Pain improves with: rest Relief from Meds: 6  Mobility ability to climb steps?  no do you drive?  no use a wheelchair needs help with transfers  Function disabled: date disabled . I need assistance with the following:  dressing, bathing, toileting, meal prep, household duties and shopping  Neuro/Psych weakness tremor tingling  Prior Studies Any changes since last visit?  no  Physicians involved in your care Any changes since last visit?  no   History reviewed. No pertinent family history. History   Social History  . Marital Status: Single    Spouse Name: N/A    Number of Children: N/A  . Years of Education: N/A   Social History Main Topics  . Smoking status: Never Smoker   . Smokeless tobacco: Never Used  . Alcohol Use: No  . Drug Use: No  . Sexually Active: None   Other Topics Concern  . None   Social History Narrative  . None   Past Surgical History  Procedure Laterality Date  . Tee without cardioversion N/A 09/22/2012    Procedure: TRANSESOPHAGEAL ECHOCARDIOGRAM (TEE);  Surgeon: [name], MD;  Location: [facility] ENDOSCOPY;  Service: Cardiovascular;  Laterality: N/A;   Past Medical History  Diagnosis Date  . Stroke   . Hypertension   . Left hemiparesis    BP 99/63  Pulse 99  Resp 14  Ht 5\' 7"  (1.702 m)  Wt 148 lb (67.132 kg)  BMI 23.17 kg/m2  SpO2 99%    Review of Systems  Cardiovascular: Positive for leg swelling.  Gastrointestinal: Positive for abdominal pain and constipation.   Neurological: Positive for tremors and weakness.       Tingling       Objective:   Physical Exam  Limited range of motion in all directions left shoulder. Pain with range of motion      Assessment & Plan:   1.  left frozen shoulder not better after conservative care including medication and physical therapy. Will inject today  Shoulder injection Left   Indication:Left Shoulder pain not relieved by medication management and other conservative care.  Informed consent was obtained after describing risks and benefits of the procedure with the patient, this includes bleeding, bruising, infection and medication side effects. The patient wishes to proceed and has given written consent. Patient was placed in a seated position. The Left shoulder was marked and prepped with betadine in the subacromial area. A 25-gauge 1-1/2 inch needle was inserted into the subacromial area. After negative draw back for blood, a solution containing 1 mL of 40 mg per ML depomedrol and 3 mL of 1% lidocaine was injected. A band aid was applied. The patient tolerated the procedure well. Post procedure instructions were given.

## 2013-02-18 ENCOUNTER — Encounter: Payer: Self-pay | Admitting: Neurology

## 2013-02-18 ENCOUNTER — Encounter: Payer: BC Managed Care – PPO | Attending: Physical Medicine & Rehabilitation

## 2013-02-18 ENCOUNTER — Ambulatory Visit (INDEPENDENT_AMBULATORY_CARE_PROVIDER_SITE_OTHER): Payer: BC Managed Care – PPO | Admitting: Neurology

## 2013-02-18 ENCOUNTER — Ambulatory Visit (HOSPITAL_BASED_OUTPATIENT_CLINIC_OR_DEPARTMENT_OTHER): Payer: BC Managed Care – PPO | Admitting: Physical Medicine & Rehabilitation

## 2013-02-18 ENCOUNTER — Encounter: Payer: Self-pay | Admitting: Physical Medicine & Rehabilitation

## 2013-02-18 VITALS — BP 122/73 | HR 90

## 2013-02-18 VITALS — BP 107/70 | HR 66 | Resp 14 | Ht 67.0 in | Wt 145.0 lb

## 2013-02-18 DIAGNOSIS — I6350 Cerebral infarction due to unspecified occlusion or stenosis of unspecified cerebral artery: Secondary | ICD-10-CM

## 2013-02-18 DIAGNOSIS — G90519 Complex regional pain syndrome I of unspecified upper limb: Secondary | ICD-10-CM | POA: Insufficient documentation

## 2013-02-18 DIAGNOSIS — M7502 Adhesive capsulitis of left shoulder: Secondary | ICD-10-CM

## 2013-02-18 DIAGNOSIS — I69998 Other sequelae following unspecified cerebrovascular disease: Secondary | ICD-10-CM | POA: Insufficient documentation

## 2013-02-18 DIAGNOSIS — I10 Essential (primary) hypertension: Secondary | ICD-10-CM | POA: Insufficient documentation

## 2013-02-18 DIAGNOSIS — R209 Unspecified disturbances of skin sensation: Secondary | ICD-10-CM | POA: Insufficient documentation

## 2013-02-18 DIAGNOSIS — G8190 Hemiplegia, unspecified affecting unspecified side: Secondary | ICD-10-CM

## 2013-02-18 DIAGNOSIS — M7500 Adhesive capsulitis of unspecified shoulder: Secondary | ICD-10-CM

## 2013-02-18 DIAGNOSIS — I69959 Hemiplegia and hemiparesis following unspecified cerebrovascular disease affecting unspecified side: Secondary | ICD-10-CM | POA: Insufficient documentation

## 2013-02-18 DIAGNOSIS — I639 Cerebral infarction, unspecified: Secondary | ICD-10-CM

## 2013-02-18 MED ORDER — TRAMADOL HCL 50 MG PO TABS: 50.0000 mg | ORAL_TABLET | Freq: Four times a day (QID) | ORAL | Status: AC | PRN

## 2013-02-18 NOTE — Patient Instructions (Addendum)
Continue current medications.  Return for follow up in 3 months.  We will evaluate you for sleep apnea because it increases the risk for stroke and heart attack.  STROKE/TIA INSTRUCTIONS SMOKING Cigarette smoking nearly doubles your risk of having a stroke & is the single most alterable risk factor  If you smoke or have smoked in the last 12 months, you are advised to quit smoking for your health.  Most of the excess cardiovascular risk related to smoking disappears within a year of stopping.  Ask you doctor about anti-smoking medications  [place] Quit Line: 1-800-QUIT NOW  Free Smoking Cessation Classes ([phone]  CHOLESTEROL Know your levels; limit fat & cholesterol in your diet  Lab Results  Component Value Date   CHOL 152 09/15/2012   HDL 52 09/15/2012   LDLCALC 87 09/15/2012   TRIG 63 09/15/2012   CHOLHDL 2.9 09/15/2012      Many patients benefit from treatment even if their cholesterol is at goal.  Goal: Total Cholesterol less than 160  Goal:  LDL less than 100  Goal:  HDL greater than 40  Goal:  Triglycerides less than 150  BLOOD PRESSURE American Stroke Association blood pressure target is less that 120/80 mm/Hg  Your discharge blood pressure is:  BP: 122/73 mmHg  Monitor your blood pressure  Limit your salt and alcohol intake  Many individuals will require more than one medication for high blood pressure  DIABETES (A1c is a blood sugar average for last 3 months) Goal A1c is under 7% (A1c is blood sugar average for last 3 months)  Diabetes: No known diagnosis of diabetes    Lab Results  Component Value Date   HGBA1C 5.3 09/15/2012    Your A1c can be lowered with medications, healthy diet, and exercise.  Check your blood sugar as directed by your physician  Call your physician if you experience unexplained or low blood sugars.  PHYSICAL ACTIVITY/REHABILITATION Goal is 30 minutes at least 4 days per week    Activity decreases your risk of heart attack and stroke and  makes your heart stronger.  It helps control your weight and blood pressure; helps you relax and can improve your mood.  Participate in a regular exercise program.  Talk with your doctor about the best form of exercise for you (dancing, walking, swimming, cycling).  DIET/WEIGHT Goal is to maintain a healthy weight  Your height is:  Height:  (pt in wheel chair unable to get ht) Your current weight is: Weight:  (pt in wheel chair unable to get wht) Your body Mass Index (BMI) is:     Following the type of diet specifically designed for you will help prevent another stroke.  Your goal Body Mass Index (BMI) is 19-24.  Healthy food habits can help reduce 3 risk factors for stroke:  High cholesterol, hypertension, and excess weight.

## 2013-02-18 NOTE — Progress Notes (Signed)
Subjective:    Patient ID: Stephanie Mason, female    DOB: 12-14-1950, 61 y.o.   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 The patient with noted seizure in the emergency  department, received Ativan as well as loaded with Keppra. MRI of the  brain showed large acute partially hemorrhagic right hemispheric  infarcts, superimposed upon remote infarct. The patient did receive t-  PA. Echocardiogram with normal left ventricular function. No source of  embolus. EEG showed no seizure activity. Carotid Dopplers with no ICA  stenosis. Transcranial Dopplers with right terminal ICA stenosis  Seen by home health PT and OT. Initially seen by home health speech therapy but she was discharged because things checked out well. Home health PT and OT R. finishing up. Requesting outpatient therapy. Lives in [place] Pain Inventory Average Pain 9 Pain Right Now 9 My pain is aching  In the last 24 hours, has pain interfered with the following? General activity 0 Relation with others 0 Enjoyment of life 0 What TIME of day is your pain at its worst? night Sleep (in general) Fair  Pain is worse with: some activites Pain improves with: medication Relief from Meds: 5  Mobility walk with assistance use a walker  Function disabled: date disabled 09/15/12 I need assistance with the following:  dressing, bathing, meal prep, household duties and shopping  Neuro/Psych trouble walking  Prior Studies Any changes since last visit?  no  Physicians involved in your care Any changes since last visit?  no   History reviewed. No pertinent family history. History   Social History  . Marital Status: Single    Spouse Name: N/A    Number of Children: N/A  . Years of Education: N/A   Social History  Main Topics  . Smoking status: Never Smoker   . Smokeless tobacco: Never Used  . Alcohol Use: No  . Drug Use: No  . Sexually Active: None   Other Topics Concern  . None   Social History Narrative  . None   Past Surgical History  Procedure Laterality Date  . Tee without cardioversion N/A 09/22/2012    Procedure: TRANSESOPHAGEAL ECHOCARDIOGRAM (TEE);  Surgeon: [name], MD;  Location: [facility] ENDOSCOPY;  Service: Cardiovascular;  Laterality: N/A;   Past Medical History  Diagnosis Date  . Stroke   . Hypertension   . Left hemiparesis    BP 107/70  Pulse 66  Resp 14  Ht 5\' 7"  (1.702 m)  Wt 145 lb (65.772 kg)  BMI 22.71 kg/m2  SpO2 97%    Review of Systems  Musculoskeletal: Positive for gait problem.       Shoulder and elbow pain  All other systems reviewed and are negative.       Objective:   Physical Exam  0/5 strength left deltoid, bicep, tricep, grip Left shoulder with pain during passive abduction starting at 75, external rotation starting at 0 Left hand has contracture no hypersensitivity to touch. Has full extension but limited flexion at the MCP PIP and DIP joints  Mood and affect are appropriate      Assessment & Plan:  1. Left adhesive capsulitis with partial response to left shoulder injection. Injection has worn off. Pain is persisting despite physical therapy. Will repeat injection. Transition physical therapy and occupational therapy to outpatient setting  Shoulder injection Left  Indication:Left Shoulder pain not relieved by medication management and other conservative care.  Informed consent was obtained after describing risks and benefits of the procedure with the patient, this includes bleeding, bruising, infection and medication side effects. The patient wishes to proceed and has given written consent. Patient was placed in a seated position. The Left shoulder was marked and prepped with betadine in the subacromial area. A 25-gauge 1-1/2 inch  needle was inserted into the subacromial area. After negative draw back for blood, a solution containing 1 mL of 40 mg per ML depomedrol and 3 mL of 1% lidocaine was injected. A band aid was applied. The patient tolerated the procedure well. Post procedure instructions were given.

## 2013-02-18 NOTE — Progress Notes (Signed)
[facility]  PATIENT: Stephanie Mason DOB: February 25, 1951   HISTORY FROM: patient, chart REASON FOR VISIT: follow up  HISTORY OF PRESENT ILLNESS:  Mrs. 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 The patient with noted seizure in the emergency department, received Ativan as well as loaded with Keppra. MRI of the brain showed large acute partially hemorrhagic right hemispheric infarcts, superimposed upon remote infarct. The patient did receive t- PA.  Echocardiogram with normal left ventricular function. No source of embolus. EEG showed no seizure activity. Carotid Dopplers with no ICA stenosis. Transcranial Dopplers with right terminal ICA stenosis.  She was admitted to in-patient rehab for 4 weeks and discharged home.    After being home a short time, she was found to have DVT right common femoral, left common femoral and superficial femoral as well as left popliteal and posterior tibial. Started on Coumadin.  Patient denies medication side effects, with no signs of bleeding.    UPDATE 02/18/13:  Patient comes to office for follow up.  She was discharged on Keppra since having seizure in ED.  No additional seizures in rehab or at home.  Patient is able to walk with assistance or hemi-walker.  She has no movement of her left arm or hand.  Has occasional unintentional jerks of left hand. She has had left adhesive capsulitis with partial response to left shoulder injection x 2 by Dr. Larna Daughters.  She has some movement to gravity of left leg.  She cannot extend her left foot.  Outpatient physical therapy is planned.  Her speech has improved, no dysarthria noted and has no trouble swallowing.  She reports appetite is good and she is sleeping fairly well but is very tired during the day.   REVIEW OF SYSTEMS: Full 14  system review of systems performed and notable only for: constitutional: N/A  cardiovascular: swelling in legs (left) respiratory: N/A endocrine: feelng cold  ear/nose/throat: N/A  musculoskeletal: aching muscles skin: N/A genitourinary: N/A Gastrointestinal: N/A allergy/immunology: N/A neurological: headache, weakness sleep: Positive for daytime sleepiness, snoring, apnea per friend psychiatric: N/A Obstructive Sleep Apnea calculated score=5  ALLERGIES: No Known Allergies  HOME MEDICATIONS: Outpatient Prescriptions Prior to Visit  Medication Sig Dispense Refill  . acetaminophen (TYLENOL) 325 MG tablet Take 325 mg by mouth daily as needed for pain. For pain      . diclofenac sodium (VOLTAREN) 1 % GEL Apply 2 g topically 4 (four) times daily.  3 Tube  2  . divalproex (DEPAKOTE SPRINKLES) 125 MG capsule Take 250 mg by mouth at bedtime.      . fluticasone (FLONASE) 50 MCG/ACT nasal spray Place 1 spray into the nose daily as needed. For nasal congestion      . gabapentin (NEURONTIN) 300 MG capsule Take 300 mg by mouth 3 (three) times daily.      Marland Kitchen levETIRAcetam (KEPPRA) 500 MG tablet Take 500 mg by mouth every 12 (twelve) hours.      Marland Kitchen losartan (COZAAR) 50 MG tablet Take 50 mg by mouth daily. Take every other day.      . oxybutynin (DITROPAN-XL) 5 MG 24 hr tablet Take 5 mg by mouth daily.      . tizanidine (ZANAFLEX) 2 MG capsule Take 1 capsule (2 mg total) by mouth 3 (three) times daily.  90 capsule  1  . warfarin (COUMADIN)  7.5 MG tablet Take 1 tablet (7.5 mg total) by mouth daily.  30 tablet  0   No facility-administered medications prior to visit.    PAST MEDICAL HISTORY: Past Medical History  Diagnosis Date  . Stroke   . Hypertension   . Left hemiparesis   . High cholesterol     PAST SURGICAL HISTORY: Past Surgical History  Procedure Laterality Date  . Tee without cardioversion N/A 09/22/2012    Procedure: TRANSESOPHAGEAL ECHOCARDIOGRAM (TEE);  Surgeon: [name], MD;  Location: [facility] ENDOSCOPY;  Service: Cardiovascular;  Laterality: N/A;    FAMILY HISTORY: Family History  Problem Relation Age of Onset  . Stroke Mother   . Cancer Father     SOCIAL HISTORY: History   Social History  . Marital Status: Single    Spouse Name: N/A    Number of Children: N/A  . Years of Education: N/A   Occupational History  . Not on file.   Social History Main Topics  . Smoking status: Never Smoker   . Smokeless tobacco: Never Used  . Alcohol Use: No  . Drug Use: No  . Sexually Active: Not on file   Other Topics Concern  . Not on file   Social History Narrative  . No narrative on file     PHYSICAL EXAM  Filed Vitals:   02/18/13 1517  BP: 122/73  Pulse: 90   Cannot calculate BMI with a height equal to zero.  Generalized: In no acute distress   Neck: Supple, no carotid bruits   Cardiac: Regular rate rhythm, no murmur   Pulmonary: Clear to auscultation bilaterally   Musculoskeletal: No deformity   Neurological examination   Mentation: Alert oriented to time, place, history taking, language fluent, and causual conversation  Cranial nerve II-XII: Pupils were equal round reactive to light extraocular movements were full, visual field were full on confrontational test. facial sensation and strength were normal. hearing was intact to finger rubbing bilaterally. Uvula tongue midline. head turning and shoulder shrug and were normal and symmetric.Tongue protrusion into cheek strength was normal. MOTOR: normal bulk and tone, full strength in the RUE, RLE, 0/5 strength left deltoid, bicep, tricep, grip. 3/5 strength  Left shoulder with pain during passive abduction starting at 75, external rotation starting at 0  Left hand has contracture no hypersensitivity to touch. Has full extension but limited flexion at the MCP PIP and DIP joints SENSORY: normal and symmetric to light touch, pinprick, temperature, vibration COORDINATION:  finger-nose-finger and heel-to-shin normal on right, unable on left REFLEXES: Brachioradialis 3/2, biceps 3/2, triceps 3/2, patellar 3/2, Achilles 2/2 GAIT/STATION: not tested, in wheelchair  DIAGNOSTIC DATA (LABS, IMAGING, TESTING) - I reviewed patient records, labs, notes, testing and imaging myself where available.  Lab Results  Component Value Date   WBC 7.2 12/13/2012   HGB 12.6 12/13/2012   HCT 38.7 12/13/2012   MCV 99.2 12/13/2012   PLT 169 12/13/2012      Component Value Date/Time   NA 141 12/13/2012 1926   K 4.0 12/13/2012 1926   CL 103 12/13/2012 1926   CO2 30 12/13/2012 1926   GLUCOSE 145* 12/13/2012 1926   BUN 11 12/13/2012 1926   CREATININE 0.66 12/13/2012 1926   CALCIUM 9.5 12/13/2012 1926   PROT 6.6 12/13/2012 1926   ALBUMIN 3.2* 12/13/2012 1926   AST 11 12/13/2012 1926   ALT 9 12/13/2012 1926   ALKPHOS 121* 12/13/2012 1926   BILITOT 0.2* 12/13/2012 1926   GFRNONAA >90  12/13/2012 1926   GFRAA >90 12/13/2012 1926   Lab Results  Component Value Date   CHOL 152 09/15/2012   HDL 52 09/15/2012   LDLCALC 87 09/15/2012   TRIG 63 09/15/2012   CHOLHDL 2.9 09/15/2012   Lab Results  Component Value Date   HGBA1C 5.3 09/15/2012    SIGNIFICANT DIAGNOSTIC STUDIES  CT of the brain  09/19/2012 Stable slightly decreased mass effect associated with the right ACA and MCA infarcts. Leftward midline shift now 4 mm. Stable petechial hemorrhage. No new intracranial abnormality.  09/17/2012 Large area progressing infarct in the right frontal and temporal lobes with a vague central hemorrhagic change better depicted on MRI. There is mass effect with increasing right to left midline shift now approximately 7 mm.  09/15/2012 Large right hemispheric acute/subacute infarct with mass effect and possible minimal petechial hemorrhage. This finding appears to be superimposed upon a chronic right hemispheric infarct as indicated by prominence of the right sylvian fissure. Compression of the frontal horn of the right lateral ventricle and 1.9 mm  of midline shift to the left.  MRI of the brain 09/16/2012 Large acute partially hemorrhagic right hemispheric infarct is superimposed upon remote infarct as detailed above. This causes mass effect upon the right lateral ventricle with midline shift to the left by 7.4 mm. Cerebellar atrophy. Paranasal sinus mucosal thickening most notable maxillary sinuses and greater on the left  MRA of the brain 09/16/2012 Motion degraded examination limits evaluation as detailed above. What can be stated is that there is a decreased number of visualized right middle cerebral artery branch vessels consistent with the patient's acute and chronic infarct.  2D Echocardiogram Normal LV function, no source of embolus.  Carotid Doppler No evidence of hemodynamically significant internal carotid artery stenosis. Vertebral artery flow is antegrade.  TCD This was a normal transcranial Doppler study, with normal flow direction and velocity of all identified vessels of the anterior and posterior circulations, with no evidence of stenosis, vasospasm or occlusion. There was no evidence of intracranial disease.  TEE no source of embolus, no PFO  CXR 09/15/2012 No active lung disease.  Rounded opacity at the left lung base may represent focal eventration, but a mass cannot be excluded.  CT Chest 09/16/2012 A fat containing Bochdalek hernia accounts for the questioned abnormality at the base of the left hemithorax on recent chest radiograph. No acute findings.  EEG this is a normal awake EEG    ASSESSMENT AND PLAN  Mrs. 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 Seizure upon arrival to ER with recent stroke, maintenance on Keppra.  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   Bilateral DVT, treatment by  primary care physician with Coumadin. NIH Stroke Scale 7, Modified Rankin Score 3  1. No source of embolus found, may consider hypercoagulable and vasculitic labs after treatment for DVT with Coumadin completed. 2. Maintain strict control of hypertension with blood pressure goal below 130/90, diabetes with hemoglobin A1c goal below 6.5% and lipids with LDL cholesterol goal below 100 mg/dL.  3. Continue Keppra, re-evaluate at next visit. 4. Referral for sleep study to [facility] for evaluation of sleep apnea due to subjective report of snoring, apneic episodes and daytime sleepiness.  Obstructive Sleep Apnea calculated score=5 5. Followup in 3 months.   Bellarose Burtt  Markham Dumlao NP-C 02/18/2013, 5:06 PM  [facility] [address] [place], [place] [address] [phone]  I have personally examined this patient, reviewed pertinent data, developed plan of care and discussed with patient and agree with above.  Delia Heady, MD

## 2013-02-18 NOTE — Patient Instructions (Addendum)
Orders for outpatient therapy at [facility] guided left shoulder injection next visit if shoulder still hurts

## 2013-02-25 ENCOUNTER — Other Ambulatory Visit: Payer: Self-pay | Admitting: Neurology

## 2013-02-25 ENCOUNTER — Telehealth: Payer: Self-pay | Admitting: Neurology

## 2013-02-25 DIAGNOSIS — I639 Cerebral infarction, unspecified: Secondary | ICD-10-CM

## 2013-02-25 NOTE — Telephone Encounter (Signed)
This patient has been referred by: Heide Guile (Dr. Pearlean Brownie) For an attended sleep study  SLEEP SYMPTOMS:   snoring, apneic episodes and daytime sleepiness. Obstructive Sleep Apnea calculated score=5   PAST MEDICAL HISTORY:  Recent stroke Feb 2014 (CVA 22 years ago without residual side effects); hemiplegia; hypertension; hypercholesterolemia.   MEDICATIONS: acetaminophen (TYLENOL) 325 MG tablet  Take 325 mg by mouth daily as needed for pain. For pain    diclofenac sodium (VOLTAREN) 1 % GEL  Apply 2 g topically 4 (four) times daily.  3 Tube  2    divalproex (DEPAKOTE SPRINKLES) 125 MG capsule  Take 250 mg by mouth at bedtime.    fluticasone (FLONASE) 50 MCG/ACT nasal spray  Place 1 spray into the nose daily as needed. For nasal congestion    gabapentin (NEURONTIN) 300 MG capsule  Take 300 mg by mouth 3 (three) times daily.    levETIRAcetam (KEPPRA) 500 MG tablet  Take 500 mg by mouth every 12 (twelve) hours.    losartan (COZAAR) 50 MG tablet  Take 50 mg by mouth daily. Take every other day.    oxybutynin (DITROPAN-XL) 5 MG 24 hr tablet  Take 5 mg by mouth daily.    tizanidine (ZANAFLEX) 2 MG capsule  Take 1 capsule (2 mg total) by mouth 3 (three) times daily.  90 capsule  1    warfarin (COUMADIN) 7.5 MG tablet  Take 1 tablet (7.5 mg total) by mouth daily.  30 tablet    INSURANCE:  [employer]  OTHER NOTES:

## 2013-03-09 ENCOUNTER — Other Ambulatory Visit: Payer: Self-pay | Admitting: Neurology

## 2013-03-09 DIAGNOSIS — I639 Cerebral infarction, unspecified: Secondary | ICD-10-CM

## 2013-03-17 ENCOUNTER — Encounter: Payer: Self-pay | Admitting: Physical Medicine & Rehabilitation

## 2013-03-30 ENCOUNTER — Encounter: Payer: BC Managed Care – PPO | Attending: Physical Medicine & Rehabilitation

## 2013-03-30 ENCOUNTER — Ambulatory Visit: Payer: BC Managed Care – PPO | Admitting: Physical Medicine & Rehabilitation

## 2013-04-12 ENCOUNTER — Encounter: Payer: Self-pay | Admitting: Physical Medicine & Rehabilitation

## 2013-04-16 ENCOUNTER — Ambulatory Visit (INDEPENDENT_AMBULATORY_CARE_PROVIDER_SITE_OTHER): Payer: BC Managed Care – PPO | Admitting: Neurology

## 2013-04-16 DIAGNOSIS — G4700 Insomnia, unspecified: Secondary | ICD-10-CM

## 2013-04-16 DIAGNOSIS — G4733 Obstructive sleep apnea (adult) (pediatric): Secondary | ICD-10-CM

## 2013-04-16 DIAGNOSIS — I639 Cerebral infarction, unspecified: Secondary | ICD-10-CM

## 2013-05-05 ENCOUNTER — Other Ambulatory Visit: Payer: Self-pay | Admitting: Neurology

## 2013-05-05 ENCOUNTER — Telehealth: Payer: Self-pay | Admitting: Neurology

## 2013-05-05 DIAGNOSIS — G4733 Obstructive sleep apnea (adult) (pediatric): Secondary | ICD-10-CM

## 2013-05-05 NOTE — Telephone Encounter (Signed)
I called and spoke with the patient about her recent sleep study results. I informed the patient that her results confirmed the diagnosis of obstructive sleep apnea and insomnia with sleep apnea and that Dr. Vickey Huger wants to the patient to have CPAP titration study. Patient has agreed on the date of 06-04-13@8pm 

## 2013-05-06 ENCOUNTER — Encounter: Payer: Self-pay | Admitting: *Deleted

## 2013-05-10 ENCOUNTER — Other Ambulatory Visit: Payer: Self-pay | Admitting: Neurology

## 2013-05-10 DIAGNOSIS — G4730 Sleep apnea, unspecified: Secondary | ICD-10-CM

## 2013-05-10 DIAGNOSIS — I639 Cerebral infarction, unspecified: Secondary | ICD-10-CM

## 2013-05-12 ENCOUNTER — Encounter: Payer: Self-pay | Admitting: Physical Medicine & Rehabilitation

## 2013-06-04 ENCOUNTER — Ambulatory Visit (INDEPENDENT_AMBULATORY_CARE_PROVIDER_SITE_OTHER): Payer: BC Managed Care – PPO

## 2013-06-04 DIAGNOSIS — G4733 Obstructive sleep apnea (adult) (pediatric): Secondary | ICD-10-CM

## 2013-06-12 ENCOUNTER — Encounter: Payer: Self-pay | Admitting: Physical Medicine & Rehabilitation

## 2013-06-17 ENCOUNTER — Telehealth: Payer: Self-pay | Admitting: Neurology

## 2013-06-17 NOTE — Telephone Encounter (Signed)
I called and left a message for the patient to callback to the office and speak with me Jasmine December) about her recent sleep study results.

## 2013-06-21 ENCOUNTER — Telehealth: Payer: Self-pay | Admitting: Neurology

## 2013-06-21 NOTE — Telephone Encounter (Signed)
I called and spoke with the patient about her recent sleep study results patient understood and  [facility] is her choice to suppy machine. I informed the patient that I will mail a copy to her and fax a copy to [employer].

## 2013-06-22 ENCOUNTER — Encounter: Payer: Self-pay | Admitting: *Deleted

## 2013-06-22 ENCOUNTER — Other Ambulatory Visit: Payer: Self-pay | Admitting: *Deleted

## 2013-06-22 DIAGNOSIS — G4733 Obstructive sleep apnea (adult) (pediatric): Secondary | ICD-10-CM

## 2013-07-12 ENCOUNTER — Encounter: Payer: Self-pay | Admitting: Physical Medicine & Rehabilitation

## 2013-08-09 ENCOUNTER — Encounter: Payer: Self-pay | Admitting: Neurology

## 2013-08-12 ENCOUNTER — Encounter: Payer: Self-pay | Admitting: Physical Medicine & Rehabilitation

## 2013-08-18 ENCOUNTER — Encounter: Payer: Self-pay | Admitting: Neurology

## 2013-09-03 ENCOUNTER — Encounter: Payer: Self-pay | Admitting: Neurology

## 2013-09-12 ENCOUNTER — Encounter: Payer: Self-pay | Admitting: Physical Medicine & Rehabilitation

## 2013-10-01 ENCOUNTER — Encounter (INDEPENDENT_AMBULATORY_CARE_PROVIDER_SITE_OTHER): Payer: Self-pay

## 2013-10-01 ENCOUNTER — Ambulatory Visit (INDEPENDENT_AMBULATORY_CARE_PROVIDER_SITE_OTHER): Payer: BC Managed Care – PPO | Admitting: Neurology

## 2013-10-01 ENCOUNTER — Encounter: Payer: Self-pay | Admitting: Neurology

## 2013-10-01 VITALS — BP 115/75 | HR 82

## 2013-10-01 DIAGNOSIS — I639 Cerebral infarction, unspecified: Secondary | ICD-10-CM

## 2013-10-01 DIAGNOSIS — Z9989 Dependence on other enabling machines and devices: Principal | ICD-10-CM

## 2013-10-01 DIAGNOSIS — I6350 Cerebral infarction due to unspecified occlusion or stenosis of unspecified cerebral artery: Secondary | ICD-10-CM

## 2013-10-01 DIAGNOSIS — G4733 Obstructive sleep apnea (adult) (pediatric): Secondary | ICD-10-CM

## 2013-10-01 DIAGNOSIS — R569 Unspecified convulsions: Secondary | ICD-10-CM

## 2013-10-01 NOTE — Patient Instructions (Signed)
Sleep Apnea  Sleep apnea is disorder that affects a person's sleep. A person with sleep apnea has abnormal pauses in their breathing when they sleep. It is hard for them to get a good sleep. This makes a person tired during the day. It also can lead to other physical problems. There are three types of sleep apnea. One type is when breathing stops for a short time because your airway is blocked (obstructive sleep apnea). Another type is when the brain sometimes fails to give the normal signal to breathe to the muscles that control your breathing (central sleep apnea). The third type is a combination of the other two types.  HOME CARE  · Do not sleep on your back. Try to sleep on your side.  · Take all medicine as told by your doctor.  · Avoid alcohol, calming medicines (sedatives), and depressant drugs.  · Try to lose weight if you are overweight. Talk to your doctor about a healthy weight goal.  Your doctor may have you use a device that helps to open your airway. It can help you get the air that you need. It is called a positive airway pressure (PAP) device. There are three types of PAP devices:  · Continuous positive airway pressure (CPAP) device.  · Nasal expiratory positive airway pressure (EPAP) device.  · Bilevel positive airway pressure (BPAP) device.  MAKE SURE YOU:  · Understand these instructions.  · Will watch your condition.  · Will get help right away if you are not doing well or get worse.  Document Released: 05/07/2008 Document Revised: 07/15/2012 Document Reviewed: 11/30/2011  [employer]® Patient Information ©2014 [employer].

## 2013-10-01 NOTE — Progress Notes (Signed)
[facility]  PATIENT: Stephanie Mason DOB: June 06, 1951   HISTORY FROM: patient, chart REASON FOR VISIT: Dr Pearlean Brownie NP [facility]  ordered a PSG :   HISTORY OF PRESENT ILLNESS:  For a sleep study based on a obstructive sleep apnea risk assessment performed by Enid Skeens, N. practitioner. The study took place on 04-16-13 and revealed an AHI of 17.1 and an RDI of 32.8. Her REM sleep AHI was 56 the patient slept all night in the supine position. She did not have significant oxygen desaturations and her heart rate remained stable in normal sinus rhythm. Based on the results she was invited to return for a CPAP titration, which took place on 06-04-13.  The patient started on 10 cm water CPAP with 2 cm EPR using an ESON nasal  mask that covers her nose only. The AHI at that setting was 3.5 per hour.  The patient reports having a habit of going to bed rather early, as her husband has to rise early in the morning. The troubles to bed around 7 PM and her husband feel surgical to sleep very promptly but the patient seems to disagree. She struggles and sometimes has to watch TV or distract herself , She reports that it is not as difficult for her to fall asleep at this to maintain sleep, thus  she has begun taking some Tylenol PM which has helped.  The patient reports sleeping habitually on her back since her stroke has made it harder for her to find a different sleep position and turn by herself. The patient rises at 4 AM asleep with old using an alarm. She has not returned to bed after her husband left for work. She will have a breakfast times with coffee sometimes without. She usually does not drink coffee negative beverages during the day. Maybe twice a week and accompanied. She mostly drinks water and has a well-balanced diet. She does nap in the afternoon, being her sleepiest midmorning. She falls asleep inappropriately in a chair but doesn't plan to take a nap. Is on average 20 minutes long and  does refresh her.   She reports that the nasal mask she has used has caused a bruise on her left cheek. And she would like an alternative interface to use until this has healed. She may need to be refitted for a mask. Her in office download today shows that the machine is set at 10 cm water pressure with 3 cm water EPR, apnea age I indexes not we'll 0.6 and her daytime and therapy is 4 hours and 16 minutes. Over the last 66 days she has used the machine on 41 days only. She states that she had a head cold which may be nasal air flow very uncomfortable.  Durable medical equipment [facility].   The patient endorsed today the fatigue severity score at 13 points , the Epworth sleepiness score at 9 points,  and the geriatric depression score at 1 point.             Last visit with Dr. 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 She was admitted on September 15, 2012 to [facility] with left- sided weakness. The patient with noted seizure in the emergency department, received Ativan as well as loaded with Keppra. MRI of the brain showed large acute partially hemorrhagic right hemispheric infarcts, superimposed upon  remote infarct. The patient did receive t- PA.  Echocardiogram with normal left ventricular function. No source of embolus. EEG showed no seizure activity. Carotid Dopplers with no ICA stenosis. Transcranial Dopplers with right terminal ICA stenosis.  She was admitted to in-patient rehab for 4 weeks and discharged home.    After being home a short time, she was found to have DVT right common femoral, left common femoral and superficial femoral as well as left popliteal and posterior tibial. Started on Coumadin.  Patient denies medication side effects, with no signs of bleeding.    UPDATE 02/18/13:  Patient comes to office for follow up.  She was  discharged on Keppra since having seizure in ED.  No additional seizures in rehab or at home.  Patient is able to walk with assistance or hemi-walker.  She has no movement of her left arm or hand.  Has occasional unintentional jerks of left hand. She has had left adhesive capsulitis with partial response to left shoulder injection x 2 by Dr. Larna Daughters.  She has some movement to gravity of left leg.  She cannot extend her left foot.  Outpatient physical therapy is planned.  Her speech has improved, no dysarthria noted and has no trouble swallowing.  She reports appetite is good and she is sleeping fairly well but is very tired during the day.   REVIEW OF SYSTEMS: Full 14 system review of systems performed and notable only for: constitutional: N/A  cardiovascular: swelling in legs (left) respiratory: N/A endocrine: feelng cold  ear/nose/throat: N/A  musculoskeletal: aching muscles skin: N/A genitourinary: N/A Gastrointestinal: N/A allergy/immunology: N/A neurological: headache, weakness sleep: Positive for daytime sleepiness, snoring, apnea per friend psychiatric: N/A Obstructive Sleep Apnea calculated score=5  ALLERGIES: No Known Allergies  HOME MEDICATIONS: Outpatient Prescriptions Prior to Visit  Medication Sig Dispense Refill  . acetaminophen (TYLENOL) 325 MG tablet Take 325 mg by mouth daily as needed for pain. For pain      . diclofenac sodium (VOLTAREN) 1 % GEL Apply 2 g topically 4 (four) times daily.  3 Tube  2  . divalproex (DEPAKOTE SPRINKLES) 125 MG capsule Take 250 mg by mouth at bedtime.      . fluticasone (FLONASE) 50 MCG/ACT nasal spray Place 1 spray into the nose daily as needed. For nasal congestion      . gabapentin (NEURONTIN) 300 MG capsule Take 300 mg by mouth 3 (three) times daily.      Marland Kitchen levETIRAcetam (KEPPRA) 500 MG tablet Take 500 mg by mouth every 12 (twelve) hours.      Marland Kitchen losartan (COZAAR) 50 MG tablet Take 50 mg by mouth daily. Take every other day.      .  oxybutynin (DITROPAN-XL) 5 MG 24 hr tablet Take 5 mg by mouth daily.      . tizanidine (ZANAFLEX) 2 MG capsule Take 1 capsule (2 mg total) by mouth 3 (three) times daily.  90 capsule  1  . traMADol (ULTRAM) 50 MG tablet Take 1 tablet (50 mg total) by mouth every 6 (six) hours as needed for pain.  30 tablet  2  . warfarin (COUMADIN) 7.5 MG tablet Take 7.5 mg by mouth daily. M,W,F Sunday take a whole tab  T,Thursday,saturday take a half of tab       No facility-administered medications prior to visit.    PAST MEDICAL HISTORY: Past Medical History  Diagnosis Date  . Stroke   . Hypertension   . Left hemiparesis   . High cholesterol   .  Seizures   . OSA on CPAP     PAST SURGICAL HISTORY: Past Surgical History  Procedure Laterality Date  . Tee without cardioversion N/A 09/22/2012    Procedure: TRANSESOPHAGEAL ECHOCARDIOGRAM (TEE);  Surgeon: [name], MD;  Location: [facility] ENDOSCOPY;  Service: Cardiovascular;  Laterality: N/A;    FAMILY HISTORY: Family History  Problem Relation Age of Onset  . Stroke Mother   . Cancer Father     esophageal  . Coronary artery disease    . Heart failure Brother     SOCIAL HISTORY: History   Social History  . Marital Status: Single    Spouse Name: N/A    Number of Children: 2  . Years of Education: 12   Occupational History  . CNA    Social History Main Topics  . Smoking status: Never Smoker   . Smokeless tobacco: Never Used  . Alcohol Use: No  . Drug Use: No  . Sexual Activity: Not on file   Other Topics Concern  . Not on file   Social History Narrative   Patient is single and lives with her significant other [place]).   Patient has two children.   Patient is disabled.   Patient has a high school education.   Patient is right-handed.   Patient drinks one cup of coffee most days, and tea occasionally.     PHYSICAL EXAM  Filed Vitals:   10/01/13 1132  BP: 115/75  Pulse: 82   Cannot calculate BMI with a  height equal to zero.  Generalized: In no acute distress   Neck: Supple, no carotid bruits . Mallompati 3 , uvula not visible. No TMJ  Pain, no clicking,  delayed phagia ,  Worse with liquids.  Patient with partial dentures, still fitting after CVA.   Cardiac: Regular rate rhythm, no murmur   Pulmonary: Clear to auscultation bilaterally   Musculoskeletal: No deformity   Neurological examination   Mentation: Alert oriented to time, place, history taking, language fluent, and causual conversation  Cranial nerve II-XII: Pupils were equal round reactive to light extraocular movements were full, visual field were full on confrontational test. facial sensation and strength were normal. hearing was intact to finger rubbing bilaterally. Tongue midline. head turning and shoulder shrug and were normal and symmetric.Tongue protrusion into cheek strength was normal.  MOTOR: normal bulk and tone, full strength in the RUE, RLE, 0/5 strength left deltoid, bicep, tricep, grip. 3/5 strength  Left shoulder with pain during passive abduction starting at 75, external rotation starting at 0  Left hand has contracture no hypersensitivity to touch. Has full extension but limited flexion at the MCP, PIP and DIP joints  DIAGNOSTIC DATA (LABS, IMAGING, TESTING) - I reviewed patient records, labs, notes, PSG and CPAP  testing and imaging myself where available.  Lab Results  Component Value Date   WBC 7.2 12/13/2012   HGB 12.6 12/13/2012   HCT 38.7 12/13/2012   MCV 99.2 12/13/2012   PLT 169 12/13/2012      Component Value Date/Time   NA 141 12/13/2012 1926   K 4.0 12/13/2012 1926   CL 103 12/13/2012 1926   CO2 30 12/13/2012 1926   GLUCOSE 145* 12/13/2012 1926   BUN 11 12/13/2012 1926   CREATININE 0.66 12/13/2012 1926   CALCIUM 9.5 12/13/2012 1926   PROT 6.6 12/13/2012 1926   ALBUMIN 3.2* 12/13/2012 1926   AST 11 12/13/2012 1926   ALT 9 12/13/2012 1926   ALKPHOS 121* 12/13/2012  1926   BILITOT 0.2* 12/13/2012 1926   GFRNONAA >90  12/13/2012 1926   GFRAA >90 12/13/2012 1926   Lab Results  Component Value Date   CHOL 152 09/15/2012   HDL 52 09/15/2012   LDLCALC 87 09/15/2012   TRIG 63 09/15/2012   CHOLHDL 2.9 09/15/2012   Lab Results  Component Value Date   HGBA1C 5.3 09/15/2012    SIGNIFICANT DIAGNOSTIC STUDIES  CT of the brain  09/19/2012 Stable slightly decreased mass effect associated with the right ACA and MCA infarcts. Leftward midline shift now 4 mm. Stable petechial hemorrhage. No new intracranial abnormality.  09/17/2012 Large area progressing infarct in the right frontal and temporal lobes with a vague central hemorrhagic change better depicted on MRI. There is mass effect with increasing right to left midline shift now approximately 7 mm.  09/15/2012 Large right hemispheric acute/subacute infarct with mass effect and possible minimal petechial hemorrhage. This finding appears to be superimposed upon a chronic right hemispheric infarct as indicated by prominence of the right sylvian fissure. Compression of the frontal horn of the right lateral ventricle and 1.9 mm of midline shift to the left.  MRI of the brain 09/16/2012 Large acute partially hemorrhagic right hemispheric infarct is superimposed upon remote infarct as detailed above. This causes mass effect upon the right lateral ventricle with midline shift to the left by 7.4 mm. Cerebellar atrophy. Paranasal sinus mucosal thickening most notable maxillary sinuses and greater on the left  MRA of the brain 09/16/2012 Motion degraded examination limits evaluation as detailed above. What can be stated is that there is a decreased number of visualized right middle cerebral artery branch vessels consistent with the patient's acute and chronic infarct.  2D Echocardiogram Normal LV function, no source of embolus.  Carotid Doppler No evidence of hemodynamically significant internal carotid artery stenosis. Vertebral artery flow is antegrade.  TCD This was a normal transcranial Doppler study,  with normal flow direction and velocity of all identified vessels of the anterior and posterior circulations, with no evidence of stenosis, vasospasm or occlusion. There was no evidence of intracranial disease.  TEE no source of embolus, no PFO  CXR 09/15/2012 No active lung disease.  Rounded opacity at the left lung base may represent focal eventration, but a mass cannot be excluded.  CT Chest 09/16/2012 A fat containing Bochdalek hernia accounts for the questioned abnormality at the base of the left hemithorax on recent chest radiograph. No acute findings.  EEG this is a normal awake EEG .  PSG as above.    ASSESSMENT AND PLAN  Stephanie Mason is a 63 year old right-handed AA female with history of hypertension, CVA 22 years ago with little residual, and large right MCA distribution infarct on 09/15/12 with resultant left spastic hemiplegia, minimal motor recovery. Seizure upon arrival to ER with recent stroke, maintenance on Keppra.  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   Bilateral DVT, treatment by primary care physician with Coumadin. NIH Stroke Scale 7, Modified Rankin Score 3.  Stephanie Mason tested positive for OSA type moderate to severe sleep apnea and she is bound to sleep in the supine position which aggravates her apnea index.  In addition, she had a high RDI indicating a loss of muscle total for the upper respiratory system. She is compliant with her current CPAP setting of 10 cm water and 3 cm EPR. The patient had some trouble with the mask not fitting well, and I think it is related  to her dentures which she doesn't wear at night. I would like to order an alternative mask model, t o alternate with the currently use ESON model and to avoid bruising , she has currently  A bruise on her left cheek. She has left-sided facial weakness.  The patient needs to reinstate daily CPAP he was now in at least 4 hours at night. Her residual AHI is much  lower than prior to CPAP treatment and bending she feels some benefit from using CPAP at night. I will follow in 12 months - With CPAP compliance visit.  [facility] .    5. Followup in 3 months with Dr Pearlean BrownieSethi. Marland Kitchen.  Sanii Kukla, MD , 12:21 PM  [facility] [address], [address] [place], [place] [address] [phone]  I have personally examined this patient, reviewed pertinent data, developed plan of care and discussed with patient and agree with above.  Delia HeadyPramod Sethi, MD

## 2013-10-04 ENCOUNTER — Encounter: Payer: Self-pay | Admitting: Neurology

## 2013-10-10 ENCOUNTER — Encounter: Payer: Self-pay | Admitting: Physical Medicine & Rehabilitation

## 2015-07-27 ENCOUNTER — Other Ambulatory Visit: Payer: Self-pay | Admitting: Geriatric Medicine

## 2015-07-27 DIAGNOSIS — Z1231 Encounter for screening mammogram for malignant neoplasm of breast: Secondary | ICD-10-CM

## 2015-07-27 DIAGNOSIS — Z0000 Encounter for general adult medical examination without abnormal findings: Secondary | ICD-10-CM

## 2015-07-27 DIAGNOSIS — R748 Abnormal levels of other serum enzymes: Secondary | ICD-10-CM

## 2015-08-25 ENCOUNTER — Ambulatory Visit
Admission: RE | Admit: 2015-08-25 | Discharge: 2015-08-25 | Disposition: A | Discharge status: Home / Self Care | Payer: Medicare (Managed Care) | Source: Ambulatory Visit | Attending: Geriatric Medicine | Admitting: Geriatric Medicine

## 2015-09-11 ENCOUNTER — Ambulatory Visit
Admission: RE | Admit: 2015-09-11 | Discharge: 2015-09-11 | Disposition: A | Discharge status: Home / Self Care | Payer: Medicare (Managed Care) | Source: Ambulatory Visit | Attending: Geriatric Medicine | Admitting: Geriatric Medicine

## 2015-09-11 ENCOUNTER — Other Ambulatory Visit: Payer: Self-pay | Admitting: Geriatric Medicine

## 2015-09-11 DIAGNOSIS — Z0000 Encounter for general adult medical examination without abnormal findings: Secondary | ICD-10-CM

## 2015-09-11 DIAGNOSIS — Z1231 Encounter for screening mammogram for malignant neoplasm of breast: Secondary | ICD-10-CM | POA: Diagnosis present

## 2015-09-13 ENCOUNTER — Other Ambulatory Visit: Payer: Self-pay | Admitting: Geriatric Medicine

## 2015-09-13 DIAGNOSIS — R928 Other abnormal and inconclusive findings on diagnostic imaging of breast: Secondary | ICD-10-CM

## 2015-09-25 ENCOUNTER — Ambulatory Visit
Admission: RE | Admit: 2015-09-25 | Discharge: 2015-09-25 | Disposition: A | Discharge status: Home / Self Care | Payer: Medicare (Managed Care) | Source: Ambulatory Visit | Attending: Geriatric Medicine | Admitting: Geriatric Medicine

## 2015-09-25 DIAGNOSIS — R748 Abnormal levels of other serum enzymes: Secondary | ICD-10-CM

## 2015-10-02 ENCOUNTER — Ambulatory Visit
Admission: RE | Admit: 2015-10-02 | Discharge: 2015-10-02 | Disposition: A | Discharge status: Home / Self Care | Payer: Medicare (Managed Care) | Source: Ambulatory Visit | Attending: Geriatric Medicine | Admitting: Geriatric Medicine

## 2015-10-02 DIAGNOSIS — R928 Other abnormal and inconclusive findings on diagnostic imaging of breast: Secondary | ICD-10-CM | POA: Insufficient documentation

## 2015-11-10 ENCOUNTER — Other Ambulatory Visit: Payer: Self-pay | Admitting: Geriatric Medicine

## 2015-11-10 DIAGNOSIS — R002 Palpitations: Secondary | ICD-10-CM

## 2015-11-10 DIAGNOSIS — R079 Chest pain, unspecified: Secondary | ICD-10-CM

## 2015-11-16 ENCOUNTER — Ambulatory Visit
Admission: RE | Admit: 2015-11-16 | Discharge: 2015-11-16 | Disposition: A | Discharge status: Home / Self Care | Payer: Medicare (Managed Care) | Source: Ambulatory Visit | Attending: Geriatric Medicine | Admitting: Geriatric Medicine

## 2015-11-16 DIAGNOSIS — R002 Palpitations: Secondary | ICD-10-CM | POA: Diagnosis present

## 2015-11-16 DIAGNOSIS — R079 Chest pain, unspecified: Secondary | ICD-10-CM

## 2015-11-16 DIAGNOSIS — I10 Essential (primary) hypertension: Secondary | ICD-10-CM | POA: Diagnosis not present

## 2015-11-16 DIAGNOSIS — G4733 Obstructive sleep apnea (adult) (pediatric): Secondary | ICD-10-CM | POA: Insufficient documentation

## 2015-11-16 DIAGNOSIS — I69854 Hemiplegia and hemiparesis following other cerebrovascular disease affecting left non-dominant side: Secondary | ICD-10-CM | POA: Insufficient documentation

## 2015-11-16 NOTE — Progress Notes (Signed)
*  PRELIMINARY RESULTS* Echocardiogram 2D Echocardiogram has been performed.  Georgann HousekeeperJerry R Hege 11/16/2015, 11:18 AM

## 2015-11-27 ENCOUNTER — Ambulatory Visit: Payer: Medicare (Managed Care) | Admitting: Internal Medicine

## 2015-12-04 ENCOUNTER — Inpatient Hospital Stay: Payer: Medicare Other

## 2015-12-04 ENCOUNTER — Inpatient Hospital Stay: Payer: Medicare Other | Attending: Internal Medicine | Admitting: Internal Medicine

## 2015-12-04 ENCOUNTER — Telehealth: Payer: Self-pay | Admitting: *Deleted

## 2015-12-04 ENCOUNTER — Encounter: Payer: Self-pay | Admitting: Internal Medicine

## 2015-12-04 VITALS — BP 124/77 | HR 93 | Temp 97.4°F | Resp 18 | Wt 172.0 lb

## 2015-12-04 DIAGNOSIS — E7800 Pure hypercholesterolemia, unspecified: Secondary | ICD-10-CM | POA: Diagnosis not present

## 2015-12-04 DIAGNOSIS — I69354 Hemiplegia and hemiparesis following cerebral infarction affecting left non-dominant side: Secondary | ICD-10-CM | POA: Insufficient documentation

## 2015-12-04 DIAGNOSIS — R600 Localized edema: Secondary | ICD-10-CM | POA: Insufficient documentation

## 2015-12-04 DIAGNOSIS — R7989 Other specified abnormal findings of blood chemistry: Secondary | ICD-10-CM | POA: Insufficient documentation

## 2015-12-04 DIAGNOSIS — I10 Essential (primary) hypertension: Secondary | ICD-10-CM | POA: Diagnosis not present

## 2015-12-04 DIAGNOSIS — D7589 Other specified diseases of blood and blood-forming organs: Secondary | ICD-10-CM

## 2015-12-04 DIAGNOSIS — Z79899 Other long term (current) drug therapy: Secondary | ICD-10-CM | POA: Insufficient documentation

## 2015-12-04 DIAGNOSIS — R531 Weakness: Secondary | ICD-10-CM | POA: Diagnosis not present

## 2015-12-04 DIAGNOSIS — Z87891 Personal history of nicotine dependence: Secondary | ICD-10-CM | POA: Insufficient documentation

## 2015-12-04 LAB — CBC WITH DIFFERENTIAL/PLATELET
BASOS ABS: 0 10*3/uL (ref 0–0.1)
Basophils Relative: 1 %
Eosinophils Absolute: 0.1 10*3/uL (ref 0–0.7)
Eosinophils Relative: 2 %
HEMATOCRIT: 42.5 % (ref 35.0–47.0)
HEMOGLOBIN: 14.3 g/dL (ref 12.0–16.0)
LYMPHS PCT: 28 %
Lymphs Abs: 0.9 10*3/uL — ABNORMAL LOW (ref 1.0–3.6)
MCH: 36.7 pg — ABNORMAL HIGH (ref 26.0–34.0)
MCHC: 33.6 g/dL (ref 32.0–36.0)
MCV: 109.3 fL — AB (ref 80.0–100.0)
MONO ABS: 0.3 10*3/uL (ref 0.2–0.9)
Monocytes Relative: 11 %
NEUTROS ABS: 1.8 10*3/uL (ref 1.4–6.5)
NEUTROS PCT: 58 %
Platelets: 145 10*3/uL — ABNORMAL LOW (ref 150–440)
RBC: 3.89 MIL/uL (ref 3.80–5.20)
RDW: 13.7 % (ref 11.5–14.5)
WBC: 3.1 10*3/uL — ABNORMAL LOW (ref 3.6–11.0)

## 2015-12-04 LAB — COMPREHENSIVE METABOLIC PANEL
ALK PHOS: 328 U/L — AB (ref 38–126)
ALT: 295 U/L — AB (ref 14–54)
AST: 278 U/L — AB (ref 15–41)
Albumin: 3.6 g/dL (ref 3.5–5.0)
Anion gap: 4 — ABNORMAL LOW (ref 5–15)
BILIRUBIN TOTAL: 1 mg/dL (ref 0.3–1.2)
BUN: 7 mg/dL (ref 6–20)
CALCIUM: 8.8 mg/dL — AB (ref 8.9–10.3)
CO2: 28 mmol/L (ref 22–32)
CREATININE: 0.76 mg/dL (ref 0.44–1.00)
Chloride: 104 mmol/L (ref 101–111)
GFR calc Af Amer: >60 mL/min (ref >60)
GLUCOSE: 310 mg/dL — AB (ref 65–99)
Potassium: 4 mmol/L (ref 3.5–5.1)
Sodium: 136 mmol/L (ref 135–145)
TOTAL PROTEIN: 8.1 g/dL (ref 6.5–8.1)

## 2015-12-04 LAB — LACTATE DEHYDROGENASE: LDH: 219 U/L — AB (ref 98–192)

## 2015-12-04 NOTE — Progress Notes (Signed)
[facility] CONSULT NOTE  Patient Care Team: Dennison MascotLemont Morrisey, MD as PCP - General (Family Medicine)  CHIEF COMPLAINTS/PURPOSE OF CONSULTATION: macrocytosis  # MACROCYTOSIS- Hb-N/platlelets- N; mild leucopenia- ? Sec to Liver dz vs depakote [currently off]   # Nov 2016- Elevated LFTs.   # STROKE [2014] left sided weakness [wheel chair];   HISTORY OF PRESENTING ILLNESS:  Stephanie Mason 65 y.o.  female history of stroke limited mobility- referred to us for further evaluation of macrocytosis.  Patient denies any history of liver disease; denies any alcohol issues. States that she was on Depakote/Keppra after a stroke; she was taken off few months ago.   Review of labs from November 2016 shows MCV 107 hemoglobin 13.7 slightly low white count of 2.9 and normal platelets of 160. AST 169 ALT 217 alkaline phosphatase 305. Normal bilirubin. Hepatitis C antibody negative. B12 folic acid normal. Ferritin elevated.  In general appetite is good. Denies any blood loss. Denies any swelling of the legs. She is chronic mild swelling of the left lower extremity.   ROS: A complete 10 point review of system is done which is negative except mentioned above in history of present illness  MEDICAL HISTORY:  Past Medical History  Diagnosis Date  . Stroke (HCC)   . Hypertension   . Left hemiparesis (HCC)   . High cholesterol   . Seizures (HCC)   . OSA on CPAP     SURGICAL HISTORY: Past Surgical History  Procedure Laterality Date  . Tee without cardioversion N/A 09/22/2012    Procedure: TRANSESOPHAGEAL ECHOCARDIOGRAM (TEE);  Surgeon: Vesta MixerPhilip J Nahser, MD;  [facility];  Service: Cardiovascular;  Laterality: N/A;    SOCIAL HISTORY: at home; with wheel chair. Quit alcohol in 2014. Quit smoking many years ago.  Social History   Social History  . Marital Status: Single    Spouse Name: N/A  . Number of Children: 2  . Years of Education: 12   Occupational History  . CNA     Social History Main Topics  . Smoking status: Never Smoker   . Smokeless tobacco: Never Used  . Alcohol Use: No  . Drug Use: No  . Sexual Activity: Not on file   Other Topics Concern  . Not on file   Social History Narrative   Patient is single and lives with her significant other [place]( [name]).   Patient has two children.   Patient is disabled.   Patient has a high school education.   Patient is right-handed.   Patient drinks one cup of coffee most days, and tea occasionally.    FAMILY HISTORY: Family History  Problem Relation Age of Onset  . Stroke Mother   . Cancer Father     esophageal  . Coronary artery disease    . Heart failure Brother   . Breast cancer Maternal Aunt     ALLERGIES:  has No Known Allergies.  MEDICATIONS:  Current Outpatient Prescriptions  Medication Sig Dispense Refill  . acetaminophen (TYLENOL) 325 MG tablet Take 325 mg by mouth daily as needed for pain. For pain    . divalproex (DEPAKOTE SPRINKLES) 125 MG capsule Take 250 mg by mouth at bedtime.    . fluticasone (FLONASE) 50 MCG/ACT nasal spray Place 1 spray into the nose daily as needed. For nasal congestion    . gabapentin (NEURONTIN) 300 MG capsule Take 300 mg by mouth 3 (three) times daily.    Marland Kitchen. levETIRAcetam (KEPPRA) 500 MG tablet Take  500 mg by mouth every 12 (twelve) hours.    Marland Kitchen losartan (COZAAR) 50 MG tablet Take 50 mg by mouth daily. Take every other day.    . oxybutynin (DITROPAN-XL) 5 MG 24 hr tablet Take 5 mg by mouth daily.    . tizanidine (ZANAFLEX) 2 MG capsule Take 1 capsule (2 mg total) by mouth 3 (three) times daily. 90 capsule 1  . traMADol (ULTRAM) 50 MG tablet Take 1 tablet (50 mg total) by mouth every 6 (six) hours as needed for pain. 30 tablet 2   No current facility-administered medications for this visit.      Marland Kitchen  PHYSICAL EXAMINATION:   Filed Vitals:   12/04/15 1137  BP: 124/77  Pulse: 93  Temp: 97.4 F (36.3 C)  Resp: 18   Filed Weights    12/04/15 1137  Weight: 172 lb (78.019 kg)    GENERAL: Well-nourished well-developed; Alert, no distress and comfortable.   She is in a wheelchair. She is alone. Her left upper extremity is in a sling. EYES: no pallor or icterus OROPHARYNX: no thrush or ulceration; positive for dentures. NECK: supple, no masses felt LYMPH:  no palpable lymphadenopathy in the cervical, axillary or inguinal regions LUNGS: clear to auscultation and  No wheeze or crackles HEART/CVS: regular rate & rhythm and no murmurs; mild swelling in the left lower extremity. She has a left lower extremity brace. ABDOMEN: abdomen soft, non-tender and normal bowel sounds; Limited exam as patient cannot sit on the exam table. Musculoskeletal:no cyanosis of digits and no clubbing  PSYCH: alert & oriented x 3 with fluent speech NEURO: Chronic left hemiparesis. SKIN:  no rashes or significant lesions  LABORATORY DATA:  I have reviewed the data as listed Lab Results  Component Value Date   WBC 7.2 12/13/2012   HGB 12.6 12/13/2012   HCT 38.7 12/13/2012   MCV 99.2 12/13/2012   PLT 169 12/13/2012   No results for input(s): NA, K, CL, CO2, GLUCOSE, BUN, CREATININE, CALCIUM, GFRNONAA, GFRAA, PROT, ALBUMIN, AST, ALT, ALKPHOS, BILITOT, BILIDIR, IBILI in the last 8760 hours.   ASSESSMENT & PLAN:   # Macrocytosis- as per the labs in November 2016 MCV 107 hemoglobin 13.7 slightly low white count of 2.9 and normal platelets of 160. Unclear etiology- question liver disease versus related to history of Depakote/Keppra. Today I would recommend CBC CMP; LDH. Also recommend ultrasound of the abdomen to rule out liver disease or splenomegaly. As the [facility] in [facility]- we will check with the doctors at the program regarding the workup/imaging.  #  The above plan of care was discussed with the patient detailed. All questions were answered. She'll follow-up with me in 2-3 weeks to review the above blood work done of  care.  Thank you Dr. Van Clines for allowing me to participate in the care of your pleasant patient. Please do not hesitate to contact me with questions or concerns in the interim.  # 30 minutes face-to-face with the patient discussing the above plan of care; more than 50% of time spent on counseling and coordination.    Earna Coder, MD 12/04/2015 12:05 PM

## 2015-12-04 NOTE — Telephone Encounter (Signed)
Called Trula OreChristina, in insurance that patient has been scheduled next week for abdominal US.  [facility] has been contacted and they are okay with it.

## 2015-12-04 NOTE — Progress Notes (Signed)
Patient here today as new evaluation regarding macrocytosis. Referred by Idelle LeechKatie  Kwaschyn @ [facility].

## 2015-12-06 ENCOUNTER — Encounter: Payer: Self-pay | Admitting: *Deleted

## 2015-12-06 ENCOUNTER — Other Ambulatory Visit: Payer: Self-pay | Admitting: *Deleted

## 2015-12-06 DIAGNOSIS — R7989 Other specified abnormal findings of blood chemistry: Secondary | ICD-10-CM

## 2015-12-06 NOTE — Progress Notes (Signed)
Dr. Donneta RombergBrahmanday [facility].  We will cnl the ultrasound complete that is ordered for next week. Pt last had an u/s in Feb. 2017, which was wnl.  msg sent to cancer center scheduling to cnl this test and to contact pt that apt was cnl.  She will need to keep her md apt with Dr. Donneta RombergBrahmanday as scheduled per md.

## 2015-12-06 NOTE — Addendum Note (Signed)
Addended by: Louretta ShortenBRAHMANDAY, Jagdeep Ancheta R on: 12/06/2015 10:16 AM   Modules accepted: Orders

## 2015-12-06 NOTE — Progress Notes (Signed)
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 Elevated ferritin is likely acute phase reactant- rather than a cause of her elevated LFTs/iron overload in the liver from hemochromatosis. We can check genetic testing for hemochromatosis at next visit.

## 2015-12-12 ENCOUNTER — Ambulatory Visit: Payer: Medicare Other

## 2015-12-18 ENCOUNTER — Inpatient Hospital Stay: Payer: Medicare (Managed Care) | Attending: Internal Medicine | Admitting: Internal Medicine

## 2015-12-18 ENCOUNTER — Inpatient Hospital Stay: Payer: Medicare (Managed Care)

## 2015-12-18 VITALS — BP 123/81 | HR 82 | Temp 97.4°F | Resp 18 | Wt 172.0 lb

## 2015-12-18 DIAGNOSIS — R600 Localized edema: Secondary | ICD-10-CM | POA: Diagnosis not present

## 2015-12-18 DIAGNOSIS — Z803 Family history of malignant neoplasm of breast: Secondary | ICD-10-CM | POA: Diagnosis not present

## 2015-12-18 DIAGNOSIS — G4733 Obstructive sleep apnea (adult) (pediatric): Secondary | ICD-10-CM | POA: Insufficient documentation

## 2015-12-18 DIAGNOSIS — Z800 Family history of malignant neoplasm of digestive organs: Secondary | ICD-10-CM | POA: Insufficient documentation

## 2015-12-18 DIAGNOSIS — Z87891 Personal history of nicotine dependence: Secondary | ICD-10-CM | POA: Diagnosis not present

## 2015-12-18 DIAGNOSIS — Z823 Family history of stroke: Secondary | ICD-10-CM | POA: Insufficient documentation

## 2015-12-18 DIAGNOSIS — I10 Essential (primary) hypertension: Secondary | ICD-10-CM | POA: Insufficient documentation

## 2015-12-18 DIAGNOSIS — K760 Fatty (change of) liver, not elsewhere classified: Secondary | ICD-10-CM | POA: Diagnosis not present

## 2015-12-18 DIAGNOSIS — D7589 Other specified diseases of blood and blood-forming organs: Secondary | ICD-10-CM | POA: Insufficient documentation

## 2015-12-18 DIAGNOSIS — R748 Abnormal levels of other serum enzymes: Secondary | ICD-10-CM | POA: Insufficient documentation

## 2015-12-18 DIAGNOSIS — E7800 Pure hypercholesterolemia, unspecified: Secondary | ICD-10-CM | POA: Insufficient documentation

## 2015-12-18 DIAGNOSIS — I69354 Hemiplegia and hemiparesis following cerebral infarction affecting left non-dominant side: Secondary | ICD-10-CM | POA: Diagnosis not present

## 2015-12-18 DIAGNOSIS — R109 Unspecified abdominal pain: Secondary | ICD-10-CM | POA: Insufficient documentation

## 2015-12-18 DIAGNOSIS — Z79899 Other long term (current) drug therapy: Secondary | ICD-10-CM | POA: Insufficient documentation

## 2015-12-18 DIAGNOSIS — R7989 Other specified abnormal findings of blood chemistry: Secondary | ICD-10-CM

## 2015-12-18 NOTE — Progress Notes (Signed)
[facility] CONSULT NOTE  Patient Care Team: Dennison MascotLemont Morrisey, MD as PCP - General (Family Medicine)  CHIEF COMPLAINTS/PURPOSE OF CONSULTATION: macrocytosis  # MACROCYTOSIS- Hb-N/platlelets- N; mild leucopenia- ? Sec to Liver dz vs depakote [currently off] Ferritin 1100/ Sat 31%; Hemochromatosis genotyping-Pending.   # Nov 2016- Elevated LFTs ? Jan 2017- US fatty liver.   # STROKE [2014] left sided weakness [wheel chair];   HISTORY OF PRESENTING ILLNESS:  Stephanie Mason 65 y.o.  female history of stroke limited mobility has been referred to us for further evaluation of macrocytosis/elevated ferritin about 1100.   Patient continues to have any abdominal pain. Denies any nausea vomiting. In general appetite is good. Denies any blood loss. Denies any swelling of the legs. She is chronic mild swelling of the left lower extremity.   ROS: A complete 10 point review of system is done which is negative except mentioned above in history of present illness  MEDICAL HISTORY:  Past Medical History  Diagnosis Date  . Stroke (HCC) 2014  . Hypertension   . Left hemiparesis (HCC)   . High cholesterol   . Seizures (HCC)   . OSA on CPAP   . Left-sided weakness     r/t to stroke 2014    SURGICAL HISTORY: Past Surgical History  Procedure Laterality Date  . Tee without cardioversion N/A 09/22/2012    Procedure: TRANSESOPHAGEAL ECHOCARDIOGRAM (TEE);  Surgeon: Vesta MixerPhilip J Nahser, MD;  [facility];  Service: Cardiovascular;  Laterality: N/A;    SOCIAL HISTORY: at home; with wheel chair. Quit alcohol in 2014. Quit smoking many years ago.  Social History   Social History  . Marital Status: Single    Spouse Name: N/A  . Number of Children: 2  . Years of Education: 12   Occupational History  . CNA    Social History Main Topics  . Smoking status: Never Smoker   . Smokeless tobacco: Never Used  . Alcohol Use: No     Comment: h/o alcohol. stopped using in 2014  . Drug  Use: No  . Sexual Activity: No   Other Topics Concern  . Not on file   Social History Narrative   Patient is single and lives with her significant other Stephanie Mason( [name] McCollum).   Patient has two children.   Patient is disabled.   Patient has a high school education.   Patient is right-handed.   Patient drinks one cup of coffee most days, and tea occasionally.    FAMILY HISTORY: Family History  Problem Relation Age of Onset  . Stroke Mother   . Cancer Father     esophageal  . Coronary artery disease    . Heart failure Brother   . Breast cancer Maternal Aunt     ALLERGIES:  has No Known Allergies.  MEDICATIONS:  Current Outpatient Prescriptions  Medication Sig Dispense Refill  . acetaminophen (TYLENOL) 325 MG tablet Take 325 mg by mouth daily as needed for pain. For pain    . fluticasone (FLONASE) 50 MCG/ACT nasal spray Place 1 spray into the nose daily as needed. For nasal congestion    . gabapentin (NEURONTIN) 300 MG capsule Take 300 mg by mouth 3 (three) times daily.    Marland Kitchen. losartan (COZAAR) 50 MG tablet Take 50 mg by mouth daily. Take every other day.    . oxybutynin (DITROPAN-XL) 5 MG 24 hr tablet Take 5 mg by mouth daily.    . tizanidine (ZANAFLEX) 2 MG capsule Take 1 capsule (  2 mg total) by mouth 3 (three) times daily. 90 capsule 1  . traMADol (ULTRAM) 50 MG tablet Take 1 tablet (50 mg total) by mouth every 6 (six) hours as needed for pain. 30 tablet 2   No current facility-administered medications for this visit.      Marland Kitchen  PHYSICAL EXAMINATION:   Filed Vitals:   12/18/15 1148  BP: 123/81  Pulse: 82  Temp: 97.4 F (36.3 C)  Resp: 18   Filed Weights   12/18/15 1148  Weight: 172 lb (78.019 kg)    GENERAL: Well-nourished well-developed; Alert, no distress and comfortable.   She is in a wheelchair. She is alone. Her left upper extremity is in a sling.   LABORATORY DATA:  I have reviewed the data as listed Lab Results  Component Value Date   WBC 3.1*  12/04/2015   HGB 14.3 12/04/2015   HCT 42.5 12/04/2015   MCV 109.3* 12/04/2015   PLT 145* 12/04/2015    Recent Labs  12/04/15 1250  NA 136  K 4.0  CL 104  CO2 28  GLUCOSE 310*  BUN 7  CREATININE 0.76  CALCIUM 8.8*  GFRNONAA >60  GFRAA >60  PROT 8.1  ALBUMIN 3.6  AST 278*  ALT 295*  ALKPHOS 328*  BILITOT 1.0     ASSESSMENT & PLAN:   # Macrocytosis- as per the labs in November 2016 MCV 107 hemoglobin 13.7 slightly low white count of 2.9 and normal platelets of 160. Unclear etiology- question liver disease versus related to history of Depakote/Keppra. Ferritin 1100 saturation 31%. Unlikely, hemochromatosis. Hemochromatosis genotype working pending. If it is positive I would recommend phlebotomies. If negative I suspect patient's elevated ferritin is acute phase reactant/from liver disease.  # Liver disease elevated LFTs- AST ALT around 200s normal bilirubin. Unclear etiology. Ultrasound abdomen in January 2 017- no splenomegaly/fatty liver. Recommend GI evaluation.  # Recommend follow-up in 6 months with CBC /CMP LFTs and iron studies/ferritin- 1 week prior.  I spoke to pt's PACE doctor re: above plan.   # 15 minutes face-to-face with the patient discussing the above plan of care; more than 50% of time spent on counseling and coordination.     Earna Coder, MD 12/18/2015 11:53 AM

## 2015-12-21 LAB — HEMOCHROMATOSIS DNA-PCR(C282Y,H63D)

## 2016-01-15 ENCOUNTER — Encounter: Payer: Self-pay | Admitting: *Deleted

## 2016-01-16 ENCOUNTER — Ambulatory Visit
Admission: RE | Admit: 2016-01-16 | Payer: Medicare (Managed Care) | Source: Ambulatory Visit | Admitting: Gastroenterology

## 2016-01-16 ENCOUNTER — Encounter: Admission: RE | Payer: Self-pay | Source: Ambulatory Visit

## 2016-01-16 SURGERY — COLONOSCOPY WITH PROPOFOL
Anesthesia: General

## 2016-03-21 ENCOUNTER — Other Ambulatory Visit: Payer: Self-pay | Admitting: Gastroenterology

## 2016-03-21 DIAGNOSIS — R1011 Right upper quadrant pain: Secondary | ICD-10-CM

## 2016-03-21 DIAGNOSIS — R945 Abnormal results of liver function studies: Principal | ICD-10-CM

## 2016-03-21 DIAGNOSIS — R7989 Other specified abnormal findings of blood chemistry: Secondary | ICD-10-CM

## 2016-03-26 ENCOUNTER — Ambulatory Visit: Payer: Medicare (Managed Care)

## 2016-03-28 ENCOUNTER — Ambulatory Visit
Admission: RE | Admit: 2016-03-28 | Discharge: 2016-03-28 | Disposition: A | Discharge status: Home / Self Care | Payer: Medicare (Managed Care) | Source: Ambulatory Visit | Attending: Gastroenterology | Admitting: Gastroenterology

## 2016-03-28 DIAGNOSIS — R945 Abnormal results of liver function studies: Secondary | ICD-10-CM

## 2016-03-28 DIAGNOSIS — R7989 Other specified abnormal findings of blood chemistry: Secondary | ICD-10-CM | POA: Insufficient documentation

## 2016-03-28 DIAGNOSIS — R1011 Right upper quadrant pain: Secondary | ICD-10-CM | POA: Diagnosis not present

## 2016-04-02 ENCOUNTER — Other Ambulatory Visit: Payer: Self-pay | Admitting: Gastroenterology

## 2016-04-02 DIAGNOSIS — R7989 Other specified abnormal findings of blood chemistry: Secondary | ICD-10-CM

## 2016-04-02 DIAGNOSIS — R945 Abnormal results of liver function studies: Principal | ICD-10-CM

## 2016-04-08 ENCOUNTER — Other Ambulatory Visit: Payer: Self-pay | Admitting: Radiology

## 2016-04-09 ENCOUNTER — Other Ambulatory Visit: Payer: Self-pay | Admitting: Physician Assistant

## 2016-04-09 ENCOUNTER — Ambulatory Visit: Payer: Medicare (Managed Care)

## 2016-04-10 ENCOUNTER — Ambulatory Visit: Admission: RE | Admit: 2016-04-10 | Payer: Medicare Other | Source: Ambulatory Visit

## 2016-06-18 ENCOUNTER — Inpatient Hospital Stay: Payer: Medicare Other | Attending: Internal Medicine

## 2016-06-20 ENCOUNTER — Other Ambulatory Visit: Payer: Self-pay

## 2016-06-20 ENCOUNTER — Inpatient Hospital Stay: Payer: Medicare Other | Admitting: Internal Medicine

## 2016-07-11 ENCOUNTER — Ambulatory Visit
Admission: RE | Admit: 2016-07-11 | Payer: Medicare (Managed Care) | Source: Ambulatory Visit | Admitting: Gastroenterology

## 2016-07-11 ENCOUNTER — Encounter: Admission: RE | Payer: Self-pay | Source: Ambulatory Visit

## 2016-07-11 SURGERY — COLONOSCOPY WITH PROPOFOL
Anesthesia: General

## 2016-08-12 DIAGNOSIS — I82409 Acute embolism and thrombosis of unspecified deep veins of unspecified lower extremity: Secondary | ICD-10-CM

## 2016-08-12 HISTORY — DX: Acute embolism and thrombosis of unspecified deep veins of unspecified lower extremity: I82.409

## 2016-11-27 ENCOUNTER — Other Ambulatory Visit: Payer: Self-pay | Admitting: Family

## 2016-11-27 DIAGNOSIS — R928 Other abnormal and inconclusive findings on diagnostic imaging of breast: Secondary | ICD-10-CM

## 2016-12-23 ENCOUNTER — Other Ambulatory Visit: Payer: Medicare (Managed Care)

## 2016-12-24 ENCOUNTER — Ambulatory Visit: Payer: Medicare (Managed Care)

## 2016-12-24 ENCOUNTER — Other Ambulatory Visit: Payer: Medicare (Managed Care)

## 2017-01-09 ENCOUNTER — Ambulatory Visit
Admission: RE | Admit: 2017-01-09 | Discharge: 2017-01-09 | Disposition: A | Discharge status: Home / Self Care | Payer: Medicare (Managed Care) | Source: Ambulatory Visit | Attending: Family | Admitting: Family

## 2017-01-09 DIAGNOSIS — N6321 Unspecified lump in the left breast, upper outer quadrant: Secondary | ICD-10-CM | POA: Insufficient documentation

## 2017-01-09 DIAGNOSIS — R928 Other abnormal and inconclusive findings on diagnostic imaging of breast: Secondary | ICD-10-CM

## 2017-05-26 ENCOUNTER — Other Ambulatory Visit: Payer: Self-pay | Admitting: Family

## 2017-06-09 ENCOUNTER — Other Ambulatory Visit: Payer: Self-pay | Admitting: Family

## 2017-06-09 DIAGNOSIS — R928 Other abnormal and inconclusive findings on diagnostic imaging of breast: Secondary | ICD-10-CM

## 2017-07-16 ENCOUNTER — Ambulatory Visit
Admission: RE | Admit: 2017-07-16 | Discharge: 2017-07-16 | Disposition: A | Discharge status: Home / Self Care | Payer: Medicare (Managed Care) | Source: Ambulatory Visit | Attending: Family | Admitting: Family

## 2017-07-16 DIAGNOSIS — R928 Other abnormal and inconclusive findings on diagnostic imaging of breast: Secondary | ICD-10-CM

## 2018-01-07 ENCOUNTER — Other Ambulatory Visit: Payer: Self-pay | Admitting: Family

## 2018-01-07 DIAGNOSIS — R928 Other abnormal and inconclusive findings on diagnostic imaging of breast: Secondary | ICD-10-CM

## 2018-02-03 ENCOUNTER — Ambulatory Visit
Admission: RE | Admit: 2018-02-03 | Discharge: 2018-02-03 | Disposition: A | Discharge status: Home / Self Care | Payer: Medicare (Managed Care) | Source: Ambulatory Visit | Attending: Family | Admitting: Family

## 2018-02-03 DIAGNOSIS — R928 Other abnormal and inconclusive findings on diagnostic imaging of breast: Secondary | ICD-10-CM | POA: Diagnosis not present

## 2019-01-14 ENCOUNTER — Other Ambulatory Visit: Payer: Self-pay | Admitting: Family Medicine

## 2019-01-14 DIAGNOSIS — Z1231 Encounter for screening mammogram for malignant neoplasm of breast: Secondary | ICD-10-CM

## 2019-11-15 ENCOUNTER — Other Ambulatory Visit: Payer: Self-pay | Admitting: Family Medicine

## 2019-11-15 DIAGNOSIS — Z1231 Encounter for screening mammogram for malignant neoplasm of breast: Secondary | ICD-10-CM

## 2020-03-29 ENCOUNTER — Ambulatory Visit
Admission: RE | Admit: 2020-03-29 | Discharge: 2020-03-29 | Disposition: A | Discharge status: Home / Self Care | Payer: Medicare (Managed Care) | Source: Ambulatory Visit | Attending: Family Medicine | Admitting: Family Medicine

## 2020-03-29 ENCOUNTER — Other Ambulatory Visit: Payer: Self-pay

## 2020-03-29 DIAGNOSIS — Z1231 Encounter for screening mammogram for malignant neoplasm of breast: Secondary | ICD-10-CM

## 2020-04-09 ENCOUNTER — Other Ambulatory Visit: Payer: Self-pay

## 2020-04-09 ENCOUNTER — Inpatient Hospital Stay
Admission: EM | Admit: 2020-04-09 | Discharge: 2020-04-20 | DRG: 064 | Disposition: A | Discharge status: Skilled Nursing Facility | Payer: Medicare (Managed Care) | Attending: Internal Medicine | Admitting: Internal Medicine

## 2020-04-09 ENCOUNTER — Emergency Department: Payer: Medicare (Managed Care)

## 2020-04-09 ENCOUNTER — Encounter: Payer: Self-pay | Admitting: Emergency Medicine

## 2020-04-09 DIAGNOSIS — K5900 Constipation, unspecified: Secondary | ICD-10-CM | POA: Diagnosis present

## 2020-04-09 DIAGNOSIS — E1141 Type 2 diabetes mellitus with diabetic mononeuropathy: Secondary | ICD-10-CM | POA: Diagnosis present

## 2020-04-09 DIAGNOSIS — R52 Pain, unspecified: Secondary | ICD-10-CM

## 2020-04-09 DIAGNOSIS — G936 Cerebral edema: Secondary | ICD-10-CM | POA: Diagnosis present

## 2020-04-09 DIAGNOSIS — I63511 Cerebral infarction due to unspecified occlusion or stenosis of right middle cerebral artery: Secondary | ICD-10-CM | POA: Diagnosis not present

## 2020-04-09 DIAGNOSIS — R29810 Facial weakness: Secondary | ICD-10-CM | POA: Diagnosis present

## 2020-04-09 DIAGNOSIS — R4781 Slurred speech: Secondary | ICD-10-CM | POA: Diagnosis present

## 2020-04-09 DIAGNOSIS — Z86718 Personal history of other venous thrombosis and embolism: Secondary | ICD-10-CM

## 2020-04-09 DIAGNOSIS — R29715 NIHSS score 15: Secondary | ICD-10-CM | POA: Diagnosis present

## 2020-04-09 DIAGNOSIS — R519 Headache, unspecified: Secondary | ICD-10-CM

## 2020-04-09 DIAGNOSIS — J320 Chronic maxillary sinusitis: Secondary | ICD-10-CM

## 2020-04-09 DIAGNOSIS — Z20822 Contact with and (suspected) exposure to covid-19: Secondary | ICD-10-CM | POA: Diagnosis present

## 2020-04-09 DIAGNOSIS — M79606 Pain in leg, unspecified: Secondary | ICD-10-CM

## 2020-04-09 DIAGNOSIS — D7589 Other specified diseases of blood and blood-forming organs: Secondary | ICD-10-CM | POA: Diagnosis present

## 2020-04-09 DIAGNOSIS — Z79899 Other long term (current) drug therapy: Secondary | ICD-10-CM

## 2020-04-09 DIAGNOSIS — D539 Nutritional anemia, unspecified: Secondary | ICD-10-CM | POA: Diagnosis present

## 2020-04-09 DIAGNOSIS — E876 Hypokalemia: Secondary | ICD-10-CM | POA: Diagnosis not present

## 2020-04-09 DIAGNOSIS — E871 Hypo-osmolality and hyponatremia: Secondary | ICD-10-CM | POA: Diagnosis not present

## 2020-04-09 DIAGNOSIS — G40909 Epilepsy, unspecified, not intractable, without status epilepticus: Secondary | ICD-10-CM | POA: Diagnosis present

## 2020-04-09 DIAGNOSIS — R400 Somnolence: Secondary | ICD-10-CM | POA: Diagnosis present

## 2020-04-09 DIAGNOSIS — H53462 Homonymous bilateral field defects, left side: Secondary | ICD-10-CM | POA: Diagnosis present

## 2020-04-09 DIAGNOSIS — R509 Fever, unspecified: Secondary | ICD-10-CM | POA: Diagnosis present

## 2020-04-09 DIAGNOSIS — I639 Cerebral infarction, unspecified: Secondary | ICD-10-CM

## 2020-04-09 DIAGNOSIS — I10 Essential (primary) hypertension: Secondary | ICD-10-CM | POA: Diagnosis present

## 2020-04-09 DIAGNOSIS — M436 Torticollis: Secondary | ICD-10-CM | POA: Diagnosis present

## 2020-04-09 DIAGNOSIS — I69354 Hemiplegia and hemiparesis following cerebral infarction affecting left non-dominant side: Secondary | ICD-10-CM

## 2020-04-09 HISTORY — DX: Type 2 diabetes mellitus without complications: E11.9

## 2020-04-09 LAB — CBC WITH DIFFERENTIAL/PLATELET
Abs Immature Granulocytes: 0.02 10*3/uL (ref 0.00–0.07)
Basophils Absolute: 0 10*3/uL (ref 0.0–0.1)
Basophils Relative: 1 %
Eosinophils Absolute: 0 10*3/uL (ref 0.0–0.5)
Eosinophils Relative: 0 %
HCT: 48.6 % — ABNORMAL HIGH (ref 36.0–46.0)
Hemoglobin: 15.8 g/dL — ABNORMAL HIGH (ref 12.0–15.0)
Immature Granulocytes: 0 %
Lymphocytes Relative: 9 %
Lymphs Abs: 0.8 10*3/uL (ref 0.7–4.0)
MCH: 35 pg — ABNORMAL HIGH (ref 26.0–34.0)
MCHC: 32.5 g/dL (ref 30.0–36.0)
MCV: 107.5 fL — ABNORMAL HIGH (ref 80.0–100.0)
Monocytes Absolute: 0.4 10*3/uL (ref 0.1–1.0)
Monocytes Relative: 5 %
Neutro Abs: 7.1 10*3/uL (ref 1.7–7.7)
Neutrophils Relative %: 85 %
Platelets: 213 10*3/uL (ref 150–400)
RBC: 4.52 MIL/uL (ref 3.87–5.11)
RDW: 12.1 % (ref 11.5–15.5)
WBC: 8.3 10*3/uL (ref 4.0–10.5)
nRBC: 0 % (ref 0.0–0.2)

## 2020-04-09 LAB — BASIC METABOLIC PANEL
Anion gap: 9 (ref 5–15)
BUN: 10 mg/dL (ref 8–23)
CO2: 28 mmol/L (ref 22–32)
Calcium: 9.6 mg/dL (ref 8.9–10.3)
Chloride: 102 mmol/L (ref 98–111)
Creatinine, Ser: 0.88 mg/dL (ref 0.44–1.00)
GFR calc Af Amer: >60 mL/min (ref >60)
GFR calc non Af Amer: >60 mL/min (ref >60)
Glucose, Bld: 148 mg/dL — ABNORMAL HIGH (ref 70–99)
Potassium: 4.3 mmol/L (ref 3.5–5.1)
Sodium: 139 mmol/L (ref 135–145)

## 2020-04-09 MED ORDER — FLUTICASONE PROPIONATE 50 MCG/ACT NA SUSP: 1.0000 | Freq: Every day | NASAL | 0 refills | Status: DC

## 2020-04-09 MED ORDER — METOCLOPRAMIDE HCL 10 MG PO TABS: 10.0000 mg | ORAL_TABLET | Freq: Three times a day (TID) | ORAL | 0 refills | Status: AC | PRN

## 2020-04-09 MED ORDER — METOCLOPRAMIDE HCL 5 MG/ML IJ SOLN
10.0000 mg | Freq: Once | INTRAMUSCULAR | Status: AC
  Administered 2020-04-09: 10 mg via INTRAVENOUS
  Filled 2020-04-09: qty 2

## 2020-04-09 NOTE — ED Notes (Signed)
Pt back from CT at this time 

## 2020-04-09 NOTE — ED Triage Notes (Signed)
Pt arrives via [facility] with c/o HA since yesterday. Pt has hx of CVA and has baseline left sided weakness. Pt c/o no new deficits. Per EMS, pt had BP 174/76, PR 82, and was 96% on RA. Pt is in NAD.

## 2020-04-09 NOTE — ED Notes (Signed)
MRI Stephanie Mason on phone to speak with pt.

## 2020-04-09 NOTE — ED Provider Notes (Signed)
[facility] Emergency Department Provider Note ____________________________________________   First MD Initiated Contact with Patient 04/09/20 2113     (approximate)  I have reviewed the triage vital signs and the nursing notes.   HISTORY  Chief Complaint Headache    HPI Stephanie Mason is a 69 y.o. female with PMH as noted below including 2 prior strokes with residual left-sided weakness who presents with a headache, primarily to the right side of her head above the right eye, intermittent over the last day, throbbing nature, and associated with nausea but no photophobia.  The patient denies a prior history of this headache.  She states that it feels like she has pressure and congestion in her sinus.  She denies any changes in her vision or difficulty speaking.  She denies any new weakness or numbness.  Along with the nausea, she did have one episode of vomiting earlier.  She was evaluated over the phone by nurse from her PACE program who felt that the patient seemed a bit "slow" so she was recommended to come to the ER.  Past Medical History:  Diagnosis Date  . Diabetes mellitus without complication (HCC)   . Hypertension   . Stroke [facility])     There are no problems to display for this patient.   History reviewed. No pertinent surgical history.  Prior to Admission medications   Medication Sig Start Date End Date Taking? Authorizing Provider  fluticasone (FLONASE) 50 MCG/ACT nasal spray Place 1 spray into both nostrils daily for 7 days. 04/09/20 04/16/20  Dionne Bucy, MD  metoCLOPramide (REGLAN) 10 MG tablet Take 1 tablet (10 mg total) by mouth every 8 (eight) hours as needed for up to 5 days for nausea (headache). 04/09/20 04/14/20  Dionne Bucy, MD    Allergies Patient has no known allergies.  No family history on file.  Social History Social History   Tobacco Use  . Smoking status: Never Smoker  . Smokeless tobacco: Never Used    Substance Use Topics  . Alcohol use: Never  . Drug use: Never    Review of Systems  Constitutional: No fever/chills. Eyes: No redness. ENT: No sore throat. Cardiovascular: Denies chest pain. Respiratory: Denies shortness of breath. Gastrointestinal: Positive for nausea and resolved vomiting. Genitourinary: Negative for dysuria.  Musculoskeletal: Negative for back pain. Skin: Negative for rash. Neurological: Positive for headache.  Negative for acute weakness or numbness.   ____________________________________________   PHYSICAL EXAM:  VITAL SIGNS: ED Triage Vitals  Enc Vitals Group     BP 04/09/20 2110 (!) 170/81     Pulse Rate 04/09/20 2110 72     Resp 04/09/20 2110 19     Temp 04/09/20 2110 99.1 F (37.3 C)     Temp Source 04/09/20 2110 Oral     SpO2 04/09/20 2110 100 %     Weight 04/09/20 2111 147 lb (66.7 kg)     Height 04/09/20 2111 5\' 7"  (1.702 m)     Head Circumference --      Peak Flow --      Pain Score 04/09/20 2111 4     Pain Loc --      Pain Edu? --      Excl. in GC? --     Constitutional: Alert and oriented.  Relatively well appearing and in no acute distress. Eyes: Conjunctivae are normal.  EOMI.  PERRLA. Head: Atraumatic. Nose: No congestion/rhinnorhea. Mouth/Throat: Mucous membranes are moist.   Neck: Normal range of motion.  Cardiovascular: Normal rate, regular rhythm.  Good peripheral circulation. Respiratory: Normal respiratory effort.  No retractions.  Gastrointestinal: No distention.  Musculoskeletal:  Extremities warm and well perfused.  Neurologic:  Normal speech and language.  Chronic left facial droop and upper and lower extremity weakness.  No weakness on the right. Skin:  Skin is warm and dry. No rash noted. Psychiatric: Mood and affect are normal. Speech and behavior are normal.  ____________________________________________   LABS (all labs ordered are listed, but only abnormal results are displayed)  Labs Reviewed  BASIC  METABOLIC PANEL - Abnormal; Notable for the following components:      Result Value   Glucose, Bld 148 (*)    All other components within normal limits  CBC WITH DIFFERENTIAL/PLATELET - Abnormal; Notable for the following components:   Hemoglobin 15.8 (*)    HCT 48.6 (*)    MCV 107.5 (*)    MCH 35.0 (*)    All other components within normal limits   ____________________________________________  EKG   ____________________________________________  RADIOLOGY  CT head: Large area of right-sided encephalomalacia with hypodensity in the right parietal lobe, likely subacute or chronic infarct CT maxillofacial: Right maxillary sinusitis MR brain: Pending  ____________________________________________   PROCEDURES  Procedure(s) performed: No  Procedures  Critical Care performed: No ____________________________________________   INITIAL IMPRESSION / ASSESSMENT AND PLAN / ED COURSE  Pertinent labs & imaging results that were available during my care of the patient were reviewed by me and considered in my medical decision making (see chart for details).  69 year old female with PMH as noted above including 2 prior strokes with residual left-sided weakness presents with headache since yesterday associated with nausea and one episode of vomiting.  It is mainly in the right side of her head above the right eye, and the patient states it feels like her sinuses.  She had contacted her nurse at the pace program, who felt that the patient sounded somewhat "slow" although was not necessarily confused.  The patient was advised to come to the ED.  On exam, the patient is alert and oriented, and overall well-appearing.  Her vital signs are normal except for mild hypertension and a borderline elevated temperature.  Physical exam reveals chronic left-sided facial droop and weakness, with no findings on the right.  Her speech does not appear slurred.  The patient has no meningeal signs.  She has no  photophobia.  The physical exam is otherwise unremarkable.  Differential includes migraine or other benign etiology of the headache, sinusitis, or less likely acute CNS cause such as ICH.  There is no clinical evidence for meningitis.  Given the patient's chronic deficits and abnormal neurologic exam we will obtain CT head and maxillofacial as well as obtain basic labs.  We will treat symptomatically with Reglan.  ----------------------------------------- 11:51 PM on 04/09/2020 -----------------------------------------  The patient states that the headache resolved with Reglan.  She continues to appear comfortable.  Lab work-up is unremarkable.  The CTs show right-sided maxillary sinusitis, which would correspond with the patient's symptoms. There is also an area of hypodensity on the right, which is likely chronic and related to the prior infarcts, however an acute or subacute infarct can not be completely ruled out.  I have ordered an MRI for further evaluation.  If the MRI is negative, anticipate the patient will be stable for discharge home with treatment for sinusitis.  I signed her out to the oncoming physician Dr. Don Perking.  ____________________________________________   FINAL CLINICAL IMPRESSION(S) /  ED DIAGNOSES  Final diagnoses:  Maxillary sinusitis, unspecified chronicity  Acute nonintractable headache, unspecified headache type      NEW MEDICATIONS STARTED DURING THIS VISIT:  New Prescriptions   FLUTICASONE (FLONASE) 50 MCG/ACT NASAL SPRAY    Place 1 spray into both nostrils daily for 7 days.   METOCLOPRAMIDE (REGLAN) 10 MG TABLET    Take 1 tablet (10 mg total) by mouth every 8 (eight) hours as needed for up to 5 days for nausea (headache).     Note:  This document was prepared using Dragon voice recognition software and may include unintentional dictation errors.   Dionne Bucy, MD 04/09/20 [phone]

## 2020-04-09 NOTE — ED Notes (Signed)
IV attempt x1 by this RN without success. Danelle Earthly RN called for [place] IV.

## 2020-04-09 NOTE — ED Notes (Signed)
Pt to Ct at this time.

## 2020-04-09 NOTE — Discharge Instructions (Signed)
You may take the Flonase as needed for the inflammation in your sinus.  You can also take over-the-counter Sudafed and Tylenol.  You may take the Reglan as needed for headache or nausea.  Return to the ER for new, worsening, or persistent severe headache, new or worsening weakness or numbness, vision changes, difficulty speaking, or any other new or worsening symptoms that concern you.

## 2020-04-10 ENCOUNTER — Observation Stay: Payer: Medicare (Managed Care)

## 2020-04-10 ENCOUNTER — Emergency Department: Payer: Medicare (Managed Care)

## 2020-04-10 ENCOUNTER — Inpatient Hospital Stay: Payer: Medicare (Managed Care)

## 2020-04-10 ENCOUNTER — Encounter: Payer: Self-pay | Admitting: Internal Medicine

## 2020-04-10 ENCOUNTER — Observation Stay (HOSPITAL_COMMUNITY)
Admit: 2020-04-10 | Discharge: 2020-04-10 | Disposition: A | Discharge status: Home / Self Care | Payer: Medicare (Managed Care) | Attending: Neurology | Admitting: Neurology

## 2020-04-10 DIAGNOSIS — E876 Hypokalemia: Secondary | ICD-10-CM | POA: Diagnosis not present

## 2020-04-10 DIAGNOSIS — Z20822 Contact with and (suspected) exposure to covid-19: Secondary | ICD-10-CM | POA: Diagnosis present

## 2020-04-10 DIAGNOSIS — E871 Hypo-osmolality and hyponatremia: Secondary | ICD-10-CM | POA: Diagnosis not present

## 2020-04-10 DIAGNOSIS — R29715 NIHSS score 15: Secondary | ICD-10-CM | POA: Diagnosis present

## 2020-04-10 DIAGNOSIS — I63231 Cerebral infarction due to unspecified occlusion or stenosis of right carotid arteries: Secondary | ICD-10-CM | POA: Diagnosis not present

## 2020-04-10 DIAGNOSIS — D709 Neutropenia, unspecified: Secondary | ICD-10-CM | POA: Diagnosis not present

## 2020-04-10 DIAGNOSIS — G8190 Hemiplegia, unspecified affecting unspecified side: Secondary | ICD-10-CM | POA: Diagnosis not present

## 2020-04-10 DIAGNOSIS — J320 Chronic maxillary sinusitis: Secondary | ICD-10-CM | POA: Diagnosis not present

## 2020-04-10 DIAGNOSIS — I69354 Hemiplegia and hemiparesis following cerebral infarction affecting left non-dominant side: Secondary | ICD-10-CM | POA: Diagnosis not present

## 2020-04-10 DIAGNOSIS — H53462 Homonymous bilateral field defects, left side: Secondary | ICD-10-CM | POA: Diagnosis present

## 2020-04-10 DIAGNOSIS — R4781 Slurred speech: Secondary | ICD-10-CM | POA: Diagnosis present

## 2020-04-10 DIAGNOSIS — I63511 Cerebral infarction due to unspecified occlusion or stenosis of right middle cerebral artery: Secondary | ICD-10-CM | POA: Diagnosis present

## 2020-04-10 DIAGNOSIS — R7881 Bacteremia: Secondary | ICD-10-CM | POA: Diagnosis not present

## 2020-04-10 DIAGNOSIS — Z86718 Personal history of other venous thrombosis and embolism: Secondary | ICD-10-CM | POA: Diagnosis not present

## 2020-04-10 DIAGNOSIS — M436 Torticollis: Secondary | ICD-10-CM | POA: Diagnosis present

## 2020-04-10 DIAGNOSIS — K5900 Constipation, unspecified: Secondary | ICD-10-CM | POA: Diagnosis present

## 2020-04-10 DIAGNOSIS — G40909 Epilepsy, unspecified, not intractable, without status epilepticus: Secondary | ICD-10-CM | POA: Diagnosis present

## 2020-04-10 DIAGNOSIS — I6389 Other cerebral infarction: Secondary | ICD-10-CM | POA: Diagnosis not present

## 2020-04-10 DIAGNOSIS — G936 Cerebral edema: Secondary | ICD-10-CM | POA: Diagnosis present

## 2020-04-10 DIAGNOSIS — R400 Somnolence: Secondary | ICD-10-CM | POA: Diagnosis present

## 2020-04-10 DIAGNOSIS — I10 Essential (primary) hypertension: Secondary | ICD-10-CM | POA: Diagnosis present

## 2020-04-10 DIAGNOSIS — D539 Nutritional anemia, unspecified: Secondary | ICD-10-CM | POA: Diagnosis present

## 2020-04-10 DIAGNOSIS — I639 Cerebral infarction, unspecified: Secondary | ICD-10-CM | POA: Diagnosis present

## 2020-04-10 DIAGNOSIS — R5081 Fever presenting with conditions classified elsewhere: Secondary | ICD-10-CM | POA: Diagnosis not present

## 2020-04-10 DIAGNOSIS — R509 Fever, unspecified: Secondary | ICD-10-CM | POA: Diagnosis present

## 2020-04-10 DIAGNOSIS — E1141 Type 2 diabetes mellitus with diabetic mononeuropathy: Secondary | ICD-10-CM | POA: Diagnosis present

## 2020-04-10 DIAGNOSIS — D7589 Other specified diseases of blood and blood-forming organs: Secondary | ICD-10-CM | POA: Diagnosis present

## 2020-04-10 DIAGNOSIS — R29810 Facial weakness: Secondary | ICD-10-CM | POA: Diagnosis present

## 2020-04-10 DIAGNOSIS — Z79899 Other long term (current) drug therapy: Secondary | ICD-10-CM | POA: Diagnosis not present

## 2020-04-10 LAB — URINE DRUG SCREEN, QUALITATIVE (ARMC ONLY)
Amphetamines, Ur Screen: NOT DETECTED
Barbiturates, Ur Screen: NOT DETECTED
Benzodiazepine, Ur Scrn: NOT DETECTED
Cannabinoid 50 Ng, Ur ~~LOC~~: NOT DETECTED
Cocaine Metabolite,Ur ~~LOC~~: NOT DETECTED
MDMA (Ecstasy)Ur Screen: NOT DETECTED
Methadone Scn, Ur: NOT DETECTED
Opiate, Ur Screen: NOT DETECTED
Phencyclidine (PCP) Ur S: NOT DETECTED
Tricyclic, Ur Screen: NOT DETECTED

## 2020-04-10 LAB — ECHOCARDIOGRAM COMPLETE
Height: 67 in
S' Lateral: 1.83 cm
Weight: 2352.01 oz

## 2020-04-10 LAB — SARS CORONAVIRUS 2 BY RT PCR (HOSPITAL ORDER, PERFORMED IN ~~LOC~~ HOSPITAL LAB): SARS Coronavirus 2: NEGATIVE

## 2020-04-10 MED ORDER — ASPIRIN EC 81 MG PO TBEC
81.0000 mg | DELAYED_RELEASE_TABLET | Freq: Every day | ORAL | Status: DC
  Administered 2020-04-10 – 2020-04-13 (×4): 81 mg via ORAL
  Filled 2020-04-10 (×4): qty 1

## 2020-04-10 MED ORDER — GABAPENTIN 300 MG PO CAPS: 300.0000 mg | ORAL_CAPSULE | Freq: Every day | ORAL | Status: DC

## 2020-04-10 MED ORDER — ACETAMINOPHEN 650 MG RE SUPP
650.0000 mg | RECTAL | Status: DC | PRN
  Administered 2020-04-17: 650 mg via RECTAL
  Filled 2020-04-10: qty 1

## 2020-04-10 MED ORDER — STROKE: EARLY STAGES OF RECOVERY BOOK: Freq: Once | Status: AC

## 2020-04-10 MED ORDER — SODIUM CHLORIDE 0.9% FLUSH
10.0000 mL | Freq: Two times a day (BID) | INTRAVENOUS | Status: DC
  Administered 2020-04-10 – 2020-04-19 (×16): 10 mL

## 2020-04-10 MED ORDER — IOHEXOL 350 MG/ML SOLN
75.0000 mL | Freq: Once | INTRAVENOUS | Status: AC | PRN
  Administered 2020-04-10: 75 mL via INTRAVENOUS
  Filled 2020-04-10: qty 75

## 2020-04-10 MED ORDER — SENNA 8.6 MG PO TABS
1.0000 | ORAL_TABLET | Freq: Every day | ORAL | Status: DC
  Administered 2020-04-11 – 2020-04-16 (×6): 8.6 mg via ORAL
  Filled 2020-04-10 (×7): qty 1

## 2020-04-10 MED ORDER — FLUTICASONE PROPIONATE 50 MCG/ACT NA SUSP
1.0000 | Freq: Every day | NASAL | Status: DC
  Administered 2020-04-10 – 2020-04-20 (×10): 1 via NASAL
  Filled 2020-04-10: qty 16

## 2020-04-10 MED ORDER — GABAPENTIN 300 MG PO CAPS: 600.0000 mg | ORAL_CAPSULE | Freq: Every day | ORAL | Status: DC

## 2020-04-10 MED ORDER — ACETAMINOPHEN 325 MG PO TABS
650.0000 mg | ORAL_TABLET | ORAL | Status: DC | PRN
  Administered 2020-04-11 – 2020-04-18 (×10): 650 mg via ORAL
  Filled 2020-04-10 (×11): qty 2

## 2020-04-10 MED ORDER — ENOXAPARIN SODIUM 40 MG/0.4ML ~~LOC~~ SOLN
40.0000 mg | SUBCUTANEOUS | Status: DC
  Administered 2020-04-10 – 2020-04-20 (×11): 40 mg via SUBCUTANEOUS
  Filled 2020-04-10 (×12): qty 0.4

## 2020-04-10 MED ORDER — GABAPENTIN 100 MG PO CAPS
200.0000 mg | ORAL_CAPSULE | Freq: Every morning | ORAL | Status: DC
  Administered 2020-04-11 – 2020-04-20 (×10): 200 mg via ORAL
  Filled 2020-04-10 (×10): qty 2

## 2020-04-10 MED ORDER — SODIUM CHLORIDE 0.9% FLUSH: 10.0000 mL | INTRAVENOUS | Status: DC | PRN

## 2020-04-10 MED ORDER — GABAPENTIN 400 MG PO CAPS
400.0000 mg | ORAL_CAPSULE | Freq: Every day | ORAL | Status: DC
  Administered 2020-04-11 – 2020-04-19 (×9): 400 mg via ORAL
  Filled 2020-04-10 (×11): qty 1

## 2020-04-10 MED ORDER — ACETAMINOPHEN 160 MG/5ML PO SOLN
650.0000 mg | ORAL | Status: DC | PRN
  Administered 2020-04-14: 650 mg
  Filled 2020-04-10 (×2): qty 20.3

## 2020-04-10 NOTE — ED Notes (Signed)
Pt taken to MRI  

## 2020-04-10 NOTE — ED Notes (Signed)
Resumed care from [employer]. Mid line in place.  meds given po and sq.   Pt awake and following verbal commands.  Iv midline not working.  Consult with iv team ordered again.

## 2020-04-10 NOTE — Progress Notes (Signed)
PT Cancellation Note  Patient Details Name: Timia Casselman MRN: [id number] DOB: Dec 13, 1950   Cancelled Treatment:    Reason Eval/Treat Not Completed: Other (comment) PT order received and chart reviewed. Pt is currently pending bilateral carotid US and CT angio of head and neck. Will hold until results have returned. Will continue to follow and attempt another date/time when pt medically appropriate and stable to participate in PT.   Frederich Chick 04/10/2020, 1:19 PM

## 2020-04-10 NOTE — ED Notes (Signed)
Midline working after iv team in with pt.  Pt to ct scan and mri.  Pt awake and talking.

## 2020-04-10 NOTE — ED Notes (Signed)
Pt alert. Midline in place.  Pt waiting on bed assignment.

## 2020-04-10 NOTE — ED Notes (Signed)
Pt back from CT. IV not working with CT. CT attempted IV with no success. IV team consult placed at this time by CT.

## 2020-04-10 NOTE — ED Provider Notes (Signed)
Procedures     ----------------------------------------- 8:33 AM on 04/10/2020 -----------------------------------------   MRI shows areas of acute infarct in the MCA and PCA territories on top of chronic encephalomalacia from prior infarct in ACA and MCA territories.  Discussed with the patient who still feels at her usual baseline state of health, no headache or vomiting.  She declines hospitalization and wishes to go home.  I will consult neurology for further evaluation and recommendations.  ----------------------------------------- 9:30 AM on 04/10/2020 -----------------------------------------  Case d/w pt's PACE PCP Dr. Arta Silence who agrees with hospitalization if pt is willing. She will talk to pt.   ----------------------------------------- 9:50 AM on 04/10/2020 -----------------------------------------  After discussion with her PCP, patient now agrees with hospitalization.    Sharman Cheek, MD 04/10/20 [phone]

## 2020-04-10 NOTE — Evaluation (Signed)
Occupational Therapy Evaluation Patient Details Name: Stephanie Mason MRN: [id number] DOB: [birth year] Today's Date: 04/10/2020    History of Present Illness Stephanie Mason is a 69 y.o. 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 Patient was in her usual state of health until yesterday when she developed onset of right periorbital headache, slurred speech, and blurry vision in her right eye. 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   Clinical Impression   Stephanie Mason was seen for OT evaluation this date. Prior to hospital admission, pt was MOD I for mobility using QC and requiring assist for I/ADLs 2/2 hx of LUE flaccidity. Pt lives c friend and endorses having PCA 3hrs/day, 5 days/week (3 weekdays and weekends). Reports son and daughter check in PRN. Pt presents to acute OT demonstrating impaired ADL performance and functional mobility 2/2 decreased LB access, functional strength/balance deficits, and decreased activity toelrance. Pt currently requires MIN A to exit R side of bed, brings flaccid L arm with her for joint protection. Anticipate CGA + SETUP for UB ADLs seated / long sitting. MAX A for LB ADLS bed level. MIN A + HHA sit<>stand at elevated stretcher height - tolerated 1 step L and 1 step R prior to B knees buckling. CGA + B knee block lateral scooting along stretcher. Pt required MOD A return to bed, assist for BLE mgmt. Pt would benefit from skilled OT to address noted impairments and functional limitations (see below for any additional details) in order to maximize safety and independence while minimizing falls risk and caregiver burden. Upon hospital discharge, recommend STR to maximize pt safety and return to PLOF.     Follow Up Recommendations  SNF    Equipment Recommendations  Other (comment) (TBD)     Recommendations for Other Services       Precautions / Restrictions Precautions Precautions: Fall Restrictions Weight Bearing Restrictions: No      Mobility Bed Mobility Overal bed mobility: Needs Assistance Bed Mobility: Supine to Sit;Sit to Supine     Supine to sit: Min assist;HOB elevated Sit to supine: Mod assist   General bed mobility comments: MOD A return to bed for BLE mgmt  Transfers Overall transfer level: Needs assistance Equipment used: 1 person hand held assist Transfers: Sit to/from Stand Sit to Stand: Min assist;From elevated surface         General transfer comment: 1 step L, 1 step R, knees buckled, return to sitting.     Balance Overall balance assessment: Needs assistance Sitting-balance support: Feet unsupported;Single extremity supported Sitting balance-Leahy Scale: Fair Sitting balance - Comments: B knee block 2/2 elevated stretcher height    Standing balance support: Single extremity supported Standing balance-Leahy Scale: Poor Standing balance comment: B knees buckling following ~43min standing            ADL either performed or assessed with clinical judgement   ADL Overall ADL's : Needs assistance/impaired      General ADL Comments: SBA + SETUP for UB ADLs seated / long sitting. MAX A for LB ADLS bed level     Vision Eyes closed t/o majority of session, no noticeable visual deficits observed.      Pertinent Vitals/Pain Pain Assessment: No/denies pain     Hand Dominance Right   Extremity/Trunk Assessment Upper Extremity Assessment Upper Extremity Assessment: LUE deficits/detail;RUE deficits/detail RUE  Deficits / Details: Grip 4+/5. AROM WFL grossly  LUE Deficits / Details: Flaccid LUE - pt states baseline. AAROM WFL.    Lower Extremity Assessment Lower Extremity Assessment: Generalized weakness (L weaker than R, performs SLR seated EOB. BLE tremor noted)       Communication Communication Communication: Other  (comment) (Unclear HoH vs lethargic, roused from sleep at start)   Cognition Arousal/Alertness: Awake/alert Behavior During Therapy: WFL for tasks assessed/performed Overall Cognitive Status: Within Functional Limits for tasks assessed         General Comments       Exercises Exercises: Other exercises Other Exercises Other Exercises: Pt educated re: OT role, DME recs, d/c recs, falls prevention, joint protection strategies Other Exercises: Simulated UB grooming, sup<>sit, sit<>stand, sitting/standing balance/tolerance   Shoulder Instructions      Home Living Family/patient expects to be discharged to:: Private residence Living Arrangements: Children;Non-relatives/Friends (PCA 3hrs/day 5 days/wk. friend stays c her. Dtr/son check in) Available Help at Discharge: Friend(s);Personal care attendant;Available 24 hours/day Type of Home: House Home Access: Ramped entrance     Home Layout:  (unclear )     Bathroom Shower/Tub: Walk-in Human resources officer: Handicapped height     Home Equipment: Cane - quad;Shower seat;Grab bars - tub/shower;Grab bars - toilet   Additional Comments: Unclear multi level, asked if 1 floor states no, asked if basement or second floor states no.       Prior Functioning/Environment Level of Independence: Needs assistance  Gait / Transfers Assistance Needed: Reports household mobility using QC, grab bars. W/c for community mobility - endorses self-propeling PRN ADL's / Homemaking Assistance Needed: Reports asssit for all ADLs. Receives meals on wheels, prepares for self.             OT Problem List: Decreased strength;Decreased activity tolerance;Impaired balance (sitting and/or standing);Decreased safety awareness;Impaired UE functional use      OT Treatment/Interventions: Self-care/ADL training;Therapeutic exercise;Energy conservation;DME and/or AE instruction;Therapeutic activities;Patient/family education;Balance training    OT  Goals(Current goals can be found in the care plan section) Acute Rehab OT Goals Patient Stated Goal: To return home OT Goal Formulation: With patient Time For Goal Achievement: 04/24/20 Potential to Achieve Goals: Good ADL Goals Pt Will Perform Eating: with set-up;sitting;with modified independence Pt Will Transfer to Toilet: with min guard assist;stand pivot transfer;bedside commode (c LRAD PRN) Additional ADL Goal #1: Pt will Independently verbalize x3 joint protection strategies to implement during daily activities  OT Frequency: Min 2X/week    AM-PAC OT "6 Clicks" Daily Activity     Outcome Measure Help from another person eating meals?: A Little Help from another person taking care of personal grooming?: A Little Help from another person toileting, which includes using toliet, bedpan, or urinal?: A Lot Help from another person bathing (including washing, rinsing, drying)?: A Lot Help from another person to put on and taking off regular upper body clothing?: A Little Help from another person to put on and taking off regular lower body clothing?: A Lot 6 Click Score: 15   End of Session Equipment Utilized During Treatment:  (L AFO ) Nurse Communication: Mobility status  Activity Tolerance: Patient tolerated treatment well Patient left: in bed;with call bell/phone within reach;with nursing/sitter in room (IV team and SLP in room end of session)  OT Visit Diagnosis: Other abnormalities of gait and mobility (R26.89);Muscle weakness (generalized) (M62.81);Hemiplegia and hemiparesis Hemiplegia - Right/Left: Left Hemiplegia - dominant/non-dominant: Non-Dominant Hemiplegia - caused by: Cerebral infarction  Time: 1350-1411 OT Time Calculation (min): 21 min Charges:  OT General Charges $OT Visit: 1 Visit OT Evaluation $OT Eval Moderate Complexity: 1 Mod OT Treatments $Self Care/Home Management : 8-22 mins  Kathie Dike, M.S. OTR/L  04/10/20, 4:28 PM  ascom  [phone]

## 2020-04-10 NOTE — ED Notes (Signed)
Assisted pt onto the bedpan at this time.

## 2020-04-10 NOTE — ED Notes (Signed)
Pt back from MRI 

## 2020-04-10 NOTE — Consult Note (Signed)
Requesting Physician: Dr. Luberta Robertson    Chief Complaint: Headache  History obtained from: Patient and Chart     HPI:                                                                                                                                       Stephanie Mason is a 69 y.o. 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  Patient was seen in the emergency department [place] and his headache.  Headache resolved with Reglan and IV fluids.  CT head was done which showed early hypodensity in the right posterior parietal lobe is a subacute infarction in addition to large area of encephalomalacia in the right MCA territory.  MRI brain confirms acute/subacute infarction neurology was consulted for further recommendations.     Date last known well: 04/08/2020 Time last known well: Unclear tPA Given: No, outside TPA window NIHSS: 15 Baseline MRS 3     Past Medical History:  Diagnosis Date   Diabetes mellitus without complication (HCC)    Hypertension    Stroke [facility])     History reviewed. No pertinent surgical history.  No family history on file. Social History:  reports that she has never smoked. She has never used smokeless tobacco. She reports that she does not drink alcohol and does not use drugs.  Allergies: No Known Allergies  Medications:                                                                                                                        I reviewed home medications   ROS:  14 systems reviewed and negative except above    Examination:                                                                                                      General: Appears well-developed  Psych:  Affect appropriate to situation Eyes: No scleral injection HENT: No OP obstrucion Head: Normocephalic.  Cardiovascular: Normal rate and regular rhythm.  Respiratory: Effort normal and breath sounds normal to anterior ascultation GI: Soft.  No distension. There is no tenderness.  Skin: WDI    Neurological Examination Mental Status: Alert, oriented, thought content appropriate.  Speech fluent without evidence of aphasia. Able to follow 3 step commands without difficulty. Cranial Nerves: II: Visual fields: Left homonymous hemianopsia III,IV, VI: ptosis not present, extra-ocular motions intact bilaterally, pupils equal, round, reactive to light and accommodation V,VII: Left facial droop, facial light touch sensation normal bilaterally VIII: hearing normal bilaterally IX,X: uvula rises symmetrically XI: bilateral shoulder shrug XII: midline tongue extension Motor: Right : Upper extremity   5/5    Left:     Upper extremity   1/5  Lower extremity   5/5     Lower extremity   2/5 Tone and bulk:normal tone throughout; no atrophy noted Sensory: Sensory neglect to light touch on the left lower extremity Deep Tendon Reflexes: 4+ patella on the left with ankle clonus, 2+ on the right patella Plantars: Right: downgoing   Left: downgoing Cerebellar: No ataxia in the right upper and lower extremities      Lab Results: Basic Metabolic Panel: Recent Labs  Lab 04/09/20 2131  NA 139  K 4.3  CL 102  CO2 28  GLUCOSE 148*  BUN 10  CREATININE 0.88  CALCIUM 9.6    CBC: Recent Labs  Lab 04/09/20 2131  WBC 8.3  NEUTROABS 7.1  HGB 15.8*  HCT 48.6*  MCV 107.5*  PLT 213    Coagulation Studies: No results for input(s): LABPROT, INR in the last 72 hours.  Imaging: CT Head Wo Contrast  Result Date: 04/09/2020 CLINICAL DATA:  Headache, prior stroke EXAM: CT HEAD WITHOUT CONTRAST TECHNIQUE: Contiguous axial images were obtained from the base of the skull through the vertex without  intravenous contrast. COMPARISON:  None. FINDINGS: Brain: Large area of encephalomalacia involving the right frontal lobe. There is hypodensity involving the posterior right parietal lobe. No large extra-axial collections. No midline shift. There is ex vacuo dilatation of the right anterior horn of the lateral ventricle. Vascular: No hyperdense vessel or unexpected calcification. Skull: The skull is intact. No fracture or focal lesion identified. Sinuses/Orbits: The visualized paranasal sinuses and mastoid air cells are clear. The orbits and globes intact. Other: None Osseous: No acute fracture or other significant osseous abnormality.The nasal bone, mandibles, zygomatic arches and pterygoid plates are intact. Orbits: No fracture identified. Unremarkable appearance of globes and orbits. Sinuses: There is near complete opacification of the right maxillary sinus. A small amount of mucosal thickening seen within the left maxillary sinus. Soft tissues:  No acute findings. Limited intracranial: No acute findings. IMPRESSION: Large area of encephalomalacia  involving the right MCA territory with hypodensity involving the right posterior parietal lobe likely chronic or subacute infarct. If clinical concern remains would recommend MRI for further evaluation. Findings suggestive of right maxillary sinusitis. Electronically Signed   By: Jonna ClarkBindu  Avutu M.D.   On: 04/09/2020 22:10   MR BRAIN WO CONTRAST  Result Date: 04/10/2020 CLINICAL DATA:  Stroke follow-up. History of stroke with new headache. EXAM: MRI HEAD WITHOUT CONTRAST TECHNIQUE: Multiplanar, multiecho pulse sequences of the brain and surrounding structures were obtained without intravenous contrast. COMPARISON:  Head CT from yesterday FINDINGS: Brain: Large remote right cerebral infarct affecting the ACA territory and upper division MCA territory with dense encephalomalacia. Restricted diffusion is seen in the remaining right cerebral cortex, also involving white  matter. This is attributed to infarct rather than seizure phenomenon given the this spans both the MCA and PCA distributions but is still most likely acute infarct. The right PCA territory appeared spared on prior head CT but is involved on this study. Wallerian degeneration in the right cortical spinal tracts. No acute infarct in the left hemisphere or posterior fossa. No mass, hydrocephalus, or acute hemorrhage. Vascular: No flow void seen in the right ICA until the supraclinoid segment. Skull and upper cervical spine: Normal marrow signal Sinuses/Orbits: Bilateral maxillary sinus mucosal thickening with fluid/secretions on the right. Maxillofacial CT was performed yesterday. IMPRESSION: 1. Acute inferior division right MCA territory infarct. Acute right PCA territory infarct (which occurred even more recently based on the prior head CT). 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 2. Pre-existing right ACA and upper division MCA territory infarcts. 3. 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 Electronically Signed   By: Marnee SpringJonathon  Watts M.D.   On: 04/10/2020 08:13   CT Maxillofacial Wo Contrast  Result Date: 04/09/2020 CLINICAL DATA:  Headache, prior stroke EXAM: CT HEAD WITHOUT CONTRAST TECHNIQUE: Contiguous axial images were obtained from the base of the skull through the vertex without intravenous contrast. COMPARISON:  None. FINDINGS: Brain: Large area of encephalomalacia involving the right frontal lobe. There is hypodensity involving the posterior right parietal lobe. No large extra-axial collections. No midline shift. There is ex vacuo dilatation of the right anterior horn of the lateral ventricle. Vascular: No hyperdense vessel or unexpected calcification. Skull: The skull is intact. No fracture or focal lesion identified. Sinuses/Orbits: The visualized paranasal sinuses and mastoid air cells are clear. The orbits and globes intact. Other: None Osseous: No acute  fracture or other significant osseous abnormality.The nasal bone, mandibles, zygomatic arches and pterygoid plates are intact. Orbits: No fracture identified. Unremarkable appearance of globes and orbits. Sinuses: There is near complete opacification of the right maxillary sinus. A small amount of mucosal thickening seen within the left maxillary sinus. Soft tissues:  No acute findings. Limited intracranial: No acute findings. IMPRESSION: Large area of encephalomalacia involving the right MCA territory with hypodensity involving the right posterior parietal lobe likely chronic or subacute infarct. If clinical concern remains would recommend MRI for further evaluation. Findings suggestive of right maxillary sinusitis. Electronically Signed   By: Jonna ClarkBindu  Avutu M.D.   On: 04/09/2020 22:10     ASSESSMENT AND PLAN   69 y.o. 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  MRI brain confirms large right parietal and occipital lobe infarctions consistent with inferior division right MCA and  PCA terrioty infarction-likely due to right PCA territory supplied by large posterior communicating artery.  Infarction is fairly large, however due to atrophy and area right encephalomalacia there appears to be space for expansison and hence patient is low risk for developing malignant cerebral edema.   Etiology of stroke to be determined: Suspect right carotid disease vs embolic vs less likely vasospasm    Acute/Subacute MCA and PCA territory infarctions Chronic right ACA and MCA infarction Cytotoxic cerebral edema   Recommendations #CTA of the head and neck to evaluate for right carotid disease #Transthoracic Echo  # Start patient on ASA 81mg  daily #Start or continue Atorvastatin 40 mg/other high intensity statin # BP goal: 185/1 10 mmHg # HBAIC and  Lipid profile # Telemetry monitoring # Frequent neuro checks # stroke swallow screen #Urine drug screen   Cytotoxic cerebral edema -Low risk for developing malignant cerebral edema due to prior large right MCA stroke resulting in atrophy -Avoid hypotonic fluids -Frequent neurochecks -Repeat CT head in 24 hours    Please page stroke NP  Or  PA  Or MD from 8am -4 pm  as this patient from this time will be  followed by the stroke.   You can look them up on [email or web address]  Password [facility]   Pixie Burgener [facility] Pager Number [phone]

## 2020-04-10 NOTE — Evaluation (Signed)
Clinical/Bedside Swallow Evaluation Patient Details  Name: Stephanie Mason MRN: [id number] Date of Birth: 10/15/1950  Today's Date: 04/10/2020 Time: SLP Start Time (ACUTE ONLY): 1350 SLP Stop Time (ACUTE ONLY): 1444 SLP Time Calculation (min) (ACUTE ONLY): 54 min  Past Medical History:  Past Medical History:  Diagnosis Date  . Diabetes mellitus without complication (HCC)   . Hypertension   . Stroke [facility])    Past Surgical History: History reviewed. No pertinent surgical history. HPI:  Per admitting H & P "Stephanie Mason is an 69 y.o. female with PMH significant for 2 strokes in the past.  Her first stroke was several decades ago and occurred 6 months after starting birth control pills.  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  Patient states she is on 81 mg aspirin a day.  Patient specifically denies any history of hypertension, diabetes or dyslipidemia."   Assessment / Plan / Recommendation Clinical Impression  Bedside swallow eval today revealed mild risk of aspiration, but apparent oral phase dysphagia. No s/s of aspiration with any tested consistency. Very limited amounts of Po were given as Pt is scheduled for CT angiogram later today. Pt tolerated a few sips of water without difficulty and one bite of applesauce. Given one piece of a graham cracker Pt was noted to take an extended amount of time to chew and pocketed small amounts in her left cheek. It should be noted that Pt has had a previous CVA with residual left sided weakness. Vocal quality remained clear, laryngeal elevation appeared adequate. Speech and language was appropriate without evident of Aphasia or Dysarthria but was not formally assessed today. Rec Dys 3 diet with chopped meats once diet can be initiated. ST to follow up with toleration. Prognosis good. SLP Visit Diagnosis: Dysphagia, oropharyngeal phase (R13.12)    Aspiration Risk  Mild  aspiration risk    Diet Recommendation Dysphagia 3 (Mech soft)   Medication Administration: Whole meds with liquid Supervision: Staff to assist with self feeding Compensations: Slow rate;Small sips/bites;Minimize environmental distractions;Lingual sweep for clearance of pocketing Postural Changes: Seated upright at 90 degrees;Remain upright for at least 30 minutes after po intake    Other  Recommendations  ST to follow up with toleration of diet    Follow up Recommendations        Frequency and Duration min 1 x/week  1 week       Prognosis Prognosis for Safe Diet Advancement: Good      Swallow Study   General Date of Onset: 04/09/20 HPI: Per admitting H & P "Stephanie Mason is an 69 y.o. female with PMH significant for 2 strokes in the past.  Her first stroke was several decades ago and occurred 6 months after starting birth control pills.  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  Patient states she is on 81 mg aspirin a day.  Patient specifically denies any history of hypertension, diabetes or dyslipidemia." Type of Study: Bedside Swallow Evaluation Diet Prior to this Study: NPO Temperature Spikes Noted: No Respiratory Status: Room air History of Recent Intubation: No Behavior/Cognition: Cooperative;Pleasant mood;Lethargic/Drowsy Oral Cavity Assessment: Within Functional Limits Oral Care Completed by SLP: No Oral Cavity - Dentition: Dentures, bottom;Dentures, top Vision: Functional for self-feeding Self-Feeding Abilities: Able to feed self;Needs assist Patient Positioning: Upright in bed Baseline Vocal Quality: Normal  Oral/Motor/Sensory Function Overall Oral Motor/Sensory Function: Within functional limits   Ice Chips Ice chips: Within functional limits Presentation: Spoon   Thin Liquid Thin Liquid: Within functional limits Presentation: Self Fed;Cup;Spoon;Straw    Nectar Thick Nectar Thick  Liquid: Not tested   Honey Thick Honey Thick Liquid: Not tested   Puree Puree: Within functional limits   Solid     Solid: Impaired Presentation: Self Fed Oral Phase Impairments: Reduced lingual movement/coordination;Poor awareness of bolus;Impaired mastication Oral Phase Functional Implications: Prolonged oral transit;Impaired mastication;Oral residue;Left lateral sulci pocketing      Eather Colas 04/10/2020,2:44 PM

## 2020-04-10 NOTE — Progress Notes (Addendum)
Chart reviewed, Pt apparently failed swallow screen therefore bedside swallow eval is appropriate at this time. Pt given limited amounts of Po to assess swallowing since she is scheduled for CT angiogram later today. No s/s of aspiration but noted oral phase dysphagia with oral residue after solids. Pt has an old CVA with left residual deficts. After CT is completed, rec Dys 3 diet with chopped meats. St to follow up with toleration of diet. No apprent speech or language deficits noted at this time.

## 2020-04-10 NOTE — H&P (Addendum)
History and PhysicalRubye Mason   KVQ:[id number] DOB: 10/12/1950 DOA: 04/09/2020  Referring MD/provider: Dr. Scotty Court. PCP: Patient, No Pcp Per   Patient coming from: Home  Chief Complaint: Headache, nausea and vomiting, slurred speech and blurry vision.  History of Present Illness:   Stephanie Mason is an 69 y.o. female with PMH significant for 2 strokes in the past.  Her first stroke was several decades ago and occurred 6 months after starting birth control pills.  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  Patient states she is on 81 mg aspirin a day.  Patient specifically denies any history of hypertension, diabetes or dyslipidemia.  Patient was in her usual state of health until yesterday when she developed onset of right periorbital headache.  Notes this is similar to her usual sinus headache however it was much more severe.  This was also associated with nausea and vomiting which is unusual for her.  Her boyfriend thought she was having a stroke because she had slurred speech and she admits that she had blurry vision in her right eye.  Patient was sent to ED by [facility] nurse to rule out stroke.    Patient denies fevers or chills.  No cough or shortness of breath.  No palpitations, dizziness, chest pain, orthopnea or PND.  Denies abdominal pain other than that associated with heaving from nausea and vomiting.    Of note, patient has a history of seizure disorder however her Keppra was discontinued by her PCP last year per patient.  ED Course:  The patient was seen yesterday and thought to have a sinus headache.  Headache was aborted with Reglan and IV fluids requested to go home.  CT showed right maxillary sinusitis consistent with her headache however also showed an area of hypodensity on the right posterior parietal lobe.  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  Patient felt that she was back to baseline and wanted to go home however her PCP Dr. Arta Silence convinced her to stay for further work-up.  ROS:   ROS   Review of Systems: General: Denies fever, chills, malaise,  Respiratory: Denies cough, SOB at rest or hemoptysis Cardiovascular: Denies chest pain or palpitations GU: Denies dysuria, frequency or hematuria   Past Medical History:   Past Medical History:  Diagnosis Date  . Diabetes mellitus without complication (HCC)   . Hypertension   . Stroke [facility])     Past Surgical History:   History reviewed. No pertinent surgical history.  Social History:   Social History   Socioeconomic History  . Marital status: Single    Spouse name: Not on file  . Number of children: Not on file  . Years of education: Not on file  . Highest education level: Not on file  Occupational History  . Not on file  Tobacco Use  . Smoking status: Never Smoker  . Smokeless tobacco: Never Used  Substance and Sexual Activity  . Alcohol use: Never  . Drug use: Never  . Sexual activity: Not on file  Other Topics Concern  . Not on file  Social History Narrative  . Not on file   Social Determinants of Health   Financial Resource Strain:   . Difficulty of Paying Living Expenses: Not on file  Food Insecurity:   . Worried About  Running Out of Food in the Last Year: Not on file  . Ran Out of Food in the Last Year: Not on file  Transportation Needs:   . Lack of Transportation (Medical): Not on file  . Lack of Transportation (Non-Medical): Not on file  Physical Activity:   . Days of Exercise per Week: Not on file  . Minutes of Exercise per Session: Not on file  Stress:   . Feeling of Stress : Not on file  Social Connections:   . Frequency of Communication with Friends and Family: Not on file  . Frequency of Social Gatherings with Friends and Family: Not on file  . Attends Religious Services: Not on file  . Active  Member of Clubs or Organizations: Not on file  . Attends Banker Meetings: Not on file  . Marital Status: Not on file  Intimate Partner Violence:   . Fear of Current or Ex-Partner: Not on file  . Emotionally Abused: Not on file  . Physically Abused: Not on file  . Sexually Abused: Not on file    Allergies   Patient has no known allergies.  Family history:   No family history on file.  Current Medications:   Prior to Admission medications   Medication Sig Start Date End Date Taking? Authorizing Provider  fluticasone (FLONASE) 50 MCG/ACT nasal spray Place 1 spray into both nostrils daily for 7 days. 04/09/20 04/16/20  Dionne Bucy, MD  metoCLOPramide (REGLAN) 10 MG tablet Take 1 tablet (10 mg total) by mouth every 8 (eight) hours as needed for up to 5 days for nausea (headache). 04/09/20 04/14/20  Dionne Bucy, MD    Physical Exam:   Vitals:   04/09/20 2133 04/10/20 0203 04/10/20 0643 04/10/20 0945  BP:  (!) 156/85 (!) 156/85   Pulse: 75 74 80 82  Resp: 18 (!) 28 (!) 25 (!) 27  Temp:      TempSrc:      SpO2: 99% 99% 99% 98%  Weight:      Height:         Physical Exam: Blood pressure (!) 156/85, pulse 82, temperature 99.1 F (37.3 C), temperature source Oral, resp. rate (!) 27, height 5\' 7"  (1.702 m), weight 66.7 kg, SpO2 98 %. Gen: Sleepy appearing female lying in bed in no acute distress.  She is able to speak in full sentences without difficulty.  No slurred speech noted. Eyes: sclera anicteric, conjuctiva mildly injected bilaterally CVS: S1-S2, regulary, no gallops Respiratory: CTA B  GI: NABS, soft, NT  LE: No edema. No cyanosis Neuro: A/O x 3.  Patient does not have slurred speech.  She has a brace on her left leg and intermittent tremors of her left leg which she states are old.  Upper extremities have normal and equal strength. Psych: patient is logical and coherent, mood and affect appropriate to situation.   Data Review:     Labs: Basic Metabolic Panel: Recent Labs  Lab 04/09/20 2131  NA 139  K 4.3  CL 102  CO2 28  GLUCOSE 148*  BUN 10  CREATININE 0.88  CALCIUM 9.6   Liver Function Tests: No results for input(s): AST, ALT, ALKPHOS, BILITOT, PROT, ALBUMIN in the last 168 hours. No results for input(s): LIPASE, AMYLASE in the last 168 hours. No results for input(s): AMMONIA in the last 168 hours. CBC: Recent Labs  Lab 04/09/20 2131  WBC 8.3  NEUTROABS 7.1  HGB 15.8*  HCT 48.6*  MCV 107.5*  PLT  213   Cardiac Enzymes: No results for input(s): CKTOTAL, CKMB, CKMBINDEX, TROPONINI in the last 168 hours.  BNP (last 3 results) No results for input(s): PROBNP in the last 8760 hours. CBG: No results for input(s): GLUCAP in the last 168 hours.  Urinalysis No results found for: COLORURINE, APPEARANCEUR, LABSPEC, PHURINE, GLUCOSEU, HGBUR, BILIRUBINUR, KETONESUR, PROTEINUR, UROBILINOGEN, NITRITE, LEUKOCYTESUR    Radiographic Studies: CT Head Wo Contrast  Result Date: 04/09/2020 CLINICAL DATA:  Headache, prior stroke EXAM: CT HEAD WITHOUT CONTRAST TECHNIQUE: Contiguous axial images were obtained from the base of the skull through the vertex without intravenous contrast. COMPARISON:  None. FINDINGS: Brain: Large area of encephalomalacia involving the right frontal lobe. There is hypodensity involving the posterior right parietal lobe. No large extra-axial collections. No midline shift. There is ex vacuo dilatation of the right anterior horn of the lateral ventricle. Vascular: No hyperdense vessel or unexpected calcification. Skull: The skull is intact. No fracture or focal lesion identified. Sinuses/Orbits: The visualized paranasal sinuses and mastoid air cells are clear. The orbits and globes intact. Other: None Osseous: No acute fracture or other significant osseous abnormality.The nasal bone, mandibles, zygomatic arches and pterygoid plates are intact. Orbits: No fracture identified. Unremarkable  appearance of globes and orbits. Sinuses: There is near complete opacification of the right maxillary sinus. A small amount of mucosal thickening seen within the left maxillary sinus. Soft tissues:  No acute findings. Limited intracranial: No acute findings. IMPRESSION: Large area of encephalomalacia involving the right MCA territory with hypodensity involving the right posterior parietal lobe likely chronic or subacute infarct. If clinical concern remains would recommend MRI for further evaluation. Findings suggestive of right maxillary sinusitis. Electronically Signed   By: Jonna ClarkBindu  Avutu M.D.   On: 04/09/2020 22:10   MR BRAIN WO CONTRAST  Result Date: 04/10/2020 CLINICAL DATA:  Stroke follow-up. History of stroke with new headache. EXAM: MRI HEAD WITHOUT CONTRAST TECHNIQUE: Multiplanar, multiecho pulse sequences of the brain and surrounding structures were obtained without intravenous contrast. COMPARISON:  Head CT from yesterday FINDINGS: Brain: Large remote right cerebral infarct affecting the ACA territory and upper division MCA territory with dense encephalomalacia. Restricted diffusion is seen in the remaining right cerebral cortex, also involving white matter. This is attributed to infarct rather than seizure phenomenon given the this spans both the MCA and PCA distributions but is still most likely acute infarct. The right PCA territory appeared spared on prior head CT but is involved on this study. Wallerian degeneration in the right cortical spinal tracts. No acute infarct in the left hemisphere or posterior fossa. No mass, hydrocephalus, or acute hemorrhage. Vascular: No flow void seen in the right ICA until the supraclinoid segment. Skull and upper cervical spine: Normal marrow signal Sinuses/Orbits: Bilateral maxillary sinus mucosal thickening with fluid/secretions on the right. Maxillofacial CT was performed yesterday. IMPRESSION: 1. Acute inferior division right MCA territory infarct. Acute right  PCA territory infarct (which occurred even more recently based on the prior head CT). 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 2. Pre-existing right ACA and upper division MCA territory infarcts. 3. 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 Electronically Signed   By: Marnee SpringJonathon  Watts M.D.   On: 04/10/2020 08:13   CT Maxillofacial Wo Contrast  Result Date: 04/09/2020 CLINICAL DATA:  Headache, prior stroke EXAM: CT HEAD WITHOUT CONTRAST TECHNIQUE: Contiguous axial images were obtained from the base of the skull through the vertex without intravenous contrast. COMPARISON:  None. FINDINGS:  Brain: Large area of encephalomalacia involving the right frontal lobe. There is hypodensity involving the posterior right parietal lobe. No large extra-axial collections. No midline shift. There is ex vacuo dilatation of the right anterior horn of the lateral ventricle. Vascular: No hyperdense vessel or unexpected calcification. Skull: The skull is intact. No fracture or focal lesion identified. Sinuses/Orbits: The visualized paranasal sinuses and mastoid air cells are clear. The orbits and globes intact. Other: None Osseous: No acute fracture or other significant osseous abnormality.The nasal bone, mandibles, zygomatic arches and pterygoid plates are intact. Orbits: No fracture identified. Unremarkable appearance of globes and orbits. Sinuses: There is near complete opacification of the right maxillary sinus. A small amount of mucosal thickening seen within the left maxillary sinus. Soft tissues:  No acute findings. Limited intracranial: No acute findings. IMPRESSION: Large area of encephalomalacia involving the right MCA territory with hypodensity involving the right posterior parietal lobe likely chronic or subacute infarct. If clinical concern remains would recommend MRI for further evaluation. Findings suggestive of right maxillary sinusitis. Electronically Signed   By: Jonna Clark  M.D.   On: 04/09/2020 22:10    EKG: Independently reviewed.  NSR at 70.  Normal intervals.  Normal axis.  No acute ST-T wave changes.  Normal EKG.   Assessment/Plan:   Active Problems:   Acute CVA (cerebrovascular accident) (HCC)  69 year old female with no significant risk factors and unusual stroke history now presents with third stroke in 25 years.   CVA Etiology of patient's strokes is unclear given unusual stroke history. Patient's for stroke was after starting oral contraceptive pills, unclear if hypercoagulable work-up was completed. 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 Further work-up possibly for rheumatologic disease per neurology. Will continue patient on aspirin 81 mg pending further recommendations from neurology Permissive hypertension.   Routine glucose 148, no history of DM, hemoglobin A1c ordered. Echo, carotid Dopplers and brain MRA are ordered.  Brain MRI already done. 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 The right ICA becomes occluded at the cavernous segment and remains occluded through the supraclinoid segment. There is complete non opacification of the right middle cerebral arteries. Additionally, there is non opacification of the A1 right anterior cerebral artery. The A2 and more distal right anterior cerebral artery is markedly diminutive with multifocal high-grade stenoses. There is a fetal origin right posterior cerebral artery with occluded right posterior communicating artery. There is reconstitution of flow within the P2 and more distal right PCA.  Macrocytosis Patient was followed by Dr. Donneta Romberg, he attributed this to "liver disease". His note is available under MRN  #[id number]--[id number] are supposed to be merged. Further work-up there is an inpatient or outpatient as warranted.  History of seizure disorder Patient has been taken off her Keppra last year, no seizures since.  Left leg neuropathy/paresis Continue gabapentin 600 in a.m., 300 and afternoon per patient report   Other information:   DVT prophylaxis: Lovenox ordered. Code Status: Full Family Communication: Patient states her boyfriend knows that she is here Disposition Plan: Home Consults called: Neurology Admission status: Observation  Josiah Nieto Tublu Gary Bultman [facility]  If 7PM-7AM, please contact night-coverage [email or web address] Password [email or web address] 04/10/2020, 10:25 AM

## 2020-04-10 NOTE — ED Notes (Signed)
Pt updated on delay for MRI.

## 2020-04-10 NOTE — Progress Notes (Signed)
*  PRELIMINARY RESULTS* Echocardiogram 2D Echocardiogram has been performed.  Stephanie Mason 04/10/2020, 1:23 PM

## 2020-04-10 NOTE — Progress Notes (Signed)
VAST consulted to obtain 20g IV access for Angio head/neck. Pt with flaccid left arm from previous stroke. Assessed pt's right arm and determined to place an appropriate line for CT and hospitalization, a midline was necessary. Pt with extremely limited vasculature and line placement was difficult, but successful. Notified pt's nurse of IV access and appropriate precautions.

## 2020-04-10 NOTE — Progress Notes (Signed)
VAST consult received because midline which was placed about an hour ago won't flush or draw back.  Upon arrival at bedside, midline would not flush or draw back blood. Took down dressing and assessed line for kinks and redressed while pulling back for blood and flushing. Able to redress line while maintaining good blood return and easy flushing. No securement device available for use beneath dressing.  Advised unit RN of results; called CT staff while IVT at bedside. CT staff came and assessed line with good blood return and easy flushing.

## 2020-04-10 NOTE — ED Notes (Signed)
Pt taken to CT.

## 2020-04-10 NOTE — Progress Notes (Signed)
Chart reviewed. Staff entering room for echocardiogram. Will reattempt later.

## 2020-04-11 ENCOUNTER — Inpatient Hospital Stay: Payer: Medicare (Managed Care)

## 2020-04-11 LAB — LIPID PANEL
Cholesterol: 150 mg/dL (ref 0–200)
HDL: 60 mg/dL (ref >40)
LDL Cholesterol: 80 mg/dL (ref 0–99)
Total CHOL/HDL Ratio: 2.5 RATIO
Triglycerides: 50 mg/dL (ref <150)
VLDL: 10 mg/dL (ref 0–40)

## 2020-04-11 MED ORDER — ACETAMINOPHEN 500 MG PO TABS: 1000.0000 mg | ORAL_TABLET | Freq: Once | ORAL | Status: AC

## 2020-04-11 NOTE — Hospital Course (Signed)
Stephanie Mason is an 69 y.o. 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  She presented to the ED on evening of 04/09/20 after developing a severe R periorbital headache the day before, associated nausea, vomiting, slurred speech and blurry vision in R eye.  She is followed by [facility] program whose nurse sent her to the ED.     ED Course:  Afebrile, hypertensive 156/85, tachypnea, HR 74.   BMP unremarkable other than glucose 148.  CBC showed hemoconcentration with Hbg 15.8 and macrocytosis MCV 107.5, no leukocytosis.    CT head and maxillofacial - Large area of encephalomalacia involving the right MCA territory with hypodensity involving the right posterior parietal lobe likely chronic or subacute infarct. If clinical concern remains would recommend MRI for further evaluation.  Findings suggestive of right maxillary sinusitis.  MRI brain - 1. Acute inferior division right MCA territory infarct. Acute right PCA territory infarct (which occurred even more recently based on the prior head CT). 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 2. Pre-existing right ACA and upper division MCA territory infarcts. 3. 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

## 2020-04-11 NOTE — Progress Notes (Signed)
Occupational Therapy Treatment Patient Details Name: Stephanie Mason MRN: [id number] DOB: 31-Jul-1951 Today's Date: 04/11/2020    History of present illness Stephanie Mason is a 69 y.o. 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 Patient was in her usual state of health until yesterday when she developed onset of right periorbital headache, slurred speech, and blurry vision in her right eye. 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   OT comments  Pt seated in recliner chair and agreeable to OT intervention. Pt describes level of self care involvement at home and reports she does not ambulate (only transfers). OT set up [facility] based on pt's home description. Pt reports she does not use RW for transfer and therefore she stood with max A from recliner chair with L knee blocked and performed stand pivot transfer to standard BSC. Pt did not need to void this session. Pt returned back to recliner chair in same manner as above going in both directions this session. Pt reports feeling close to baseline. Set up A for grooming tasks while seated in recliner chair. Based on pt's report and performance recommendation changed to home with prior level services/assist with HHOT to address decreased strength and endurance, related to self care tasks, from most recent hospitalization. Pt is in agreement.    Follow Up Recommendations  Home health OT;Other (comment) ([facility] program)    Equipment Recommendations  Other (comment) (has all needed equipment)       Precautions / Restrictions Precautions Precautions: Fall Restrictions Weight Bearing Restrictions: No       Mobility Bed Mobility    General bed mobility comments: pt seated in recliner chair upon entering the room  Transfers Overall transfer level: Needs  assistance Equipment used: None Transfers: Sit to/from Stand;Stand Pivot Transfers Sit to Stand: Max assist Stand pivot transfers: Max assist       General transfer comment: L knee blocked for stand and transfer. Min cuing for technique    Balance Overall balance assessment: Needs assistance Sitting-balance support: Feet unsupported;Single extremity supported Sitting balance-Leahy Scale: Fair Sitting balance - Comments: seated on BSC with supervision   Standing balance support: Single extremity supported;During functional activity Standing balance-Leahy Scale: Zero Standing balance comment: reliance on therapist assistance with L knee blocked       ADL either performed or assessed with clinical judgement   ADL Overall ADL's : Needs assistance/impaired    Toilet Transfer: Maximal assistance;BSC   Toileting- Clothing Manipulation and Hygiene: Maximal assistance;Total assistance                         Cognition Arousal/Alertness: Awake/alert Behavior During Therapy: WFL for tasks assessed/performed Overall Cognitive Status: Within Functional Limits for tasks assessed                             Pertinent Vitals/ Pain       Pain Assessment: No/denies pain      Frequency  Min 2X/week        Progress Toward Goals  OT Goals(current goals can now be found in the care plan section)  Progress towards OT goals: Progressing toward goals  Acute Rehab OT Goals Patient Stated Goal: To return home OT Goal Formulation: With patient Time For Goal Achievement: 04/24/20 Potential  to Achieve Goals: Good  Plan Discharge plan needs to be updated       AM-PAC OT "6 Clicks" Daily Activity     Outcome Measure   Help from another person eating meals?: A Little Help from another person taking care of personal grooming?: A Little Help from another person toileting, which includes using toliet, bedpan, or urinal?: A Lot Help from another person bathing  (including washing, rinsing, drying)?: A Lot Help from another person to put on and taking off regular upper body clothing?: A Lot Help from another person to put on and taking off regular lower body clothing?: A Lot 6 Click Score: 14    End of Session Equipment Utilized During Treatment: Other (comment) (L AFO)  OT Visit Diagnosis: Other abnormalities of gait and mobility (R26.89);Muscle weakness (generalized) (M62.81);Hemiplegia and hemiparesis Hemiplegia - Right/Left: Left Hemiplegia - dominant/non-dominant: Non-Dominant Hemiplegia - caused by: Cerebral infarction   Activity Tolerance Patient tolerated treatment well   Patient Left in chair;with call bell/phone within reach;with chair alarm set   Nurse Communication Mobility status;Precautions        Time: 5638-9373 OT Time Calculation (min): 24 min  Charges: OT General Charges $OT Visit: 1 Visit OT Treatments $Self Care/Home Management : 23-37 mins  Jackquline Denmark, MS, OTR/L , CBIS ascom [phone]  04/11/20, 10:58 AM

## 2020-04-11 NOTE — Progress Notes (Signed)
Reason for consult:   Subjective: Patient is drowsy this morning but easily arousable and answers questions and follows commands.  Gaze deviation is more prominent, able to cross midline.   ROS: negative except above   Examination  Vital signs in last 24 hours: Temp:  [98 F (36.7 C)-99.5 F (37.5 C)] 98.1 F (36.7 C) (08/31 1130) Pulse Rate:  [76-93] 91 (08/31 1130) Resp:  [16-37] 18 (08/31 1130) BP: (119-161)/(60-80) 146/70 (08/31 1130) SpO2:  [92 %-100 %] 100 % (08/31 1130) Weight:  [73.4 kg] 73.4 kg (08/31 0427)  General: lying in bed CVS: pulse-normal rate and rhythm RS: breathing comfortably Extremities: normal   Neuro: MS: Somnolent but arousable, oriented, follows commands CN: pupils equal and reactive, left homonymous hemianopsia, right gaze deviation, left facial droop, tongue midline, normal sensation over face, Motor: 5/5 strength in right upper and lower extremity, left upper extremity 1/5 with spasticity, left lower extremity 2/5 -spastic with spontaneous clonus Coordination: normal on right side   Basic Metabolic Panel: Recent Labs  Lab 04/09/20 2131  NA 139  K 4.3  CL 102  CO2 28  GLUCOSE 148*  BUN 10  CREATININE 0.88  CALCIUM 9.6    CBC: Recent Labs  Lab 04/09/20 2131  WBC 8.3  NEUTROABS 7.1  HGB 15.8*  HCT 48.6*  MCV 107.5*  PLT 213     Coagulation Studies: No results for input(s): LABPROT, INR in the last 72 hours.  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 The right ICA becomes occluded at the cavernous segment and remains occluded through the supraclinoid segment. There is complete non opacification of the right middle cerebral arteries There is a fetalorigin right posterior cerebral artery with occluded right posterior communicating artery. There is reconstitution of flow within the P2 and more distal right PCA.   CTA neck Soft plaque within the right carotid  bifurcation/ICA bulb without hemodynamically significant stenosis. However, distal to this, the right cervical ICA is markedly asymmetrically diminutive.  ASSESSMENT AND PLAN  69 y.o. 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  MRI brain confirms large right parietal and occipital lobe infarctions consistent with inferior division right MCA and PCA terrioty infarction-likely due to right PCA territory supplied by large posterior communicating artery.  Infarction is fairly large, however due to atrophy and area right encephalomalacia there appears to be space for expansison and hence patient is low risk for developing malignant cerebral edema.  Etiology of stroke to be determined: Suspect right carotid disease vs embolic vs hypercoagulable state.     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    Cytotoxic cerebral edema -Low risk for developing malignant cerebral edema due to prior large right MCA stroke resulting in atrophy -Avoid hypotonic fluids -Frequent neurochecks -Repeat CT head ordered to check for midline shift  Karena Addison Ozzie Remmers [facility] Pager Number [phone] For questions after 7pm please refer to AMION to reach the Neurologist on call

## 2020-04-11 NOTE — Progress Notes (Signed)
PROGRESS NOTE    Stephanie Mason   ZOX:[id number]  DOB: 13-Sep-1950  PCP: Patient, No Pcp Per    DOA: 04/09/2020 LOS: 1   Brief Narrative   Stephanie Mason is an 69 y.o. 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  She presented to the ED on evening of 04/09/20 after developing a severe R periorbital headache the day before, associated nausea, vomiting, slurred speech and blurry vision in R eye.  [facility] whose nurse sent her to the ED.     ED Course:  Afebrile, hypertensive 156/85, tachypnea, HR 74.   BMP unremarkable other than glucose 148.  CBC showed hemoconcentration with Hbg 15.8 and macrocytosis MCV 107.5, no leukocytosis.    CT head and maxillofacial - Large area of encephalomalacia involving the right MCA territory with hypodensity involving the right posterior parietal lobe likely chronic or subacute infarct. If clinical concern remains would recommend MRI for further evaluation.  Findings suggestive of right maxillary sinusitis.  MRI brain - 1. Acute inferior division right MCA territory infarct. Acute right PCA territory infarct (which occurred even more recently based on the prior head CT). 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 2. Pre-existing right ACA and upper division MCA territory infarcts. 3. 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        Assessment & Plan   Active Problems:   Acute CVA (cerebrovascular accident) (HCC)   Stroke (cerebrum) (HCC)   Acute/subacute MCA and PCA stroke - etiology is to be determined.  She has history of two prior strokes.   Patient's for stroke was after starting oral contraceptive pills, unclear if hypercoagulable work-up was completed. --f/u Echo --continue ASA 80 mg PO daily, Lipitor 40  mg PO daily  --Permissive HTN - BP goal 185/110 for now --Telemetry --Neurochecks  Cytotoxin Cerebral Edema - per neurology and radiology (see H&P A/P for details), low risk for malignant cerebral edema or herniation.  Avoid hypotonic fluids.  Neuro checks as above.  Repeat CT head to eval for midline shift.  Macrocytosis - chronic.  Patient was followed by Dr. Donneta Romberg, he attributed this to "liver disease", pt denies hx of this however.  His note is available under MRN #[id number] are supposed to be merged. Further work-up there is an inpatient or outpatient as warranted.  History of seizure disorder - taken off her Keppra last year, no seizures since.  Left leg neuropathy/paresis - Continue gabapentin 600 qAM, 300 in afternoon   DVT prophylaxis: enoxaparin (LOVENOX) injection 40 mg Start: 04/10/20 1100   Diet:  Diet Orders (From admission, onward)    Start     Ordered   04/11/20 0732  DIET DYS 3 Room service appropriate? Yes; Fluid consistency: Thin  Diet effective now       Question Answer Comment  Room service appropriate? Yes   Fluid consistency: Thin      04/11/20 0732            Code Status: Full Code    Subjective 04/11/20    Patient seen up in chair this AM.  She reports feeling well.  Denies any worsening neurologic symptoms, headache, vision changes.  Says she lives with her boyfriend, uses a walker "sometimes".  Says she felt  at her baseline when working with PT and OT earlier.   Disposition Plan & Communication   Status is: Inpatient  Remains inpatient appropriate because:Ongoing diagnostic testing needed not appropriate for outpatient work up.  Requires close monitoring of neurologic status given large stroke.  D/C pending clearance by neurology.   Dispo: The patient is from: Home              Anticipated d/c is to: Home              Anticipated d/c date is: 1-2 days              Patient currently is not medically stable to d/c.   Family  Communication: none at bedside, will attempt to call    Consults, Procedures, Significant Events   Consultants:   Neurology  Procedures:   Imaging as below  Antimicrobials:   None   Objective   Vitals:   04/10/20 2342 04/11/20 0346 04/11/20 0427 04/11/20 0744  BP: (!) 161/72 (!) 151/80 (!) 142/60 119/69  Pulse: 92 89 85 93  Resp: Temp: 98.7 F (37.1 C) 98 F (36.7 C) 99.5 F (37.5 C) 98.5 F (36.9 C)  TempSrc: Oral  Oral   SpO2: 97% 98% 100% 98%  Weight: 73.4 kg  73.4 kg   Height:  (1.702 m)       Intake/Output Summary (Last 24 hours) at 04/11/2020 0828 Last data filed at 04/11/2020 0000 Gross per 24 hour  Intake --  Output 100 ml  Net -100 ml   Filed Weights   04/10/20 1058 04/10/20 2342 04/11/20 0427  Weight: 66.7 kg 73.4 kg 73.4 kg    Physical Exam:  General exam: awake but drowsy appearing, no acute distress Respiratory system: CTAB, no wheezes, rales or rhonchi, normal respiratory effort. Cardiovascular system: normal S1/S2, RRR, no pedal edema.   Gastrointestinal system: soft, NT, ND Central nervous system: A&O x3. normal speech, right gaze deviation, left facial droop, R side motor 5/5, LUE 2/5, LLE 1/5 Psychiatry: normal mood, congruent affect, judgement and insight appear normal  Labs   Data Reviewed: I have personally reviewed following labs and imaging studies  CBC: Recent Labs  Lab 04/09/20 2131  WBC 8.3  NEUTROABS 7.1  HGB 15.8*  HCT 48.6*  MCV 107.5*  PLT 213   Basic Metabolic Panel: Recent Labs  Lab 04/09/20 2131  NA 139  K 4.3  CL 102  CO2 28  GLUCOSE 148*  BUN 10  CREATININE 0.88  CALCIUM 9.6   GFR: Estimated Creatinine Clearance: 61.2 mL/min (by C-G formula based on SCr of 0.88 mg/dL). Liver Function Tests: No results for input(s): AST, ALT, ALKPHOS, BILITOT, PROT, ALBUMIN in the last 168 hours. No results for input(s): LIPASE, AMYLASE in the last 168 hours. No results for input(s): AMMONIA in  the last 168 hours. Coagulation Profile: No results for input(s): INR, PROTIME in the last 168 hours. Cardiac Enzymes: No results for input(s): CKTOTAL, CKMB, CKMBINDEX, TROPONINI in the last 168 hours. BNP (last 3 results) No results for input(s): PROBNP in the last 8760 hours. HbA1C: No results for input(s): HGBA1C in the last 72 hours. CBG: No results for input(s): GLUCAP in the last 168 hours. Lipid Profile: No results for input(s): CHOL, HDL, LDLCALC, TRIG, CHOLHDL, LDLDIRECT in the last 72 hours. Thyroid Function Tests: No results for input(s): TSH, T4TOTAL, FREET4, T3FREE, THYROIDAB in the last 72 hours. Anemia Panel: No results for input(s): VITAMINB12,  FOLATE, FERRITIN, TIBC, IRON, RETICCTPCT in the last 72 hours. Sepsis Labs: No results for input(s): PROCALCITON, LATICACIDVEN in the last 168 hours.  Recent Results (from the past 240 hour(s))  SARS Coronavirus 2 by RT PCR (hospital order, performed in [facility] hospital lab) Nasopharyngeal Nasopharyngeal Swab     Status: None   Collection Time: 04/10/20 10:58 AM   Specimen: Nasopharyngeal Swab  Result Value Ref Range Status   SARS Coronavirus 2 NEGATIVE NEGATIVE Final    Comment: (NOTE) SARS-CoV-2 target nucleic acids are NOT DETECTED.  The SARS-CoV-2 RNA is generally detectable in upper and lower respiratory specimens during the acute phase of infection. The lowest concentration of SARS-CoV-2 viral copies this assay can detect is 250 copies / mL. A negative result does not preclude SARS-CoV-2 infection and should not be used as the sole basis for treatment or other patient management decisions.  A negative result may occur with improper specimen collection / handling, submission of specimen other than nasopharyngeal swab, presence of viral mutation(s) within the areas targeted by this assay, and inadequate number of viral copies (<250 copies / mL). A negative result must be combined with clinical observations,  patient history, and epidemiological information.  Fact Sheet for Patients:   BoilerBrush.com.cy  Fact Sheet for Healthcare Providers: https://pope.com/  This test is not yet approved or  cleared by the [place] FDA and has been authorized for detection and/or diagnosis of SARS-CoV-2 by FDA under an Emergency Use Authorization (EUA).  This EUA will remain in effect (meaning this test can be used) for the duration of the COVID-19 declaration under Section 564(b)(1) of the Act, 21 U.S.C. section 360bbb-3(b)(1), unless the authorization is terminated or revoked sooner.  Performed at [facility], [address] [place], [place] [address]       Imaging Studies   CT ANGIO HEAD W OR WO CONTRAST  Addendum Date: 04/10/2020   ADDENDUM REPORT: 04/10/2020 18:09 ADDENDUM: These results were called by telephone at the time of interpretation on 04/10/2020 at 6:09 pm to provider Dr. Arthur Holms , who verbally acknowledged these results. Electronically Signed   By: Jackey Loge DO   On: 04/10/2020 18:09   Result Date: 04/10/2020 CLINICAL DATA:  Neuro deficit, acute stroke suspected. EXAM: CT ANGIOGRAPHY HEAD AND NECK TECHNIQUE: Multidetector CT imaging of the head and neck was performed using the standard protocol during bolus administration of intravenous contrast. Multiplanar CT image reconstructions and MIPs were obtained to evaluate the vascular anatomy. Carotid stenosis measurements (when applicable) are obtained utilizing NASCET criteria, using the distal internal carotid diameter as the denominator. CONTRAST:  24mL OMNIPAQUE IOHEXOL 350 MG/ML SOLN COMPARISON:  Brain MRI 04/10/2020, head CT 04/09/2020, MRA head 09/15/2012. FINDINGS: CT HEAD FINDINGS Brain: Again demonstrated is extensive acute on chronic infarcts within the right cerebral hemisphere with acute infarcts affecting the right MCA and PCA vascular territories. Similar  degree of mass effect. Associated ex vacuo dilatation of the frontal horn of the right lateral ventricle. No midline shift. No evidence of hemorrhagic conversion. No extra-axial fluid collection. No evidence of intracranial mass. Vascular: Reported below. Skull: Normal. Negative for fracture or focal lesion. Sinuses: Extensive partial opacification of the right maxillary sinus. No significant mastoid effusion. Orbits: No acute abnormality. Review of the MIP images confirms the above findings CTA NECK FINDINGS Aortic arch: Common origin of the innominate and left common carotid arteries. The visualized aortic arch is unremarkable. No hemodynamically significant innominate or proximal subclavian artery stenosis. Right carotid  system: The CCA is patent to the bifurcation without significant stenosis. Soft tissue plaque within the ICA bulb without hemodynamically significant stenosis (50% or greater). Distal to this, there is a markedly diminutive appearance of the cervical ICA. The intracranial left ICA becomes occluded at the cavernous segment and remains occluded to the terminus. Left carotid system: CCA and ICA patent within the neck without hemodynamically significant stenosis (50% or greater). Mild soft plaque within the carotid bifurcation and proximal ICA. Vertebral arteries: The vertebral arteries are patent within the neck bilaterally without hemodynamically significant stenosis. The left vertebral artery is dominant. Skeleton: No acute bony abnormality or aggressive osseous lesion. Other neck: No neck mass or cervical lymphadenopathy. Upper chest: No consolidation within the imaged lung apices. Review of the MIP images confirms the above findings CTA HEAD FINDINGS Anterior circulation: The pre cavernous right ICA is severely stenotic. There is occlusion of the right ICA beginning at the cavernous segment and this vessel remains occluded through the ICA terminus. There is non opacification of the right middle  cerebral artery vessels. Additionally, there is non opacification of the A1 right anterior cerebral artery. The A2 and more distal right anterior cerebral artery is markedly diminutive with multifocal high-grade stenoses throughout. The intracranial left ICA is patent without significant stenosis. The M1 left middle cerebral artery is patent without significant stenosis. No left M2 proximal branch occlusion or high-grade proximal stenosis is identified. The left anterior cerebral artery is patent without high-grade proximal stenosis. There is mild fusiform dilation of the supraclinoid left ICA and left ICA terminus which may reflect an aneurysm. Posterior circulation: The non dominant intracranial right vertebral artery is developmentally diminutive, but patent and terminates as the right PICA. The dominant intracranial left vertebral artery is patent without significant stenosis, as is the basilar artery. The right posterior cerebral artery appears predominantly fetal in origin and the right posterior communicating artery appears occluded. There is flow within a hypoplastic right P1 segment and within the P2 and more distal right posterior cerebral artery. Fetal origin left posterior cerebral artery which is patent without significant proximal stenosis. Venous sinuses: Within limitations of contrast timing, no convincing thrombus. Anatomic variants: None significant Review of the MIP images confirms the above findings IMPRESSION: CT head: Redemonstrated extensive acute on chronic infarcts within the right cerebral hemisphere with acute infarcts affecting the right MCA and PCA vascular territories. No evidence of hemorrhagic conversion. Similar degree of mass effect. No midline shift. CTA neck: 1. There is soft plaque within the right carotid bifurcation/ICA bulb without hemodynamically significant stenosis. However, distal to this, the right cervical ICA is markedly asymmetrically diminutive. 2. The left common  carotid artery, cervical internal carotid artery and bilateral vertebral arteries are patent within the neck without hemodynamically significant stenosis. CTA head: 1. 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 The right ICA becomes occluded at the cavernous segment and remains occluded through the supraclinoid segment. There is complete non opacification of the right middle cerebral arteries. Additionally, there is non opacification of the A1 right anterior cerebral artery. The A2 and more distal right anterior cerebral artery is markedly diminutive with multifocal high-grade stenoses. There is a fetal origin right posterior cerebral artery with occluded right posterior communicating artery. There is reconstitution of flow within the P2 and more distal right PCA. 2. 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 Electronically Signed: By: Jackey Loge DO On: 04/10/2020 17:51   CT Head Wo  Contrast  Result Date: 04/09/2020 CLINICAL DATA:  Headache, prior stroke EXAM: CT HEAD WITHOUT CONTRAST TECHNIQUE: Contiguous axial images were obtained from the base of the skull through the vertex without intravenous contrast. COMPARISON:  None. FINDINGS: Brain: Large area of encephalomalacia involving the right frontal lobe. There is hypodensity involving the posterior right parietal lobe. No large extra-axial collections. No midline shift. There is ex vacuo dilatation of the right anterior horn of the lateral ventricle. Vascular: No hyperdense vessel or unexpected calcification. Skull: The skull is intact. No fracture or focal lesion identified. Sinuses/Orbits: The visualized paranasal sinuses and mastoid air cells are clear. The orbits and globes intact. Other: None Osseous: No acute fracture or other significant osseous abnormality.The nasal bone, mandibles, zygomatic arches and pterygoid plates are intact. Orbits: No fracture identified. Unremarkable appearance  of globes and orbits. Sinuses: There is near complete opacification of the right maxillary sinus. A small amount of mucosal thickening seen within the left maxillary sinus. Soft tissues:  No acute findings. Limited intracranial: No acute findings. IMPRESSION: Large area of encephalomalacia involving the right MCA territory with hypodensity involving the right posterior parietal lobe likely chronic or subacute infarct. If clinical concern remains would recommend MRI for further evaluation. Findings suggestive of right maxillary sinusitis. Electronically Signed   By: Jonna Clark M.D.   On: 04/09/2020 22:10   CT ANGIO NECK W OR WO CONTRAST  Addendum Date: 04/10/2020   ADDENDUM REPORT: 04/10/2020 18:09 ADDENDUM: These results were called by telephone at the time of interpretation on 04/10/2020 at 6:09 pm to provider Dr. Arthur Holms , who verbally acknowledged these results. Electronically Signed   By: Jackey Loge DO   On: 04/10/2020 18:09   Result Date: 04/10/2020 CLINICAL DATA:  Neuro deficit, acute stroke suspected. EXAM: CT ANGIOGRAPHY HEAD AND NECK TECHNIQUE: Multidetector CT imaging of the head and neck was performed using the standard protocol during bolus administration of intravenous contrast. Multiplanar CT image reconstructions and MIPs were obtained to evaluate the vascular anatomy. Carotid stenosis measurements (when applicable) are obtained utilizing NASCET criteria, using the distal internal carotid diameter as the denominator. CONTRAST:  75mL OMNIPAQUE IOHEXOL 350 MG/ML SOLN COMPARISON:  Brain MRI 04/10/2020, head CT 04/09/2020, MRA head 09/15/2012. FINDINGS: CT HEAD FINDINGS Brain: Again demonstrated is extensive acute on chronic infarcts within the right cerebral hemisphere with acute infarcts affecting the right MCA and PCA vascular territories. Similar degree of mass effect. Associated ex vacuo dilatation of the frontal horn of the right lateral ventricle. No midline shift. No evidence of  hemorrhagic conversion. No extra-axial fluid collection. No evidence of intracranial mass. Vascular: Reported below. Skull: Normal. Negative for fracture or focal lesion. Sinuses: Extensive partial opacification of the right maxillary sinus. No significant mastoid effusion. Orbits: No acute abnormality. Review of the MIP images confirms the above findings CTA NECK FINDINGS Aortic arch: Common origin of the innominate and left common carotid arteries. The visualized aortic arch is unremarkable. No hemodynamically significant innominate or proximal subclavian artery stenosis. Right carotid system: The CCA is patent to the bifurcation without significant stenosis. Soft tissue plaque within the ICA bulb without hemodynamically significant stenosis (50% or greater). Distal to this, there is a markedly diminutive appearance of the cervical ICA. The intracranial left ICA becomes occluded at the cavernous segment and remains occluded to the terminus. Left carotid system: CCA and ICA patent within the neck without hemodynamically significant stenosis (50% or greater). Mild soft plaque within the carotid bifurcation and proximal ICA. Vertebral arteries:  The vertebral arteries are patent within the neck bilaterally without hemodynamically significant stenosis. The left vertebral artery is dominant. Skeleton: No acute bony abnormality or aggressive osseous lesion. Other neck: No neck mass or cervical lymphadenopathy. Upper chest: No consolidation within the imaged lung apices. Review of the MIP images confirms the above findings CTA HEAD FINDINGS Anterior circulation: The pre cavernous right ICA is severely stenotic. There is occlusion of the right ICA beginning at the cavernous segment and this vessel remains occluded through the ICA terminus. There is non opacification of the right middle cerebral artery vessels. Additionally, there is non opacification of the A1 right anterior cerebral artery. The A2 and more distal right  anterior cerebral artery is markedly diminutive with multifocal high-grade stenoses throughout. The intracranial left ICA is patent without significant stenosis. The M1 left middle cerebral artery is patent without significant stenosis. No left M2 proximal branch occlusion or high-grade proximal stenosis is identified. The left anterior cerebral artery is patent without high-grade proximal stenosis. There is mild fusiform dilation of the supraclinoid left ICA and left ICA terminus which may reflect an aneurysm. Posterior circulation: The non dominant intracranial right vertebral artery is developmentally diminutive, but patent and terminates as the right PICA. The dominant intracranial left vertebral artery is patent without significant stenosis, as is the basilar artery. The right posterior cerebral artery appears predominantly fetal in origin and the right posterior communicating artery appears occluded. There is flow within a hypoplastic right P1 segment and within the P2 and more distal right posterior cerebral artery. Fetal origin left posterior cerebral artery which is patent without significant proximal stenosis. Venous sinuses: Within limitations of contrast timing, no convincing thrombus. Anatomic variants: None significant Review of the MIP images confirms the above findings IMPRESSION: CT head: Redemonstrated extensive acute on chronic infarcts within the right cerebral hemisphere with acute infarcts affecting the right MCA and PCA vascular territories. No evidence of hemorrhagic conversion. Similar degree of mass effect. No midline shift. CTA neck: 1. There is soft plaque within the right carotid bifurcation/ICA bulb without hemodynamically significant stenosis. However, distal to this, the right cervical ICA is markedly asymmetrically diminutive. 2. The left common carotid artery, cervical internal carotid artery and bilateral vertebral arteries are patent within the neck without hemodynamically  significant stenosis. CTA head: 1. 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 The right ICA becomes occluded at the cavernous segment and remains occluded through the supraclinoid segment. There is complete non opacification of the right middle cerebral arteries. Additionally, there is non opacification of the A1 right anterior cerebral artery. The A2 and more distal right anterior cerebral artery is markedly diminutive with multifocal high-grade stenoses. There is a fetal origin right posterior cerebral artery with occluded right posterior communicating artery. There is reconstitution of flow within the P2 and more distal right PCA. 2. 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 Electronically Signed: By: Jackey Loge DO On: 04/10/2020 17:51   MR BRAIN WO CONTRAST  Result Date: 04/10/2020 CLINICAL DATA:  Stroke follow-up. History of stroke with new headache. EXAM: MRI HEAD WITHOUT CONTRAST TECHNIQUE: Multiplanar, multiecho pulse sequences of the brain and surrounding structures were obtained without intravenous contrast. COMPARISON:  Head CT from yesterday FINDINGS: Brain: Large remote right cerebral infarct affecting the ACA territory and upper division MCA territory with dense encephalomalacia. Restricted diffusion is seen in the remaining right cerebral cortex, also involving white matter. This is attributed to infarct rather than  seizure phenomenon given the this spans both the MCA and PCA distributions but is still most likely acute infarct. The right PCA territory appeared spared on prior head CT but is involved on this study. Wallerian degeneration in the right cortical spinal tracts. No acute infarct in the left hemisphere or posterior fossa. No mass, hydrocephalus, or acute hemorrhage. Vascular: No flow void seen in the right ICA until the supraclinoid segment. Skull and upper cervical spine: Normal marrow signal Sinuses/Orbits: Bilateral  maxillary sinus mucosal thickening with fluid/secretions on the right. Maxillofacial CT was performed yesterday. IMPRESSION: 1. Acute inferior division right MCA territory infarct. Acute right PCA territory infarct (which occurred even more recently based on the prior head CT). 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 2. Pre-existing right ACA and upper division MCA territory infarcts. 3. 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 Electronically Signed   By: Marnee Spring M.D.   On: 04/10/2020 08:13   ECHOCARDIOGRAM COMPLETE  Result Date: 04/10/2020    ECHOCARDIOGRAM REPORT   Patient Name:   LEZLEE Alamo Date of Exam: 04/10/2020 Medical Rec #:  [id number]       Height:       67.0 in Accession #:    [id number]      Weight:       147.0 lb Date of Birth:  1951-06-07      BSA:          1.774 m Patient Age:    68 years        BP:           Not listed in chart/Not listed in                                               chart mmHg Patient Gender: F               HR:           84 bpm. Exam Location:  [facility] Procedure: 2D Echo, Cardiac Doppler and Color Doppler Indications:     Stroke 434.91  History:         Patient has no prior history of Echocardiogram examinations.                  Stroke; Risk Factors:Hypertension and Diabetes.  Sonographer:     Cristela Blue RDCS (AE) Referring Phys:  [id number] Dara Lords AROOR Diagnosing Phys: Lorine Bears MD  Sonographer Comments: No apical window, no subcostal window and Technically challenging study due to limited acoustic windows. IMPRESSIONS  1. Left ventricular ejection fraction, by estimation, is 60 to 65%. The left ventricle has normal function. The left ventricle has no regional wall motion abnormalities. Left ventricular diastolic function could not be evaluated.  2. Right ventricular systolic function is normal. The right ventricular size is normal. There is mildly elevated pulmonary artery systolic pressure.  3. The mitral valve  is normal in structure. No evidence of mitral valve regurgitation. No evidence of mitral stenosis.  4. The aortic valve is normal in structure. Aortic valve regurgitation is not visualized. No aortic stenosis is present.  5. The inferior vena cava is normal in size with greater than 50% respiratory variability, suggesting right atrial pressure of 3 mmHg.  6. No apical or subcostal windows. FINDINGS  Left Ventricle: Left ventricular ejection fraction,  by estimation, is 60 to 65%. The left ventricle has normal function. The left ventricle has no regional wall motion abnormalities. The left ventricular internal cavity size was normal in size. There is  no left ventricular hypertrophy. Left ventricular diastolic function could not be evaluated. Right Ventricle: The right ventricular size is normal. No increase in right ventricular wall thickness. Right ventricular systolic function is normal. There is mildly elevated pulmonary artery systolic pressure. The tricuspid regurgitant velocity is 2.99  m/s, and with an assumed right atrial pressure of 3 mmHg, the estimated right ventricular systolic pressure is 38.8 mmHg. Left Atrium: Left atrial size was normal in size. Right Atrium: Right atrial size was normal in size. Pericardium: There is no evidence of pericardial effusion. Mitral Valve: The mitral valve is normal in structure. Normal mobility of the mitral valve leaflets. No evidence of mitral valve regurgitation. No evidence of mitral valve stenosis. Tricuspid Valve: The tricuspid valve is normal in structure. Tricuspid valve regurgitation is trivial. No evidence of tricuspid stenosis. Aortic Valve: The aortic valve is normal in structure. Aortic valve regurgitation is not visualized. No aortic stenosis is present. Pulmonic Valve: The pulmonic valve was normal in structure. Pulmonic valve regurgitation is not visualized. No evidence of pulmonic stenosis. Aorta: The aortic root is normal in size and structure. Venous:  The inferior vena cava was not well visualized. The inferior vena cava is normal in size with greater than 50% respiratory variability, suggesting right atrial pressure of 3 mmHg. IAS/Shunts: The interatrial septum was not well visualized.  LEFT VENTRICLE PLAX 2D LVIDd:         3.40 cm LVIDs:         1.83 cm LV PW:         0.96 cm LV IVS:        0.90 cm LVOT diam:     2.10 cm LVOT Area:     3.46 cm  LEFT ATRIUM         Index LA diam:    2.90 cm 1.63 cm/m                        PULMONIC VALVE AORTA                 PV Vmax:        0.76 m/s Ao Root diam: 2.50 cm PV Peak grad:   2.3 mmHg                       RVOT Peak grad: 2 mmHg  TRICUSPID VALVE TR Peak grad:   35.8 mmHg TR Vmax:        299.00 cm/s  SHUNTS Systemic Diam: 2.10 cm Lorine Bears MD Electronically signed by Lorine Bears MD Signature Date/Time: 04/10/2020/6:00:27 PM    Final    CT Maxillofacial Wo Contrast  Result Date: 04/09/2020 CLINICAL DATA:  Headache, prior stroke EXAM: CT HEAD WITHOUT CONTRAST TECHNIQUE: Contiguous axial images were obtained from the base of the skull through the vertex without intravenous contrast. COMPARISON:  None. FINDINGS: Brain: Large area of encephalomalacia involving the right frontal lobe. There is hypodensity involving the posterior right parietal lobe. No large extra-axial collections. No midline shift. There is ex vacuo dilatation of the right anterior horn of the lateral ventricle. Vascular: No hyperdense vessel or unexpected calcification. Skull: The skull is intact. No fracture or focal lesion identified. Sinuses/Orbits: The visualized paranasal sinuses and mastoid air cells are clear.  The orbits and globes intact. Other: None Osseous: No acute fracture or other significant osseous abnormality.The nasal bone, mandibles, zygomatic arches and pterygoid plates are intact. Orbits: No fracture identified. Unremarkable appearance of globes and orbits. Sinuses: There is near complete opacification of the right  maxillary sinus. A small amount of mucosal thickening seen within the left maxillary sinus. Soft tissues:  No acute findings. Limited intracranial: No acute findings. IMPRESSION: Large area of encephalomalacia involving the right MCA territory with hypodensity involving the right posterior parietal lobe likely chronic or subacute infarct. If clinical concern remains would recommend MRI for further evaluation. Findings suggestive of right maxillary sinusitis. Electronically Signed   By: Jonna ClarkBindu  Avutu M.D.   On: 04/09/2020 22:10     Medications   Scheduled Meds: .  stroke: mapping our early stages of recovery book   Does not apply Once  .  stroke: mapping our early stages of recovery book   Does not apply Once  . aspirin EC  81 mg Oral Daily  . enoxaparin (LOVENOX) injection  40 mg Subcutaneous Q24H  . fluticasone  1 spray Each Nare Daily  . gabapentin  200 mg Oral q AM  . gabapentin  400 mg Oral QHS  . senna  1 tablet Oral QHS  . sodium chloride flush  10-40 mL Intracatheter Q12H   Continuous Infusions:     LOS: 1 day    Time spent: 30 minutes with > 50% spent in coordination of care and direct patient contact    Pennie BanterKelly A Pandora Mccrackin, DO [facility]  04/11/2020, 8:28 AM    If 7PM-7AM, please contact night-coverage. How to contact the [facility] Attending or Consulting provider 7A - 7P or covering provider during after hours 7P -7A, for this patient?    1. Check the care team in [facility] and look for a) attending/consulting [facility] provider listed and b) the [facility] team listed 2. Log into [email or web address] and use [place] universal password to access. If you do not have the password, please contact the hospital operator. 3. Locate the [facility] provider you are looking for under [facility] and page to a number that you can be directly reached. 4. If you still have difficulty reaching the provider, please page the [facility] (Director on Call) for the Hospitalists listed on amion for assistance.

## 2020-04-11 NOTE — Evaluation (Signed)
Physical Therapy Evaluation Patient Details Name: Stephanie Mason MRN: [id number] DOB: 06-06-1951 Today's Date: 04/11/2020   History of Present Illness  Stephanie Mason is a 69 y.o. 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 Patient was in her usual state of health until yesterday when she developed onset of right periorbital headache, slurred speech, and blurry vision in her right eye. 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  Clinical Impression  Pt received lying in bed upon arrival to room and agreeable to participate in PT evaluation this morning. Pt immediately reporting 6/10 pain in her L ankle and states that she has not had her Gabapentin for 3 days which is probably causing her pain. Pt's LUE noted to be flaccid however pt demonstrated good awareness for joint protection, managing LUE with RUE. Pt required mod A for supine to sit for trunk elevation and LLE management to and over EOB. Pt sitting with back unsupported and required SBA for safety. Pt performed stand pivot sit transfer with max A for boosting hips to stand, balance while upright, blocking of L knee, and eccentric control on descent. Pt reports that she is close to baseline of functional mobility. Pt would benefit from skilled PT during acute stay to address deficits in strength, functional mobility, balance, and endurance. Recommend HHPT with supervision for OOB/mobility attempts at discharge to maximize safety and independence with functional mobility.    Follow Up Recommendations Home health PT;Supervision for mobility/OOB;Other (comment) (PACE program)    Equipment Recommendations  None recommended by PT (pt owns wheelchair and quad cane)    Recommendations for Other Services       Precautions / Restrictions Precautions Precautions:  Fall Restrictions Weight Bearing Restrictions: No      Mobility  Bed Mobility Overal bed mobility: Needs Assistance Bed Mobility: Supine to Sit     Supine to sit: Mod assist;HOB elevated     General bed mobility comments: Mod A for supine to sit for trunk elevation and min-mod A for LLE management to and over edge of bed; verbal cues for sequencing  Transfers Overall transfer level: Needs assistance Equipment used: None Transfers: Stand Pivot Transfers Sit to Stand: Max assist Stand pivot transfers: Max assist       General transfer comment: Max A for boosting hips from elevated bed, static standing balance, blocking of L knee when upright, and for eccentric control on descent  Ambulation/Gait             General Gait Details: not performed  Stairs            Wheelchair Mobility    Modified Rankin (Stroke Patients Only)       Balance Overall balance assessment: Needs assistance Sitting-balance support: Feet unsupported;Single extremity supported Sitting balance-Leahy Scale: Fair Sitting balance - Comments: sitting EOB with SBA for safety   Standing balance support: Single extremity supported;During functional activity Standing balance-Leahy Scale: Zero Standing balance comment: unable to maintain upright without max A from clinician                             Pertinent Vitals/Pain Pain Assessment: 0-10 Pain Score: 6  Pain Location: L ankle Pain Descriptors / Indicators: Aching Pain Intervention(s): Monitored during session;Repositioned    Home Living Family/patient expects to be  discharged to:: Private residence Living Arrangements: Spouse/significant other Available Help at Discharge: Friend(s);Personal care attendant;Available 24 hours/day Type of Home: House Home Access: Ramped entrance     Home Layout: One level Home Equipment: Cane - quad;Shower seat;Grab bars - tub/shower;Grab bars - toilet Additional Comments: Pt  clarifies that it is a single level home.    Prior Function Level of Independence: Needs assistance   Gait / Transfers Assistance Needed: Reports household mobility using QC, grab bars. W/c for community mobility - endorses self-propeling PRN     Comments: Pt inconsistent with answers changing from one level of assistance to more involved level of assistance.      Hand Dominance        Extremity/Trunk Assessment   Upper Extremity Assessment Upper Extremity Assessment: LUE deficits/detail;Generalized weakness;Defer to OT evaluation (grossly 3+ to 4-/5 RUE) LUE Deficits / Details: Flaccid LUE - pt states baseline    Lower Extremity Assessment Lower Extremity Assessment: Generalized weakness;LLE deficits/detail (RLE grossly 4-/5) LLE Deficits / Details: unable to reach full knee extension, tremor present; AFO donned LLE Coordination: decreased fine motor;decreased gross motor       Communication   Communication: Other (comment) (pt kept eyes closed for most of session; req'd cues to open)  Cognition Arousal/Alertness: Lethargic;Awake/alert (initially lethargic which improved as session progressed) Behavior During Therapy: WFL for tasks assessed/performed;Flat affect Overall Cognitive Status: No family/caregiver present to determine baseline cognitive functioning                                 General Comments: Pt able to answer questions however noted there to be varying answers to same questions. Pt asked clinician to make a phone call for her and reported the phone number several times chaning the number each time slightly.      General Comments      Exercises Other Exercises Other Exercises: pt attempted seated marches x 10 bilaterally, however noted limited active hip flexion on LLE and RLE fatigued at rep 5 and range decreased   Assessment/Plan    PT Assessment Patient needs continued PT services  PT Problem List Decreased strength;Decreased range of  motion;Decreased activity tolerance;Decreased balance;Decreased mobility;Decreased coordination;Decreased safety awareness;Pain       PT Treatment Interventions DME instruction;Gait training;Functional mobility training;Therapeutic activities;Therapeutic exercise;Balance training;Neuromuscular re-education;Cognitive remediation;Patient/family education;Wheelchair mobility training    PT Goals (Current goals can be found in the Care Plan section)  Acute Rehab PT Goals Patient Stated Goal: to go home PT Goal Formulation: With patient Time For Goal Achievement: 04/25/20 Potential to Achieve Goals: Good Additional Goals Additional Goal #1: Pt will perform bed mobility, to include rolling and supine/sidelying <> sit, with no more than CGA. (to decrease caregiver burden) Additional Goal #2: Pt will perform sit <> stand and stand/sit pivot sit transfers with no more than min A and LRAD, donning L AFO. (to maximize safety and independence with transfers)    Frequency 7X/week   Barriers to discharge        Co-evaluation               AM-PAC PT "6 Clicks" Mobility  Outcome Measure Help needed turning from your back to your side while in a flat bed without using bedrails?: A Little Help needed moving from lying on your back to sitting on the side of a flat bed without using bedrails?: A Lot Help needed moving to and from a bed to a  chair (including a wheelchair)?: A Lot Help needed standing up from a chair using your arms (e.g., wheelchair or bedside chair)?: A Lot Help needed to walk in hospital room?: Total Help needed climbing 3-5 steps with a railing? : Total 6 Click Score: 11    End of Session Equipment Utilized During Treatment: Gait belt Activity Tolerance: Patient limited by fatigue Patient left: in chair;with call bell/phone within reach;with chair alarm set Nurse Communication: Mobility status PT Visit Diagnosis: Unsteadiness on feet (R26.81);Other abnormalities of gait  and mobility (R26.89);Muscle weakness (generalized) (M62.81);Other symptoms and signs involving the nervous system (R29.898);Hemiplegia and hemiparesis;Pain Hemiplegia - Right/Left: Left Hemiplegia - dominant/non-dominant: Non-dominant Hemiplegia - caused by: Cerebral infarction Pain - Right/Left: Left Pain - part of body: Ankle and joints of foot    Time: 8325-4982 PT Time Calculation (min) (ACUTE ONLY): 36 min   Charges:   PT Evaluation $PT Eval Moderate Complexity: 1 Mod PT Treatments $Therapeutic Exercise: 8-22 mins        Frederich Chick, SPT  Merwyn Hodapp 04/11/2020, 12:57 PM

## 2020-04-12 DIAGNOSIS — R5081 Fever presenting with conditions classified elsewhere: Secondary | ICD-10-CM

## 2020-04-12 LAB — BETA-2-GLYCOPROTEIN I ABS, IGG/M/A
Beta-2 Glyco I IgG: <9 GPI IgG units (ref 0–20)
Beta-2-Glycoprotein I IgA: <9 GPI IgA units (ref 0–25)
Beta-2-Glycoprotein I IgM: <9 GPI IgM units (ref 0–32)

## 2020-04-12 LAB — CARDIOLIPIN ANTIBODIES, IGG, IGM, IGA
Anticardiolipin IgA: <9 APL U/mL (ref 0–11)
Anticardiolipin IgG: <9 GPL U/mL (ref 0–14)
Anticardiolipin IgM: 9 MPL U/mL (ref 0–12)

## 2020-04-12 LAB — HIV ANTIBODY (ROUTINE TESTING W REFLEX): HIV Screen 4th Generation wRfx: NONREACTIVE

## 2020-04-12 LAB — ANTIEXTRACTABLE NUCLEAR AG
ENA SM Ab Ser-aCnc: <0.2 AI (ref 0.0–0.9)
Ribonucleic Protein: 0.2 AI (ref 0.0–0.9)

## 2020-04-12 LAB — LUPUS ANTICOAGULANT PANEL
DRVVT: 34.9 s (ref 0.0–47.0)
PTT Lupus Anticoagulant: 33.7 s (ref 0.0–51.9)

## 2020-04-12 LAB — ANTITHROMBIN III: AntiThromb III Func: 101 % (ref 75–120)

## 2020-04-12 LAB — ANA: Anti Nuclear Antibody (ANA): NEGATIVE

## 2020-04-12 LAB — HEMOGLOBIN A1C
Hgb A1c MFr Bld: 5.5 % (ref 4.8–5.6)
Mean Plasma Glucose: 111.15 mg/dL

## 2020-04-12 LAB — PROTEIN S ACTIVITY: Protein S Activity: 63 % (ref 63–140)

## 2020-04-12 LAB — PROTEIN C ACTIVITY: Protein C Activity: 98 % (ref 73–180)

## 2020-04-12 LAB — HOMOCYSTEINE: Homocysteine: 11.1 umol/L (ref 0.0–17.2)

## 2020-04-12 LAB — PROTEIN S, TOTAL: Protein S Ag, Total: 93 % (ref 60–150)

## 2020-04-12 NOTE — Progress Notes (Signed)
Dr. Sherryll Burger made aware of pts fever- tylenol given /  blood cultures ordered/ will continue to monitor

## 2020-04-12 NOTE — Progress Notes (Addendum)
PROGRESS NOTE    Stephanie Mason   ATF:[id number]  DOB: 01-30-1951  PCP: Patient, No Pcp Per    DOA: 04/09/2020 LOS: 2   Brief Narrative   Stephanie Mason is an 69 y.o. 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  She presented to the ED on evening of 04/09/20 after developing a severe R periorbital headache the day before, associated nausea, vomiting, slurred speech and blurry vision in R eye.  [facility] sent her to the ED.     ED Course:  Afebrile, hypertensive 156/85, tachypnea, HR 74.   BMP unremarkable other than glucose 148.  CBC showed hemoconcentration with Hbg 15.8 and macrocytosis MCV 107.5, no leukocytosis.    CT head and maxillofacial - Large area of encephalomalacia involving the right MCA territory with hypodensity involving the right posterior parietal lobe likely chronic or subacute infarct. If clinical concern remains would recommend MRI for further evaluation.  Findings suggestive of right maxillary sinusitis.  MRI brain - 1. Acute inferior division right MCA territory infarct. Acute right PCA territory infarct (which occurred even more recently based on the prior head CT). 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 2. Pre-existing right ACA and upper division MCA territory infarcts. 3. 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        Assessment & Plan   Active Problems:   Acute CVA (cerebrovascular accident) (HCC)   Stroke (cerebrum) (HCC)   Acute/subacute MCA and PCA stroke - etiology is to be determined.  She has history of two prior strokes.   Patient's for stroke was after starting oral contraceptive pills, pending hypercoagulable work-up  --2D echo within normal limits, TEE requested, to be performed tomorrow  along with Zio on 9/2 --continue ASA 80 mg PO daily,  -Not placed on statin as she had significant transaminitis in the past per [facility]CP [facility] --Permissive HTN - BP goal 185/110 for now --Telemetry --Neurochecks  New onset fever T-max 103.3 last evening on 8/31 Monitor, blood cultures x2.  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  Avoid hypotonic fluids.  Neuro checks as above.  Repeat CT head to eval for midline shift.  Macrocytosis - chronic.  Patient was followed by Dr. Donneta Romberg, he attributed this to "liver disease", pt denies hx of this however.  His note is available under MRN #[id number] are supposed to be merged. Further work-up there is an inpatient or outpatient as warranted.  History of seizure disorder - taken off her Keppra last year, no seizures since.  Left leg neuropathy/paresis - Continue gabapentin 600 qAM, 300 in afternoon   DVT prophylaxis: enoxaparin (LOVENOX) injection 40 mg Start: 04/10/20 1100   Diet:  Diet Orders (From admission, onward)    Start     Ordered   04/13/20 0001  Diet NPO time specified Except for: Sips with Meds  Diet effective midnight       Question:  Except for  Answer:  Sips with Meds   04/12/20 1100   04/11/20 0732  DIET DYS 3 Room service appropriate? Yes; Fluid consistency: Thin  Diet effective now  Question Answer Comment  Room service appropriate? Yes   Fluid consistency: Thin      04/11/20 0732            Code Status: Full Code    Subjective 04/12/20   Had a fever with T-max of 103.3 last night, was febrile this morning as well.  Sleepy now.  Family at bedside Disposition Plan & Communication   Status is: Inpatient  Remains inpatient appropriate because:Ongoing diagnostic testing needed not appropriate for outpatient work up.  Requires close monitoring of  neurologic status given large stroke.  D/C pending clearance by neurology.   Dispo: The patient is from: Home              Anticipated d/c is to: Home              Anticipated d/c date is: 2-3 days              Patient currently is not medically stable to d/c.  Still febrile, requiring TEE and loop monitoring   Family Communication: Family at bedside updated   Consults, Procedures, Significant Events   Consultants:   Neurology  Procedures:  TEE and Zio planned for tomorrow on 9/2  Antimicrobials:   None   Objective   Vitals:   04/12/20 0512 04/12/20 0807 04/12/20 1121 04/12/20 1357  BP: (!) 164/69 125/72 118/67   Pulse: 87 98 84   Resp: 20 19 17    Temp: 99.1 F (37.3 C) 100.2 F (37.9 C) 100.3 F (37.9 C) 98.4 F (36.9 C)  TempSrc: Oral Oral Oral   SpO2: 99% 99% 98%   Weight:      Height:        Intake/Output Summary (Last 24 hours) at 04/12/2020 1443 Last data filed at 04/12/2020 1004 Gross per 24 hour  Intake 360 ml  Output 400 ml  Net -40 ml   Filed Weights   04/10/20 1058 04/10/20 2342 04/11/20 0427  Weight: 66.7 kg 73.4 kg 73.4 kg    Physical Exam:  General exam: awake but drowsy appearing, no acute distress Respiratory system: CTAB, no wheezes, rales or rhonchi, normal respiratory effort. Cardiovascular system: normal S1/S2, RRR, no pedal edema.   Gastrointestinal system: soft, NT, ND Central nervous system:  She is sleepy so did not examine. Psychiatry: normal mood, congruent affect, judgement and insight appear normal  Labs   Data Reviewed: I have personally reviewed following labs and imaging studies  CBC: Recent Labs  Lab 04/09/20 2131  WBC 8.3  NEUTROABS 7.1  HGB 15.8*  HCT 48.6*  MCV 107.5*  PLT 213   Basic Metabolic Panel: Recent Labs  Lab 04/09/20 2131  NA 139  K 4.3  CL 102  CO2 28  GLUCOSE 148*  BUN 10  CREATININE 0.88  CALCIUM 9.6   GFR: Estimated Creatinine Clearance: 61.2 mL/min (by C-G formula based on SCr of  0.88 mg/dL). Liver Function Tests: No results for input(s): AST, ALT, ALKPHOS, BILITOT, PROT, ALBUMIN in the last 168 hours. No results for input(s): LIPASE, AMYLASE in the last 168 hours. No results for input(s): AMMONIA in the last 168 hours. Coagulation Profile: No results for input(s): INR, PROTIME in the last 168 hours. Cardiac Enzymes: No results for input(s): CKTOTAL, CKMB, CKMBINDEX, TROPONINI in the last 168 hours. BNP (last 3 results) No results for input(s): PROBNP in the last 8760 hours. HbA1C: Recent Labs    04/11/20 0833  HGBA1C 5.5   CBG: No results for input(s): GLUCAP  in the last 168 hours. Lipid Profile: Recent Labs    04/11/20 0833  CHOL 150  HDL 60  LDLCALC 80  TRIG 50  CHOLHDL 2.5   Thyroid Function Tests: No results for input(s): TSH, T4TOTAL, FREET4, T3FREE, THYROIDAB in the last 72 hours. Anemia Panel: No results for input(s): VITAMINB12, FOLATE, FERRITIN, TIBC, IRON, RETICCTPCT in the last 72 hours. Sepsis Labs: No results for input(s): PROCALCITON, LATICACIDVEN in the last 168 hours.  Recent Results (from the past 240 hour(s))  SARS Coronavirus 2 by RT PCR (hospital order, performed in [facility] hospital lab) Nasopharyngeal Nasopharyngeal Swab     Status: None   Collection Time: 04/10/20 10:58 AM   Specimen: Nasopharyngeal Swab  Result Value Ref Range Status   SARS Coronavirus 2 NEGATIVE NEGATIVE Final    Comment: (NOTE) SARS-CoV-2 target nucleic acids are NOT DETECTED.  The SARS-CoV-2 RNA is generally detectable in upper and lower respiratory specimens during the acute phase of infection. The lowest concentration of SARS-CoV-2 viral copies this assay can detect is 250 copies / mL. A negative result does not preclude SARS-CoV-2 infection and should not be used as the sole basis for treatment or other patient management decisions.  A negative result may occur with improper specimen collection / handling, submission of specimen other than  nasopharyngeal swab, presence of viral mutation(s) within the areas targeted by this assay, and inadequate number of viral copies (<250 copies / mL). A negative result must be combined with clinical observations, patient history, and epidemiological information.  Fact Sheet for Patients:   BoilerBrush.com.cy  Fact Sheet for Healthcare Providers: https://pope.com/  This test is not yet approved or  cleared by the [place] FDA and has been authorized for detection and/or diagnosis of SARS-CoV-2 by FDA under an Emergency Use Authorization (EUA).  This EUA will remain in effect (meaning this test can be used) for the duration of the COVID-19 declaration under Section 564(b)(1) of the Act, 21 U.S.C. section 360bbb-3(b)(1), unless the authorization is terminated or revoked sooner.  Performed at [facility], [address] [place], [place] [address]       Imaging Studies   CT ANGIO HEAD W OR WO CONTRAST  Addendum Date: 04/10/2020   ADDENDUM REPORT: 04/10/2020 18:09 ADDENDUM: These results were called by telephone at the time of interpretation on 04/10/2020 at 6:09 pm to provider Dr. Arthur Holms , who verbally acknowledged these results. Electronically Signed   By: Jackey Loge DO   On: 04/10/2020 18:09   Result Date: 04/10/2020 CLINICAL DATA:  Neuro deficit, acute stroke suspected. EXAM: CT ANGIOGRAPHY HEAD AND NECK TECHNIQUE: Multidetector CT imaging of the head and neck was performed using the standard protocol during bolus administration of intravenous contrast. Multiplanar CT image reconstructions and MIPs were obtained to evaluate the vascular anatomy. Carotid stenosis measurements (when applicable) are obtained utilizing NASCET criteria, using the distal internal carotid diameter as the denominator. CONTRAST:  75mL OMNIPAQUE IOHEXOL 350 MG/ML SOLN COMPARISON:  Brain MRI 04/10/2020, head CT 04/09/2020, MRA head 09/15/2012.  FINDINGS: CT HEAD FINDINGS Brain: Again demonstrated is extensive acute on chronic infarcts within the right cerebral hemisphere with acute infarcts affecting the right MCA and PCA vascular territories. Similar degree of mass effect. Associated ex vacuo dilatation of the frontal horn of the right lateral ventricle. No midline shift. No evidence of hemorrhagic conversion. No extra-axial fluid collection. No evidence of intracranial mass. Vascular: Reported below. Skull: Normal. Negative for fracture or focal lesion. Sinuses: Extensive partial  opacification of the right maxillary sinus. No significant mastoid effusion. Orbits: No acute abnormality. Review of the MIP images confirms the above findings CTA NECK FINDINGS Aortic arch: Common origin of the innominate and left common carotid arteries. The visualized aortic arch is unremarkable. No hemodynamically significant innominate or proximal subclavian artery stenosis. Right carotid system: The CCA is patent to the bifurcation without significant stenosis. Soft tissue plaque within the ICA bulb without hemodynamically significant stenosis (50% or greater). Distal to this, there is a markedly diminutive appearance of the cervical ICA. The intracranial left ICA becomes occluded at the cavernous segment and remains occluded to the terminus. Left carotid system: CCA and ICA patent within the neck without hemodynamically significant stenosis (50% or greater). Mild soft plaque within the carotid bifurcation and proximal ICA. Vertebral arteries: The vertebral arteries are patent within the neck bilaterally without hemodynamically significant stenosis. The left vertebral artery is dominant. Skeleton: No acute bony abnormality or aggressive osseous lesion. Other neck: No neck mass or cervical lymphadenopathy. Upper chest: No consolidation within the imaged lung apices. Review of the MIP images confirms the above findings CTA HEAD FINDINGS Anterior circulation: The pre  cavernous right ICA is severely stenotic. There is occlusion of the right ICA beginning at the cavernous segment and this vessel remains occluded through the ICA terminus. There is non opacification of the right middle cerebral artery vessels. Additionally, there is non opacification of the A1 right anterior cerebral artery. The A2 and more distal right anterior cerebral artery is markedly diminutive with multifocal high-grade stenoses throughout. The intracranial left ICA is patent without significant stenosis. The M1 left middle cerebral artery is patent without significant stenosis. No left M2 proximal branch occlusion or high-grade proximal stenosis is identified. The left anterior cerebral artery is patent without high-grade proximal stenosis. There is mild fusiform dilation of the supraclinoid left ICA and left ICA terminus which may reflect an aneurysm. Posterior circulation: The non dominant intracranial right vertebral artery is developmentally diminutive, but patent and terminates as the right PICA. The dominant intracranial left vertebral artery is patent without significant stenosis, as is the basilar artery. The right posterior cerebral artery appears predominantly fetal in origin and the right posterior communicating artery appears occluded. There is flow within a hypoplastic right P1 segment and within the P2 and more distal right posterior cerebral artery. Fetal origin left posterior cerebral artery which is patent without significant proximal stenosis. Venous sinuses: Within limitations of contrast timing, no convincing thrombus. Anatomic variants: None significant Review of the MIP images confirms the above findings IMPRESSION: CT head: Redemonstrated extensive acute on chronic infarcts within the right cerebral hemisphere with acute infarcts affecting the right MCA and PCA vascular territories. No evidence of hemorrhagic conversion. Similar degree of mass effect. No midline shift. CTA neck: 1.  There is soft plaque within the right carotid bifurcation/ICA bulb without hemodynamically significant stenosis. However, distal to this, the right cervical ICA is markedly asymmetrically diminutive. 2. The left common carotid artery, cervical internal carotid artery and bilateral vertebral arteries are patent within the neck without hemodynamically significant stenosis. CTA head: 1. 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 The right ICA becomes occluded at the cavernous segment and remains occluded through the supraclinoid segment. There is complete non opacification of the right middle cerebral arteries. Additionally, there is non opacification of the A1 right anterior cerebral artery. The A2 and more distal right anterior cerebral artery is markedly diminutive with multifocal high-grade stenoses. There is a  fetal origin right posterior cerebral artery with occluded right posterior communicating artery. There is reconstitution of flow within the P2 and more distal right PCA. 2. 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 Electronically Signed: By: Jackey Loge DO On: 04/10/2020 17:51   CT HEAD WO CONTRAST  Result Date: 04/11/2020 CLINICAL DATA:  Follow-up stroke. EXAM: CT HEAD WITHOUT CONTRAST TECHNIQUE: Contiguous axial images were obtained from the base of the skull through the vertex without intravenous contrast. COMPARISON:  CT and MR studies done yesterday. FINDINGS: Brain: Acute infarction throughout the remainder of the right hemisphere as shown by studies yesterday. Old infarction in the right frontal region. The area of acute infarction shows mild swelling but there is no midline shift. No evidence of hemorrhage. No acute stroke seen affecting the left hemisphere, the brainstem or the cerebellum. Vascular: No new vascular finding. Skull: Negative Sinuses/Orbits: Opacified right maxillary sinus. Other visible sinuses are clear. Orbits  negative. Other: None IMPRESSION: Old right frontal infarction. Acute infarction throughout the remainder of the right hemisphere as shown by studies yesterday. The area of acute infarction shows mild swelling but there is no midline shift. No evidence of hemorrhage. Electronically Signed   By: Paulina Fusi M.D.   On: 04/11/2020 18:38   CT ANGIO NECK W OR WO CONTRAST  Addendum Date: 04/10/2020   ADDENDUM REPORT: 04/10/2020 18:09 ADDENDUM: These results were called by telephone at the time of interpretation on 04/10/2020 at 6:09 pm to provider Dr. Arthur Holms , who verbally acknowledged these results. Electronically Signed   By: Jackey Loge DO   On: 04/10/2020 18:09   Result Date: 04/10/2020 CLINICAL DATA:  Neuro deficit, acute stroke suspected. EXAM: CT ANGIOGRAPHY HEAD AND NECK TECHNIQUE: Multidetector CT imaging of the head and neck was performed using the standard protocol during bolus administration of intravenous contrast. Multiplanar CT image reconstructions and MIPs were obtained to evaluate the vascular anatomy. Carotid stenosis measurements (when applicable) are obtained utilizing NASCET criteria, using the distal internal carotid diameter as the denominator. CONTRAST:  33mL OMNIPAQUE IOHEXOL 350 MG/ML SOLN COMPARISON:  Brain MRI 04/10/2020, head CT 04/09/2020, MRA head 09/15/2012. FINDINGS: CT HEAD FINDINGS Brain: Again demonstrated is extensive acute on chronic infarcts within the right cerebral hemisphere with acute infarcts affecting the right MCA and PCA vascular territories. Similar degree of mass effect. Associated ex vacuo dilatation of the frontal horn of the right lateral ventricle. No midline shift. No evidence of hemorrhagic conversion. No extra-axial fluid collection. No evidence of intracranial mass. Vascular: Reported below. Skull: Normal. Negative for fracture or focal lesion. Sinuses: Extensive partial opacification of the right maxillary sinus. No significant mastoid effusion.  Orbits: No acute abnormality. Review of the MIP images confirms the above findings CTA NECK FINDINGS Aortic arch: Common origin of the innominate and left common carotid arteries. The visualized aortic arch is unremarkable. No hemodynamically significant innominate or proximal subclavian artery stenosis. Right carotid system: The CCA is patent to the bifurcation without significant stenosis. Soft tissue plaque within the ICA bulb without hemodynamically significant stenosis (50% or greater). Distal to this, there is a markedly diminutive appearance of the cervical ICA. The intracranial left ICA becomes occluded at the cavernous segment and remains occluded to the terminus. Left carotid system: CCA and ICA patent within the neck without hemodynamically significant stenosis (50% or greater). Mild soft plaque within the carotid bifurcation and proximal ICA. Vertebral arteries: The vertebral arteries are patent within the neck  bilaterally without hemodynamically significant stenosis. The left vertebral artery is dominant. Skeleton: No acute bony abnormality or aggressive osseous lesion. Other neck: No neck mass or cervical lymphadenopathy. Upper chest: No consolidation within the imaged lung apices. Review of the MIP images confirms the above findings CTA HEAD FINDINGS Anterior circulation: The pre cavernous right ICA is severely stenotic. There is occlusion of the right ICA beginning at the cavernous segment and this vessel remains occluded through the ICA terminus. There is non opacification of the right middle cerebral artery vessels. Additionally, there is non opacification of the A1 right anterior cerebral artery. The A2 and more distal right anterior cerebral artery is markedly diminutive with multifocal high-grade stenoses throughout. The intracranial left ICA is patent without significant stenosis. The M1 left middle cerebral artery is patent without significant stenosis. No left M2 proximal branch occlusion or  high-grade proximal stenosis is identified. The left anterior cerebral artery is patent without high-grade proximal stenosis. There is mild fusiform dilation of the supraclinoid left ICA and left ICA terminus which may reflect an aneurysm. Posterior circulation: The non dominant intracranial right vertebral artery is developmentally diminutive, but patent and terminates as the right PICA. The dominant intracranial left vertebral artery is patent without significant stenosis, as is the basilar artery. The right posterior cerebral artery appears predominantly fetal in origin and the right posterior communicating artery appears occluded. There is flow within a hypoplastic right P1 segment and within the P2 and more distal right posterior cerebral artery. Fetal origin left posterior cerebral artery which is patent without significant proximal stenosis. Venous sinuses: Within limitations of contrast timing, no convincing thrombus. Anatomic variants: None significant Review of the MIP images confirms the above findings IMPRESSION: CT head: Redemonstrated extensive acute on chronic infarcts within the right cerebral hemisphere with acute infarcts affecting the right MCA and PCA vascular territories. No evidence of hemorrhagic conversion. Similar degree of mass effect. No midline shift. CTA neck: 1. There is soft plaque within the right carotid bifurcation/ICA bulb without hemodynamically significant stenosis. However, distal to this, the right cervical ICA is markedly asymmetrically diminutive. 2. The left common carotid artery, cervical internal carotid artery and bilateral vertebral arteries are patent within the neck without hemodynamically significant stenosis. CTA head: 1. 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 The right ICA becomes occluded at the cavernous segment and remains occluded through the supraclinoid segment. There is complete non opacification of the right middle cerebral  arteries. Additionally, there is non opacification of the A1 right anterior cerebral artery. The A2 and more distal right anterior cerebral artery is markedly diminutive with multifocal high-grade stenoses. There is a fetal origin right posterior cerebral artery with occluded right posterior communicating artery. There is reconstitution of flow within the P2 and more distal right PCA. 2. 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 Electronically Signed: By: Jackey Loge DO On: 04/10/2020 17:51     Medications   Scheduled Meds: .  stroke: mapping our early stages of recovery book   Does not apply Once  .  stroke: mapping our early stages of recovery book   Does not apply Once  . aspirin EC  81 mg Oral Daily  . enoxaparin (LOVENOX) injection  40 mg Subcutaneous Q24H  . fluticasone  1 spray Each Nare Daily  . gabapentin  200 mg Oral q AM  . gabapentin  400 mg Oral QHS  . senna  1 tablet Oral QHS  .  sodium chloride flush  10-40 mL Intracatheter Q12H   Continuous Infusions:     LOS: 2 days    Time spent: 30 minutes with > 50% spent in coordination of care and direct patient contact    Coni Homesley Sherryll BurgerShah, DO [facility]  04/12/2020, 2:43 PM    If 7PM-7AM, please contact night-coverage. How to contact the [facility] Attending or Consulting provider 7A - 7P or covering provider during after hours 7P -7A, for this patient?    1. Check the care team in [facility] and look for a) attending/consulting [facility] provider listed and b) the [facility] team listed 2. Log into [email or web address] and use [place]'s universal password to access. If you do not have the password, please contact the hospital operator. 3. Locate the [facility] provider you are looking for under [facility] and page to a number that you can be directly reached. 4. If you still have difficulty reaching the provider, please page the [facility] (Director on Call) for the Hospitalists listed on amion for assistance.

## 2020-04-12 NOTE — Progress Notes (Signed)
pts temp 100.3 after tylenol / cooling blanket in place/  MD aware/ blood culx drawn/ no other interventions at this time/ will monitor close

## 2020-04-12 NOTE — Progress Notes (Signed)
  Speech Language Pathology Treatment: Dysphagia  Patient Details Name: Stephanie Mason MRN: [id number] DOB: [birth year] Today's Date: 04/12/2020 Time: 5188-4166 SLP Time Calculation (min) (ACUTE ONLY): 13 min  Assessment / Plan / Recommendation Clinical Impression  Skilled treatment session targeted pt's ongoing diet management. SLP facilitated session by providing skilled observation of pt consuming dysphagia 3 breakfast tray with thin liquids via straw. Pt with functional effective mastication, complete oral clearing and had the appearance of swift swallow. No overt s/s of aspiration were observed. When consuming her medicines, she had difficulty with posterior movement of tablet d/t dry mouth. With applesauce administered, pt able to consume without issue. Pt is likely to have a drier mouth given physical deficits, therefore recommend sips of liquids prior to consuming medicine and presenting medicine whole in applesauce. Pt has moderate left facial weakness and feels more comfortable remaining on dysphagia 3 diet. At this time, skilled ST intervention is no longer required. Education completed with pt's nurse.    HPI HPI: Per admitting H & P "Stephanie Mason is an 69 y.o. female with PMH significant for 2 strokes in the past.  Her first stroke was several decades ago and occurred 6 months after starting birth control pills.  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  Patient states she is on 81 mg aspirin a day.  Patient specifically denies any history of hypertension, diabetes or dyslipidemia."      SLP Plan  All goals met       Recommendations  Diet recommendations: Dysphagia 3 (mechanical soft);Thin liquid Liquids provided via: Straw Medication Administration: Whole meds with puree Supervision: Staff to assist with self feeding;Intermittent supervision to cue for compensatory strategies Compensations: Slow  rate;Small sips/bites;Minimize environmental distractions;Lingual sweep for clearance of pocketing Postural Changes and/or Swallow Maneuvers: Seated upright 90 degrees                Oral Care Recommendations: Oral care BID Follow up Recommendations: None SLP Visit Diagnosis: Dysphagia, oropharyngeal phase (R13.12) Plan: All goals met       GO               Mckenzye Cutright B. Rutherford Nail M.S., CCC-SLP, [place] Office [phone]  Stormy Fabian 04/12/2020, 1:23 PM

## 2020-04-12 NOTE — TOC Initial Note (Signed)
Transition of Care [facility]) - Initial/Assessment Note    Patient Details  Name: Stephanie Mason MRN: [id number] Date of Birth: May 21, 1951  Transition of Care [facility]) CM/SW Contact:    Shawn Route, RN Phone Number: 04/12/2020, 9:34 AM  Clinical Narrative:                  Sherron Monday with Zella Ball at [facility] and she reports patient has a caregiver in the home that cares for patient and she will return home.  They will provide transportation at discharge, as well as Home Health Services.   Expected Discharge Plan: Home w Home Health Services Barriers to Discharge: Continued Medical Work up   Patient Goals and CMS Choice   CMS Medicare.gov Compare Post Acute Care list provided to:: Patient    Expected Discharge Plan and Services Expected Discharge Plan: Home w Home Health Services   Discharge Planning Services: CM Consult Post Acute Care Choice: Home Health Living arrangements for the past 2 months: Single Family Home                                      Prior Living Arrangements/Services Living arrangements for the past 2 months: Single Family Home Lives with:: Self, Other (Comment) ([place] in home) Patient language and need for interpreter reviewed:: Yes Do you feel safe going back to the place where you live?: Yes      Need for Family Participation in Patient Care: Yes (Comment) Care giver support system in place?: Yes (comment) Current home services: DME, Home OT, Home PT, Home RN, Homehealth aide, Other (comment) (private pay caregivers) Criminal Activity/Legal Involvement Pertinent to Current Situation/Hospitalization: No - Comment as needed  Activities of Daily Living Home Assistive Devices/Equipment: Grab bars around toilet, Grab bars in shower, Wheelchair, Cane (specify quad or straight) (quad cane) ADL Screening (condition at time of admission) Patient's cognitive ability adequate to safely complete daily activities?: Yes Is the patient deaf or have difficulty  hearing?: No Does the patient have difficulty seeing, even when wearing glasses/contacts?: No Does the patient have difficulty concentrating, remembering, or making decisions?: No Patient able to express need for assistance with ADLs?: Yes Does the patient have difficulty dressing or bathing?: Yes Independently performs ADLs?: No Communication: Independent Dressing (OT): Needs assistance Is this a change from baseline?: Pre-admission baseline Grooming: Needs assistance Is this a change from baseline?: Pre-admission baseline Feeding: Independent Bathing: Needs assistance Is this a change from baseline?: Pre-admission baseline Toileting: Needs assistance Is this a change from baseline?: Pre-admission baseline In/Out Bed: Needs assistance Is this a change from baseline?: Pre-admission baseline Walks in Home: Needs assistance Is this a change from baseline?: Pre-admission baseline Does the patient have difficulty walking or climbing stairs?: Yes Weakness of Legs: Left Weakness of Arms/Hands: Left  Permission Sought/Granted                  Emotional Assessment Appearance:: Appears older than stated age       Alcohol / Substance Use: Not Applicable Psych Involvement: No (comment)  Admission diagnosis:  CVA (cerebral vascular accident) (HCC) [I63.9] Stroke (cerebrum) (HCC) [I63.9] Acute CVA (cerebrovascular accident) (HCC) [I63.9] Maxillary sinusitis, unspecified chronicity [J32.0] Acute nonintractable headache, unspecified headache type [R51.9] Patient Active Problem List   Diagnosis Date Noted  . Acute CVA (cerebrovascular accident) (HCC) 04/10/2020  . Stroke (cerebrum) (HCC) 04/10/2020   PCP:  Patient, No Pcp Per Pharmacy:  [facility] [place], [place] - [address] [place] [address] Phone: [phone] Fax: [phone]     Social Determinants of Health (SDOH) Interventions    Readmission Risk Interventions No flowsheet  data found.

## 2020-04-12 NOTE — Progress Notes (Signed)
    [facility] has been requested to perform a transesophageal echocardiogram on Stephanie Mason for acute CVA.  After careful review of history and examination, the risks and benefits of transesophageal echocardiogram have been explained including risks of esophageal damage, perforation (1:10,000 risk), bleeding, pharyngeal hematoma as well as other potential complications associated with conscious sedation including aspiration, arrhythmia, respiratory failure and death. Alternatives to treatment were discussed, questions were answered. Patient and son are willing to proceed.    2D TTE 04/10/2020: 1. Left ventricular ejection fraction, by estimation, is 60 to 65%. The  left ventricle has normal function. The left ventricle has no regional  wall motion abnormalities. Left ventricular diastolic function could not  be evaluated.  2. Right ventricular systolic function is normal. The right ventricular  size is normal. There is mildly elevated pulmonary artery systolic  pressure.  3. The mitral valve is normal in structure. No evidence of mitral valve  regurgitation. No evidence of mitral stenosis.  4. The aortic valve is normal in structure. Aortic valve regurgitation is  not visualized. No aortic stenosis is present.  5. The inferior vena cava is normal in size with greater than 50%  respiratory variability, suggesting right atrial pressure of 3 mmHg.  6. No apical or subcostal windows.    EKG: NSR, 69 bpm, no acute st/t changes Telemetry: SR   TEE 09/2012: - Left ventricle: Systolic function was normal. The  estimated ejection fraction was in the range of 55% to  60%.  - Aortic valve: No evidence of vegetation.  - Left atrium: The atrium was mildly dilated. No evidence of  thrombus in the atrial cavity or appendage.  - Right atrium: The atrium was moderately dilated.  - Atrial septum: No defect or patent foramen ovale was  identified.  - Tricuspid valve: Moderate  regurgitation.    Eula Listen, PA-C 04/12/2020 10:16 AM

## 2020-04-12 NOTE — Progress Notes (Signed)
Physical Therapy Treatment Patient Details Name: Stephanie Mason MRN: [id number] DOB: 06-Sep-1950 Today's Date: 04/12/2020    History of Present Illness Stephanie Mason is a 69 y.o. 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 Patient was in her usual state of health until yesterday when she developed onset of right periorbital headache, slurred speech, and blurry vision in her right eye. 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    PT Comments    Pt in bed upon arrival with son at bedside. Pt agreeable to PT session and reports no pain prior to mobility. Pt required mod A for elevating trunk, pivoting hips, and managing LLE to sit edge of bed. Pt performed visual acuity tasks, balance activities, and smooth pursuits while sitting EOB with SBA for supervision. Pt with good maintenance of balance with perturbations from multiple directions. Pt reported seeing clinician's face fully and  Symmetrically. Initially noted R gaze however pt able to follow clinician's finger in H pattern. Pt 1/10 correct answers with guessing how many fingers clinician was holding up in R eye and 0/10 in L eye. Distance was 12" and 24" away from pt. Pt then required max A for stand pivot sit transfer from bed to recliner chair and reported 4/10 pain in L heel, which pt reports happens from time to time. L AFO donned throughout session. Pt performed core strengthening activities while sitting in recliner chair. Pt left with BLE elevated with pillow under knees due to decreased knee extension bilaterally. Pt left with PA and son present in room.    Follow Up Recommendations  Home health PT;Supervision for mobility/OOB;Other (comment) (PACE program)     Equipment Recommendations  None recommended by PT    Recommendations for Other Services        Precautions / Restrictions Precautions Precautions: Fall Required Braces or Orthoses: Other Brace Other Brace: L AFO Restrictions Weight Bearing Restrictions: No    Mobility  Bed Mobility Overal bed mobility: Needs Assistance Bed Mobility: Supine to Sit     Supine to sit: Mod assist;HOB elevated     General bed mobility comments: Mod A for trunk elevation and min/mod A for LLE management to edge of bed and mod A to pivot hips to sitting square with edge of bed; verbal cues for sequencing  Transfers Overall transfer level: Needs assistance Equipment used: None Transfers: Stand Pivot Transfers   Stand pivot transfers: Max assist       General transfer comment: Max A to boost hips into standing, upright balance during pivot, and for eccentric control on descent; verbal cues for anterior truncal lean prior to transfer  Ambulation/Gait             General Gait Details: not performed   Stairs             Wheelchair Mobility    Modified Rankin (Stroke Patients Only)       Balance Overall balance assessment: Needs assistance Sitting-balance support: Feet unsupported;Single extremity supported Sitting balance-Leahy Scale: Good Sitting balance - Comments: SBA for safety with unsupported back sitting edge of bed   Standing balance support: Single extremity supported;During functional activity Standing balance-Leahy Scale: Zero Standing balance comment: unable to maintain upright without max A from clinician  Cognition Arousal/Alertness: Awake/alert Behavior During Therapy: WFL for tasks assessed/performed Overall Cognitive Status: Within Functional Limits for tasks assessed                                 General Comments: Pt with improved affect today, smiling, as son present during session.      Exercises Other Exercises Other Exercises: pt performed sitting balance activities edge of bed  and able to maintain static sitting with perturbations from multiple directions; required SBA for safety Other Exercises: pt peformed truncal strengthening from reclined position for improved truncal control Other Exercises: pt performed smooth pursuits, able to follow clinician's finger in H pattenr with no difficulty; performed single eye visual acuity activities with L and R eye; pt 1/10 to correctly identify how man fingers held up at varying distances from 12-24"    General Comments        Pertinent Vitals/Pain Pain Assessment: 0-10 Pain Score: 4  Pain Location: L heel during weightbearing Pain Descriptors / Indicators: Aching Pain Intervention(s): Monitored during session;Repositioned    Home Living                      Prior Function            PT Goals (current goals can now be found in the care plan section) Acute Rehab PT Goals Patient Stated Goal: to go home PT Goal Formulation: With patient Time For Goal Achievement: 04/25/20 Potential to Achieve Goals: Good Progress towards PT goals: Progressing toward goals    Frequency    7X/week      PT Plan Current plan remains appropriate    Co-evaluation              AM-PAC PT "6 Clicks" Mobility   Outcome Measure  Help needed turning from your back to your side while in a flat bed without using bedrails?: A Little Help needed moving from lying on your back to sitting on the side of a flat bed without using bedrails?: A Lot Help needed moving to and from a bed to a chair (including a wheelchair)?: A Lot Help needed standing up from a chair using your arms (e.g., wheelchair or bedside chair)?: A Lot Help needed to walk in hospital room?: Total Help needed climbing 3-5 steps with a railing? : Total 6 Click Score: 11    End of Session Equipment Utilized During Treatment: Gait belt Activity Tolerance: Patient limited by fatigue Patient left: in chair;with chair alarm set;with family/visitor  present;Other (comment) (PA present talking with pt/son; phone within reach) Nurse Communication: Mobility status;Other (comment) (NT notified of call bell not lit/active on bed) PT Visit Diagnosis: Unsteadiness on feet (R26.81);Other abnormalities of gait and mobility (R26.89);Muscle weakness (generalized) (M62.81);Other symptoms and signs involving the nervous system (R29.898);Hemiplegia and hemiparesis;Pain Hemiplegia - Right/Left: Left Hemiplegia - dominant/non-dominant: Non-dominant Hemiplegia - caused by: Cerebral infarction Pain - Right/Left: Left Pain - part of body:  (heel)     Time: 1062-6948 PT Time Calculation (min) (ACUTE ONLY): 29 min  Charges:                        Frederich Chick, SPT   Frederich Chick 04/12/2020, 11:59 AM

## 2020-04-12 NOTE — Progress Notes (Addendum)
Reason for consult:   Subjective:  Patient more alert today, gaze deviation has improved. Patietnt had episode of fever till 103.3 yesterday.    ROS: negative except above   Examination  Vital signs in last 24 hours: Temp:  [98.4 F (36.9 C)-103.3 F (39.6 C)] 98.4 F (36.9 C) (09/01 1357) Pulse Rate:  [84-98] 84 (09/01 1121) Resp:  [17-20] 17 (09/01 1121) BP: (118-164)/(57-84) 118/67 (09/01 1121) SpO2:  [96 %-100 %] 98 % (09/01 1121)  General: lying in bed CVS: pulse-normal rate and rhythm RS: breathing comfortably Extremities: normal   Neuro: MS: Alert and oriented, follows commands CN: pupils equal and reactive, left homonymous hemianopsia, right gaze preference, crossed midline, left facial droop, tongue midline, normal sensation over face, Motor: 5/5 strength in right upper and lower extremity, left upper extremity 1/5 with spasticity, left lower extremity 2/5 - brace placed.  Coordination: normal on right side Gait: not tested  Basic Metabolic Panel: Recent Labs  Lab 04/09/20 2131  NA 139  K 4.3  CL 102  CO2 28  GLUCOSE 148*  BUN 10  CREATININE 0.88  CALCIUM 9.6    CBC: Recent Labs  Lab 04/09/20 2131  WBC 8.3  NEUTROABS 7.1  HGB 15.8*  HCT 48.6*  MCV 107.5*  PLT 213     Coagulation Studies: No results for input(s): LABPROT, INR in the last 72 hours.     ASSESSMENT AND PLAN  69 y.o. 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 MRI brain confirms large right parietal and occipital lobe infarctions consistent with inferior division right MCA and PCA terrioty infarction-likely due to right PCA territory supplied by large posterior communicating artery.  Infarction is fairly large, however due to atrophy and  area right encephalomalacia there appears to be space for expansison and hence patient is low risk for developing malignant cerebral edema.  Etiology of stroke to be determined: Suspect right carotid disease vs embolic vs hypercoagulable state.   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 Fever   Recommendations # follow up remaining  hypercoagulable workup  # follow up blood cx #Transthoracic Echo : no LV thrombus, EF 60 to 65 %- recommend TEE and holter monitor # Start patient on ASA 81mg  daily # statin held due to transaminitis # BP goal: normotension # Telemetry monitoring # Frequent neuro checks  Cytotoxic cerebral edema -Low risk for developing malignant cerebral edema due to prior large right MCA stroke resulting in atrophy, repeat CT head did not show midline shift -Avoid hypotonic fluids -Frequent neurochecks    Labradford Schnitker [facility] Pager Number [phone] For questions after 7pm please refer to AMION to reach the Neurologist on call

## 2020-04-13 ENCOUNTER — Inpatient Hospital Stay: Payer: Medicare (Managed Care)

## 2020-04-13 LAB — BASIC METABOLIC PANEL
Anion gap: 11 (ref 5–15)
BUN: 9 mg/dL (ref 8–23)
CO2: 25 mmol/L (ref 22–32)
Calcium: 8.3 mg/dL — ABNORMAL LOW (ref 8.9–10.3)
Chloride: 95 mmol/L — ABNORMAL LOW (ref 98–111)
Creatinine, Ser: 0.81 mg/dL (ref 0.44–1.00)
GFR calc Af Amer: >60 mL/min (ref >60)
GFR calc non Af Amer: >60 mL/min (ref >60)
Glucose, Bld: 158 mg/dL — ABNORMAL HIGH (ref 70–99)
Potassium: 3.5 mmol/L (ref 3.5–5.1)
Sodium: 131 mmol/L — ABNORMAL LOW (ref 135–145)

## 2020-04-13 LAB — BLOOD CULTURE ID PANEL (REFLEXED) - BCID2

## 2020-04-13 LAB — ANCA TITERS
Atypical P-ANCA titer: <1:20 {titer}
C-ANCA: <1:20 {titer}
P-ANCA: <1:20 {titer}

## 2020-04-13 LAB — CBC
HCT: 40.9 % (ref 36.0–46.0)
Hemoglobin: 14.1 g/dL (ref 12.0–15.0)
MCH: 35.5 pg — ABNORMAL HIGH (ref 26.0–34.0)
MCHC: 34.5 g/dL (ref 30.0–36.0)
MCV: 103 fL — ABNORMAL HIGH (ref 80.0–100.0)
Platelets: 181 10*3/uL (ref 150–400)
RBC: 3.97 MIL/uL (ref 3.87–5.11)
RDW: 11.9 % (ref 11.5–15.5)
WBC: 7.7 10*3/uL (ref 4.0–10.5)
nRBC: 0 % (ref 0.0–0.2)

## 2020-04-13 LAB — PROTEIN C, TOTAL: Protein C, Total: 72 % (ref 60–150)

## 2020-04-13 MED ORDER — SODIUM CHLORIDE 0.9 % IV SOLN
3.0000 g | Freq: Four times a day (QID) | INTRAVENOUS | Status: DC
  Administered 2020-04-13 – 2020-04-18 (×17): 3 g via INTRAVENOUS
  Filled 2020-04-13: qty 3
  Filled 2020-04-13 (×3): qty 8
  Filled 2020-04-13: qty 3
  Filled 2020-04-13: qty 1
  Filled 2020-04-13: qty 8
  Filled 2020-04-13: qty 3
  Filled 2020-04-13: qty 1
  Filled 2020-04-13: qty 8
  Filled 2020-04-13: qty 1
  Filled 2020-04-13 (×5): qty 8
  Filled 2020-04-13 (×2): qty 1
  Filled 2020-04-13: qty 8
  Filled 2020-04-13: qty 3
  Filled 2020-04-13: qty 8
  Filled 2020-04-13: qty 1
  Filled 2020-04-13: qty 8

## 2020-04-13 NOTE — Progress Notes (Signed)
PROGRESS NOTE    Stephanie Mason   ZOX:[id number]  DOB: 09-10-1950  PCP: Patient, No Pcp Per    DOA: 04/09/2020 LOS: 3   Brief Narrative   Stephanie Mason is an 69 y.o. 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  She presented to the ED on evening of 04/09/20 after developing a severe R periorbital headache the day before, associated nausea, vomiting, slurred speech and blurry vision in R eye.  [facility] by [facility] whose nurse sent her to the ED.     ED Course:  Afebrile, hypertensive 156/85, tachypnea, HR 74.   BMP unremarkable other than glucose 148.  CBC showed hemoconcentration with Hbg 15.8 and macrocytosis MCV 107.5, no leukocytosis.    CT head and maxillofacial - Large area of encephalomalacia involving the right MCA territory with hypodensity involving the right posterior parietal lobe likely chronic or subacute infarct. If clinical concern remains would recommend MRI for further evaluation.  Findings suggestive of right maxillary sinusitis.  MRI brain - 1. Acute inferior division right MCA territory infarct. Acute right PCA territory infarct (which occurred even more recently based on the prior head CT). 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 2. Pre-existing right ACA and upper division MCA territory infarcts. 3. 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        Assessment & Plan   Active Problems:   Acute CVA (cerebrovascular accident) (HCC)   Stroke (cerebrum) (HCC)   Acute/subacute MCA and PCA stroke - etiology is to be determined.  She has history of two prior strokes.   Patient's for stroke was after starting oral contraceptive pills,  hypercoagulable work-up -ANA and lupus anticoagulant negative, normal protein CNS --2D echo within  normal limits, TEE and Zio were scheduled for 9/2 but canceled due to ongoing fever. --continue ASA 81 mg PO daily,  -Not placed on statin as she had significant transaminitis in the past per PCP at [facility]   fever of unknown etiology Temperature anywhere from 102-103  blood cultures negative from 9/1.  Started Unasyn per ID and repeated blood culture today on 9/2.  Chest x-ray concerning for infiltrate versus atelectasis and UA pending Left foot x-ray negative for acute pathology  Cytotoxin Cerebral Edema - per neurology and radiology (see H&P A/P for details), low risk for malignant cerebral edema or herniation.  Avoid hypotonic fluids.  Neuro checks as above.  Repeat CT head to eval for midline shift.  Macrocytosis - chronic.  Patient was followed by Dr. Donneta Romberg, he attributed this to "liver disease", pt denies hx of this however.  His note is available under MRN #[id number] are supposed to be merged. Further work-up there is an inpatient or outpatient as warranted.  History of seizure disorder - taken off her Keppra last year, no seizures since.  Left leg neuropathy/paresis - Continue gabapentin 600 qAM, 300 in afternoon   DVT prophylaxis: enoxaparin (LOVENOX) injection 40 mg Start: 04/10/20 1100   Diet:  Diet Orders (From admission, onward)    Start     Ordered   04/14/20 0001  Diet NPO time specified  Diet effective midnight        04/13/20 1214   04/13/20 0744  DIET DYS 3 Room service appropriate? Yes; Fluid consistency:  Thin  Diet effective now       Question Answer Comment  Room service appropriate? Yes   Fluid consistency: Thin      04/13/20 0743            Code Status: Full Code    Subjective 04/13/20   Feels tired, had fever of 102.8 this morning. Disposition Plan & Communication   Status is: Inpatient  Remains inpatient appropriate because:Ongoing diagnostic testing needed not appropriate for outpatient work up.  Requires close monitoring of  neurologic status given large stroke and now she has been running fever for last 2 days - pending infectious work-up.  D/C pending clearance by neurology and ID.   Dispo: The patient is from: Home              Anticipated d/c is to: Home              Anticipated d/c date is: 2-3 days              Patient currently is not medically stable to d/c.  Still febrile, pending TEE and loop monitoring   Family Communication: Family not at bedside today, patient updated   Consults, Procedures, Significant Events   Consultants:   Neurology  Procedures:  TEE and Zio on hold till fever subside  Antimicrobials:   Unasyn started 9/2  Objective   Vitals:   04/13/20 0755 04/13/20 0821 04/13/20 1101 04/13/20 1553  BP:  137/87 (!) 141/92 (!) 151/77  Pulse:  88 86 82  Resp:  19 18 18   Temp: 99.6 F (37.6 C) 100.1 F (37.8 C) 98.7 F (37.1 C) 99.1 F (37.3 C)  TempSrc: Oral Oral Oral Oral  SpO2:  96% 100% 100%  Weight:      Height:        Intake/Output Summary (Last 24 hours) at 04/13/2020 1943 Last data filed at 04/13/2020 1846 Gross per 24 hour  Intake 340 ml  Output 900 ml  Net -560 ml   Filed Weights   04/10/20 1058 04/10/20 2342 04/11/20 0427  Weight: 66.7 kg 73.4 kg 73.4 kg    Physical Exam:  General exam: awake , no acute distress Respiratory system: CTAB, no wheezes, rales or rhonchi, normal respiratory effort. Cardiovascular system: normal S1/S2, RRR, no pedal edema.   Gastrointestinal system: soft, NT, ND Central nervous system: Alert, left homonymous hemianopsia, left facial droop, 5 out of 5 strength on the right.  Left upper extremity 1 out of 5 with spasticity in left lower extremity 2 out of 5 with brace in place Psychiatry: normal mood, congruent affect, judgement and insight appear normal  Labs   Data Reviewed: I have personally reviewed following labs and imaging studies  CBC: Recent Labs  Lab 04/09/20 2131 04/13/20 0443  WBC 8.3 7.7  NEUTROABS 7.1   --   HGB 15.8* 14.1  HCT 48.6* 40.9  MCV 107.5* 103.0*  PLT 213 181   Basic Metabolic Panel: Recent Labs  Lab 04/09/20 2131 04/13/20 0443  NA 139 131*  K 4.3 3.5  CL 102 95*  CO2 28 25  GLUCOSE 148* 158*  BUN 10 9  CREATININE 0.88 0.81  CALCIUM 9.6 8.3*   GFR: Estimated Creatinine Clearance: 66.4 mL/min (by C-G formula based on SCr of 0.81 mg/dL). Liver Function Tests: No results for input(s): AST, ALT, ALKPHOS, BILITOT, PROT, ALBUMIN in the last 168 hours. No results for input(s): LIPASE, AMYLASE in the last 168 hours. No results for input(s): AMMONIA  in the last 168 hours. Coagulation Profile: No results for input(s): INR, PROTIME in the last 168 hours. Cardiac Enzymes: No results for input(s): CKTOTAL, CKMB, CKMBINDEX, TROPONINI in the last 168 hours. BNP (last 3 results) No results for input(s): PROBNP in the last 8760 hours. HbA1C: Recent Labs    04/11/20 0833  HGBA1C 5.5   CBG: No results for input(s): GLUCAP in the last 168 hours. Lipid Profile: Recent Labs    04/11/20 0833  CHOL 150  HDL 60  LDLCALC 80  TRIG 50  CHOLHDL 2.5   Thyroid Function Tests: No results for input(s): TSH, T4TOTAL, FREET4, T3FREE, THYROIDAB in the last 72 hours. Anemia Panel: No results for input(s): VITAMINB12, FOLATE, FERRITIN, TIBC, IRON, RETICCTPCT in the last 72 hours. Sepsis Labs: No results for input(s): PROCALCITON, LATICACIDVEN in the last 168 hours.  Recent Results (from the past 240 hour(s))  SARS Coronavirus 2 by RT PCR (hospital order, performed in [facility] hospital lab) Nasopharyngeal Nasopharyngeal Swab     Status: None   Collection Time: 04/10/20 10:58 AM   Specimen: Nasopharyngeal Swab  Result Value Ref Range Status   SARS Coronavirus 2 NEGATIVE NEGATIVE Final    Comment: (NOTE) SARS-CoV-2 target nucleic acids are NOT DETECTED.  The SARS-CoV-2 RNA is generally detectable in upper and lower respiratory specimens during the acute phase of infection.  The lowest concentration of SARS-CoV-2 viral copies this assay can detect is 250 copies / mL. A negative result does not preclude SARS-CoV-2 infection and should not be used as the sole basis for treatment or other patient management decisions.  A negative result may occur with improper specimen collection / handling, submission of specimen other than nasopharyngeal swab, presence of viral mutation(s) within the areas targeted by this assay, and inadequate number of viral copies (<250 copies / mL). A negative result must be combined with clinical observations, patient history, and epidemiological information.  Fact Sheet for Patients:   BoilerBrush.com.cy  Fact Sheet for Healthcare Providers: https://pope.com/  This test is not yet approved or  cleared by the [place] FDA and has been authorized for detection and/or diagnosis of SARS-CoV-2 by FDA under an Emergency Use Authorization (EUA).  This EUA will remain in effect (meaning this test can be used) for the duration of the COVID-19 declaration under Section 564(b)(1) of the Act, 21 U.S.C. section 360bbb-3(b)(1), unless the authorization is terminated or revoked sooner.  Performed at [facility], [address] [place], [place] [address]   Culture, blood (Routine X 2) w Reflex to ID Panel     Status: None (Preliminary result)   Collection Time: 04/12/20 10:43 AM   Specimen: BLOOD  Result Value Ref Range Status   Specimen Description BLOOD BACK OF LEFT HAND  Final   Special Requests   Final    BOTTLES DRAWN AEROBIC AND ANAEROBIC Blood Culture adequate volume   Culture   Final    NO GROWTH < 24 HOURS Performed at [facility], [address], [place], [place] [address]    Report Status PENDING  Incomplete  Culture, blood (Routine X 2) w Reflex to ID Panel     Status: None (Preliminary result)   Collection Time: 04/12/20 12:47 PM   Specimen: BLOOD    Result Value Ref Range Status   Specimen Description BLOOD BLOOD LEFT WRIST  Final   Special Requests   Final    BOTTLES DRAWN AEROBIC AND ANAEROBIC Blood Culture adequate volume   Culture  Final    NO GROWTH < 24 HOURS Performed at [facility], [address], [place], [place] [address]    Report Status PENDING  Incomplete      Imaging Studies   DG Chest [facility] 1 View  Result Date: 04/13/2020 CLINICAL DATA:  Fever. Left foot pain. History of diabetes and hypertension. EXAM: PORTABLE CHEST 1 VIEW COMPARISON:  09/15/2012 FINDINGS: Mild hazy opacity is noted at the lung bases, likely atelectasis. Pneumonia is possible. Remainder of the lungs is clear. No convincing pleural effusion.  No pneumothorax. Cardiac silhouette is normal in size. No mediastinal or hilar masses. Skeletal structures are grossly intact. IMPRESSION: 1. Hazy lung base opacities most likely due to atelectasis. Pneumonia is possible. No other evidence of active cardiopulmonary disease. Electronically Signed   By: Amie Portland M.D.   On: 04/13/2020 16:51   DG Foot 2 Views Left  Result Date: 04/13/2020 CLINICAL DATA:  Fever and left foot pain.  History of diabetes. EXAM: LEFT FOOT - 2 VIEW COMPARISON:  None. FINDINGS: No fracture or bone lesion. There is no bone resorption to suggest osteomyelitis. 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 Small plantar calcaneal spur. Tissues are unremarkable. IMPRESSION: 1. No fracture.  No evidence of osteomyelitis. Electronically Signed   By: Amie Portland M.D.   On: 04/13/2020 16:53     Medications   Scheduled Meds: .  stroke: mapping our early stages of recovery book   Does not apply Once  .  stroke: mapping our early stages of recovery book   Does not apply Once  . aspirin EC  81 mg Oral Daily  . enoxaparin (LOVENOX) injection  40 mg Subcutaneous Q24H  . fluticasone  1 spray Each Nare Daily  . gabapentin  200 mg Oral q AM  . gabapentin  400 mg Oral QHS  .  senna  1 tablet Oral QHS  . sodium chloride flush  10-40 mL Intracatheter Q12H   Continuous Infusions: . ampicillin-sulbactam (UNASYN) IV 3 g (04/13/20 1624)       LOS: 3 days    Time spent: 30 minutes with > 50% spent in coordination of care and direct patient contact    Estel Scholze Sherryll Burger, DO [facility]  04/13/2020, 7:43 PM    If 7PM-7AM, please contact night-coverage. How to contact the [facility] Attending or Consulting provider 7A - 7P or covering provider during after hours 7P -7A, for this patient?    1. Check the care team in [facility] and look for a) attending/consulting [facility] provider listed and b) the [facility] team listed 2. Log into [email or web address] and use [facility] universal password to access. If you do not have the password, please contact the hospital operator. 3. Locate the [facility] provider you are looking for under [facility] and page to a number that you can be directly reached. 4. If you still have difficulty reaching the provider, please page the [facility] (Director on Call) for the Hospitalists listed on amion for assistance.

## 2020-04-13 NOTE — Progress Notes (Signed)
PHARMACY - PHYSICIAN COMMUNICATION CRITICAL VALUE ALERT - BLOOD CULTURE IDENTIFICATION (BCID)  Stephanie Mason is an 69 y.o. female who presented to [facility] on 04/09/2020 with a chief complaint of Fever of unknown origin.   Assessment:  Staph species growing in 1 of 4 bottles.  Most likely this is a contaminant.  (include suspected source if known)  Name of physician (or Provider) Contacted: Ouma, NP   Current antibiotics: Unasyn 3 gm IV Q6H   Changes to prescribed antibiotics recommended:  Patient is on recommended antibiotics - No changes needed  Will wait until full report on blood cx.   Results for orders placed or performed during the hospital encounter of 04/09/20  Blood Culture ID Panel (Reflexed) (Collected: 04/12/2020 10:43 AM)  Result Value Ref Range   Enterococcus faecalis NOT DETECTED NOT DETECTED   Enterococcus Faecium NOT DETECTED NOT DETECTED   Listeria monocytogenes NOT DETECTED NOT DETECTED   Staphylococcus species DETECTED (A) NOT DETECTED   Staphylococcus aureus (BCID) NOT DETECTED NOT DETECTED   Staphylococcus epidermidis NOT DETECTED NOT DETECTED   Staphylococcus lugdunensis NOT DETECTED NOT DETECTED   Streptococcus species NOT DETECTED NOT DETECTED   Streptococcus agalactiae NOT DETECTED NOT DETECTED   Streptococcus pneumoniae NOT DETECTED NOT DETECTED   Streptococcus pyogenes NOT DETECTED NOT DETECTED   A.calcoaceticus-baumannii NOT DETECTED NOT DETECTED   Bacteroides fragilis NOT DETECTED NOT DETECTED   Enterobacterales NOT DETECTED NOT DETECTED   Enterobacter cloacae complex NOT DETECTED NOT DETECTED   Escherichia coli NOT DETECTED NOT DETECTED   Klebsiella aerogenes NOT DETECTED NOT DETECTED   Klebsiella oxytoca NOT DETECTED NOT DETECTED   Klebsiella pneumoniae NOT DETECTED NOT DETECTED   Proteus species NOT DETECTED NOT DETECTED   Salmonella species NOT DETECTED NOT DETECTED   Serratia marcescens NOT DETECTED NOT DETECTED   Haemophilus influenzae  NOT DETECTED NOT DETECTED   Neisseria meningitidis NOT DETECTED NOT DETECTED   Pseudomonas aeruginosa NOT DETECTED NOT DETECTED   Stenotrophomonas maltophilia NOT DETECTED NOT DETECTED   Candida albicans NOT DETECTED NOT DETECTED   Candida auris NOT DETECTED NOT DETECTED   Candida glabrata NOT DETECTED NOT DETECTED   Candida krusei NOT DETECTED NOT DETECTED   Candida parapsilosis NOT DETECTED NOT DETECTED   Candida tropicalis NOT DETECTED NOT DETECTED   Cryptococcus neoformans/gattii NOT DETECTED NOT DETECTED    Ripken Rekowski D 04/13/2020  10:19 PM

## 2020-04-13 NOTE — Progress Notes (Signed)
    TEE postponed today secondary to fever of 102.7. Will reassess 9/3.

## 2020-04-13 NOTE — Progress Notes (Signed)
OT Cancellation Note  Patient Details Name: Stephanie Mason MRN: [id number] DOB: Mar 06, 1951   Cancelled Treatment:    Reason Eval/Treat Not Completed: Medical issues which prohibited therapy. Pt has had ongoing fever today. OT will hold until pt is more medically able to participate.   Jackquline Denmark, MS, OTR/L , CBIS ascom [phone]  04/13/20, 1:42 PM   04/13/2020, 1:41 PM

## 2020-04-13 NOTE — Progress Notes (Signed)
Physical Therapy Treatment Patient Details Name: Stephanie Mason MRN: [id number] DOB: Jan 10, 1951 Today's Date: 04/13/2020    History of Present Illness Stephanie Mason is a 69 y.o. 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 Patient was in her usual state of health until yesterday when she developed onset of right periorbital headache, slurred speech, and blurry vision in her right eye. 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    PT Comments    Pt lying in bed with boyfriend Stephanie Mason at bedside. Pt reports feeling slightly better from this morning however defers upright or OOB activity this session. Pt is agreeable to perform exercises at bed level. Pt performed rolling in bed with mod-max A for initiation of movement and coming into sidelying position. Pt performed open and closed chain BLE activities to promote strengthening for progression towards improved bed mobility, pressure relief, and transfers. Mod A required for LLE activities secondary to weakness and decreased motor control from residual stroke. Pt also participated in visual scanning/object finding with follow up reaching to touch object. Pt required increased time and movement of object for pt to find. Pt remained sleepy throughout session and required continued verbal and tactile stimulation to remain engaged.    Follow Up Recommendations  Home health PT;Supervision for mobility/OOB;Other (comment) ([facility])     Equipment Recommendations  None recommended by PT    Recommendations for Other Services       Precautions / Restrictions Precautions Precautions: Fall Required Braces or Orthoses: Other Brace Other Brace: L AFO Restrictions Weight Bearing Restrictions: No    Mobility  Bed Mobility Overal bed mobility: Needs Assistance Bed  Mobility: Rolling Rolling: Max assist         General bed mobility comments: Mod-max A for rolling to L for initiating and coming into sidelying position; manual assistance for placing R foot and R hand with verbal cues to pull through RUE and push through RLE; max A for rolling into partial R sidelying  Transfers                 General transfer comment: pt deferred today  Ambulation/Gait             General Gait Details: deferred   Stairs             Wheelchair Mobility    Modified Rankin (Stroke Patients Only)       Balance       Sitting balance - Comments: deferred       Standing balance comment: deferred                            Cognition Arousal/Alertness: Lethargic Behavior During Therapy: WFL for tasks assessed/performed Overall Cognitive Status: Within Functional Limits for tasks assessed                                 General Comments: Pt very sleepy throughout session requiring continued verbal and tactile cues for stimulation; pt's boyfriend Stephanie Mason was present and pt playful with him, sticking out her tongue at him and smiling      Exercises Other Exercises Other Exercises: pt performed BLE hip ab/add x 10, with mod A for LLE; pt performed visual scanning  and reaching for object with RUE Other Exercises: BLE leg presses x 20 and bridges x 15; pt unable to completely clear hips at this time, assist needed to support feet    General Comments        Pertinent Vitals/Pain Pain Assessment: Faces Faces Pain Scale: Hurts even more Pain Location: occasional pain in L anterior knee Pain Descriptors / Indicators: Aching Pain Intervention(s): Monitored during session;Repositioned    Home Living                      Prior Function            PT Goals (current goals can now be found in the care plan section) Acute Rehab PT Goals Patient Stated Goal: to go home PT Goal Formulation: With  patient Time For Goal Achievement: 04/25/20 Potential to Achieve Goals: Good Progress towards PT goals: Progressing toward goals    Frequency    7X/week      PT Plan Current plan remains appropriate    Co-evaluation              AM-PAC PT "6 Clicks" Mobility   Outcome Measure  Help needed turning from your back to your side while in a flat bed without using bedrails?: A Lot Help needed moving from lying on your back to sitting on the side of a flat bed without using bedrails?: A Lot Help needed moving to and from a bed to a chair (including a wheelchair)?: A Lot Help needed standing up from a chair using your arms (e.g., wheelchair or bedside chair)?: A Lot Help needed to walk in hospital room?: Total Help needed climbing 3-5 steps with a railing? : Total 6 Click Score: 10    End of Session   Activity Tolerance: Patient limited by lethargy Patient left: in bed;with call bell/phone within reach;with bed alarm set;with family/visitor present;with nursing/sitter in room Nurse Communication: Mobility status;Other (comment) PT Visit Diagnosis: Unsteadiness on feet (R26.81);Other abnormalities of gait and mobility (R26.89);Muscle weakness (generalized) (M62.81);Other symptoms and signs involving the nervous system (R29.898);Hemiplegia and hemiparesis;Pain Hemiplegia - Right/Left: Left Hemiplegia - dominant/non-dominant: Non-dominant Hemiplegia - caused by: Cerebral infarction Pain - Right/Left: Left Pain - part of body: Knee     Time: 2831-5176 PT Time Calculation (min) (ACUTE ONLY): 26 min  Charges:                        Frederich Chick, SPT   Frederich Chick 04/13/2020, 4:12 PM

## 2020-04-13 NOTE — Consult Note (Signed)
Infectious Disease     Reason for Consult: Fever   Referring Physician: Dr Manuella Ghazi Date of Admission:  04/09/2020   Active Problems:   Acute CVA (cerebrovascular accident) [facility])   Stroke (cerebrum) ([place])   HPI: Stephanie Mason is a 69 y.o. female with hx of DM, HTN, prior strokes admitted with new HA, and found to have acute CVA on MRI. Was also starting to have fevers as inpatient. Pt is a poor historian but denies prodromal sxs, no fevers, ns, wt prior to the HA. No new joint pain or swelling, no recent SSTI, no recent dental issues. She denies any cough, aspiration events or swallowing difficulty. 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 BCX have beennegative CT head shows R max sinusitis. PT does report pain over R side of face.  Past Medical History:  Diagnosis Date  . Diabetes mellitus without complication ([place])   . Hypertension   . Stroke [facility])    History reviewed. No pertinent surgical history. Social History   Tobacco Use  . Smoking status: Never Smoker  . Smokeless tobacco: Never Used  Substance Use Topics  . Alcohol use: Never  . Drug use: Never   No family history on file.  Allergies: No Known Allergies  Current antibiotics: Antibiotics Given (last 72 hours)    None      MEDICATIONS: .  stroke: mapping our early stages of recovery book   Does not apply Once  .  stroke: mapping our early stages of recovery book   Does not apply Once  . aspirin EC  81 mg Oral Daily  . enoxaparin (LOVENOX) injection  40 mg Subcutaneous Q24H  . fluticasone  1 spray Each Nare Daily  . gabapentin  200 mg Oral q AM  . gabapentin  400 mg Oral QHS  . senna  1 tablet Oral QHS  . sodium chloride flush  10-40 mL Intracatheter Q12H    Review of Systems - 11 systems reviewed and negative per HPI   OBJECTIVE: Temp:  [98.4 F (36.9 C)-102.8 F (39.3 C)] 98.7 F (37.1 C) (09/02  1101) Pulse Rate:  [86-96] 86 (09/02 1101) Resp:  [16-20] 18 (09/02 1101) BP: (131-144)/(64-92) 141/92 (09/02 1101) SpO2:  [96 %-100 %] 100 % (09/02 1101) Physical Exam  Constitutional:  Pleasant interactive, chronically ill appearing HENT:mild TTP over frontal and max sinuses Mouth/Throat: Oropharynx is clear and moist. No oropharyngeal exudate.  Cardiovascular: Normal rate, regular rhythm and normal heart sounds.  Pulmonary/Chest: Effort normal and breath sounds normal. No respiratory distress.  has no wheezes.  Neck = supple, no nuchal rigidity Abdominal: Soft. Bowel sounds are normal.  exhibits no distension. There is no tenderness.  Lymphadenopathy: no cervical adenopathy. No axillary adenopathy Neurological: L hemiparesis, L foot in brace. + clonus and myoclonus on L leg Mild TTP over dorsum of L foot but seems to be cramping as well  Skin: Skin is warm and dry. No rash noted. No erythema.  Psychiatric: a normal mood and affect.  behavior is normal.    LABS: Results for orders placed or performed during the hospital encounter of 04/09/20 (from the past 48 hour(s))  Protein C activity     Status: None   Collection Time: 04/11/20  3:14 PM  Result Value Ref Range   Protein C Activity 98 73 - 180 %  Comment: (NOTE) Performed At: [facility] [place], [place] [id number] Rush Farmer MD HQ:[id number]   Protein C, total     Status: None   Collection Time: 04/11/20  3:14 PM  Result Value Ref Range   Protein C, Total 72 60 - 150 %    Comment: (NOTE) Performed At: [facility] [place], [place] [id number] Rush Farmer MD VO:[id number]   Protein S activity     Status: None   Collection Time: 04/11/20  3:14 PM  Result Value Ref Range   Protein S Activity 63 63 - 140 %    Comment: (NOTE) Protein S activity may be falsely increased (masking an abnormal, low result) in patients receiving direct Xa inhibitor (e.g., rivaroxaban,  apixaban, edoxaban) or a direct thrombin inhibitor (e.g., dabigatran) anticoagulant treatment due to assay interference by these drugs. Performed At: [facility] [place], [place] [id number] Rush Farmer MD FI:[id number]   Protein S, total     Status: None   Collection Time: 04/11/20  3:14 PM  Result Value Ref Range   Protein S Ag, Total 93 60 - 150 %    Comment: (NOTE) This test was developed and its performance characteristics determined by [facility]. It has not been cleared or approved by the Food and Drug Administration. Performed At: [facility] [place], [place] [id number] Rush Farmer MD WF:[id number]   Lupus anticoagulant panel     Status: None   Collection Time: 04/11/20  3:14 PM  Result Value Ref Range   PTT Lupus Anticoagulant 33.7 0.0 - 51.9 sec   DRVVT 34.9 0.0 - 47.0 sec   Lupus Anticoag Interp Comment:     Comment: (NOTE) No lupus anticoagulant was detected. Performed At: [facility] [place], [place] [id number] Rush Farmer MD CB:[id number]   Beta-2-glycoprotein i abs, IgG/M/A     Status: None   Collection Time: 04/11/20  3:14 PM  Result Value Ref Range   Beta-2 Glyco I IgG <9 0 - 20 GPI IgG units    Comment: (NOTE) The reference interval reflects a 3SD or 99th percentile interval, which is thought to represent a potentially clinically significant result in accordance with the International Consensus Statement on the classification criteria for definitive antiphospholipid syndrome (APS). J Thromb Haem 2006;4:295-306.    Beta-2-Glycoprotein I IgM <9 0 - 32 GPI IgM units    Comment: (NOTE) The reference interval reflects a 3SD or 99th percentile interval, which is thought to represent a potentially clinically significant result in accordance with the International Consensus Statement on the classification criteria for definitive antiphospholipid syndrome (APS). J Thromb Haem  2006;4:295-306. Performed At: [facility] [place], [place] [id number] Rush Farmer MD YI:[id number]    Beta-2-Glycoprotein I IgA <9 0 - 25 GPI IgA units    Comment: (NOTE) The reference interval reflects a 3SD or 99th percentile interval, which is thought to represent a potentially clinically significant result in accordance with the International Consensus Statement on the classification criteria for definitive antiphospholipid syndrome (APS). J Thromb Haem 2006;4:295-306.   Homocysteine, serum     Status: None   Collection Time: 04/11/20  3:14 PM  Result Value Ref Range   Homocysteine 11.1 0.0 - 17.2 umol/L    Comment: (NOTE) Performed At: [facility] [place], [place] [id number] Rush Farmer MD XH:[id number]   Cardiolipin antibodies, IgG, IgM, IgA     Status: None   Collection Time:  04/11/20  3:14 PM  Result Value Ref Range   Anticardiolipin IgG <9 0 - 14 GPL U/mL    Comment: (NOTE)                          Negative:              <15                          Indeterminate:     15 - 20                          Low-Med Positive: >20 - 80                          High Positive:         >80    Anticardiolipin IgM 9 0 - 12 MPL U/mL    Comment: (NOTE)                          Negative:              <13                          Indeterminate:     13 - 20                          Low-Med Positive: >20 - 80                          High Positive:         >80    Anticardiolipin IgA <9 0 - 11 APL U/mL    Comment: (NOTE)                          Negative:              <12                          Indeterminate:     12 - 20                          Low-Med Positive: >20 - 80                          High Positive:         >80 Performed At: [facility] [place], [place] [id number] Rush Farmer MD YD:[id number]   ANA     Status: None   Collection Time: 04/11/20  3:14 PM  Result Value Ref Range   Anti  Nuclear Antibody (ANA) Negative Negative    Comment: (NOTE) Performed At: [facility] [place], [place] [id number] Rush Farmer MD GE:[id number]   Antiextractable Nuclear Ag     Status: None   Collection Time: 04/11/20  3:14 PM  Result Value Ref Range   Ribonucleic Protein 0.2 0.0 - 0.9 AI   ENA SM Ab Ser-aCnc <0.2 0.0 - 0.9 AI    Comment: (NOTE) Performed At: [facility] [address] [place], [place] [id number] Perlie Gold  Derinda Late MD IR:[id number]   HIV Antibody (routine testing w rflx)     Status: None   Collection Time: 04/12/20  7:46 AM  Result Value Ref Range   HIV Screen 4th Generation wRfx Non Reactive Non Reactive    Comment: Performed at [facility], [place] [address] [place], [place] [address]  Culture, blood (Routine X 2) w Reflex to ID Panel     Status: None (Preliminary result)   Collection Time: 04/12/20 10:43 AM   Specimen: BLOOD  Result Value Ref Range   Specimen Description BLOOD BACK OF LEFT HAND    Special Requests      BOTTLES DRAWN AEROBIC AND ANAEROBIC Blood Culture adequate volume   Culture      NO GROWTH < 24 HOURS Performed at [facility], [address], [place], [place] [address]    Report Status PENDING   Culture, blood (Routine X 2) w Reflex to ID Panel     Status: None (Preliminary result)   Collection Time: 04/12/20 12:47 PM   Specimen: BLOOD  Result Value Ref Range   Specimen Description BLOOD BLOOD LEFT WRIST    Special Requests      BOTTLES DRAWN AEROBIC AND ANAEROBIC Blood Culture adequate volume   Culture      NO GROWTH < 24 HOURS Performed at [facility], [address] [place], [place] [address]    Report Status PENDING   Antithrombin III     Status: None   Collection Time: 04/12/20  2:49 PM  Result Value Ref Range   AntiThromb III Func 101 75 - 120 %    Comment: Performed at [facility], [place] [address] [place], [place] [address]  CBC     Status: Abnormal    Collection Time: 04/13/20  4:43 AM  Result Value Ref Range   WBC 7.7 4.0 - 10.5 K/uL   RBC 3.97 3.87 - 5.11 MIL/uL   Hemoglobin 14.1 12.0 - 15.0 g/dL   HCT 40.9 36 - 46 %   MCV 103.0 (H) 80.0 - 100.0 fL   MCH 35.5 (H) 26.0 - 34.0 pg   MCHC 34.5 30.0 - 36.0 g/dL   RDW 11.9 11.5 - 15.5 %   Platelets 181 150 - 400 K/uL   nRBC 0.0 0.0 - 0.2 %    Comment: Performed at [facility], [address], [place], [place] [address]  Basic metabolic panel     Status: Abnormal   Collection Time: 04/13/20  4:43 AM  Result Value Ref Range   Sodium 131 (L) 135 - 145 mmol/L   Potassium 3.5 3.5 - 5.1 mmol/L   Chloride 95 (L) 98 - 111 mmol/L   CO2 25 22 - 32 mmol/L   Glucose, Bld 158 (H) 70 - 99 mg/dL    Comment: Glucose reference range applies only to samples taken after fasting for at least 8 hours.   BUN 9 8 - 23 mg/dL   Creatinine, Ser 0.81 0.44 - 1.00 mg/dL   Calcium 8.3 (L) 8.9 - 10.3 mg/dL   GFR calc non Af Amer >60 >60 mL/min   GFR calc Af Amer >60 >60 mL/min   Anion gap 11 5 - 15    Comment: Performed at [facility], [address] [place], [place] [address]   No components found for: ESR, C REACTIVE PROTEIN MICRO: Recent Results (from the past 720 hour(s))  SARS Coronavirus 2 by RT PCR (hospital order, performed in  [facility] hospital lab) Nasopharyngeal Nasopharyngeal Swab     Status: None   Collection Time: 04/10/20 10:58 AM   Specimen: Nasopharyngeal Swab  Result Value Ref Range Status   SARS Coronavirus 2 NEGATIVE NEGATIVE Final    Comment: (NOTE) SARS-CoV-2 target nucleic acids are NOT DETECTED.  The SARS-CoV-2 RNA is generally detectable in upper and lower respiratory specimens during the acute phase of infection. The lowest concentration of SARS-CoV-2 viral copies this assay can detect is 250 copies / mL. A negative result does not preclude SARS-CoV-2 infection and should not be used as the sole basis for treatment or other patient management  decisions.  A negative result may occur with improper specimen collection / handling, submission of specimen other than nasopharyngeal swab, presence of viral mutation(s) within the areas targeted by this assay, and inadequate number of viral copies (<250 copies / mL). A negative result must be combined with clinical observations, patient history, and epidemiological information.  Fact Sheet for Patients:   StrictlyIdeas.no  Fact Sheet for Healthcare Providers: BankingDealers.co.za  This test is not yet approved or  cleared by the [place] FDA and has been authorized for detection and/or diagnosis of SARS-CoV-2 by FDA under an Emergency Use Authorization (EUA).  This EUA will remain in effect (meaning this test can be used) for the duration of the COVID-19 declaration under Section 564(b)(1) of the Act, 21 U.S.C. section 360bbb-3(b)(1), unless the authorization is terminated or revoked sooner.  Performed at [facility], [address] [place], [place] [address]   Culture, blood (Routine X 2) w Reflex to ID Panel     Status: None (Preliminary result)   Collection Time: 04/12/20 10:43 AM   Specimen: BLOOD  Result Value Ref Range Status   Specimen Description BLOOD BACK OF LEFT HAND  Final   Special Requests   Final    BOTTLES DRAWN AEROBIC AND ANAEROBIC Blood Culture adequate volume   Culture   Final    NO GROWTH < 24 HOURS Performed at [facility], [address], [place], [place] [address]    Report Status PENDING  Incomplete  Culture, blood (Routine X 2) w Reflex to ID Panel     Status: None (Preliminary result)   Collection Time: 04/12/20 12:47 PM   Specimen: BLOOD  Result Value Ref Range Status   Specimen Description BLOOD BLOOD LEFT WRIST  Final   Special Requests   Final    BOTTLES DRAWN AEROBIC AND ANAEROBIC Blood Culture adequate volume   Culture   Final    NO GROWTH < 24 HOURS Performed at  [facility], [address], [place], [place] [address]    Report Status PENDING  Incomplete    IMAGING: CT ANGIO HEAD W OR WO CONTRAST  Addendum Date: 04/10/2020   ADDENDUM REPORT: 04/10/2020 18:09 ADDENDUM: These results were called by telephone at the time of interpretation on 04/10/2020 at 6:09 pm to provider Dr. Hollice Espy , who verbally acknowledged these results. Electronically Signed   By: Kellie Simmering DO   On: 04/10/2020 18:09   Result Date: 04/10/2020 CLINICAL DATA:  Neuro deficit, acute stroke suspected. EXAM: CT ANGIOGRAPHY HEAD AND NECK TECHNIQUE: Multidetector CT imaging of the head and neck was performed using the standard protocol during bolus administration of intravenous contrast. Multiplanar CT image reconstructions and MIPs were obtained to evaluate the vascular anatomy. Carotid stenosis measurements (when applicable) are obtained utilizing NASCET criteria, using the distal internal carotid diameter as the denominator.  CONTRAST:  83m OMNIPAQUE IOHEXOL 350 MG/ML SOLN COMPARISON:  Brain MRI 04/10/2020, head CT 04/09/2020, MRA head 09/15/2012. FINDINGS: CT HEAD FINDINGS Brain: Again demonstrated is extensive acute on chronic infarcts within the right cerebral hemisphere with acute infarcts affecting the right MCA and PCA vascular territories. Similar degree of mass effect. Associated ex vacuo dilatation of the frontal horn of the right lateral ventricle. No midline shift. No evidence of hemorrhagic conversion. No extra-axial fluid collection. No evidence of intracranial mass. Vascular: Reported below. Skull: Normal. Negative for fracture or focal lesion. Sinuses: Extensive partial opacification of the right maxillary sinus. No significant mastoid effusion. Orbits: No acute abnormality. Review of the MIP images confirms the above findings CTA NECK FINDINGS Aortic arch: Common origin of the innominate and left common carotid arteries. The visualized aortic arch is unremarkable.  No hemodynamically significant innominate or proximal subclavian artery stenosis. Right carotid system: The CCA is patent to the bifurcation without significant stenosis. Soft tissue plaque within the ICA bulb without hemodynamically significant stenosis (50% or greater). Distal to this, there is a markedly diminutive appearance of the cervical ICA. The intracranial left ICA becomes occluded at the cavernous segment and remains occluded to the terminus. Left carotid system: CCA and ICA patent within the neck without hemodynamically significant stenosis (50% or greater). Mild soft plaque within the carotid bifurcation and proximal ICA. Vertebral arteries: The vertebral arteries are patent within the neck bilaterally without hemodynamically significant stenosis. The left vertebral artery is dominant. Skeleton: No acute bony abnormality or aggressive osseous lesion. Other neck: No neck mass or cervical lymphadenopathy. Upper chest: No consolidation within the imaged lung apices. Review of the MIP images confirms the above findings CTA HEAD FINDINGS Anterior circulation: The pre cavernous right ICA is severely stenotic. There is occlusion of the right ICA beginning at the cavernous segment and this vessel remains occluded through the ICA terminus. There is non opacification of the right middle cerebral artery vessels. Additionally, there is non opacification of the A1 right anterior cerebral artery. The A2 and more distal right anterior cerebral artery is markedly diminutive with multifocal high-grade stenoses throughout. The intracranial left ICA is patent without significant stenosis. The M1 left middle cerebral artery is patent without significant stenosis. No left M2 proximal branch occlusion or high-grade proximal stenosis is identified. The left anterior cerebral artery is patent without high-grade proximal stenosis. There is mild fusiform dilation of the supraclinoid left ICA and left ICA terminus which may  reflect an aneurysm. Posterior circulation: The non dominant intracranial right vertebral artery is developmentally diminutive, but patent and terminates as the right PICA. The dominant intracranial left vertebral artery is patent without significant stenosis, as is the basilar artery. The right posterior cerebral artery appears predominantly fetal in origin and the right posterior communicating artery appears occluded. There is flow within a hypoplastic right P1 segment and within the P2 and more distal right posterior cerebral artery. Fetal origin left posterior cerebral artery which is patent without significant proximal stenosis. Venous sinuses: Within limitations of contrast timing, no convincing thrombus. Anatomic variants: None significant Review of the MIP images confirms the above findings IMPRESSION: CT head: Redemonstrated extensive acute on chronic infarcts within the right cerebral hemisphere with acute infarcts affecting the right MCA and PCA vascular territories. No evidence of hemorrhagic conversion. Similar degree of mass effect. No midline shift. CTA neck: 1. There is soft plaque within the right carotid bifurcation/ICA bulb without hemodynamically significant stenosis. However, distal to this, the right cervical ICA  is markedly asymmetrically diminutive. 2. The left common carotid artery, cervical internal carotid artery and bilateral vertebral arteries are patent within the neck without hemodynamically significant stenosis. CTA head: 1. 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 The right ICA becomes occluded at the cavernous segment and remains occluded through the supraclinoid segment. There is complete non opacification of the right middle cerebral arteries. Additionally, there is non opacification of the A1 right anterior cerebral artery. The A2 and more distal right anterior cerebral artery is markedly diminutive with multifocal high-grade stenoses. There is a fetal  origin right posterior cerebral artery with occluded right posterior communicating artery. There is reconstitution of flow within the P2 and more distal right PCA. 2. 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 Electronically Signed: By: Kellie Simmering DO On: 04/10/2020 17:51   CT HEAD WO CONTRAST  Result Date: 04/11/2020 CLINICAL DATA:  Follow-up stroke. EXAM: CT HEAD WITHOUT CONTRAST TECHNIQUE: Contiguous axial images were obtained from the base of the skull through the vertex without intravenous contrast. COMPARISON:  CT and MR studies done yesterday. FINDINGS: Brain: Acute infarction throughout the remainder of the right hemisphere as shown by studies yesterday. Old infarction in the right frontal region. The area of acute infarction shows mild swelling but there is no midline shift. No evidence of hemorrhage. No acute stroke seen affecting the left hemisphere, the brainstem or the cerebellum. Vascular: No new vascular finding. Skull: Negative Sinuses/Orbits: Opacified right maxillary sinus. Other visible sinuses are clear. Orbits negative. Other: None IMPRESSION: Old right frontal infarction. Acute infarction throughout the remainder of the right hemisphere as shown by studies yesterday. The area of acute infarction shows mild swelling but there is no midline shift. No evidence of hemorrhage. Electronically Signed   By: Nelson Chimes M.D.   On: 04/11/2020 18:38   CT Head Wo Contrast  Result Date: 04/09/2020 CLINICAL DATA:  Headache, prior stroke EXAM: CT HEAD WITHOUT CONTRAST TECHNIQUE: Contiguous axial images were obtained from the base of the skull through the vertex without intravenous contrast. COMPARISON:  None. FINDINGS: Brain: Large area of encephalomalacia involving the right frontal lobe. There is hypodensity involving the posterior right parietal lobe. No large extra-axial collections. No midline shift. There is ex vacuo dilatation of the right anterior  horn of the lateral ventricle. Vascular: No hyperdense vessel or unexpected calcification. Skull: The skull is intact. No fracture or focal lesion identified. Sinuses/Orbits: The visualized paranasal sinuses and mastoid air cells are clear. The orbits and globes intact. Other: None Osseous: No acute fracture or other significant osseous abnormality.The nasal bone, mandibles, zygomatic arches and pterygoid plates are intact. Orbits: No fracture identified. Unremarkable appearance of globes and orbits. Sinuses: There is near complete opacification of the right maxillary sinus. A small amount of mucosal thickening seen within the left maxillary sinus. Soft tissues:  No acute findings. Limited intracranial: No acute findings. IMPRESSION: Large area of encephalomalacia involving the right MCA territory with hypodensity involving the right posterior parietal lobe likely chronic or subacute infarct. If clinical concern remains would recommend MRI for further evaluation. Findings suggestive of right maxillary sinusitis. Electronically Signed   By: Prudencio Pair M.D.   On: 04/09/2020 22:10   CT ANGIO NECK W OR WO CONTRAST  Addendum Date: 04/10/2020   ADDENDUM REPORT: 04/10/2020 18:09 ADDENDUM: These results were called by telephone at the time of interpretation on 04/10/2020 at 6:09 pm to provider Dr. Hollice Espy , who verbally  acknowledged these results. Electronically Signed   By: Kellie Simmering DO   On: 04/10/2020 18:09   Result Date: 04/10/2020 CLINICAL DATA:  Neuro deficit, acute stroke suspected. EXAM: CT ANGIOGRAPHY HEAD AND NECK TECHNIQUE: Multidetector CT imaging of the head and neck was performed using the standard protocol during bolus administration of intravenous contrast. Multiplanar CT image reconstructions and MIPs were obtained to evaluate the vascular anatomy. Carotid stenosis measurements (when applicable) are obtained utilizing NASCET criteria, using the distal internal carotid diameter as the  denominator. CONTRAST:  45m OMNIPAQUE IOHEXOL 350 MG/ML SOLN COMPARISON:  Brain MRI 04/10/2020, head CT 04/09/2020, MRA head 09/15/2012. FINDINGS: CT HEAD FINDINGS Brain: Again demonstrated is extensive acute on chronic infarcts within the right cerebral hemisphere with acute infarcts affecting the right MCA and PCA vascular territories. Similar degree of mass effect. Associated ex vacuo dilatation of the frontal horn of the right lateral ventricle. No midline shift. No evidence of hemorrhagic conversion. No extra-axial fluid collection. No evidence of intracranial mass. Vascular: Reported below. Skull: Normal. Negative for fracture or focal lesion. Sinuses: Extensive partial opacification of the right maxillary sinus. No significant mastoid effusion. Orbits: No acute abnormality. Review of the MIP images confirms the above findings CTA NECK FINDINGS Aortic arch: Common origin of the innominate and left common carotid arteries. The visualized aortic arch is unremarkable. No hemodynamically significant innominate or proximal subclavian artery stenosis. Right carotid system: The CCA is patent to the bifurcation without significant stenosis. Soft tissue plaque within the ICA bulb without hemodynamically significant stenosis (50% or greater). Distal to this, there is a markedly diminutive appearance of the cervical ICA. The intracranial left ICA becomes occluded at the cavernous segment and remains occluded to the terminus. Left carotid system: CCA and ICA patent within the neck without hemodynamically significant stenosis (50% or greater). Mild soft plaque within the carotid bifurcation and proximal ICA. Vertebral arteries: The vertebral arteries are patent within the neck bilaterally without hemodynamically significant stenosis. The left vertebral artery is dominant. Skeleton: No acute bony abnormality or aggressive osseous lesion. Other neck: No neck mass or cervical lymphadenopathy. Upper chest: No consolidation  within the imaged lung apices. Review of the MIP images confirms the above findings CTA HEAD FINDINGS Anterior circulation: The pre cavernous right ICA is severely stenotic. There is occlusion of the right ICA beginning at the cavernous segment and this vessel remains occluded through the ICA terminus. There is non opacification of the right middle cerebral artery vessels. Additionally, there is non opacification of the A1 right anterior cerebral artery. The A2 and more distal right anterior cerebral artery is markedly diminutive with multifocal high-grade stenoses throughout. The intracranial left ICA is patent without significant stenosis. The M1 left middle cerebral artery is patent without significant stenosis. No left M2 proximal branch occlusion or high-grade proximal stenosis is identified. The left anterior cerebral artery is patent without high-grade proximal stenosis. There is mild fusiform dilation of the supraclinoid left ICA and left ICA terminus which may reflect an aneurysm. Posterior circulation: The non dominant intracranial right vertebral artery is developmentally diminutive, but patent and terminates as the right PICA. The dominant intracranial left vertebral artery is patent without significant stenosis, as is the basilar artery. The right posterior cerebral artery appears predominantly fetal in origin and the right posterior communicating artery appears occluded. There is flow within a hypoplastic right P1 segment and within the P2 and more distal right posterior cerebral artery. Fetal origin left posterior cerebral artery which is patent without  significant proximal stenosis. Venous sinuses: Within limitations of contrast timing, no convincing thrombus. Anatomic variants: None significant Review of the MIP images confirms the above findings IMPRESSION: CT head: Redemonstrated extensive acute on chronic infarcts within the right cerebral hemisphere with acute infarcts affecting the right MCA  and PCA vascular territories. No evidence of hemorrhagic conversion. Similar degree of mass effect. No midline shift. CTA neck: 1. There is soft plaque within the right carotid bifurcation/ICA bulb without hemodynamically significant stenosis. However, distal to this, the right cervical ICA is markedly asymmetrically diminutive. 2. The left common carotid artery, cervical internal carotid artery and bilateral vertebral arteries are patent within the neck without hemodynamically significant stenosis. CTA head: 1. 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 The right ICA becomes occluded at the cavernous segment and remains occluded through the supraclinoid segment. There is complete non opacification of the right middle cerebral arteries. Additionally, there is non opacification of the A1 right anterior cerebral artery. The A2 and more distal right anterior cerebral artery is markedly diminutive with multifocal high-grade stenoses. There is a fetal origin right posterior cerebral artery with occluded right posterior communicating artery. There is reconstitution of flow within the P2 and more distal right PCA. 2. 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 Electronically Signed: By: Kellie Simmering DO On: 04/10/2020 17:51   MR BRAIN WO CONTRAST  Result Date: 04/10/2020 CLINICAL DATA:  Stroke follow-up. History of stroke with new headache. EXAM: MRI HEAD WITHOUT CONTRAST TECHNIQUE: Multiplanar, multiecho pulse sequences of the brain and surrounding structures were obtained without intravenous contrast. COMPARISON:  Head CT from yesterday FINDINGS: Brain: Large remote right cerebral infarct affecting the ACA territory and upper division MCA territory with dense encephalomalacia. Restricted diffusion is seen in the remaining right cerebral cortex, also involving white matter. This is attributed to infarct rather than seizure phenomenon given the this spans  both the MCA and PCA distributions but is still most likely acute infarct. The right PCA territory appeared spared on prior head CT but is involved on this study. Wallerian degeneration in the right cortical spinal tracts. No acute infarct in the left hemisphere or posterior fossa. No mass, hydrocephalus, or acute hemorrhage. Vascular: No flow void seen in the right ICA until the supraclinoid segment. Skull and upper cervical spine: Normal marrow signal Sinuses/Orbits: Bilateral maxillary sinus mucosal thickening with fluid/secretions on the right. Maxillofacial CT was performed yesterday. IMPRESSION: 1. Acute inferior division right MCA territory infarct. Acute right PCA territory infarct (which occurred even more recently based on the prior head CT). 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 2. Pre-existing right ACA and upper division MCA territory infarcts. 3. 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 Electronically Signed   By: Monte Fantasia M.D.   On: 04/10/2020 08:13   ECHOCARDIOGRAM COMPLETE  Result Date: 04/10/2020    ECHOCARDIOGRAM REPORT   Patient Name:   Stephanie Mason Date of Exam: 04/10/2020 Medical Rec #:  [id number]       Height:       67.0 in Accession #:    [id number]      Weight:       147.0 lb Date of Birth:  Apr 23, 1951      BSA:          1.774 m Patient Age:    71 years        BP:  Not listed in chart/Not listed in                                               chart mmHg Patient Gender: F               HR:           84 bpm. Exam Location:  [facility] Procedure: 2D Echo, Cardiac Doppler and Color Doppler Indications:     Stroke 434.91  History:         Patient has no prior history of Echocardiogram examinations.                  Stroke; Risk Factors:Hypertension and Diabetes.  Sonographer:     Sherrie Sport RDCS (AE) Referring Phys:  [id number] Lanice Schwab AROOR Diagnosing Phys: Kathlyn Sacramento MD  Sonographer Comments: No apical window, no subcostal  window and Technically challenging study due to limited acoustic windows. IMPRESSIONS  1. Left ventricular ejection fraction, by estimation, is 60 to 65%. The left ventricle has normal function. The left ventricle has no regional wall motion abnormalities. Left ventricular diastolic function could not be evaluated.  2. Right ventricular systolic function is normal. The right ventricular size is normal. There is mildly elevated pulmonary artery systolic pressure.  3. The mitral valve is normal in structure. No evidence of mitral valve regurgitation. No evidence of mitral stenosis.  4. The aortic valve is normal in structure. Aortic valve regurgitation is not visualized. No aortic stenosis is present.  5. The inferior vena cava is normal in size with greater than 50% respiratory variability, suggesting right atrial pressure of 3 mmHg.  6. No apical or subcostal windows. FINDINGS  Left Ventricle: Left ventricular ejection fraction, by estimation, is 60 to 65%. The left ventricle has normal function. The left ventricle has no regional wall motion abnormalities. The left ventricular internal cavity size was normal in size. There is  no left ventricular hypertrophy. Left ventricular diastolic function could not be evaluated. Right Ventricle: The right ventricular size is normal. No increase in right ventricular wall thickness. Right ventricular systolic function is normal. There is mildly elevated pulmonary artery systolic pressure. The tricuspid regurgitant velocity is 2.99  m/s, and with an assumed right atrial pressure of 3 mmHg, the estimated right ventricular systolic pressure is 38.8 mmHg. Left Atrium: Left atrial size was normal in size. Right Atrium: Right atrial size was normal in size. Pericardium: There is no evidence of pericardial effusion. Mitral Valve: The mitral valve is normal in structure. Normal mobility of the mitral valve leaflets. No evidence of mitral valve regurgitation. No evidence of mitral valve  stenosis. Tricuspid Valve: The tricuspid valve is normal in structure. Tricuspid valve regurgitation is trivial. No evidence of tricuspid stenosis. Aortic Valve: The aortic valve is normal in structure. Aortic valve regurgitation is not visualized. No aortic stenosis is present. Pulmonic Valve: The pulmonic valve was normal in structure. Pulmonic valve regurgitation is not visualized. No evidence of pulmonic stenosis. Aorta: The aortic root is normal in size and structure. Venous: The inferior vena cava was not well visualized. The inferior vena cava is normal in size with greater than 50% respiratory variability, suggesting right atrial pressure of 3 mmHg. IAS/Shunts: The interatrial septum was not well visualized.  LEFT VENTRICLE PLAX 2D LVIDd:         3.40 cm  LVIDs:         1.83 cm LV PW:         0.96 cm LV IVS:        0.90 cm LVOT diam:     2.10 cm LVOT Area:     3.46 cm  LEFT ATRIUM         Index LA diam:    2.90 cm 1.63 cm/m                        PULMONIC VALVE AORTA                 PV Vmax:        0.76 m/s Ao Root diam: 2.50 cm PV Peak grad:   2.3 mmHg                       RVOT Peak grad: 2 mmHg  TRICUSPID VALVE TR Peak grad:   35.8 mmHg TR Vmax:        299.00 cm/s  SHUNTS Systemic Diam: 2.10 cm Kathlyn Sacramento MD Electronically signed by Kathlyn Sacramento MD Signature Date/Time: 04/10/2020/6:00:27 PM    Final    CT Maxillofacial Wo Contrast  Result Date: 04/09/2020 CLINICAL DATA:  Headache, prior stroke EXAM: CT HEAD WITHOUT CONTRAST TECHNIQUE: Contiguous axial images were obtained from the base of the skull through the vertex without intravenous contrast. COMPARISON:  None. FINDINGS: Brain: Large area of encephalomalacia involving the right frontal lobe. There is hypodensity involving the posterior right parietal lobe. No large extra-axial collections. No midline shift. There is ex vacuo dilatation of the right anterior horn of the lateral ventricle. Vascular: No hyperdense vessel or unexpected  calcification. Skull: The skull is intact. No fracture or focal lesion identified. Sinuses/Orbits: The visualized paranasal sinuses and mastoid air cells are clear. The orbits and globes intact. Other: None Osseous: No acute fracture or other significant osseous abnormality.The nasal bone, mandibles, zygomatic arches and pterygoid plates are intact. Orbits: No fracture identified. Unremarkable appearance of globes and orbits. Sinuses: There is near complete opacification of the right maxillary sinus. A small amount of mucosal thickening seen within the left maxillary sinus. Soft tissues:  No acute findings. Limited intracranial: No acute findings. IMPRESSION: Large area of encephalomalacia involving the right MCA territory with hypodensity involving the right posterior parietal lobe likely chronic or subacute infarct. If clinical concern remains would recommend MRI for further evaluation. Findings suggestive of right maxillary sinusitis. Electronically Signed   By: Prudencio Pair M.D.   On: 04/09/2020 22:10    Assessment:   Stephanie Mason is a 69 y.o. 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 Has residual L hemiparesis. Initially afebrile and wbc nml. Developed fever after admit. BCX neg, TTE neg. TEE planned. CT shows sinusitis  Diff includes sinusitis, post CVA aspiration, L foot infection, Endocarditis.   Recommendations RO endocarditis - repeat bcx x 2 and do TEE when stable.  Check xray L foot to eval for osteo. No wounds or redness.  Start unasyn for sinusitis and aspiration possible.  FU bcx Check HIV  Thank you very much for allowing me to participate in the care of this patient. Please call with questions.   Cheral Marker. Ola Spurr, MD

## 2020-04-13 NOTE — Progress Notes (Signed)
PT Cancellation Note  Patient Details Name: Stephanie Mason MRN: [id number] DOB: [birth year]   Cancelled Treatment:    Reason Eval/Treat Not Completed: Other (comment) Pt received lying in bed upon arrival to room. Pt reports not feeling well at this time and would prefer for PT to come back later. Will attempt at a later time if time permits.   Frederich Chick 04/13/2020, 10:22 AM

## 2020-04-13 NOTE — Care Management Important Message (Signed)
Important Message  Patient Details  Name: Stephanie Mason MRN: [id number] Date of Birth: 01/24/1951   Medicare Important Message Given:  Yes     Johnell Comings 04/13/2020, 1:55 PM

## 2020-04-14 ENCOUNTER — Telehealth: Payer: Self-pay

## 2020-04-14 ENCOUNTER — Encounter
Admission: EM | Disposition: A | Discharge status: Skilled Nursing Facility | Payer: Self-pay | Source: Home / Self Care | Attending: Family Medicine

## 2020-04-14 ENCOUNTER — Inpatient Hospital Stay (HOSPITAL_COMMUNITY)
Admit: 2020-04-14 | Discharge: 2020-04-14 | Disposition: A | Discharge status: Home / Self Care | Payer: Medicare (Managed Care) | Attending: Physician Assistant | Admitting: Physician Assistant

## 2020-04-14 DIAGNOSIS — G8190 Hemiplegia, unspecified affecting unspecified side: Secondary | ICD-10-CM

## 2020-04-14 DIAGNOSIS — M542 Cervicalgia: Secondary | ICD-10-CM

## 2020-04-14 DIAGNOSIS — R509 Fever, unspecified: Secondary | ICD-10-CM

## 2020-04-14 DIAGNOSIS — M436 Torticollis: Secondary | ICD-10-CM

## 2020-04-14 DIAGNOSIS — I63231 Cerebral infarction due to unspecified occlusion or stenosis of right carotid arteries: Secondary | ICD-10-CM

## 2020-04-14 DIAGNOSIS — J320 Chronic maxillary sinusitis: Secondary | ICD-10-CM

## 2020-04-14 DIAGNOSIS — I10 Essential (primary) hypertension: Secondary | ICD-10-CM

## 2020-04-14 DIAGNOSIS — R7881 Bacteremia: Secondary | ICD-10-CM

## 2020-04-14 DIAGNOSIS — I6389 Other cerebral infarction: Secondary | ICD-10-CM

## 2020-04-14 DIAGNOSIS — D7589 Other specified diseases of blood and blood-forming organs: Secondary | ICD-10-CM

## 2020-04-14 HISTORY — PX: TEE WITHOUT CARDIOVERSION: SHX5443

## 2020-04-14 LAB — CBC
HCT: 39.4 % (ref 36.0–46.0)
Hemoglobin: 13.6 g/dL (ref 12.0–15.0)
MCH: 35.3 pg — ABNORMAL HIGH (ref 26.0–34.0)
MCHC: 34.5 g/dL (ref 30.0–36.0)
MCV: 102.3 fL — ABNORMAL HIGH (ref 80.0–100.0)
Platelets: 201 10*3/uL (ref 150–400)
RBC: 3.85 MIL/uL — ABNORMAL LOW (ref 3.87–5.11)
RDW: 11.9 % (ref 11.5–15.5)
WBC: 7.6 10*3/uL (ref 4.0–10.5)
nRBC: 0 % (ref 0.0–0.2)

## 2020-04-14 LAB — BASIC METABOLIC PANEL
Anion gap: 9 (ref 5–15)
BUN: 8 mg/dL (ref 8–23)
CO2: 26 mmol/L (ref 22–32)
Calcium: 8.3 mg/dL — ABNORMAL LOW (ref 8.9–10.3)
Chloride: 98 mmol/L (ref 98–111)
Creatinine, Ser: 0.63 mg/dL (ref 0.44–1.00)
GFR calc Af Amer: >60 mL/min (ref >60)
GFR calc non Af Amer: >60 mL/min (ref >60)
Glucose, Bld: 148 mg/dL — ABNORMAL HIGH (ref 70–99)
Potassium: 3.3 mmol/L — ABNORMAL LOW (ref 3.5–5.1)
Sodium: 133 mmol/L — ABNORMAL LOW (ref 135–145)

## 2020-04-14 LAB — PROCALCITONIN: Procalcitonin: 0.99 ng/mL

## 2020-04-14 LAB — URIC ACID: Uric Acid, Serum: 4.1 mg/dL (ref 2.5–7.1)

## 2020-04-14 LAB — SEDIMENTATION RATE: Sed Rate: 74 mm/hr — ABNORMAL HIGH (ref 0–30)

## 2020-04-14 LAB — PROTHROMBIN GENE MUTATION

## 2020-04-14 LAB — C-REACTIVE PROTEIN: CRP: 21.7 mg/dL — ABNORMAL HIGH (ref <1.0)

## 2020-04-14 SURGERY — ECHOCARDIOGRAM, TRANSESOPHAGEAL
Anesthesia: Moderate Sedation

## 2020-04-14 MED ORDER — MIDAZOLAM HCL 2 MG/2ML IJ SOLN
INTRAMUSCULAR | Status: DC | PRN
  Administered 2020-04-14: 1 mg via INTRAVENOUS

## 2020-04-14 MED ORDER — BUTAMBEN-TETRACAINE-BENZOCAINE 2-2-14 % EX AERO
INHALATION_SPRAY | CUTANEOUS | Status: AC
  Filled 2020-04-14: qty 5

## 2020-04-14 MED ORDER — MIDAZOLAM HCL 5 MG/5ML IJ SOLN
INTRAMUSCULAR | Status: AC
  Filled 2020-04-14: qty 5

## 2020-04-14 MED ORDER — SODIUM CHLORIDE 0.9 % IV SOLN: INTRAVENOUS | Status: DC

## 2020-04-14 MED ORDER — MIDAZOLAM HCL 2 MG/2ML IJ SOLN
INTRAMUSCULAR | Status: AC | PRN
  Administered 2020-04-14: 1 mg via INTRAVENOUS

## 2020-04-14 MED ORDER — FENTANYL CITRATE (PF) 100 MCG/2ML IJ SOLN
INTRAMUSCULAR | Status: DC | PRN
  Administered 2020-04-14: 25 ug via INTRAVENOUS

## 2020-04-14 MED ORDER — LIDOCAINE HCL URETHRAL/MUCOSAL 2 % EX GEL
CUTANEOUS | Status: DC | PRN
  Administered 2020-04-14: 1

## 2020-04-14 MED ORDER — BUTAMBEN-TETRACAINE-BENZOCAINE 2-2-14 % EX AERO
INHALATION_SPRAY | CUTANEOUS | Status: DC | PRN
  Administered 2020-04-14: 1 via TOPICAL

## 2020-04-14 MED ORDER — FENTANYL CITRATE (PF) 100 MCG/2ML IJ SOLN
INTRAMUSCULAR | Status: AC
  Filled 2020-04-14: qty 2

## 2020-04-14 MED ORDER — LIDOCAINE VISCOUS HCL 2 % MT SOLN
OROMUCOSAL | Status: AC
  Filled 2020-04-14: qty 15

## 2020-04-14 MED ORDER — FENTANYL CITRATE (PF) 100 MCG/2ML IJ SOLN
INTRAMUSCULAR | Status: AC | PRN
  Administered 2020-04-14: 25 ug via INTRAVENOUS

## 2020-04-14 MED ORDER — SODIUM CHLORIDE FLUSH 0.9 % IV SOLN
INTRAVENOUS | Status: AC
  Filled 2020-04-14: qty 10

## 2020-04-14 NOTE — Progress Notes (Signed)
OT Cancellation Note  Patient Details Name: Stephanie Mason MRN: [id number] DOB: 1950-10-24   Cancelled Treatment:    Reason Eval/Treat Not Completed: Patient at procedure or test/ unavailable  Pt off floor for procedure at this time. Will f/u for OT treatment as appropriate/available. Thank you.  Rejeana Brock, MS, OTR/L ascom [phone] 04/14/20, 1:53 PM

## 2020-04-14 NOTE — Progress Notes (Signed)
Transesophageal Echocardiogram :  Indication: Bacteremia, rule out endocarditis, stroke, rule out thrombus Requesting/ordering  physician: Dr. Sherryll Burger  Procedure: Benzocaine spray x2 and 2 mls x 2 of viscous lidocaine were given orally to provide local anesthesia to the oropharynx. The patient was positioned supine on the left side, bite block provided. The patient was moderately sedated with the doses of versed and fentanyl as detailed below.  Using digital technique an omniplane probe was advanced into the distal esophagus without incident.   Moderate sedation: 1. Sedation used:  Versed: 2 mg, Fentanyl: 50 mcg 2. Time administered: 1:30 PM    Time when patient started recovery: 2:10 PM Total sedation time 40 minutes 3. I was face to face during this time  See report in EPIC  for complete details: In brief, No left atrial or left atrial appendage thrombus No valve vegetation  transgastric imaging revealed normal LV function with no RWMAs and no mural apical thrombus.  .  Estimated ejection fraction was 55%.  Right sided cardiac chambers were normal with no evidence of pulmonary hypertension.  Imaging of the septum showed no ASD or VSD Bubble study was negative for shunt 2D and color flow confirmed no PFO  The LA was well visualized in orthogonal views.  There was no spontaneous contrast and no thrombus in the LA and LA appendage   The descending thoracic aorta had mild mural aortic debris with no evidence of aneurysmal dilation or disection   Stephanie Mason 04/14/2020 2:30 PM

## 2020-04-14 NOTE — Telephone Encounter (Signed)
-----   Message from Sondra Barges, PA-C sent at 04/14/2020  3:46 PM EDT ----- On Tuesday, 04/18/2020, if she is still admitted to [facility], (Dr. Sherryll Burger indicated she would be), can someone please place a Zio AT on her at that time?   Dx: recurrent CVA  Thank you!

## 2020-04-14 NOTE — Progress Notes (Signed)
ID    Date of Admission:  04/09/2020  H/o CVA, HTN  Admitted with  Rt sided headache .nausea.vomiting and blurred visionon 04/09/20 Vitals in the ED were  Temp 99,1. BP 170/81, HR 72 Labs were WBC 8.3, HB 15.8, MCV 107.5 CT head and maxillofacial - Large area of encephalomalacia involving the right MCA territory with hypodensity involving the right posterior parietal lobe likely chronic or subacute infarct. If clinical concern remains would recommend MRI for further evaluation.  Findings suggestive of right maxillary sinusitis.    MRI brain - 1. Acute inferior division right MCA territory infarct. Acute right PCA territory infarct (which occurred even more recently based on the prior head CT). 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 2. Pre-existing right ACA and upper division MCA territory infarcts. 3. Absent right carotid flow in the upper neck and skull base  Pt is not a reliable historian Says she has neck pain, left foot pain Does not c/o pain in sinus No cough or sob H/o 2 prior strokes- first one in 1991 Says hs residual left sided weakness       ID: Stephanie Mason is a 69 y.o. female Active Problems:   Acute CVA (cerebrovascular accident) (HCC)   Stroke (cerebrum) (HCC)    Subjective: C/o neck pain Unable to move the neck freely   Medications:  .  stroke: mapping our early stages of recovery book   Does not apply Once  .  stroke: mapping our early stages of recovery book   Does not apply Once  . butamben-tetracaine-benzocaine      . enoxaparin (LOVENOX) injection  40 mg Subcutaneous Q24H  . fentaNYL      . fluticasone  1 spray Each Nare Daily  . gabapentin  200 mg Oral q AM  . gabapentin  400 mg Oral QHS  . lidocaine      . midazolam      . senna  1 tablet Oral QHS  . sodium chloride flush  10-40 mL Intracatheter Q12H  . sodium chloride flush        Objective: Vital signs in last 24 hours: Temp:  [97.9 F (36.6 C)-102.8 F  (39.3 C)] 97.9 F (36.6 C) (09/03 1255) Pulse Rate:  [68-100] 75 (09/03 1430) Resp:  [15-24] 21 (09/03 1430) BP: (115-151)/(54-92) 149/62 (09/03 1430) SpO2:  [95 %-100 %] 99 % (09/03 1430) Weight:  [75.7 kg] 75.7 kg (09/03 1255)  PHYSICAL EXAM:  General: somnolent but oriented x4, answers questions appropriately Head/neck  turned towards rt-  , cooperative, no distress, appears stated age.  Head: Normocephalic, without obvious abnormality, atraumatic. Eyes:no diplopia, divergent eyes ENT Nares normal. No drainage or sinus tenderness. Tongue coated Neck: troticollis rt Back: No CVA tenderness. Lungs: Clear to auscultation bilaterally. No Wheezing or Rhonchi. No rales. Heart: Regular rate and rhythm, no murmur, rub or gallop. Abdomen: Soft, non-tender,not distended. Bowel sounds normal. No masses Extremities: atraumatic, no cyanosis. No edema. No clubbing Skin: No rashes or lesions. Or bruising Lymph: Cervical, supraclavicular normal. Neurologic:left hemiplegia  Lab Results Recent Labs    04/13/20 0443 04/14/20 0422  WBC 7.7 7.6  HGB 14.1 13.6  HCT 40.9 39.4  NA 131* 133*  K 3.5 3.3*  CL 95* 98  CO2 25 26  BUN 9 8  CREATININE 0.81 0.63  Microbiology: 1 of 4- coag neg staph HIV neg Studies/Results:  TEE - no vegetation  DG Chest Port 1 View  Result Date: 04/13/2020 CLINICAL DATA:  Fever. Left foot pain. History of diabetes and hypertension. EXAM: PORTABLE CHEST 1 VIEW COMPARISON:  09/15/2012 FINDINGS: Mild hazy opacity is noted at the lung bases, likely atelectasis. Pneumonia is possible. Remainder of the lungs is clear. No convincing pleural effusion.  No pneumothorax. Cardiac silhouette is normal in size. No mediastinal or hilar masses. Skeletal structures are grossly intact. IMPRESSION: 1. Hazy lung base opacities most likely due to atelectasis. Pneumonia is possible. No other evidence of active cardiopulmonary disease. Electronically Signed   By: Amie Portland M.D.    On: 04/13/2020 16:51   DG Foot 2 Views Left  Result Date: 04/13/2020 CLINICAL DATA:  Fever and left foot pain.  History of diabetes. EXAM: LEFT FOOT - 2 VIEW COMPARISON:  None. FINDINGS: No fracture or bone lesion. There is no bone resorption to suggest osteomyelitis. 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 Small plantar calcaneal spur. Tissues are unremarkable. IMPRESSION: 1. No fracture.  No evidence of osteomyelitis. Electronically Signed   By: Amie Portland M.D.   On: 04/13/2020 16:53   MRI -brain 04/10/20 Acute inferior division right MCA territory infarct. Acute right PCA territory infarct (which occurred even more recently based on the prior head CT). 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 2. Pre-existing right ACA and upper division MCA territory infarcts. 3. 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 There is occlusion of the right ICA beginning at the cavernous segment and this vessel remains occluded through the ICA terminus. There is non opacification of the right middle cerebral artery vessels. Additionally, there is non opacification of the A1 right anterior cerebral artery. The A2 and more distal right anterior cerebral artery is markedly diminutive with multifocal high-grade stenoses throughout. Assessment/Plan:  Rt CVA- infarct Rt ACA/RT MCA area with severe stenosis of RT ICA. Left hemiplegia  Fever- unclear source- blood culture 1 of 4 coag neg staph- likely contaminant TEE negative Has neck pain and rt torticollis CTA neck did not show any bone lesions Would recommend MRI neck with contrast to look for any infectious source Doubt the rt maxillary  sinus to be the cause of the fever as she has no maxillary pain or tenderness There is no evidence of encephalitis or meningitis clinically Okay to continue unasyn for now Not sure why she has pain left foot -  no xray abnormality no erythema or swelling  Macrocytosis  Discussed the management with patient and her nurse

## 2020-04-14 NOTE — Progress Notes (Signed)
PROGRESS NOTE    Stephanie Mason   JEH:[id number]  DOB: Nov 27, 1950  PCP: Patient, No Pcp Per    DOA: 04/09/2020 LOS: 4   Brief Narrative   Stephanie Mason is an 69 y.o. 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  She presented to the ED on evening of 04/09/20 after developing a severe R periorbital headache the day before, associated nausea, vomiting, slurred speech and blurry vision in R eye.  [facility] whose nurse sent her to the ED.     ED Course:  Afebrile, hypertensive 156/85, tachypnea, HR 74.   BMP unremarkable other than glucose 148.  CBC showed hemoconcentration with Hbg 15.8 and macrocytosis MCV 107.5, no leukocytosis.    CT head and maxillofacial - Large area of encephalomalacia involving the right MCA territory with hypodensity involving the right posterior parietal lobe likely chronic or subacute infarct. If clinical concern remains would recommend MRI for further evaluation.  Findings suggestive of right maxillary sinusitis.  MRI brain - 1. Acute inferior division right MCA territory infarct. Acute right PCA territory infarct (which occurred even more recently based on the prior head CT). 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 2. Pre-existing right ACA and upper division MCA territory infarcts. 3. 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        Assessment & Plan   Active Problems:   Acute CVA (cerebrovascular accident) (HCC)   Stroke (cerebrum) (HCC)   Acute/subacute MCA and PCA stroke - etiology is to be determined.  She has history of two prior strokes.   Patient's for stroke was after starting oral contraceptive pills,  hypercoagulable work-up -ANA and lupus anticoagulant negative, normal protein CNS --2D echo within  normal limits, TEE planned for today 9/3 -also Zio monitor --continue ASA 81 mg PO daily,  -Not placed on statin as she had significant transaminitis in the past per [facility]CP at [facility]  Fever - possible source right maxillary sinusitis Temperature anywhere from 102-103 Blood culture growing staph species in 1 out of 4 bottles possibly contaminant  blood cultures negative from 9/1.  Continue Unasyn per ID and repeated blood culture today on 9/2.  Chest x-ray concerning for infiltrate versus atelectasis and UA pending Left foot x-ray negative for acute pathology, her left ankle is still tender - will check uric acid -gout?  Cytotoxic Cerebral Edema - per neurology and radiology (see H&P A/P for details), low risk for malignant cerebral edema or herniation.  Avoid hypotonic fluids.  Neuro checks as above.   Macrocytosis - chronic.  Patient was followed by Dr. Donneta Mason, he attributed this to "liver disease", pt denies hx of this however.  His note is available under MRN #[id number]--[id number] are supposed to be merged. Further work-up as an outpatient as warranted.  History of seizure disorder - taken off her Keppra last year, no seizures since.  Left leg neuropathy/paresis - Continue gabapentin 600 qAM, 300 in afternoon   DVT prophylaxis: enoxaparin (LOVENOX) injection 40 mg Start: 04/10/20 1100  Diet:  Diet Orders (From admission, onward)    Start     Ordered   04/14/20 1213  Diet NPO time specified  Diet effective now       Comments: Patient to remain NPO until fully  awake following the TEE.   04/14/20 1212            Code Status: Full Code    Subjective 04/14/20   Feels tired, had fever of 102.8 last night around 3 AM. Disposition Plan & Communication   Status is: Inpatient  Remains inpatient appropriate because:Ongoing diagnostic testing needed not appropriate for outpatient work up.  Requires close monitoring of neurologic status given large stroke and now she has been running  fever for last 2 days - pending infectious work-up.  D/C pending clearance by neurology and ID.   Dispo: The patient is from: Home              Anticipated d/c is to: Home              Anticipated d/c date is: 2-3 days              Patient currently is not medically stable to d/c.  Still febrile, pending TEE and loop monitoring   Family Communication: Family not at bedside today, patient updated   Consults, Procedures, Significant Events   Consultants:   Neurology  Procedures:  TEE and Zio planned for today  Antimicrobials:   Unasyn started 9/2  Objective   Vitals:   04/14/20 0505 04/14/20 0726 04/14/20 1131 04/14/20 1255  BP: 138/88 (!) 145/81 127/72 140/63  Pulse: 100 80 71 87  Resp: 16 17 18  (!) 24  Temp: (!) 100.5 F (38.1 C) 99.2 F (37.3 C) 98.5 F (36.9 C) 97.9 F (36.6 C)  TempSrc: Oral Oral  Oral  SpO2: 99% 98% 99% 95%  Weight: 75.7 kg   75.7 kg  Height:    5\' 7"  (1.702 m)    Intake/Output Summary (Last 24 hours) at 04/14/2020 1304 Last data filed at 04/14/2020 0700 Gross per 24 hour  Intake 440 ml  Output 250 ml  Net 190 ml   Filed Weights   04/11/20 0427 04/14/20 0505 04/14/20 1255  Weight: 73.4 kg 75.7 kg 75.7 kg    Physical Exam:  General exam: awake , no acute distress.  Right maxillary sinus area tenderness Respiratory system: CTAB, no wheezes, rales or rhonchi, normal respiratory effort. Cardiovascular system: normal S1/S2, RRR, no pedal edema.   Gastrointestinal system: soft, NT, ND Central nervous system: Alert, left homonymous hemianopsia, left facial droop, 5 out of 5 strength on the right.  Left upper extremity 1 out of 5 with spasticity in left lower extremity 2 out of 5 with brace in place Psychiatry: normal mood, congruent affect, judgement and insight appear normal Musculoskeletal: Left ankle tenderness Labs   Data Reviewed: I have personally reviewed following labs and imaging studies  CBC: Recent Labs  Lab 04/09/20 2131  04/13/20 0443 04/14/20 0422  WBC 8.3 7.7 7.6  NEUTROABS 7.1  --   --   HGB 15.8* 14.1 13.6  HCT 48.6* 40.9 39.4  MCV 107.5* 103.0* 102.3*  PLT 213 181 201   Basic Metabolic Panel: Recent Labs  Lab 04/09/20 2131 04/13/20 0443 04/14/20 0422  NA 139 131* 133*  K 4.3 3.5 3.3*  CL 102 95* 98  CO2 28 25 26   GLUCOSE 148* 158* 148*  BUN 10 9 8   CREATININE 0.88 0.81 0.63  CALCIUM 9.6 8.3* 8.3*   GFR: Estimated Creatinine Clearance: 73.4 mL/min (by C-G formula based on SCr of 0.63 mg/dL). Liver Function Tests: No results for input(s): AST, ALT, ALKPHOS, BILITOT, PROT, ALBUMIN in the last 168 hours. No results for  input(s): LIPASE, AMYLASE in the last 168 hours. No results for input(s): AMMONIA in the last 168 hours. Coagulation Profile: No results for input(s): INR, PROTIME in the last 168 hours. Cardiac Enzymes: No results for input(s): CKTOTAL, CKMB, CKMBINDEX, TROPONINI in the last 168 hours. BNP (last 3 results) No results for input(s): PROBNP in the last 8760 hours. HbA1C: No results for input(s): HGBA1C in the last 72 hours. CBG: No results for input(s): GLUCAP in the last 168 hours. Lipid Profile: No results for input(s): CHOL, HDL, LDLCALC, TRIG, CHOLHDL, LDLDIRECT in the last 72 hours. Thyroid Function Tests: No results for input(s): TSH, T4TOTAL, FREET4, T3FREE, THYROIDAB in the last 72 hours. Anemia Panel: No results for input(s): VITAMINB12, FOLATE, FERRITIN, TIBC, IRON, RETICCTPCT in the last 72 hours. Sepsis Labs: No results for input(s): PROCALCITON, LATICACIDVEN in the last 168 hours.  Recent Results (from the past 240 hour(s))  SARS Coronavirus 2 by RT PCR (hospital order, performed in [facility] lab) Nasopharyngeal Nasopharyngeal Swab     Status: None   Collection Time: 04/10/20 10:58 AM   Specimen: Nasopharyngeal Swab  Result Value Ref Range Status   SARS Coronavirus 2 NEGATIVE NEGATIVE Final    Comment: (NOTE) SARS-CoV-2 target nucleic  acids are NOT DETECTED.  The SARS-CoV-2 RNA is generally detectable in upper and lower respiratory specimens during the acute phase of infection. The lowest concentration of SARS-CoV-2 viral copies this assay can detect is 250 copies / mL. A negative result does not preclude SARS-CoV-2 infection and should not be used as the sole basis for treatment or other patient management decisions.  A negative result may occur with improper specimen collection / handling, submission of specimen other than nasopharyngeal swab, presence of viral mutation(s) within the areas targeted by this assay, and inadequate number of viral copies (<250 copies / mL). A negative result must be combined with clinical observations, patient history, and epidemiological information.  Fact Sheet for Patients:   BoilerBrush.com.cyhttps://www.fda.gov/media/136312/download  Fact Sheet for Healthcare Providers: https://pope.com/https://www.fda.gov/media/136313/download  This test is not yet approved or  cleared by the [place] FDA and has been authorized for detection and/or diagnosis of SARS-CoV-2 by FDA under an Emergency Use Authorization (EUA).  This EUA will remain in effect (meaning this test can be used) for the duration of the COVID-19 declaration under Section 564(b)(1) of the Act, 21 U.S.C. section 360bbb-3(b)(1), unless the authorization is terminated or revoked sooner.  Performed at [facility], [address] [place], [place] [address]   Culture, blood (Routine X 2) w Reflex to ID Panel     Status: None (Preliminary result)   Collection Time: 04/12/20 10:43 AM   Specimen: BLOOD  Result Value Ref Range Status   Specimen Description BLOOD BACK OF LEFT HAND  Final   Special Requests   Final    BOTTLES DRAWN AEROBIC AND ANAEROBIC Blood Culture adequate volume   Culture  Setup Time   Final    Organism ID to follow GRAM POSITIVE COCCI ANAEROBIC BOTTLE ONLY CRITICAL RESULT CALLED TO, READ BACK BY AND VERIFIED WITHPrincella Ion: JASON  ROBBINS [facility] LPHARMD 2153 04/13/20 HNM Performed at [facility], [address] [place], [place] [address]    Culture [facility]I  Final   Report Status PENDING  Incomplete  Blood Culture ID Panel (Reflexed)     Status: Abnormal   Collection Time: 04/12/20 10:43 AM  Result Value Ref Range Status   Enterococcus faecalis NOT DETECTED NOT DETECTED Final   Enterococcus  Faecium NOT DETECTED NOT DETECTED Final   Listeria monocytogenes NOT DETECTED NOT DETECTED Final   Staphylococcus species DETECTED (A) NOT DETECTED Final    Comment: CRITICAL RESULT CALLED TO, READ BACK BY AND VERIFIED WITH: JASON ROBBINS PHARMD 2153 04/13/20 HNM    Staphylococcus aureus (BCID) NOT DETECTED NOT DETECTED Final   Staphylococcus epidermidis NOT DETECTED NOT DETECTED Final   Staphylococcus lugdunensis NOT DETECTED NOT DETECTED Final   Streptococcus species NOT DETECTED NOT DETECTED Final   Streptococcus agalactiae NOT DETECTED NOT DETECTED Final   Streptococcus pneumoniae NOT DETECTED NOT DETECTED Final   Streptococcus pyogenes NOT DETECTED NOT DETECTED Final   A.calcoaceticus-baumannii NOT DETECTED NOT DETECTED Final   Bacteroides fragilis NOT DETECTED NOT DETECTED Final   Enterobacterales NOT DETECTED NOT DETECTED Final   Enterobacter cloacae complex NOT DETECTED NOT DETECTED Final   Escherichia coli NOT DETECTED NOT DETECTED Final   Klebsiella aerogenes NOT DETECTED NOT DETECTED Final   Klebsiella oxytoca NOT DETECTED NOT DETECTED Final   Klebsiella pneumoniae NOT DETECTED NOT DETECTED Final   Proteus species NOT DETECTED NOT DETECTED Final   Salmonella species NOT DETECTED NOT DETECTED Final   Serratia marcescens NOT DETECTED NOT DETECTED Final   Haemophilus influenzae NOT DETECTED NOT DETECTED Final   Neisseria meningitidis NOT DETECTED NOT DETECTED Final   Pseudomonas aeruginosa NOT DETECTED NOT DETECTED Final   Stenotrophomonas maltophilia NOT DETECTED NOT DETECTED Final   Candida albicans  NOT DETECTED NOT DETECTED Final   Candida auris NOT DETECTED NOT DETECTED Final   Candida glabrata NOT DETECTED NOT DETECTED Final   Candida krusei NOT DETECTED NOT DETECTED Final   Candida parapsilosis NOT DETECTED NOT DETECTED Final   Candida tropicalis NOT DETECTED NOT DETECTED Final   Cryptococcus neoformans/gattii NOT DETECTED NOT DETECTED Final    Comment: Performed at [facility], [address] [place], [place] [address]  Culture, blood (Routine X 2) w Reflex to ID Panel     Status: None (Preliminary result)   Collection Time: 04/12/20 12:47 PM   Specimen: BLOOD  Result Value Ref Range Status   Specimen Description BLOOD BLOOD LEFT WRIST  Final   Special Requests   Final    BOTTLES DRAWN AEROBIC AND ANAEROBIC Blood Culture adequate volume   Culture   Final    NO GROWTH 2 DAYS Performed at [facility], [address] [place], [place] [address]    Report Status PENDING  Incomplete  Culture, blood (Routine X 2) w Reflex to ID Panel     Status: None (Preliminary result)   Collection Time: 04/13/20  2:34 PM   Specimen: BLOOD  Result Value Ref Range Status   Specimen Description BLOOD BLOOD LEFT HAND  Final   Special Requests   Final    BOTTLES DRAWN AEROBIC AND ANAEROBIC Blood Culture adequate volume   Culture   Final    NO GROWTH < 12 HOURS Performed at [facility], [address] [place], [place] [address]    Report Status PENDING  Incomplete  Culture, blood (Routine X 2) w Reflex to ID Panel     Status: None (Preliminary result)   Collection Time: 04/13/20  4:18 PM   Specimen: BLOOD  Result Value Ref Range Status   Specimen Description BLOOD BLOOD LEFT HAND  Final   Special Requests   Final    BOTTLES DRAWN AEROBIC ONLY Blood Culture adequate volume   Culture   Final    NO GROWTH < 12 HOURS  Performed at [facility], [address], [place], [place] [address]    Report Status PENDING  Incomplete      Imaging Studies    DG Chest [facility] 1 View  Result Date: 04/13/2020 CLINICAL DATA:  Fever. Left foot pain. History of diabetes and hypertension. EXAM: PORTABLE CHEST 1 VIEW COMPARISON:  09/15/2012 FINDINGS: Mild hazy opacity is noted at the lung bases, likely atelectasis. Pneumonia is possible. Remainder of the lungs is clear. No convincing pleural effusion.  No pneumothorax. Cardiac silhouette is normal in size. No mediastinal or hilar masses. Skeletal structures are grossly intact. IMPRESSION: 1. Hazy lung base opacities most likely due to atelectasis. Pneumonia is possible. No other evidence of active cardiopulmonary disease. Electronically Signed   By: Amie Portland M.D.   On: 04/13/2020 16:51   DG Foot 2 Views Left  Result Date: 04/13/2020 CLINICAL DATA:  Fever and left foot pain.  History of diabetes. EXAM: LEFT FOOT - 2 VIEW COMPARISON:  None. FINDINGS: No fracture or bone lesion. There is no bone resorption to suggest osteomyelitis. 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 Small plantar calcaneal spur. Tissues are unremarkable. IMPRESSION: 1. No fracture.  No evidence of osteomyelitis. Electronically Signed   By: Amie Portland M.D.   On: 04/13/2020 16:53     Medications   Scheduled Meds: . [MAR Hold]  stroke: mapping our early stages of recovery book   Does not apply Once  . [MAR Hold]  stroke: mapping our early stages of recovery book   Does not apply Once  . butamben-tetracaine-benzocaine      . [MAR Hold] enoxaparin (LOVENOX) injection  40 mg Subcutaneous Q24H  . fentaNYL      . [MAR Hold] fluticasone  1 spray Each Nare Daily  . [MAR Hold] gabapentin  200 mg Oral q AM  . [MAR Hold] gabapentin  400 mg Oral QHS  . lidocaine      . midazolam      . [MAR Hold] senna  1 tablet Oral QHS  . [MAR Hold] sodium chloride flush  10-40 mL Intracatheter Q12H  . sodium chloride flush       Continuous Infusions: . sodium chloride 20 mL/hr at 04/14/20 1300  . [MAR Hold] ampicillin-sulbactam (UNASYN)  IV 3 g (04/14/20 0555)       LOS: 4 days    Time spent: 30 minutes with > 50% spent in coordination of care and direct patient contact    Caretha Rumbaugh Sherryll Burger, DO [facility]  04/14/2020, 1:04 PM    If 7PM-7AM, please contact night-coverage. How to contact the [facility] Attending or Consulting provider 7A - 7P or covering provider during after hours 7P -7A, for this patient?    1. Check the care team in [facility] and look for a) attending/consulting [facility] provider listed and b) the [facility] team listed 2. Log into [email or web address] and use [place]'s universal password to access. If you do not have the password, please contact the hospital operator. 3. Locate the [facility] provider you are looking for under [facility] and page to a number that you can be directly reached. 4. If you still have difficulty reaching the provider, please page the [facility] (Director on Call) for the Hospitalists listed on amion for assistance.

## 2020-04-14 NOTE — Progress Notes (Signed)
Progress Note  Patient Name: Stephanie Mason Date of Encounter: 04/14/2020  [facility] Cardiologist: new to [facility]  Subjective   Reports that she feels well, has pain in her neck, difficulty moving from side to side Case discussed with nursing, has continued to have periodic fevers, etiology unclear Repeat blood cultures drawn yesterday, still pending There is gram-positive cocci on cultures from April 12, 2020, staph species  Inpatient Medications    Scheduled Meds: .  stroke: mapping our early stages of recovery book   Does not apply Once  .  stroke: mapping our early stages of recovery book   Does not apply Once  . aspirin EC  81 mg Oral Daily  . enoxaparin (LOVENOX) injection  40 mg Subcutaneous Q24H  . fluticasone  1 spray Each Nare Daily  . gabapentin  200 mg Oral q AM  . gabapentin  400 mg Oral QHS  . senna  1 tablet Oral QHS  . sodium chloride flush  10-40 mL Intracatheter Q12H   Continuous Infusions: . ampicillin-sulbactam (UNASYN) IV 3 g (04/14/20 0555)   PRN Meds: acetaminophen **OR** acetaminophen (TYLENOL) oral liquid 160 mg/5 mL **OR** acetaminophen, sodium chloride flush   Vital Signs    Vitals:   04/14/20 0252 04/14/20 0505 04/14/20 0726 04/14/20 1131  BP: 140/79 138/88 (!) 145/81 127/72  Pulse: 82 100 80 71  Resp: 19 16 17 18   Temp: (!) 102.8 F (39.3 C) (!) 100.5 F (38.1 C) 99.2 F (37.3 C) 98.5 F (36.9 C)  TempSrc: Oral Oral Oral   SpO2: 99% 99% 98% 99%  Weight:  75.7 kg    Height:        Intake/Output Summary (Last 24 hours) at 04/14/2020 1206 Last data filed at 04/14/2020 0700 Gross per 24 hour  Intake 440 ml  Output 250 ml  Net 190 ml   Last 3 Weights 04/14/2020 04/11/2020 04/10/2020  Weight (lbs) 166 lb 14.2 oz 161 lb 13.1 oz 161 lb 14.4 oz  Weight (kg) 75.7 kg 73.4 kg 73.437 kg      Telemetry     - Personally Reviewed  ECG     - Personally Reviewed  Physical Exam   GEN: No acute distress.  Supine in bed, difficulty  moving her neck, Neck: No JVD Cardiac: RRR, no murmurs, rubs, or gallops.  Respiratory: Clear to auscultation bilaterally. GI: Soft, nontender, non-distended  MS: No edema; No deformity. Neuro:  Nonfocal  Psych: Normal affect   Labs    High Sensitivity Troponin:  No results for input(s): TROPONINIHS in the last 720 hours.    Chemistry Recent Labs  Lab 04/09/20 2131 04/13/20 0443 04/14/20 0422  NA 139 131* 133*  K 4.3 3.5 3.3*  CL 102 95* 98  CO2 28 25 26   GLUCOSE 148* 158* 148*  BUN 10 9 8   CREATININE 0.88 0.81 0.63  CALCIUM 9.6 8.3* 8.3*  GFRNONAA >60 >60 >60  GFRAA >60 >60 >60  ANIONGAP 9 11 9      Hematology Recent Labs  Lab 04/09/20 2131 04/13/20 0443 04/14/20 0422  WBC 8.3 7.7 7.6  RBC 4.52 3.97 3.85*  HGB 15.8* 14.1 13.6  HCT 48.6* 40.9 39.4  MCV 107.5* 103.0* 102.3*  MCH 35.0* 35.5* 35.3*  MCHC 32.5 34.5 34.5  RDW 12.1 11.9 11.9  PLT 213 181 201    BNPNo results for input(s): BNP, PROBNP in the last 168 hours.   DDimer No results for input(s): DDIMER in the last 168 hours.  Radiology    DG Chest Port 1 View  Result Date: 04/13/2020 CLINICAL DATA:  Fever. Left foot pain. History of diabetes and hypertension. EXAM: PORTABLE CHEST 1 VIEW COMPARISON:  09/15/2012 FINDINGS: Mild hazy opacity is noted at the lung bases, likely atelectasis. Pneumonia is possible. Remainder of the lungs is clear. No convincing pleural effusion.  No pneumothorax. Cardiac silhouette is normal in size. No mediastinal or hilar masses. Skeletal structures are grossly intact. IMPRESSION: 1. Hazy lung base opacities most likely due to atelectasis. Pneumonia is possible. No other evidence of active cardiopulmonary disease. Electronically Signed   By: Amie Portland M.D.   On: 04/13/2020 16:51   DG Foot 2 Views Left  Result Date: 04/13/2020 CLINICAL DATA:  Fever and left foot pain.  History of diabetes. EXAM: LEFT FOOT - 2 VIEW COMPARISON:  None. FINDINGS: No fracture or bone lesion.  There is no bone resorption to suggest osteomyelitis. 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 Small plantar calcaneal spur. Tissues are unremarkable. IMPRESSION: 1. No fracture.  No evidence of osteomyelitis. Electronically Signed   By: Amie Portland M.D.   On: 04/13/2020 16:53    Cardiac Studies   Echocardiogram 1. Left ventricular ejection fraction, by estimation, is 60 to 65%. The  left ventricle has normal function. The left ventricle has no regional  wall motion abnormalities. Left ventricular diastolic function could not  be evaluated.  2. Right ventricular systolic function is normal. The right ventricular  size is normal. There is mildly elevated pulmonary artery systolic  pressure.  3. The mitral valve is normal in structure. No evidence of mitral valve  regurgitation. No evidence of mitral stenosis.  4. The aortic valve is normal in structure. Aortic valve regurgitation is  not visualized. No aortic stenosis is present.  5. The inferior vena cava is normal in size with greater than 50%  respiratory variability, suggesting right atrial pressure of 3 mmHg.  6. No apical or subcostal windows.   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 She presented to the ED on evening of 04/09/20 after developing a severe R periorbital headache the day before, associated nausea, vomiting, slurred speech and blurry vision in R eye.  [facility] whose nurse sent her to the ED.   Assessment & Plan   1.  Stroke Etiology unclear, hypercoagulable work-up -ANA and lupus anticoagulant negative, normal protein CNS TEE scheduled today Plan for ZIO monitor  Bacteremia Gram-positive cocci, staph growing in cultures from September 1 TEE  as above to rule out endocarditis   Total encounter time more than 25 minutes  Greater than 50% was spent in counseling and coordination of care with the patient   For questions or updates, please contact [facility] Please consult [email or web address] for contact info under        Signed, Julien Nordmann, MD  04/14/2020, 12:06 PM

## 2020-04-14 NOTE — Progress Notes (Signed)
Physical Therapy Treatment Patient Details Name: Stephanie Mason MRN: [id number] DOB: 27-Nov-1950 Today's Date: 04/14/2020    History of Present Illness Stephanie Mason is a 69 y.o. 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 Patient was in her usual state of health until yesterday when she developed onset of right periorbital headache, slurred speech, and blurry vision in her right eye. 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    PT Comments    Pt received in bed and requesting to get out of the bed. Pt with inconsistent reports of headache location, initially stating 6/10 pain in forehead/bilat temples then changing to say that it was in the back of her neck. Pt required mod A for trunk elevation during supine to sit and able to manage BLE over edge of bed. Pt with fair sitting balance today noted to be leaning to the R and required verba/tactile cues to come into midline and occasional min A for alignment to midline. Pt performed visual scanning to L to promote cervical rotation/visual gaze to L and reaching with RUE across midline. Attempted stand pivot sit transfer x 2 with max A and pt yelling in pain with weightbearing through L foot. Deferred further attempts and performed lateral scooting from bed to recliner chair with bed height increased slightly. Required max+2 A for scooting hips and trunk control during transfer. Pt left sitting up in chair positioned to prevent R lateral lean and cervical flexion to R, with pillow to keep pt in midline. Limiting factor was pain today.     Follow Up Recommendations  Home health PT;Supervision for mobility/OOB;Other (comment) ([facility] program)     Equipment Recommendations  None recommended by PT    Recommendations for Other Services       Precautions / Restrictions  Precautions Precautions: Fall Required Braces or Orthoses: Other Brace Other Brace: L AFO Restrictions Weight Bearing Restrictions: No    Mobility  Bed Mobility Overal bed mobility: Needs Assistance Bed Mobility: Supine to Sit     Supine to sit: Mod assist;HOB elevated     General bed mobility comments: Mod A for trunk elevation into sitting; pt lying diagonally in bed with bilateral feet off and able to move BLE to and over edge of bed  Transfers Overall transfer level: Needs assistance Equipment used: None Transfers: Lateral/Scoot Transfers          Lateral/Scoot Transfers: Max assist;+2 physical assistance General transfer comment: max+2 A for sliding transfer from bed to chair due to increased pain in L foot and decreased tolerance to weightbearing  Ambulation/Gait             General Gait Details: deferred   Stairs             Wheelchair Mobility    Modified Rankin (Stroke Patients Only)       Balance Overall balance assessment: Needs assistance Sitting-balance support: Feet unsupported;Single extremity supported Sitting balance-Leahy Scale: Fair Sitting balance - Comments: pt with R lateral lean requring verbal and tactile cues to correct and occasional min A to sit upright       Standing balance comment: deferred                            Cognition Arousal/Alertness: Awake/alert Behavior During Therapy: [facility] for  tasks assessed/performed Overall Cognitive Status: Within Functional Limits for tasks assessed                                 General Comments: Pt initially reported headache and pointed to forehead and bilateral temples then later changed and said that the back of her neck was sore.      Exercises Other Exercises Other Exercises: pt performed cervical stretches: lateral and rotation to both L and R x 15 sec holds, 3 reps each Other Exercises: pt performed visual scanning to L and RUE reaching across  midline     General Comments        Pertinent Vitals/Pain Pain Assessment: 0-10 Pain Score: 6  Pain Location: posterior neck, L foot (dorsal surface) Pain Descriptors / Indicators: Discomfort Pain Intervention(s): Limited activity within patient's tolerance;Monitored during session;Repositioned    Home Living                      Prior Function            PT Goals (current goals can now be found in the care plan section) Acute Rehab PT Goals Patient Stated Goal: to go home PT Goal Formulation: With patient Time For Goal Achievement: 04/25/20 Potential to Achieve Goals: Good Progress towards PT goals: Progressing toward goals    Frequency    7X/week      PT Plan Current plan remains appropriate    Co-evaluation              AM-PAC PT "6 Clicks" Mobility   Outcome Measure  Help needed turning from your back to your side while in a flat bed without using bedrails?: A Lot Help needed moving from lying on your back to sitting on the side of a flat bed without using bedrails?: A Lot Help needed moving to and from a bed to a chair (including a wheelchair)?: A Lot Help needed standing up from a chair using your arms (e.g., wheelchair or bedside chair)?: A Lot Help needed to walk in hospital room?: Total Help needed climbing 3-5 steps with a railing? : Total 6 Click Score: 10    End of Session Equipment Utilized During Treatment: Gait belt Activity Tolerance: Patient limited by pain;Patient limited by fatigue Patient left: in chair;with call bell/phone within reach;with chair alarm set Nurse Communication: Mobility status PT Visit Diagnosis: Unsteadiness on feet (R26.81);Other abnormalities of gait and mobility (R26.89);Muscle weakness (generalized) (M62.81);Other symptoms and signs involving the nervous system (R29.898);Hemiplegia and hemiparesis;Pain Hemiplegia - Right/Left: Left Hemiplegia - dominant/non-dominant: Non-dominant Hemiplegia - caused by:  Cerebral infarction Pain - Right/Left: Left Pain - part of body:  (foot)     Time: 5366-4403 PT Time Calculation (min) (ACUTE ONLY): 38 min  Charges:                        Frederich Chick, SPT   Frederich Chick 04/14/2020, 12:20 PM

## 2020-04-14 NOTE — Progress Notes (Signed)
*  PRELIMINARY RESULTS* Echocardiogram Echocardiogram Transesophageal has been performed.  Stephanie Mason [name] 04/14/2020, 2:14 PM

## 2020-04-15 ENCOUNTER — Encounter: Payer: Self-pay | Admitting: Cardiovascular Disease

## 2020-04-15 ENCOUNTER — Inpatient Hospital Stay: Payer: Medicare (Managed Care)

## 2020-04-15 LAB — CBC
HCT: 39 % (ref 36.0–46.0)
Hemoglobin: 13 g/dL (ref 12.0–15.0)
MCH: 34.6 pg — ABNORMAL HIGH (ref 26.0–34.0)
MCHC: 33.3 g/dL (ref 30.0–36.0)
MCV: 103.7 fL — ABNORMAL HIGH (ref 80.0–100.0)
Platelets: 216 10*3/uL (ref 150–400)
RBC: 3.76 MIL/uL — ABNORMAL LOW (ref 3.87–5.11)
RDW: 12 % (ref 11.5–15.5)
WBC: 7.6 10*3/uL (ref 4.0–10.5)
nRBC: 0 % (ref 0.0–0.2)

## 2020-04-15 LAB — BASIC METABOLIC PANEL
Anion gap: 8 (ref 5–15)
BUN: 11 mg/dL (ref 8–23)
CO2: 27 mmol/L (ref 22–32)
Calcium: 8.3 mg/dL — ABNORMAL LOW (ref 8.9–10.3)
Chloride: 98 mmol/L (ref 98–111)
Creatinine, Ser: 0.8 mg/dL (ref 0.44–1.00)
GFR calc Af Amer: >60 mL/min (ref >60)
GFR calc non Af Amer: >60 mL/min (ref >60)
Glucose, Bld: 138 mg/dL — ABNORMAL HIGH (ref 70–99)
Potassium: 3.4 mmol/L — ABNORMAL LOW (ref 3.5–5.1)
Sodium: 133 mmol/L — ABNORMAL LOW (ref 135–145)

## 2020-04-15 LAB — PROCALCITONIN: Procalcitonin: 0.66 ng/mL

## 2020-04-15 LAB — GLUCOSE, CAPILLARY
Glucose-Capillary: 93 mg/dL (ref 70–99)
Glucose-Capillary: 148 mg/dL — ABNORMAL HIGH (ref 70–99)

## 2020-04-15 MED ORDER — POTASSIUM CHLORIDE CRYS ER 20 MEQ PO TBCR
40.0000 meq | EXTENDED_RELEASE_TABLET | Freq: Once | ORAL | Status: AC
  Administered 2020-04-15: 40 meq via ORAL
  Filled 2020-04-15: qty 2

## 2020-04-15 MED ORDER — GADOBUTROL 1 MMOL/ML IV SOLN
7.5000 mL | Freq: Once | INTRAVENOUS | Status: AC | PRN
  Administered 2020-04-15: 7.5 mL via INTRAVENOUS

## 2020-04-15 NOTE — Progress Notes (Signed)
Occupational Therapy Treatment Patient Details Name: Stephanie Mason MRN: [id number] DOB: Mar 17, 1951 Today's Date: 04/15/2020    History of present illness Stephanie Mason is a 69 y.o. 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 Patient was in her usual state of health until yesterday when she developed onset of right periorbital headache, slurred speech, and blurry vision in her right eye. 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   OT comments  Pt seen for OT tx this date to f/u re: safety with ADLs/ADL mobility. Pt declines to get OOB this date and is somewhat groggy throughout session. She is open to ROM/therapeutic activity with UEs and some self care at bed level. Pt requires MIN/MOD A at bed level to wash her face and completes below-listed ROM exercises with OT guiding and cueing to grade task. Pt with low fxl activity tolerance this date and decreased ability to complete self care. In addition, per PT, pt with increased difficulty with transfers at this time. Updated d/c recommendation to reflect current care needs versus PLOF. Anticipate pt will require STR to safely return to PLOF or highest attainable level.   Follow Up Recommendations  SNF    Equipment Recommendations       Recommendations for Other Services      Precautions / Restrictions Precautions Precautions: Fall Required Braces or Orthoses: Other Brace Other Brace: L AFO Restrictions Weight Bearing Restrictions: No       Mobility Bed Mobility               General bed mobility comments: pt declines OOB during OT session at this time citing fatigue. Pt noted to be warm and RN aware of elevated temp  Transfers                 General transfer comment: pt declines to get OOB    Balance                                            ADL either performed or assessed with clinical judgement   ADL Overall ADL's : Needs assistance/impaired     Grooming: Minimal assistance;Moderate assistance;Bed level;Wash/dry face Grooming Details (indicate cue type and reason): HOB elevated, to wash face, min verbal cues to sequence                               General ADL Comments: pt declines to sit up/stand up during OT session citing fatigue     Vision       Perception     Praxis      Cognition Arousal/Alertness: Awake/alert Behavior During Therapy: WFL for tasks assessed/performed;Flat affect Overall Cognitive Status: Within Functional Limits for tasks assessed                                 General Comments: alert, but groggy/drowsy during session        Exercises Other Exercises Other Exercises: OT engages p tin R UE AAROM and L UE PROM of shoulder, elbow and digits in all available planes within pt's tolerance. Pt's R hand noted to  have decreased tone and L elbow with increased flexor tone requiring gentle, prolonged stretching in prep for ROM. Pt tolerates well, but remains moderately groggy/drowsy throughout.   Shoulder Instructions       General Comments      Pertinent Vitals/ Pain       Pain Assessment: Faces Faces Pain Scale: Hurts little more Pain Location: posterior neck, L foot (dorsal surface) Pain Descriptors / Indicators: Grimacing Pain Intervention(s): Limited activity within patient's tolerance;Monitored during session  Home Living                                          Prior Functioning/Environment              Frequency  Min 2X/week        Progress Toward Goals  OT Goals(current goals can now be found in the care plan section)  Progress towards OT goals: OT to reassess next treatment (pt with temp and groggy this session, did not perform well in regards to meeting goals.)  Acute Rehab OT  Goals Patient Stated Goal: to go home OT Goal Formulation: With patient Time For Goal Achievement: 04/24/20 Potential to Achieve Goals: Good  Plan Discharge plan needs to be updated    Co-evaluation                 AM-PAC OT "6 Clicks" Daily Activity     Outcome Measure   Help from another person eating meals?: A Little Help from another person taking care of personal grooming?: A Lot Help from another person toileting, which includes using toliet, bedpan, or urinal?: A Lot Help from another person bathing (including washing, rinsing, drying)?: A Lot Help from another person to put on and taking off regular upper body clothing?: A Lot Help from another person to put on and taking off regular lower body clothing?: A Lot 6 Click Score: 13    End of Session    OT Visit Diagnosis: Other abnormalities of gait and mobility (R26.89);Muscle weakness (generalized) (M62.81);Hemiplegia and hemiparesis Hemiplegia - Right/Left: Left Hemiplegia - dominant/non-dominant: Non-Dominant Hemiplegia - caused by: Cerebral infarction   Activity Tolerance Patient tolerated treatment well   Patient Left in bed;with bed alarm set;with call bell/phone within reach   Nurse Communication Other (comment) (temp of 101)        Time: 8299-3716 OT Time Calculation (min): 23 min  Charges: OT General Charges $OT Visit: 1 Visit OT Treatments $Self Care/Home Management : 8-22 mins $Therapeutic Activity: 8-22 mins   Rejeana Brock, MS, OTR/L ascom [phone] 04/15/20, 6:18 PM

## 2020-04-15 NOTE — TOC Progression Note (Signed)
Transition of Care [facility]) - Progression Note    Patient Details  Name: Stephanie Mason MRN: [id number] Date of Birth: 1951-02-10  Transition of Care [facility]) CM/SW Contact  Eilleen Kempf, [place] Phone Number: 04/15/2020, 5:54 PM  Clinical Narrative:    Patient recommended for SNF placement. CSW contacted Zella Ball with [facility] for approval and accepted facilities. CSW completed SNF work-up, PASRR#, sent out to facilities contracted [facility] and waiting for bed offers   Expected Discharge Plan: Home w Home Health Services Barriers to Discharge: Continued Medical Work up  Expected Discharge Plan and Services Expected Discharge Plan: Home w Home Health Services   Discharge Planning Services: CM Consult Post Acute Care Choice: Home Health Living arrangements for the past 2 months: Single Family Home                                       Social Determinants of Health (SDOH) Interventions    Readmission Risk Interventions No flowsheet data found.

## 2020-04-15 NOTE — Progress Notes (Signed)
[facility] PROGRESS NOTE    Kymberley Raz  NAT:[id number] DOB: 1951-01-21 DOA: 04/09/2020 PCP: Patient, No Pcp Per      Brief Narrative:  Mrs. Carton is a 69 y.o. F with hx recurrent stroke and left sided hemiparesis who presented with worsening left sided weakness.  In the ER, CT head showed new acute right sided strokes.     Assessment & Plan:  Acute RIGHT MCA/PCA stroke -Non-invasive angiography showed occluded RIGHT ICA -Transesophageal echocardiogram showed no cardiogenic source of embolism -Statin contraindicated due to prior intolerance -Aspirin ordered at admission --> continue aspirin 81 -Atrial fibrillation: not present, Zio patch pending -tPA not given because outside the window -Dysphagia screen ordered in ER -PT eval ordered: recommended SNF -Smoking cessation: not pertinent     Hypokalemia Mild -Supllement K  Fever CXR with new streaky opacities at bases, foot x-ray normal and exam benign to me.  No meningismus, doubt meningitis.  Sinusitis on CT but no sinus symptoms, doubt sinusitis.  TEE and Bcx rules out endocarditis.  MRI neck unremarkable.  Fever again today. -Continue Unasyn -Check respiratory virus panel -Follow HIV   Blood culture positive 1/4 cultures with CoNS, likely contaminant.    Torticollis This is generally a benign condition. Not associated with infection or fever (in the absence of any acute dystonia which she does not have).  Discussed with neurology, we feel this is likely associated with her acute on chronic severe left-sided contractures from progression of her prior strokes -Start Flexeril -Consider Botox as an outpatient     Disposition: Status is: Inpatient  Remains inpatient appropriate because:Inpatient level of care appropriate due to severity of illness   Dispo: The patient is from: Home              Anticipated d/c is to: SNF              Anticipated d/c date is: 1 day               Patient currently is not medically stable to d/c.              MDM: The below labs and imaging reports were reviewed and summarized above.  Medication management as above.    DVT prophylaxis: enoxaparin (LOVENOX) injection 40 mg Start: 04/10/20 1100  Code Status: FULL Family Communication:  husband    Consultants:   Neurology  Cardiology  Infectious disease  Procedures:   TEE  Antimicrobials:   Unasyn 9/2 >>   Culture data:   9/1 blood culture -- CoNS in 1/4  9/2 blood culture -- no growth           Subjective: Fever today. No confusion, headache. No vomiting, diarrhea. No respiratory distress.  Objective: Vitals:   04/14/20 1533 04/14/20 2021 04/15/20 0508 04/15/20 1627  BP: 121/60 (!) 161/80 140/69 (!) 147/67  Pulse: 78 (!) 102 88 90  Resp: 18 16 17 18   Temp: 98.4 F (36.9 C) 98.7 F (37.1 C) 98.3 F (36.8 C) (!) 103 F (39.4 C)  TempSrc: Oral  Oral Oral  SpO2: 96% 95% 100% 100%  Weight:   79.6 kg   Height:       No intake or output data in the 24 hours ending 04/15/20 1634 Filed Weights   04/14/20 0505 04/14/20 1255 04/15/20 0508  Weight: 75.7 kg 75.7 kg 79.6 kg    Examination: General appearance: Thin elderly adult female, awake and in no obvious distress, interactive. HEENT: Anicteric,  conjunctiva pink, lids and lashes normal. No nasal deformity, discharge, epistaxis.  Lips moist.   Skin: Warm and dry. No jaundice.  No suspicious rashes or lesions. No redness of bilateral feet. No swelling. No skin ulcerations. Cardiac: RRR, nl S1-S2, no murmurs appreciated.  Capillary refill is brisk.  JVP not visible due to torticollis.  No LE edema.  Radial pulses 2+ and symmetric. Respiratory: Normal respiratory rate and rhythm.  CTAB without rales or wheezes. Abdomen: Abdomen soft. No TTP or guarding. No ascites, distension, hepatosplenomegaly.   MSK: No deformities or effusions. Left-sided contractures, torticollis to the right. No  meningismus. Neuro: Awake and alert.  EOMI, severe left-sided hemiparesis, unable to bear weight. Speech fluent.    Psych: Sensorium intact and responding to questions, attention normal. Affect flat.  Judgment and insight appear normal.    Data Reviewed: I have personally reviewed following labs and imaging studies:  CBC: Recent Labs  Lab 04/09/20 2131 04/13/20 0443 04/14/20 0422 04/15/20 0514  WBC 8.3 7.7 7.6 7.6  NEUTROABS 7.1  --   --   --   HGB 15.8* 14.1 13.6 13.0  HCT 48.6* 40.9 39.4 39.0  MCV 107.5* 103.0* 102.3* 103.7*  PLT 213 181 201 216   Basic Metabolic Panel: Recent Labs  Lab 04/09/20 2131 04/13/20 0443 04/14/20 0422 04/15/20 0514  NA 139 131* 133* 133*  K 4.3 3.5 3.3* 3.4*  CL 102 95* 98 98  CO2 28 25 26 27   GLUCOSE 148* 158* 148* 138*  BUN 10 9 8 11   CREATININE 0.88 0.81 0.63 0.80  CALCIUM 9.6 8.3* 8.3* 8.3*   GFR: Estimated Creatinine Clearance: 75.1 mL/min (by C-G formula based on SCr of 0.8 mg/dL). Liver Function Tests: No results for input(s): AST, ALT, ALKPHOS, BILITOT, PROT, ALBUMIN in the last 168 hours. No results for input(s): LIPASE, AMYLASE in the last 168 hours. No results for input(s): AMMONIA in the last 168 hours. Coagulation Profile: No results for input(s): INR, PROTIME in the last 168 hours. Cardiac Enzymes: No results for input(s): CKTOTAL, CKMB, CKMBINDEX, TROPONINI in the last 168 hours. BNP (last 3 results) No results for input(s): PROBNP in the last 8760 hours. HbA1C: No results for input(s): HGBA1C in the last 72 hours. CBG: Recent Labs  Lab 04/15/20 0820  GLUCAP 93   Lipid Profile: No results for input(s): CHOL, HDL, LDLCALC, TRIG, CHOLHDL, LDLDIRECT in the last 72 hours. Thyroid Function Tests: No results for input(s): TSH, T4TOTAL, FREET4, T3FREE, THYROIDAB in the last 72 hours. Anemia Panel: No results for input(s): VITAMINB12, FOLATE, FERRITIN, TIBC, IRON, RETICCTPCT in the last 72 hours. Urine analysis: No  results found for: COLORURINE, APPEARANCEUR, LABSPEC, PHURINE, GLUCOSEU, HGBUR, BILIRUBINUR, KETONESUR, PROTEINUR, UROBILINOGEN, NITRITE, LEUKOCYTESUR Sepsis Labs: @LABRCNTIP (procalcitonin:4,lacticacidven:4)  ) Recent Results (from the past 240 hour(s))  SARS Coronavirus 2 by RT PCR (hospital order, performed [facility]) Nasopharyngeal Nasopharyngeal Swab     Status: None   Collection Time: 04/10/20 10:58 AM   Specimen: Nasopharyngeal Swab  Result Value Ref Range Status   SARS Coronavirus 2 NEGATIVE NEGATIVE Final    Comment: (NOTE) SARS-CoV-2 target nucleic acids are NOT DETECTED.  The SARS-CoV-2 RNA is generally detectable in upper and lower respiratory specimens during the acute phase of infection. The lowest concentration of SARS-CoV-2 viral copies this assay can detect is 250 copies / mL. A negative result does not preclude SARS-CoV-2 infection and should not be used as the sole basis for treatment or other patient management decisions.  A negative result may occur with improper specimen collection / handling, submission of specimen other than nasopharyngeal swab, presence of viral mutation(s) within the areas targeted by this assay, and inadequate number of viral copies (<250 copies / mL). A negative result must be combined with clinical observations, patient history, and epidemiological information.  Fact Sheet for Patients:   BoilerBrush.com.cy  Fact Sheet for Healthcare Providers: https://pope.com/  This test is not yet approved or  cleared by the [place] FDA and has been authorized for detection and/or diagnosis of SARS-CoV-2 by FDA under an Emergency Use Authorization (EUA).  This EUA will remain in effect (meaning this test can be used) for the duration of the COVID-19 declaration under Section 564(b)(1) of the Act, 21 U.S.C. section 360bbb-3(b)(1), unless the authorization is terminated or revoked  sooner.  Performed at [facility], [address] [place], [place] [address]   Culture, blood (Routine X 2) w Reflex to ID Panel     Status: None (Preliminary result)   Collection Time: 04/12/20 10:43 AM   Specimen: BLOOD  Result Value Ref Range Status   Specimen Description BLOOD BACK OF LEFT HAND  Final   Special Requests   Final    BOTTLES DRAWN AEROBIC AND ANAEROBIC Blood Culture adequate volume   Culture  Setup Time   Final    Organism ID to follow GRAM POSITIVE COCCI ANAEROBIC BOTTLE ONLY CRITICAL RESULT CALLED TO, READ BACK BY AND VERIFIED WITHPrincella Ion [facility] 2153 04/13/20 HNM Performed at [facility], [address] [place], [place] [address]    Culture GRAM POSITIVE COCCI  Final   Report Status PENDING  Incomplete  Blood Culture ID Panel (Reflexed)     Status: Abnormal   Collection Time: 04/12/20 10:43 AM  Result Value Ref Range Status   Enterococcus faecalis NOT DETECTED NOT DETECTED Final   Enterococcus Faecium NOT DETECTED NOT DETECTED Final   Listeria monocytogenes NOT DETECTED NOT DETECTED Final   Staphylococcus species DETECTED (A) NOT DETECTED Final    Comment: CRITICAL RESULT CALLED TO, READ BACK BY AND VERIFIED WITH: JASON ROBBINS PHARMD 2153 04/13/20 HNM    Staphylococcus aureus (BCID) NOT DETECTED NOT DETECTED Final   Staphylococcus epidermidis NOT DETECTED NOT DETECTED Final   Staphylococcus lugdunensis NOT DETECTED NOT DETECTED Final   Streptococcus species NOT DETECTED NOT DETECTED Final   Streptococcus agalactiae NOT DETECTED NOT DETECTED Final   Streptococcus pneumoniae NOT DETECTED NOT DETECTED Final   Streptococcus pyogenes NOT DETECTED NOT DETECTED Final   A.calcoaceticus-baumannii NOT DETECTED NOT DETECTED Final   Bacteroides fragilis NOT DETECTED NOT DETECTED Final   Enterobacterales NOT DETECTED NOT DETECTED Final   Enterobacter cloacae complex NOT DETECTED NOT DETECTED Final   Escherichia coli NOT DETECTED NOT  DETECTED Final   Klebsiella aerogenes NOT DETECTED NOT DETECTED Final   Klebsiella oxytoca NOT DETECTED NOT DETECTED Final   Klebsiella pneumoniae NOT DETECTED NOT DETECTED Final   Proteus species NOT DETECTED NOT DETECTED Final   Salmonella species NOT DETECTED NOT DETECTED Final   Serratia marcescens NOT DETECTED NOT DETECTED Final   Haemophilus influenzae NOT DETECTED NOT DETECTED Final   Neisseria meningitidis NOT DETECTED NOT DETECTED Final   Pseudomonas aeruginosa NOT DETECTED NOT DETECTED Final   Stenotrophomonas maltophilia NOT DETECTED NOT DETECTED Final   Candida albicans NOT DETECTED NOT DETECTED Final   Candida auris NOT DETECTED NOT DETECTED Final   Candida glabrata NOT DETECTED NOT DETECTED Final   Candida krusei NOT DETECTED  NOT DETECTED Final   Candida parapsilosis NOT DETECTED NOT DETECTED Final   Candida tropicalis NOT DETECTED NOT DETECTED Final   Cryptococcus neoformans/gattii NOT DETECTED NOT DETECTED Final    Comment: Performed at [facility], [address] [place], [place] [address]  Culture, blood (Routine X 2) w Reflex to ID Panel     Status: None (Preliminary result)   Collection Time: 04/12/20 12:47 PM   Specimen: BLOOD  Result Value Ref Range Status   Specimen Description BLOOD BLOOD LEFT WRIST  Final   Special Requests   Final    BOTTLES DRAWN AEROBIC AND ANAEROBIC Blood Culture adequate volume   Culture   Final    NO GROWTH 3 DAYS Performed at [facility], [address], [place], [place] [address]    Report Status PENDING  Incomplete  Culture, blood (Routine X 2) w Reflex to ID Panel     Status: None (Preliminary result)   Collection Time: 04/13/20  2:34 PM   Specimen: BLOOD  Result Value Ref Range Status   Specimen Description BLOOD BLOOD LEFT HAND  Final   Special Requests   Final    BOTTLES DRAWN AEROBIC AND ANAEROBIC Blood Culture adequate volume   Culture   Final    NO GROWTH 2 DAYS Performed at [facility],  [address] [place], [place] [address]    Report Status PENDING  Incomplete  Culture, blood (Routine X 2) w Reflex to ID Panel     Status: None (Preliminary result)   Collection Time: 04/13/20  4:18 PM   Specimen: BLOOD  Result Value Ref Range Status   Specimen Description BLOOD BLOOD LEFT HAND  Final   Special Requests   Final    BOTTLES DRAWN AEROBIC ONLY Blood Culture adequate volume   Culture   Final    NO GROWTH 2 DAYS Performed at [facility], [address], [place], [place] [address]    Report Status PENDING  Incomplete         Radiology Studies: MR NECK W WO CONTRAST  Result Date: 04/15/2020 CLINICAL DATA:  Neck abscess.  Fever.  Fever of unknown origin EXAM: MRI OF THE NECK WITH CONTRAST TECHNIQUE: Multiplanar, multisequence MR imaging was performed following the administration of intravenous contrast. CONTRAST:  7.7mL GADAVIST GADOBUTROL 1 MMOL/ML IV SOLN COMPARISON:  CTA head neck from 04/10/2020 FINDINGS: Pharynx and larynx: No evidence of mass or inflammation Salivary glands: Normal appearing and essentially symmetric. Thyroid: No mass or enlargement. Lymph nodes: None enlarged or abnormal signal. Vascular: Recent CTA.  No interval findings. Limited intracranial: Negative Visualized orbits: Trace coverage without detected abnormality Mastoids and visualized paranasal sinuses: Right maxillary sinusitis with mucosal thickening and central fluid. Skeleton: No acute or erosive finding. Upper chest: Negative IMPRESSION: No evidence of inflammation or infection in the neck. Negative for abscess. Electronically Signed   By: Marnee Spring M.D.   On: 04/15/2020 14:12   ECHO TEE  Result Date: 04/14/2020    TRANSESOPHOGEAL ECHO REPORT   Patient Name:   Stephanie Mason Date of Exam: 04/14/2020 Medical Rec #:  [id number]       Height:       67.0 in Accession #:    [id number]      Weight:       166.9 lb Date of Birth:  18-Nov-1950      BSA:          1.873 m Patient Age:    89  years        BP:           137/92 mmHg Patient Gender: F               HR:           78 bpm. Exam Location:  [facility] Procedure: Transesophageal Echo, Color Doppler, Cardiac Doppler and Saline            Contrast Bubble Study Indications:     I63.9 Stroke  History:         Patient has prior history of Echocardiogram examinations, most                  recent 04/10/2020. Stroke; Risk Factors:Hypertension and                  Diabetes.  Sonographer:     Humphrey Rolls RDCS (AE) Referring Phys:  [id number] Sondra Barges Diagnosing Phys: Julien Nordmann MD PROCEDURE: TEE procedure time was 40 minutes. The transesophogeal probe was passed without difficulty through the esophogus of the patient. Local oropharyngeal anesthetic was provided with Cetacaine and Benzocaine spray. Sedation performed by performing physician. Image quality was excellent. The patient's vital signs; including heart rate, blood pressure, and oxygen saturation; remained stable throughout the procedure. The patient developed no complications during the procedure. IMPRESSIONS  1. Left ventricular ejection fraction, by estimation, is 60 to 65%. The left ventricle has normal function. The left ventricle has no regional wall motion abnormalities. There is mild left ventricular hypertrophy.  2. Right ventricular systolic function is normal. The right ventricular size is normal.  3. Left atrial size was mildly dilated. No left atrial/left atrial appendage thrombus was detected.  4. No valve vegetation noted  5. No LA or LA appendage thrombus Conclusion(s)/Recommendation(s): Normal biventricular function without evidence of hemodynamically significant valvular heart disease. FINDINGS  Left Ventricle: Left ventricular ejection fraction, by estimation, is 60 to 65%. The left ventricle has normal function. The left ventricle has no regional wall motion abnormalities. The left ventricular internal cavity size was normal in size. There is  mild left ventricular hypertrophy.  Right Ventricle: The right ventricular size is normal. No increase in right ventricular wall thickness. Right ventricular systolic function is normal. Left Atrium: Left atrial size was mildly dilated. No left atrial/left atrial appendage thrombus was detected. Right Atrium: Right atrial size was normal in size. Pericardium: There is no evidence of pericardial effusion. Mitral Valve: The mitral valve is normal in structure. Normal mobility of the mitral valve leaflets. No evidence of mitral valve regurgitation. No evidence of mitral valve stenosis. Tricuspid Valve: The tricuspid valve is normal in structure. Tricuspid valve regurgitation is not demonstrated. No evidence of tricuspid stenosis. Aortic Valve: The aortic valve is normal in structure. Aortic valve regurgitation is not visualized. Mild to moderate aortic valve sclerosis/calcification is present, without any evidence of aortic stenosis. Pulmonic Valve: The pulmonic valve was normal in structure. Pulmonic valve regurgitation is not visualized. No evidence of pulmonic stenosis. Aorta: The aortic root is normal in size and structure. Venous: The inferior vena cava is normal in size with greater than 50% respiratory variability, suggesting right atrial pressure of 3 mmHg. IAS/Shunts: No atrial level shunt detected by color flow Doppler. Agitated saline contrast was given intravenously to evaluate for intracardiac shunting.   AORTA Ao Root diam: 2.80 cm Julien Nordmann MD Electronically signed by Julien Nordmann MD Signature Date/Time: 04/14/2020/4:06:05 PM    Final  Scheduled Meds: .  stroke: mapping our early stages of recovery book   Does not apply Once  .  stroke: mapping our early stages of recovery book   Does not apply Once  . enoxaparin (LOVENOX) injection  40 mg Subcutaneous Q24H  . fluticasone  1 spray Each Nare Daily  . gabapentin  200 mg Oral q AM  . gabapentin  400 mg Oral QHS  . senna  1 tablet Oral QHS  . sodium chloride flush   10-40 mL Intracatheter Q12H   Continuous Infusions: . ampicillin-sulbactam (UNASYN) IV 3 g (04/15/20 1411)     LOS: 5 days    Time spent: 35 minutes    Alberteen Sam, MD [facility] 04/15/2020, 4:34 PM     Please page though AMION or Epic secure chat:  For [employer], Higher education careers adviser

## 2020-04-15 NOTE — Progress Notes (Addendum)
Physical Therapy Treatment Patient Details Name: Stephanie Mason MRN: [id number] DOB: 1950/11/21 Today's Date: 04/15/2020    History of Present Illness Stephanie Mason is a 69 y.o. 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 Patient was in her usual state of health until yesterday when she developed onset of right periorbital headache, slurred speech, and blurry vision in her right eye. 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    PT Comments    Pt was long sitting in bed with RN in room upon arriving. Pt agrees to PT session and is cooperative and pleasant throughout. Supportive significant other arrived during session. Lengthy discussion about pt's PLOF with significant other and pt's daughter(via phone). Per pt and significant other, pt was able to transfer to/from w/c with AD + assistance from significant other. She was able to hold her head in neutral position and was able to sit in W/C throughout the day. Pt currently unable to actively turn head to L past midline, unable to maintain sitting balance. Author concerned about pt falling out of w/c at this time. She has LLE AFO however pt reports she does not use shoe on LLE when wearing it. Education given for importance of wearing AFO in shoe for transfers. No AROM LUE (flaccid). Minimal AROM LLE. Pt required max assist to achieve EOB short sit and then max assist to return to bed. Pt stood pivot towards R with max-total assist of one. Pt's significant other does not feel he can provide the assistance that therapist was able to give for safe transfer at home environment. Therapist concerned with pt going home with HHPT. Feel pt will greatly benefit form SNF/Rehab at [place] to address new onset deficits. Pt agreeable. Pt did sit in recliner however required a lot of pillows  to prevent falling out of chair. RN notified therapist of upcoming MRI and pt returned to bed. Stand pivot to R again with max-total A. Recommend +2 assistance for all transfers until PLOF achieved. Pt was in bed at conclusion of session. RN aware. Discussed with MD that pt will benefit from more aggressive/more frequent  PT at [place] than HHPT/pace can offer.      Follow Up Recommendations  SNF     Equipment Recommendations  None recommended by PT    Recommendations for Other Services       Precautions / Restrictions Precautions Precautions: Fall Required Braces or Orthoses: Other Brace Other Brace: L AFO Restrictions Weight Bearing Restrictions: No    Mobility  Bed Mobility Overal bed mobility: Needs Assistance Bed Mobility: Supine to Sit Rolling: Max assist   Supine to sit: Max assist Sit to supine: Max assist   General bed mobility comments: Max assist to exit and re-enter bed  Transfers Overall transfer level: Needs assistance Equipment used: None (unsafe to transfer with AD. Requires max assist-total) Transfers: Sit to/from Stand Sit to Stand: Max assist;Total assist Stand pivot transfers: Max assist;Total assist;From elevated surface       General transfer comment: Pt was able to stand pivot to Right. pt's significant other in room. She is requiring alot more assistance than prior to admission. pt/pt's significant other agree that rehab is best option  Ambulation/Gait             General Gait Details: unable/unsafe at this time. pt not very ambulatory  at baseline however has significant change in status from prior to admission       Balance Overall balance assessment: Needs assistance Sitting-balance support: Feet unsupported;Single extremity supported Sitting balance-Leahy Scale: Poor Sitting balance - Comments: pt unable to maintain sitting balance without constant assistance   Standing balance support: During functional activity Standing  balance-Leahy Scale: Zero Standing balance comment: required max/total to stand and pivot       Cognition Arousal/Alertness: Awake/alert Behavior During Therapy: WFL for tasks assessed/performed Overall Cognitive Status: Within Functional Limits for tasks assessed      General Comments: pt is alert but has flat affect      Exercises      General Comments        Pertinent Vitals/Pain Pain Assessment: 0-10 Pain Score: 7  Faces Pain Scale: Hurts even more Pain Location: posterior neck, L foot (dorsal surface) Pain Descriptors / Indicators: Moaning;Grimacing;Sharp Pain Intervention(s): Limited activity within patient's tolerance;Monitored during session;Repositioned    Home Living                      Prior Function            PT Goals (current goals can now be found in the care plan section) Acute Rehab PT Goals Patient Stated Goal: to go home Progress towards PT goals: Not progressing toward goals - comment    Frequency    7X/week      PT Plan Discharge plan needs to be updated    Co-evaluation              AM-PAC PT "6 Clicks" Mobility   Outcome Measure  Help needed turning from your back to your side while in a flat bed without using bedrails?: A Lot Help needed moving from lying on your back to sitting on the side of a flat bed without using bedrails?: A Lot Help needed moving to and from a bed to a chair (including a wheelchair)?: A Lot Help needed standing up from a chair using your arms (e.g., wheelchair or bedside chair)?: A Lot Help needed to walk in hospital room?: Total Help needed climbing 3-5 steps with a railing? : Total 6 Click Score: 10    End of Session Equipment Utilized During Treatment: Gait belt Activity Tolerance: Patient limited by fatigue;Patient limited by pain Patient left: in bed;with call bell/phone within reach;with bed alarm set;with family/visitor present Nurse Communication: Mobility status PT Visit  Diagnosis: Unsteadiness on feet (R26.81);Other abnormalities of gait and mobility (R26.89);Muscle weakness (generalized) (M62.81);Other symptoms and signs involving the nervous system (R29.898);Hemiplegia and hemiparesis;Pain Hemiplegia - Right/Left: Left Hemiplegia - dominant/non-dominant: Non-dominant Hemiplegia - caused by: Cerebral infarction Pain - Right/Left: Left Pain - part of body: Knee     Time: 1026-1110 PT Time Calculation (min) (ACUTE ONLY): 44 min  Charges:  $Therapeutic Activity: 23-37 mins $Neuromuscular Re-education: 8-22 mins                     Jetta Lout PTA 04/15/20, 11:54 AM

## 2020-04-15 NOTE — [place] FL2 (Signed)
[place] MEDICAID FL2 LEVEL OF CARE SCREENING TOOL     IDENTIFICATION  Patient Name: Stephanie Mason Birthdate: 16-Dec-1950 Sex: female Admission Date (Current Location): 04/09/2020  [place] and [place] Number:  Chiropodist and Address:  [facility], [address] [place], [place] [address]      Provider Number:    Attending Physician Name and Address:  Alberteen Sam, *  Relative Name and Phone Number:  [name]    Current Level of Care: Hospital Recommended Level of Care: Skilled Nursing Facility Prior Approval Number:    Date Approved/Denied:   PASRR Number:    Discharge Plan: SNF    Current Diagnoses: Patient Active Problem List   Diagnosis Date Noted  . Acute CVA (cerebrovascular accident) (HCC) 04/10/2020  . Stroke (cerebrum) (HCC) 04/10/2020    Orientation RESPIRATION BLADDER Height & Weight     Self, Time, Situation      Weight: 175 lb 6.4 oz (79.6 kg) Height:  5\' 7"  (170.2 cm)  BEHAVIORAL SYMPTOMS/MOOD NEUROLOGICAL BOWEL NUTRITION STATUS           AMBULATORY STATUS COMMUNICATION OF NEEDS Skin   Total Care                           Personal Care Assistance Level of Assistance  Bathing, Feeding, Dressing, Total care Bathing Assistance: Maximum assistance Feeding assistance: Maximum assistance Dressing Assistance: Maximum assistance Total Care Assistance: Maximum assistance   Functional Limitations Info             SPECIAL CARE FACTORS FREQUENCY  PT (By licensed PT), OT (By licensed OT)     PT Frequency: 5 x weekly OT Frequency: 5 x weekly            Contractures      Additional Factors Info  Code Status Code Status Info: Full             Current Medications (04/15/2020):  This is the current hospital active medication list Current Facility-Administered Medications  Medication Dose Route Frequency Provider Last Rate Last Admin  .  stroke: mapping our early stages of recovery  book   Does not apply Once Aroor, 06/15/2020 R, MD      .  stroke: mapping our early stages of recovery book   Does not apply Once Georgiana Spinner, MD      . acetaminophen (TYLENOL) tablet 650 mg  650 mg Oral Q4H PRN Pieter Partridge Tublu, MD   650 mg at 04/15/20 1634   Or  . acetaminophen (TYLENOL) 160 MG/5ML solution 650 mg  650 mg Per Tube Q4H PRN 06/15/20, MD   650 mg at 04/14/20 0555   Or  . acetaminophen (TYLENOL) suppository 650 mg  650 mg Rectal Q4H PRN 06/14/20 Tublu, MD      . Ampicillin-Sulbactam (UNASYN) 3 g in sodium chloride 0.9 % 100 mL IVPB  3 g Intravenous Q6H Leandro Reasoner, MD 200 mL/hr at 04/15/20 1411 3 g at 04/15/20 1411  . butamben-tetracaine-benzocaine (CETACAINE) spray    PRN 06/15/20, MD   1 spray at 04/14/20 1339  . enoxaparin (LOVENOX) injection 40 mg  40 mg Subcutaneous Q24H 06/14/20 Tublu, MD   40 mg at 04/15/20 1020  . fentaNYL (SUBLIMAZE) injection   Intravenous PRN 06/15/20, MD   25 mcg at 04/14/20 1338  . fluticasone (FLONASE) 50 MCG/ACT nasal spray 1 spray  1 spray Each Nare Daily Pieter Partridge, MD   1 spray at 04/15/20 1021  . gabapentin (NEURONTIN) capsule 200 mg  200 mg Oral q AM Leandro Reasoner Tublu, MD   200 mg at 04/15/20 1021  . gabapentin (NEURONTIN) capsule 400 mg  400 mg Oral QHS Leandro Reasoner Tublu, MD   400 mg at 04/14/20 2335  . lidocaine (XYLOCAINE) 2 % jelly    PRN Antonieta Iba, MD   1 application at 04/14/20 1339  . midazolam (VERSED) injection   Intravenous PRN Antonieta Iba, MD   1 mg at 04/14/20 1338  . senna (SENOKOT) tablet 8.6 mg  1 tablet Oral QHS Leandro Reasoner Tublu, MD   8.6 mg at 04/14/20 2335  . sodium chloride flush (NS) 0.9 % injection 10-40 mL  10-40 mL Intracatheter Q12H Leandro Reasoner Tublu, MD   10 mL at 04/15/20 1411  . sodium chloride flush (NS) 0.9 % injection 10-40 mL  10-40 mL Intracatheter PRN Pieter Partridge, MD         Discharge Medications: Please see discharge summary for a list of discharge medications.  Relevant Imaging Results:  Relevant Lab Results:   Additional Information SS# [id number]  Eilleen Kempf, LCSW

## 2020-04-16 LAB — CBC
HCT: 36.1 % (ref 36.0–46.0)
Hemoglobin: 12.1 g/dL (ref 12.0–15.0)
MCH: 34.7 pg — ABNORMAL HIGH (ref 26.0–34.0)
MCHC: 33.5 g/dL (ref 30.0–36.0)
MCV: 103.4 fL — ABNORMAL HIGH (ref 80.0–100.0)
Platelets: 242 10*3/uL (ref 150–400)
RBC: 3.49 MIL/uL — ABNORMAL LOW (ref 3.87–5.11)
RDW: 11.9 % (ref 11.5–15.5)
WBC: 7.3 10*3/uL (ref 4.0–10.5)
nRBC: 0 % (ref 0.0–0.2)

## 2020-04-16 LAB — CULTURE, BLOOD (ROUTINE X 2): Special Requests: ADEQUATE

## 2020-04-16 LAB — PROCALCITONIN: Procalcitonin: 0.49 ng/mL

## 2020-04-16 MED ORDER — SODIUM CHLORIDE 0.9 % IV SOLN
INTRAVENOUS | Status: DC | PRN
  Administered 2020-04-16: 250 mL via INTRAVENOUS
  Administered 2020-04-16: 10 mL via INTRAVENOUS
  Administered 2020-04-18: 250 mL via INTRAVENOUS

## 2020-04-16 MED ORDER — CYCLOBENZAPRINE HCL 10 MG PO TABS
5.0000 mg | ORAL_TABLET | Freq: Three times a day (TID) | ORAL | Status: DC
  Administered 2020-04-16 – 2020-04-20 (×11): 5 mg via ORAL
  Filled 2020-04-16 (×6): qty 1
  Filled 2020-04-16: qty 0.5
  Filled 2020-04-16 (×5): qty 1

## 2020-04-16 NOTE — Progress Notes (Addendum)
Reason for consult: Stroke  Subjective: Patient awake and alert, complains of mild neck pain.    ROS: negative except above   Examination  Vital signs in last 24 hours: Temp:  [98.4 F (36.9 C)-103 F (39.4 C)] 98.4 F (36.9 C) (09/05 0804) Pulse Rate:  [81-90] 81 (09/05 0804) Resp:  [14-20] 14 (09/05 0804) BP: (132-160)/(67-86) 160/86 (09/05 0804) SpO2:  [95 %-100 %] 99 % (09/05 0804)  General: lying in bed CVS: pulse-normal rate and rhythm RS: breathing comfortably Extremities: normal   Neuro: MS: Alert, oriented, follows commands CN: pupils equal and reactive,  EOMI,left facial droop, tongue midline, normal sensation over face, Motor: 5/5 strength in RUE, RLE, 0/5 in LUE, 2/5 LLE Reflexes: brisk refelxes in left biceps and patella, ankle clonus in LLE Coordination: normal on the right side  Gait: not tested  Basic Metabolic Panel: Recent Labs  Lab 04/09/20 2131 04/09/20 2131 04/13/20 0443 04/14/20 0422 04/15/20 0514  NA 139  --  131* 133* 133*  K 4.3  --  3.5 3.3* 3.4*  CL 102  --  95* 98 98  CO2 28  --  25 26 27   GLUCOSE 148*  --  158* 148* 138*  BUN 10  --  9 8 11   CREATININE 0.88  --  0.81 0.63 0.80  CALCIUM 9.6   < > 8.3* 8.3* 8.3*   < > = values in this interval not displayed.    CBC: Recent Labs  Lab 04/09/20 2131 04/13/20 0443 04/14/20 0422 04/15/20 0514 04/16/20 0344  WBC 8.3 7.7 7.6 7.6 7.3  NEUTROABS 7.1  --   --   --   --   HGB 15.8* 14.1 13.6 13.0 12.1  HCT 48.6* 40.9 39.4 39.0 36.1  MCV 107.5* 103.0* 102.3* 103.7* 103.4*  PLT 213 181 201 216 242     Coagulation Studies: No results for input(s): LABPROT, INR in the last 72 hours.  Imaging Reviewed:     ASSESSMENT AND PLAN  69 y.o. 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 MRI brain confirms large right parietal and occipital lobe infarctions consistent with inferior division right MCA and PCA terrioty infarction-likely due to right PCA territory supplied by large posterior communicating artery.  Infarction is fairly large, however due to atrophy and area right encephalomalacia there appears to be space for expansison and hence patient is low risk for developing malignant cerebral edema.  Etiology of stroke: Cryptogenic, suspect right 2/2 carotid disease vs embolic vshypercoagulable state.  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 The right ICA becomes occluded at the cavernous segment and remains occluded through the supraclinoid segment. . There is a fetal origin right posterior cerebral artery with occluded right posterior communicating artery. In the neck: soft plaque within the right carotid bifurcation/ICA bulb without hemodynamically significant stenosis. However, distal to this, the right cervical ICA is markedly asymmetrically diminutive. #MRI Brain: 1. Acute inferior division right MCA territory infarct. Acute right PCA territory infarct and a large posterior communicating artery is present on  the right.  #Hypercoagulable workup negative  # blood cx: 1/4 cultures with CoNS, likely contaminant.   #Transthoracic Echo: no LV thrombus, EF 60 to 65 % # TEE: no PFO, vegetation or thrombus   Recommendations # Start patient on ASA 81mg  daily # statin held due to transaminitis/intolerance # flexeril for torticollis, spasticity consider outpatient botox # BP goal: normotension # Telemetry monitoring # Frequent  neuro checks #Loop recorder to monitor for paroxysmal Afib   Outpatient neurology follow up in 2-4 weeks    [place]wood [facility] Pager Number [phone] For questions after 7pm please refer to AMION to reach the Neurologist on call

## 2020-04-16 NOTE — Progress Notes (Signed)
[facility] PROGRESS NOTE    Stephanie MedicusClairstine Mason  ATF:[id number] DOB: 1950/12/20 DOA: 04/09/2020 PCP: Patient, No Pcp Per      Brief Narrative:  Stephanie Mason is a 69 y.o. F with hx recurrent stroke and left sided hemiparesis who presented with worsening left sided weakness.  In the ER, CT head showed new acute right sided strokes.     Assessment & Plan:  Acute RIGHT MCA/PCA stroke -Non-invasive angiography showed occluded RIGHT ICA -Transesophageal echocardiogram showed no cardiogenic source of embolism -Statin contraindicated due to prior intolerance -Aspirin ordered at admission --> continue low-dose aspirin -Atrial fibrillation: not present, Zio patch pending -tPA not given because outside the window -Dysphagia screen ordered in ER -PT eval ordered: recommended SNF -Smoking cessation: not pertinent    Fever Etiology unclear.  It was presumed infection and so Unasyn was started.    Source of infection not obvious.  Foot x-ray normal and exam benign to me.  No meningismus, doubt meningitis.  No URI symptoms to suggest respiratory virus.  Sinusitis on CT but no sinus symptoms or signs on exam, doubt sinusitis.  TEE and Bcx rules out endocarditis.  MRI neck unremarkable.  Fever again yesterday afternoon, none today.  Clinically seems to be improving.  Procalcitonin trending down. -Continue Unasyn, day 4      Hypokalemia -Repeat K    Blood culture positive 1/4 cultures with CoNS, likely contaminant.    Torticollis This is generally a benign condition. Not associated with infection or fever (in the absence of any acute dystonia which she does not have).  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   The patient was admitted with new large strokes.  We suspect these are embolic in origin, although no obvious source can be found, no confirmation of arrhythmia has occurred yet.  She was previously able to participate in self-cares, and stand for transfers, but is present unable to stand, and is maximal assist for any movement, will need significant rehabilitation to improve to her prior level of function.           MDM: The below labs and imaging reports were reviewed and summarized above.  Medication management as above.    DVT prophylaxis: enoxaparin (LOVENOX) injection 40 mg Start: 04/10/20 1100  Code Status: FULL Family Communication: Husband at the bedside    Consultants:   Neurology  Cardiology  Infectious disease  Procedures:   TEE  Antimicrobials:   Unasyn 9/2 >>   Culture data:   9/1 blood culture -- CoNS in 1/4  9/2 blood culture -- no growth           Subjective: No fever this morning.  No confusion or headache, no vomiting, diarrhea, respiratory distress.  No abdominal pain, no dysuria.  She has some tenderness in her left knee, with exam of her  left knee is normal.  Objective: Vitals:   04/15/20 1627 04/15/20 1830 04/15/20 2016 04/16/20 0804  BP: (!) 147/67  132/67 (!) 160/86  Pulse: 90  81 81  Resp: 18  20 14   Temp: (!) 103 F (39.4 C) 98.6 F (37 C) 99.3 F (37.4 C) 98.4 F (36.9 C)  TempSrc: Oral Oral Oral Oral  SpO2: 100%  95% 99%  Weight:      Height:        Intake/Output Summary (Last 24 hours) at 04/16/2020 1624 Last data filed at 04/16/2020 1525 Gross per 24 hour  Intake 0 ml  Output --  Net 0 ml   Filed Weights   04/14/20 0505 04/14/20 1255 04/15/20 0508   Weight: 75.7 kg 75.7 kg 79.6 kg    Examination: General appearance: Thin elderly female, lying in bed, watching television, no obvious distress, interactive     HEENT:    Skin: Skin warm and dry, no suspicious rashes of bilateral feet, left leg, knee. Cardiac: RRR, no murmurs, no lower extremity edema Respiratory: Normal respiratory rate and rhythm, lungs clear without rales or wheezes, respirations shallow. Abdomen: No tenderness palpation or guarding.  No ascites or distention. MSK: Left-sided contractures, torticollis to the right still, no meningismus. Neuro: Awake and alert, extraocular movements intact see mostly intact, right-sided facial droop, severe left-sided hemiparesis, speech fluent Psych: Sensorium intact responding to questions, attention seems diminished, affect blunted, judgment insight appear normal     Data Reviewed: I have personally reviewed following labs and imaging studies:  CBC: Recent Labs  Lab 04/09/20 2131 04/13/20 0443 04/14/20 0422 04/15/20 0514 04/16/20 0344  WBC 8.3 7.7 7.6 7.6 7.3  NEUTROABS 7.1  --   --   --   --   HGB 15.8* 14.1 13.6 13.0 12.1  HCT 48.6* 40.9 39.4 39.0 36.1  MCV 107.5* 103.0* 102.3* 103.7* 103.4*  PLT 213 181 201 216 242   Basic Metabolic Panel: Recent Labs  Lab 04/09/20 2131 04/13/20 0443 04/14/20 0422 04/15/20 0514  NA 139 131* 133* 133*  K 4.3 3.5 3.3* 3.4*  CL 102 95* 98 98  CO2 28 25 26 27   GLUCOSE 148* 158* 148* 138*  BUN 10 9 8 11   CREATININE 0.88 0.81 0.63 0.80  CALCIUM 9.6 8.3* 8.3* 8.3*   GFR: Estimated Creatinine Clearance: 75.1 mL/min (by C-G formula based on SCr of 0.8 mg/dL). Liver Function Tests: No results for input(s): AST, ALT, ALKPHOS, BILITOT, PROT, ALBUMIN in the last 168 hours. No results for input(s): LIPASE, AMYLASE in the last 168 hours. No results for input(s): AMMONIA in the last 168 hours. Coagulation Profile: No results for input(s): INR, PROTIME in the last 168 hours. Cardiac  Enzymes: No results for input(s): CKTOTAL, CKMB, CKMBINDEX, TROPONINI in the last 168 hours. BNP (last 3 results) No results for input(s): PROBNP in the last 8760 hours. HbA1C: No results for input(s): HGBA1C in the last 72 hours. CBG: Recent Labs  Lab 04/15/20 0820 04/15/20 2138  GLUCAP 93 148*   Lipid Profile: No results for input(s): CHOL, HDL, LDLCALC, TRIG, CHOLHDL, LDLDIRECT in the last 72 hours. Thyroid Function Tests: No results for input(s): TSH, T4TOTAL, FREET4, T3FREE, THYROIDAB in the last 72 hours. Anemia Panel: No results for input(s): VITAMINB12, FOLATE, FERRITIN, TIBC, IRON, RETICCTPCT in the last 72 hours. Urine analysis: No results found for: COLORURINE, APPEARANCEUR, LABSPEC, PHURINE, GLUCOSEU, HGBUR, BILIRUBINUR, KETONESUR, PROTEINUR, UROBILINOGEN, NITRITE, LEUKOCYTESUR Sepsis Labs: @LABRCNTIP (procalcitonin:4,lacticacidven:4)  ) Recent Results (from the past 240  hour(s))  SARS Coronavirus 2 by RT PCR (hospital order, performed in [facility] hospital lab) Nasopharyngeal Nasopharyngeal Swab     Status: None   Collection Time: 04/10/20 10:58 AM   Specimen: Nasopharyngeal Swab  Result Value Ref Range Status   SARS Coronavirus 2 NEGATIVE NEGATIVE Final    Comment: (NOTE) SARS-CoV-2 target nucleic acids are NOT DETECTED.  The SARS-CoV-2 RNA is generally detectable in upper and lower respiratory specimens during the acute phase of infection. The lowest concentration of SARS-CoV-2 viral copies this assay can detect is 250 copies / mL. A negative result does not preclude SARS-CoV-2 infection and should not be used as the sole basis for treatment or other patient management decisions.  A negative result may occur with improper specimen collection / handling, submission of specimen other than nasopharyngeal swab, presence of viral mutation(s) within the areas targeted by this assay, and inadequate number of viral copies (<250 copies / mL). A negative result must  be combined with clinical observations, patient history, and epidemiological information.  Fact Sheet for Patients:   BoilerBrush.com.cy  Fact Sheet for Healthcare Providers: https://pope.com/  This test is not yet approved or  cleared by the [place] FDA and has been authorized for detection and/or diagnosis of SARS-CoV-2 by FDA under an Emergency Use Authorization (EUA).  This EUA will remain in effect (meaning this test can be used) for the duration of the COVID-19 declaration under Section 564(b)(1) of the Act, 21 U.S.C. section 360bbb-3(b)(1), unless the authorization is terminated or revoked sooner.  Performed at [facility], [address] [place], [place] [address]   Culture, blood (Routine X 2) w Reflex to ID Panel     Status: Abnormal   Collection Time: 04/12/20 10:43 AM   Specimen: BLOOD  Result Value Ref Range Status   Specimen Description   Final    BLOOD BACK OF LEFT HAND Performed at [facility], [address], [place], [place] [address]    Special Requests   Final    BOTTLES DRAWN AEROBIC AND ANAEROBIC Blood Culture adequate volume Performed at [facility], [address] [place], [place] [address]    Culture  Setup Time   Final    Organism ID to follow GRAM POSITIVE COCCI ANAEROBIC BOTTLE ONLY CRITICAL RESULT CALLED TO, READ BACK BY AND VERIFIED WITH: Princella Ion [facility] 2153 04/13/20 HNM Performed at [facility], [address] [place], [place] [address]    Culture (A)  Final    STAPHYLOCOCCUS AURICULARIS THE SIGNIFICANCE OF ISOLATING THIS ORGANISM FROM A SINGLE SET OF BLOOD CULTURES WHEN MULTIPLE SETS ARE DRAWN IS UNCERTAIN. PLEASE NOTIFY THE MICROBIOLOGY DEPARTMENT WITHIN ONE WEEK IF SPECIATION AND SENSITIVITIES ARE REQUIRED. Performed at [facility], [address], [place], [place] [address]    Report Status 04/16/2020 FINAL  Final  Blood Culture  ID Panel (Reflexed)     Status: Abnormal   Collection Time: 04/12/20 10:43 AM  Result Value Ref Range Status   Enterococcus faecalis NOT DETECTED NOT DETECTED Final   Enterococcus Faecium NOT DETECTED NOT DETECTED Final   Listeria monocytogenes NOT DETECTED NOT DETECTED Final   Staphylococcus species DETECTED (A) NOT DETECTED Final    Comment: CRITICAL RESULT CALLED TO, READ BACK BY AND VERIFIED WITH: JASON ROBBINS PHARMD 2153 04/13/20 HNM    Staphylococcus aureus (BCID) NOT DETECTED NOT DETECTED Final   Staphylococcus epidermidis NOT DETECTED NOT DETECTED Final   Staphylococcus lugdunensis NOT DETECTED NOT DETECTED Final  Streptococcus species NOT DETECTED NOT DETECTED Final   Streptococcus agalactiae NOT DETECTED NOT DETECTED Final   Streptococcus pneumoniae NOT DETECTED NOT DETECTED Final   Streptococcus pyogenes NOT DETECTED NOT DETECTED Final   A.calcoaceticus-baumannii NOT DETECTED NOT DETECTED Final   Bacteroides fragilis NOT DETECTED NOT DETECTED Final   Enterobacterales NOT DETECTED NOT DETECTED Final   Enterobacter cloacae complex NOT DETECTED NOT DETECTED Final   Escherichia coli NOT DETECTED NOT DETECTED Final   Klebsiella aerogenes NOT DETECTED NOT DETECTED Final   Klebsiella oxytoca NOT DETECTED NOT DETECTED Final   Klebsiella pneumoniae NOT DETECTED NOT DETECTED Final   Proteus species NOT DETECTED NOT DETECTED Final   Salmonella species NOT DETECTED NOT DETECTED Final   Serratia marcescens NOT DETECTED NOT DETECTED Final   Haemophilus influenzae NOT DETECTED NOT DETECTED Final   Neisseria meningitidis NOT DETECTED NOT DETECTED Final   Pseudomonas aeruginosa NOT DETECTED NOT DETECTED Final   Stenotrophomonas maltophilia NOT DETECTED NOT DETECTED Final   Candida albicans NOT DETECTED NOT DETECTED Final   Candida auris NOT DETECTED NOT DETECTED Final   Candida glabrata NOT DETECTED NOT DETECTED Final   Candida krusei NOT DETECTED NOT DETECTED Final   Candida  parapsilosis NOT DETECTED NOT DETECTED Final   Candida tropicalis NOT DETECTED NOT DETECTED Final   Cryptococcus neoformans/gattii NOT DETECTED NOT DETECTED Final    Comment: Performed at [facility], [address] [place], [place] [address]  Culture, blood (Routine X 2) w Reflex to ID Panel     Status: None (Preliminary result)   Collection Time: 04/12/20 12:47 PM   Specimen: BLOOD  Result Value Ref Range Status   Specimen Description BLOOD BLOOD LEFT WRIST  Final   Special Requests   Final    BOTTLES DRAWN AEROBIC AND ANAEROBIC Blood Culture adequate volume   Culture   Final    NO GROWTH 4 DAYS Performed at [facility], [address] [place], [place] [address]    Report Status PENDING  Incomplete  Culture, blood (Routine X 2) w Reflex to ID Panel     Status: None (Preliminary result)   Collection Time: 04/13/20  2:34 PM   Specimen: BLOOD  Result Value Ref Range Status   Specimen Description BLOOD BLOOD LEFT HAND  Final   Special Requests   Final    BOTTLES DRAWN AEROBIC AND ANAEROBIC Blood Culture adequate volume   Culture   Final    NO GROWTH 3 DAYS Performed at [facility], [address] [place], [place] [address]    Report Status PENDING  Incomplete  Culture, blood (Routine X 2) w Reflex to ID Panel     Status: None (Preliminary result)   Collection Time: 04/13/20  4:18 PM   Specimen: BLOOD  Result Value Ref Range Status   Specimen Description BLOOD BLOOD LEFT HAND  Final   Special Requests   Final    BOTTLES DRAWN AEROBIC ONLY Blood Culture adequate volume   Culture   Final    NO GROWTH 3 DAYS Performed at [facility], [address], [place], [place] [address]    Report Status PENDING  Incomplete         Radiology Studies: MR NECK W WO CONTRAST  Result Date: 04/15/2020 CLINICAL DATA:  Neck abscess.  Fever.  Fever of unknown origin EXAM: MRI OF THE NECK WITH CONTRAST TECHNIQUE: Multiplanar, multisequence MR  imaging was performed following the administration of intravenous contrast. CONTRAST:  7.59mL GADAVIST GADOBUTROL  1 MMOL/ML IV SOLN COMPARISON:  CTA head neck from 04/10/2020 FINDINGS: Pharynx and larynx: No evidence of mass or inflammation Salivary glands: Normal appearing and essentially symmetric. Thyroid: No mass or enlargement. Lymph nodes: None enlarged or abnormal signal. Vascular: Recent CTA.  No interval findings. Limited intracranial: Negative Visualized orbits: Trace coverage without detected abnormality Mastoids and visualized paranasal sinuses: Right maxillary sinusitis with mucosal thickening and central fluid. Skeleton: No acute or erosive finding. Upper chest: Negative IMPRESSION: No evidence of inflammation or infection in the neck. Negative for abscess. Electronically Signed   By: Marnee Spring M.D.   On: 04/15/2020 14:12        Scheduled Meds: .  stroke: mapping our early stages of recovery book   Does not apply Once  .  stroke: mapping our early stages of recovery book   Does not apply Once  . enoxaparin (LOVENOX) injection  40 mg Subcutaneous Q24H  . fluticasone  1 spray Each Nare Daily  . gabapentin  200 mg Oral q AM  . gabapentin  400 mg Oral QHS  . senna  1 tablet Oral QHS  . sodium chloride flush  10-40 mL Intracatheter Q12H   Continuous Infusions: . sodium chloride 250 mL (04/16/20 1219)  . ampicillin-sulbactam (UNASYN) IV 3 g (04/16/20 1220)     LOS: 6 days    Time spent: 35 minutes    Alberteen Sam, MD [facility] 04/16/2020, 4:24 PM     Please page though AMION or Epic secure chat:  For [employer], Higher education careers adviser

## 2020-04-16 NOTE — Progress Notes (Signed)
Physical Therapy Treatment Patient Details Name: Stephanie Mason MRN: [id number] DOB: September 19, 1950 Today's Date: 04/16/2020    History of Present Illness Stephanie Mason is a 69 y.o. 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 Patient was in her usual state of health until yesterday when she developed onset of right periorbital headache, slurred speech, and blurry vision in her right eye. 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    PT Comments    Pt ready for session.  To EOB with max  A x 1.  She is able to sit unsupported for short periods of time with close supervision.  She sits for about 20 minutes this date before fatigue.  Participated in exercises as described below.  She remains with LLE pain. While sitting, focused on stretching and ROM to neck and she is able to look to L and maintain midline at end of session and education to be aware of positioning.  Attempted transfer to chair but she required max a x 2 to attempt and did not clear bed before yelling out over LLE.  She is assisted back to bed and put on bedpan per her request.   Follow Up Recommendations  SNF     Equipment Recommendations  None recommended by PT    Recommendations for Other Services       Precautions / Restrictions Precautions Precautions: Fall Required Braces or Orthoses: Other Brace Other Brace: L AFO Restrictions Weight Bearing Restrictions: No    Mobility  Bed Mobility Overal bed mobility: Needs Assistance Bed Mobility: Supine to Sit Rolling: Max assist   Supine to sit: Max assist Sit to supine: Max assist      Transfers Overall transfer level: Needs assistance Equipment used: None   Sit to Stand: Max assist;Total assist         General transfer comment: unable to stand  Ambulation/Gait                  Stairs             Wheelchair Mobility    Modified Rankin (Stroke Patients Only)       Balance Overall balance assessment: Needs assistance Sitting-balance support: Feet unsupported;Single extremity supported Sitting balance-Leahy Scale: Poor Sitting balance - Comments: sitting balance improved today and able to remain sitting for brief periods with close supervision at times.     Standing balance-Leahy Scale: Zero                              Cognition Arousal/Alertness: Awake/alert Behavior During Therapy: WFL for tasks assessed/performed;Flat affect Overall Cognitive Status: Within Functional Limits for tasks assessed                                        Exercises Other Exercises Other Exercises: neck ROM and stretching    General Comments        Pertinent Vitals/Pain Pain Assessment: Faces Faces Pain Scale: Hurts whole lot Pain Location: posterior neck, L LE Pain Descriptors / Indicators: Grimacing Pain Intervention(s): Limited activity within patient's tolerance;Monitored during session;Repositioned    Home Living  Prior Function            PT Goals (current goals can now be found in the care plan section) Progress towards PT goals: Progressing toward goals    Frequency    7X/week      PT Plan Current plan remains appropriate    Co-evaluation              AM-PAC PT "6 Clicks" Mobility   Outcome Measure  Help needed turning from your back to your side while in a flat bed without using bedrails?: Total Help needed moving from lying on your back to sitting on the side of a flat bed without using bedrails?: Total Help needed moving to and from a bed to a chair (including a wheelchair)?: Total Help needed standing up from a chair using your arms (e.g., wheelchair or bedside chair)?: Total Help needed to walk in hospital room?: Total Help needed climbing 3-5 steps  with a railing? : Total 6 Click Score: 6    End of Session Equipment Utilized During Treatment: Gait belt Activity Tolerance: Patient tolerated treatment well Patient left: in bed;with call bell/phone within reach;with bed alarm set;with family/visitor present Nurse Communication: Mobility status Hemiplegia - Right/Left: Left Hemiplegia - dominant/non-dominant: Non-dominant Hemiplegia - caused by: Cerebral infarction Pain - Right/Left: Left Pain - part of body: Knee     Time: 7001-7494 PT Time Calculation (min) (ACUTE ONLY): 35 min  Charges:  $Therapeutic Exercise: 8-22 mins $Therapeutic Activity: 8-22 mins                    Danielle Dess, PTA 04/16/20, 1:01 PM

## 2020-04-17 ENCOUNTER — Inpatient Hospital Stay: Payer: Medicare (Managed Care)

## 2020-04-17 ENCOUNTER — Encounter: Payer: Self-pay | Admitting: Internal Medicine

## 2020-04-17 LAB — CBC
HCT: 37.3 % (ref 36.0–46.0)
Hemoglobin: 12.6 g/dL (ref 12.0–15.0)
MCH: 34.7 pg — ABNORMAL HIGH (ref 26.0–34.0)
MCHC: 33.8 g/dL (ref 30.0–36.0)
MCV: 102.8 fL — ABNORMAL HIGH (ref 80.0–100.0)
Platelets: 308 10*3/uL (ref 150–400)
RBC: 3.63 MIL/uL — ABNORMAL LOW (ref 3.87–5.11)
RDW: 12.1 % (ref 11.5–15.5)
WBC: 7.7 10*3/uL (ref 4.0–10.5)
nRBC: 0 % (ref 0.0–0.2)

## 2020-04-17 LAB — URINE CULTURE: Culture: NO GROWTH

## 2020-04-17 LAB — BASIC METABOLIC PANEL
Anion gap: 8 (ref 5–15)
BUN: 9 mg/dL (ref 8–23)
CO2: 26 mmol/L (ref 22–32)
Calcium: 8.6 mg/dL — ABNORMAL LOW (ref 8.9–10.3)
Chloride: 95 mmol/L — ABNORMAL LOW (ref 98–111)
Creatinine, Ser: 0.63 mg/dL (ref 0.44–1.00)
GFR calc Af Amer: >60 mL/min (ref >60)
GFR calc non Af Amer: >60 mL/min (ref >60)
Glucose, Bld: 138 mg/dL — ABNORMAL HIGH (ref 70–99)
Potassium: 4.2 mmol/L (ref 3.5–5.1)
Sodium: 129 mmol/L — ABNORMAL LOW (ref 135–145)

## 2020-04-17 LAB — CREATININE, SERUM
Creatinine, Ser: 0.59 mg/dL (ref 0.44–1.00)
GFR calc Af Amer: >60 mL/min (ref >60)
GFR calc non Af Amer: >60 mL/min (ref >60)

## 2020-04-17 LAB — CULTURE, BLOOD (ROUTINE X 2)
Culture: NO GROWTH
Special Requests: ADEQUATE

## 2020-04-17 LAB — GLUCOSE, CAPILLARY: Glucose-Capillary: 96 mg/dL (ref 70–99)

## 2020-04-17 MED ORDER — MORPHINE SULFATE (PF) 4 MG/ML IV SOLN
4.0000 mg | Freq: Once | INTRAVENOUS | Status: AC
  Administered 2020-04-17: 4 mg via INTRAVENOUS
  Filled 2020-04-17 (×2): qty 1

## 2020-04-17 MED ORDER — IOHEXOL 300 MG/ML  SOLN
100.0000 mL | Freq: Once | INTRAMUSCULAR | Status: AC | PRN
  Administered 2020-04-17: 100 mL via INTRAVENOUS

## 2020-04-17 MED ORDER — SENNOSIDES-DOCUSATE SODIUM 8.6-50 MG PO TABS
1.0000 | ORAL_TABLET | Freq: Two times a day (BID) | ORAL | Status: DC
  Administered 2020-04-18 (×2): 1 via ORAL
  Filled 2020-04-17 (×3): qty 1

## 2020-04-17 MED ORDER — IOHEXOL 9 MG/ML PO SOLN
500.0000 mL | ORAL | Status: AC
  Administered 2020-04-17: 500 mL via ORAL

## 2020-04-17 MED ORDER — POLYETHYLENE GLYCOL 3350 17 G PO PACK
17.0000 g | PACK | Freq: Every day | ORAL | Status: DC
  Administered 2020-04-17 – 2020-04-18 (×2): 17 g via ORAL
  Filled 2020-04-17 (×2): qty 1

## 2020-04-17 NOTE — Progress Notes (Signed)
Physical Therapy Treatment Patient Details Name: Stephanie Mason MRN: [id number] DOB: 12-21-1950 Today's Date: 04/17/2020    History of Present Illness Stephanie Mason is a 69 y.o. 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 Patient was in her usual state of health until yesterday when she developed onset of right periorbital headache, slurred speech, and blurry vision in her right eye. 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    PT Comments    Pt was pleasant and put forth good effort during the session but was quite limited with all functional tasks.  Pt was found in supine with LLE in position of extreme hip and knee flexion.  Significant time taken secondary to pain with movement to gradually straighten the left leg using contract/relax and gentle PROM with nursing education provided for positioning to maintain improved position.  Pt required total assist for bed mobility tasks and once in sitting at the EOB required frequent assist to prevent LOB but could occasionally maintain static sitting position independently.  Pt was able to slightly unweight bottom from EOB x 5 but was unable to clear the surface of the bed during transfer training.  Pt will benefit from PT services in a SNF setting upon discharge to safely address deficits listed in patient problem list for decreased caregiver assistance and eventual return to PLOF.     Follow Up Recommendations  SNF     Equipment Recommendations  None recommended by PT    Recommendations for Other Services       Precautions / Restrictions Precautions Precautions: Fall Required Braces or Orthoses: Other Brace Other Brace: L AFO Restrictions Weight Bearing Restrictions: No    Mobility  Bed Mobility Overal bed mobility: Needs Assistance Bed Mobility:  Supine to Sit;Sit to Supine Rolling: +2 for physical assistance;Max assist   Supine to sit: +2 for physical assistance;Max assist Sit to supine: Max assist;+2 for physical assistance   General bed mobility comments: +2 max A for BLE and trunk control  Transfers                 General transfer comment: Pt able to unweight bottom from mattress but unable to clear the surface  Ambulation/Gait             General Gait Details: Unable/unsafe to attempt   Stairs             Wheelchair Mobility    Modified Rankin (Stroke Patients Only)       Balance Overall balance assessment: Needs assistance Sitting-balance support: Feet unsupported;Single extremity supported Sitting balance-Leahy Scale: Poor Sitting balance - Comments: Frequent min to mod A to maintain static sitting balance                                    Cognition Arousal/Alertness: Awake/alert Behavior During Therapy: Flat affect Overall Cognitive Status: Within Functional Limits for tasks assessed                                        Exercises Total Joint Exercises Ankle Circles/Pumps: AROM;Strengthening;Right;5 reps;10 reps Quad Sets: Strengthening;Right;5 reps;10 reps Gluteal Sets: Strengthening;10 reps;Both Hip ABduction/ADduction: AAROM;Right;10 reps Straight Leg  Raises: AAROM;Right;10 reps Other Exercises Other Exercises: Contract/relax and PROM for LLE hip and knee ext Other Exercises: Positioning education with CNA and nursing to encourage L hip and knee ext Other Exercises: L cervical max AROM rotation to the left x 10    General Comments        Pertinent Vitals/Pain Pain Assessment: 0-10 Pain Score: 4  Pain Location: Stomach Pain Descriptors / Indicators: Other (Comment) (bloated) Pain Intervention(s): Premedicated before session;Monitored during session;Patient requesting pain meds-RN notified    Home Living                       Prior Function            PT Goals (current goals can now be found in the care plan section) Progress towards PT goals: Progressing toward goals    Frequency    7X/week      PT Plan Current plan remains appropriate    Co-evaluation              AM-PAC PT "6 Clicks" Mobility   Outcome Measure  Help needed turning from your back to your side while in a flat bed without using bedrails?: Total Help needed moving from lying on your back to sitting on the side of a flat bed without using bedrails?: Total Help needed moving to and from a bed to a chair (including a wheelchair)?: Total Help needed standing up from a chair using your arms (e.g., wheelchair or bedside chair)?: Total Help needed to walk in hospital room?: Total Help needed climbing 3-5 steps with a railing? : Total 6 Click Score: 6    End of Session Equipment Utilized During Treatment: Gait belt Activity Tolerance: Patient tolerated treatment well Patient left: in bed;with call bell/phone within reach;with bed alarm set;with nursing/sitter in room Nurse Communication: Mobility status;Other (comment) (Positioning education per above) PT Visit Diagnosis: Unsteadiness on feet (R26.81);Other abnormalities of gait and mobility (R26.89);Muscle weakness (generalized) (M62.81);Other symptoms and signs involving the nervous system (R29.898);Hemiplegia and hemiparesis;Pain Hemiplegia - Right/Left: Left Hemiplegia - dominant/non-dominant: Non-dominant Hemiplegia - caused by: Cerebral infarction Pain - Right/Left: Left Pain - part of body: Knee     Time: 0935-1003 PT Time Calculation (min) (ACUTE ONLY): 28 min  Charges:  $Therapeutic Exercise: 8-22 mins $Therapeutic Activity: 8-22 mins                     D. Scott Saurabh Hettich PT, DPT 04/17/20, 10:50 AM

## 2020-04-17 NOTE — Progress Notes (Signed)
ID    Date of Admission:  04/09/2020  H/o CVA, HTN  Admitted with  Rt sided headache .nausea.vomiting and blurred visionon 04/09/20 Vitals in the ED were  Temp 99,1. BP 170/81, HR 72 Labs were WBC 8.3, HB 15.8, MCV 107.5 CT head and maxillofacial - Large area of encephalomalacia involving the right MCA territory with hypodensity involving the right posterior parietal lobe likely chronic or subacute infarct. If clinical concern remains would recommend MRI for further evaluation.  Findings suggestive of right maxillary sinusitis.    MRI brain - 1. Acute inferior division right MCA territory infarct. Acute right PCA territory infarct (which occurred even more recently based on the prior head CT). 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 2. Pre-existing right ACA and upper division MCA territory infarcts. 3. 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 female Active Problems:   Acute CVA (cerebrovascular accident) (HCC)   Stroke (cerebrum) (HCC)    Subjective: Somnolent On waking her she is responding to questions Says pain neck is better Has a neck pillow Son at bedside   Medications:  .  stroke: mapping our early stages of recovery book   Does not apply Once  .  stroke: mapping our early stages of recovery book   Does not apply Once  . cyclobenzaprine  5 mg Oral TID  . enoxaparin (LOVENOX) injection  40 mg Subcutaneous Q24H  . fluticasone  1 spray Each Nare Daily  . gabapentin  200 mg Oral q AM  . gabapentin  400 mg Oral QHS  . polyethylene glycol  17 g Oral Daily  . senna-docusate  1 tablet Oral BID  . sodium chloride flush  10-40 mL Intracatheter Q12H    Objective: Vital signs in last 24 hours: Patient Vitals for the past 24 hrs:  BP Temp Temp src Pulse Resp SpO2  04/17/20 1600 -- (!) 101 F (38.3 C) Axillary -- -- --  04/17/20 1544 (!) 162/79  (!) 102.1 F (38.9 C) Axillary 81 18 97 %  04/17/20 1141 (!) 150/74 99.1 F (37.3 C) Oral 89 16 96 %  04/17/20 0758 (!) 159/83 98.1 F (36.7 C) Oral 80 16 97 %  04/16/20 2107 139/72 (!) 102.4 F (39.1 C) Oral 93 -- 100 %  04/16/20 1832 (!) 163/70 99.2 F (37.3 C) Oral 93 (!) 22 97 %  04/16/20 1639 (!) 150/73 (!) 100.4 F (38 C) Oral 86 20 99 %     PHYSICAL EXAM:  General: somnolent but oriented x4, answers questions appropriately Head/neck  turned towards rt-  , cooperative, no distress, appears stated age.  Head: Normocephalic, without obvious abnormality, atraumatic. Eyes:no diplopia, divergent eyes ENT Nares normal. No drainage or sinus tenderness. Tongue coated Neck: troticollis rt Back: No CVA tenderness. Lungs: Clear to auscultation bilaterally. No Wheezing or Rhonchi. No rales. Heart: Regular rate and rhythm, no murmur, rub or gallop. Abdomen: Soft, non-tender,not distended. Bowel sounds normal. No masses Extremities: atraumatic, no cyanosis. No edema. No clubbing Skin: No rashes or lesions. Or bruising Lymph: Cervical, supraclavicular normal. Neurologic:left hemiplegia  Lab Results Recent Labs    04/15/20 0514 04/15/20 0514 04/16/20 0344 04/17/20 0521 04/17/20 1413  WBC 7.6   < > 7.3  --  7.7  HGB 13.0   < > 12.1  --  12.6  HCT 39.0   < >  36.1  --  37.3  NA 133*  --   --   --  129*  K 3.4*  --   --   --  4.2  CL 98  --   --   --  95*  CO2 27  --   --   --  26  BUN 11  --   --   --  9  CREATININE 0.80   < >  --  0.59 0.63   < > = values in this interval not displayed.  Microbiology: 1 of 4- coag neg staph HIV neg Studies/Results:  TEE - no vegetation  DG Chest 2 View  Result Date: 04/17/2020 CLINICAL DATA:  Fever EXAM: CHEST - 2 VIEW COMPARISON:  04/13/2020 FINDINGS: Mild patchy bibasilar opacities, suspicious for atelectasis. No frank interstitial edema. No pleural effusion or pneumothorax. Mild eventration of the right hemidiaphragm. The heart is  normal in size. IMPRESSION: Mild bibasilar atelectasis. Electronically Signed   By: Charline Bills M.D.   On: 04/17/2020 11:58   CT ABDOMEN PELVIS W CONTRAST  Result Date: 04/17/2020 CLINICAL DATA:  Abdominal pain, fever EXAM: CT ABDOMEN AND PELVIS WITH CONTRAST TECHNIQUE: Multidetector CT imaging of the abdomen and pelvis was performed using the standard protocol following bolus administration of intravenous contrast. CONTRAST:  OMNIPAQUE IOHEXOL 300 MG/ML  SOLN COMPARISON:  None. FINDINGS: Lower chest: Mild patchy bilateral lower lobe opacities, likely atelectasis. Hepatobiliary: Liver is within normal limits. Gallbladder is unremarkable. No intrahepatic or extrahepatic ductal dilatation. Pancreas: Within normal limits. Spleen: Within normal limits. Adrenals/Urinary Tract: Adrenal glands are within normal limits. Excretory contrast in the bilateral renal collecting systems. 7 mm left upper pole renal cyst (series 2/image 25). No hydronephrosis. Bladder is within normal limits. Stomach/Bowel: Contrast within the distal esophagus, possibly reflecting esophageal dysmotility or reflux, nonspecific. Tiny hiatal hernia. No evidence of bowel obstruction. Normal appendix (series 2/image 57). Sigmoid diverticulosis, without evidence of diverticulitis. Vascular/Lymphatic: No evidence of abdominal aortic aneurysm. Atherosclerotic calcifications of the abdominal aorta and branch vessels. No suspicious abdominopelvic lymphadenopathy. Reproductive: The uterus is within normal limits. No adnexal masses. Other: No abdominopelvic ascites. Musculoskeletal: Degenerative changes of the lumbar spine. IMPRESSION: No evidence of bowel obstruction. Normal appendix. Sigmoid diverticulosis, without evidence of diverticulitis. No CT findings to account for the patient's abdominal pain. Electronically Signed   By: Charline Bills M.D.   On: 04/17/2020 11:57   MRI -brain 04/10/20 Acute inferior division right MCA territory  infarct. Acute right PCA territory infarct (which occurred even more recently based on the prior head CT). 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 2. Pre-existing right ACA and upper division MCA territory infarcts. 3. 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 There is occlusion of the right ICA beginning at the cavernous segment and this vessel remains occluded through the ICA terminus. There is non opacification of the right middle cerebral artery vessels. Additionally, there is non opacification of the A1 right anterior cerebral artery. The A2 and more distal right anterior cerebral artery is markedly diminutive with multifocal high-grade stenoses throughout. Assessment/Plan:  Rt CVA- infarct Rt ACA/RT MCA area with severe stenosis of RT ICA. Left hemiplegia  Fever- unclear source- blood culture 1 of 4 coag neg staph- likely contaminant TEE negative Has neck pain and rt torticollis MRI neck okay Very likely fever is central in origin. there is discordance of  HR to temp Doubt the rt maxillary  sinus to be the cause of the fever as she has no maxillary pain or tenderness- will DC unasyn and observe  Macrocytosis  Discussed the management with hospitalist

## 2020-04-17 NOTE — Progress Notes (Signed)
[facility] PROGRESS NOTE    Ashwika Freels  WUJ:[id number] DOB: [birth year] DOA: 04/09/2020 PCP: Patient, No Pcp Per      Brief Narrative:  Stephanie Mason is a 69 y.o. F with hx recurrent stroke and left sided hemiparesis who presented with new headache and worsening left sided weakness.  From H&P "Patient was in her usual state of health until [day before admission] when she developed onset of [severe] right periorbital headache.  ...  This was also associated with nausea and vomiting ...  Her boyfriend thought she was having a stroke because she had slurred speech and she admits that she had blurry vision in her right eye.  Patient was sent to ED by [facility] nurse to rule out stroke."  In the ER, CT head showed new acute right sided strokes.  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         Assessment & Plan:  Acute RIGHT MCA/PCA stroke Patient admitted for new stroke.  MRI brain confirmed stroke.  Outside window for tPA.  Angiography showed normal left ICA but occluded intracranial right ICA (of note, there was no disease here in 2014 at time of her last stroke, at which time she had MRA head and carotid US).  -Non-invasive angiography showed occluded RIGHT ICA -Transesophageal echocardiogram showed no cardiogenic source of embolism -Statin contraindicated due to prior intolerance -Aspirin ordered at admission --> continue low-dose aspirin -Atrial fibrillation: not present, Zio patch pending, will need ILR after that  -tPA not given because outside the window -Dysphagia screen ordered in ER -PT eval ordered: recommended SNF -Smoking cessation: not pertinent     Fever Etiology unclear.  It was presumed infection and so Unasyn was started, but she is now completed 5 days of Unasyn and continues to fever.    Source of infection not obvious.  She has a lot of leg leg pain, but examination of  the leg is normal, left foot x-ray normal and exam benign to me.  No meningismus, doubt meningitis.  No URI symptoms to suggest respiratory virus.  Sinusitis on CT but no sinus symptoms or signs on exam, doubt sinusitis, didn't respond to Unasyn.  TEE and Bcx rules out endocarditis.  Urine culture negative.  MRI neck unremarkable.  Fever again last 24 hours.    -Stop antibiotics -Monitor fever curve -Repeat blood cultures -Obtain CT abdomen/pelvis and CXR       Hypokalemia -Repeat K    Blood culture positive 1/4 cultures with CoNS, likely contaminant.    Torticollis Severe left sided spasticity Her left sided spasticity and torticollis seems severe.   Discussed with Neurology, these two symptoms appear to be related.  -Continue new Flexeril -Will need to be referred for Botox after discharge for torticollis and spasticity      Disposition: Status is: Inpatient  Remains inpatient appropriate because:Inpatient level of care appropriate due to severity of illness   Dispo: The patient is from: Home              Anticipated d/c is to: SNF              Anticipated d/c date is: 1 day              Patient currently is not medically stable to d/c.   The patient was admitted with new large strokes.  We suspect these are embolic in origin, although no obvious source can  be found, no confirmation of arrhythmia has occurred yet.  She was previously able to participate in self-cares, and stand for transfers, but is present unable to stand, and is maximal assist for any movement, will need significant rehabilitation to improve to her prior level of function.  At present, she continues to fever, with no obvious source of infection found.  Will need repeat imaging, repeat cultures.         MDM: The below labs and imaging reports were reviewed and summarized above.  Medication management as above.    DVT prophylaxis: enoxaparin (LOVENOX) injection 40 mg Start: 04/10/20  1100  Code Status: FULL Family Communication: Husband at the bedside    Consultants:   Neurology  Cardiology  Infectious disease  Procedures:   8/29 CT head -- infarct, sinusitis  8/30 MR brain -- infarct, cytotoxic edema  9/3 TEE -- no embolic source  9/4 MR neck -- unremarkable  9/6 CT abdomen and pelvis -- no acute disease  Antimicrobials:   Unasyn 9/2 >> 9/6  Culture data:   9/1 blood culture -- CoNS in 1/4  9/2 blood culture -- no growth   9/6 blood culture x2 -- penidng          Subjective: Fever again last night. Abdominal pain noted this morning.  Constipated.  No confusion or headache, no vomiting, diarrhea, respiratory distress.    She has some tenderness in her left knee, with exam of her left knee is normal.  Objective: Vitals:   04/16/20 1832 04/16/20 2107 04/17/20 0758 04/17/20 1141  BP: (!) 163/70 139/72 (!) 159/83 (!) 150/74  Pulse: 93 93 80 89  Resp: (!) 22  16 16   Temp: 99.2 F (37.3 C) (!) 102.4 F (39.1 C) 98.1 F (36.7 C) 99.1 F (37.3 C)  TempSrc: Oral Oral Oral Oral  SpO2: 97% 100% 97% 96%  Weight:      Height:        Intake/Output Summary (Last 24 hours) at 04/17/2020 1355 Last data filed at 04/17/2020 1027 Gross per 24 hour  Intake 120 ml  Output --  Net 120 ml   Filed Weights   04/14/20 0505 04/14/20 1255 04/15/20 0508  Weight: 75.7 kg 75.7 kg 79.6 kg    Examination: General appearance: Thin elderly female, lying in bed, watching television, no obvious distress, interactive     HEENT:    Skin: Skin warm and dry, no suspicious rashes of bilateral feet, left leg, knee. Cardiac: RRR, no murmurs, no lower extremity edema Respiratory: Normal respiratory rate and rhythm, lungs clear without rales or wheezes, respirations shallow. Abdomen: No tenderness palpation or guarding.  No ascites or distention. MSK: Left-sided contractures, torticollis to the right still, no meningismus. Neuro: Awake and alert, extraocular  movements intact see mostly intact, right-sided facial droop, severe left-sided hemiparesis, speech fluent Psych: Sensorium intact responding to questions, attention seems diminished, affect blunted, judgment insight appear normal     Data Reviewed: I have personally reviewed following labs and imaging studies:  CBC: Recent Labs  Lab 04/13/20 0443 04/14/20 0422 04/15/20 0514 04/16/20 0344  WBC 7.7 7.6 7.6 7.3  HGB 14.1 13.6 13.0 12.1  HCT 40.9 39.4 39.0 36.1  MCV 103.0* 102.3* 103.7* 103.4*  PLT 181 201 216 242   Basic Metabolic Panel: Recent Labs  Lab 04/13/20 0443 04/14/20 0422 04/15/20 0514 04/17/20 0521  NA 131* 133* 133*  --   K 3.5 3.3* 3.4*  --   CL 95* 98 98  --  CO2 25 26 27   --   GLUCOSE 158* 148* 138*  --   BUN 9 8 11   --   CREATININE 0.81 0.63 0.80 0.59  CALCIUM 8.3* 8.3* 8.3*  --    GFR: Estimated Creatinine Clearance: 75.1 mL/min (by C-G formula based on SCr of 0.59 mg/dL). Liver Function Tests: No results for input(s): AST, ALT, ALKPHOS, BILITOT, PROT, ALBUMIN in the last 168 hours. No results for input(s): LIPASE, AMYLASE in the last 168 hours. No results for input(s): AMMONIA in the last 168 hours. Coagulation Profile: No results for input(s): INR, PROTIME in the last 168 hours. Cardiac Enzymes: No results for input(s): CKTOTAL, CKMB, CKMBINDEX, TROPONINI in the last 168 hours. BNP (last 3 results) No results for input(s): PROBNP in the last 8760 hours. HbA1C: No results for input(s): HGBA1C in the last 72 hours. CBG: Recent Labs  Lab 04/15/20 0820 04/15/20 2138  GLUCAP 93 148*   Lipid Profile: No results for input(s): CHOL, HDL, LDLCALC, TRIG, CHOLHDL, LDLDIRECT in the last 72 hours. Thyroid Function Tests: No results for input(s): TSH, T4TOTAL, FREET4, T3FREE, THYROIDAB in the last 72 hours. Anemia Panel: No results for input(s): VITAMINB12, FOLATE, FERRITIN, TIBC, IRON, RETICCTPCT in the last 72 hours. Urine analysis: No results  found for: COLORURINE, APPEARANCEUR, LABSPEC, PHURINE, GLUCOSEU, HGBUR, BILIRUBINUR, KETONESUR, PROTEINUR, UROBILINOGEN, NITRITE, LEUKOCYTESUR Sepsis Labs: @LABRCNTIP (procalcitonin:4,lacticacidven:4)  ) Recent Results (from the past 240 hour(s))  SARS Coronavirus 2 by RT PCR (hospital order, performed in [facility] hospital lab) Nasopharyngeal Nasopharyngeal Swab     Status: None   Collection Time: 04/10/20 10:58 AM   Specimen: Nasopharyngeal Swab  Result Value Ref Range Status   SARS Coronavirus 2 NEGATIVE NEGATIVE Final    Comment: (NOTE) SARS-CoV-2 target nucleic acids are NOT DETECTED.  The SARS-CoV-2 RNA is generally detectable in upper and lower respiratory specimens during the acute phase of infection. The lowest concentration of SARS-CoV-2 viral copies this assay can detect is 250 copies / mL. A negative result does not preclude SARS-CoV-2 infection and should not be used as the sole basis for treatment or other patient management decisions.  A negative result may occur with improper specimen collection / handling, submission of specimen other than nasopharyngeal swab, presence of viral mutation(s) within the areas targeted by this assay, and inadequate number of viral copies (<250 copies / mL). A negative result must be combined with clinical observations, patient history, and epidemiological information.  Fact Sheet for Patients:    Fact Sheet for Healthcare Providers: [facility]  This test is not yet approved or  cleared by the 04/12/20 FDA and has been authorized for detection and/or diagnosis of SARS-CoV-2 by FDA under an Emergency Use Authorization (EUA).  This EUA will remain in effect (meaning this test can be used) for the duration of the COVID-19 declaration under Section 564(b)(1) of the Act, 21 U.S.C. section 360bbb-3(b)(1), unless the authorization is terminated or revoked  sooner.  Performed at [facility], [address] [place], [facility] [address]   Culture, blood (Routine X 2) w Reflex to ID Panel     Status: Abnormal   Collection Time: 04/12/20 10:43 AM   Specimen: BLOOD  Result Value Ref Range Status   Specimen Description   Final    BLOOD BACK OF LEFT HAND Performed at [facility], [address] [place], [facility] [address]    Special Requests   Final    BOTTLES DRAWN AEROBIC AND ANAEROBIC Blood Culture adequate volume  Performed at [facility], [address] [place], [place] [address]    Culture  Setup Time   Final    Organism ID to follow GRAM POSITIVE COCCI ANAEROBIC BOTTLE ONLY CRITICAL RESULT CALLED TO, READ BACK BY AND VERIFIED WITH: Princella [facility] [id number] 04/13/20 HNM Performed at [facility], [address] [place], [place] [address]    Culture (A)  Final    STAPHYLOCOCCUS AURICULARIS THE SIGNIFICANCE OF ISOLATING THIS ORGANISM FROM A SINGLE SET OF BLOOD CULTURES WHEN MULTIPLE SETS ARE DRAWN IS UNCERTAIN. PLEASE NOTIFY THE MICROBIOLOGY DEPARTMENT WITHIN ONE WEEK IF SPECIATION AND SENSITIVITIES ARE REQUIRED. Performed at [facility], [address] [place], [place] [address]    Report Status 04/16/2020 FINAL  Final  Blood Culture ID Panel (Reflexed)     Status: Abnormal   Collection Time: 04/12/20 10:43 AM  Result Value Ref Range Status   Enterococcus faecalis NOT DETECTED NOT DETECTED Final   Enterococcus Faecium NOT DETECTED NOT DETECTED Final   Listeria monocytogenes NOT DETECTED NOT DETECTED Final   Staphylococcus species DETECTED (A) NOT DETECTED Final    Comment: CRITICAL RESULT CALLED TO, READ BACK BY AND VERIFIED WITH: JASON ROBBINS PHARMD [id number] 04/13/20 HNM    Staphylococcus aureus (BCID) NOT DETECTED NOT DETECTED Final   Staphylococcus epidermidis NOT DETECTED NOT DETECTED Final   Staphylococcus lugdunensis NOT DETECTED NOT DETECTED Final   Streptococcus species NOT  DETECTED NOT DETECTED Final   Streptococcus agalactiae NOT DETECTED NOT DETECTED Final   Streptococcus pneumoniae NOT DETECTED NOT DETECTED Final   Streptococcus pyogenes NOT DETECTED NOT DETECTED Final   A.calcoaceticus-baumannii NOT DETECTED NOT DETECTED Final   Bacteroides fragilis NOT DETECTED NOT DETECTED Final   Enterobacterales NOT DETECTED NOT DETECTED Final   Enterobacter cloacae complex NOT DETECTED NOT DETECTED Final   Escherichia coli NOT DETECTED NOT DETECTED Final   Klebsiella aerogenes NOT DETECTED NOT DETECTED Final   Klebsiella oxytoca NOT DETECTED NOT DETECTED Final   Klebsiella pneumoniae NOT DETECTED NOT DETECTED Final   Proteus species NOT DETECTED NOT DETECTED Final   Salmonella species NOT DETECTED NOT DETECTED Final   Serratia marcescens NOT DETECTED NOT DETECTED Final   Haemophilus influenzae NOT DETECTED NOT DETECTED Final   Neisseria meningitidis NOT DETECTED NOT DETECTED Final   Pseudomonas aeruginosa NOT DETECTED NOT DETECTED Final   Stenotrophomonas maltophilia NOT DETECTED NOT DETECTED Final   Candida albicans NOT DETECTED NOT DETECTED Final   Candida auris NOT DETECTED NOT DETECTED Final   Candida glabrata NOT DETECTED NOT DETECTED Final   Candida krusei NOT DETECTED NOT DETECTED Final   Candida parapsilosis NOT DETECTED NOT DETECTED Final   Candida tropicalis NOT DETECTED NOT DETECTED Final   Cryptococcus neoformans/gattii NOT DETECTED NOT DETECTED Final    Comment: Performed at [facility], [address] [place], [place] [address]  Culture, blood (Routine X 2) w Reflex to ID Panel     Status: None   Collection Time: 04/12/20 12:47 PM   Specimen: BLOOD  Result Value Ref Range Status   Specimen Description BLOOD BLOOD LEFT WRIST  Final   Special Requests   Final    BOTTLES DRAWN AEROBIC AND ANAEROBIC Blood Culture adequate volume   Culture   Final    NO GROWTH 5 DAYS Performed at [facility], [address] [place],  [place] [address]    Report Status 04/17/2020 FINAL  Final  Culture, blood (Routine X 2) w Reflex to ID Panel  Status: None (Preliminary result)   Collection Time: 04/13/20  2:34 PM   Specimen: BLOOD  Result Value Ref Range Status   Specimen Description BLOOD BLOOD LEFT HAND  Final   Special Requests   Final    BOTTLES DRAWN AEROBIC AND ANAEROBIC Blood Culture adequate volume   Culture   Final    NO GROWTH 4 DAYS Performed at [facility], [address] [place], [place] [address]    Report Status PENDING  Incomplete  Culture, blood (Routine X 2) w Reflex to ID Panel     Status: None (Preliminary result)   Collection Time: 04/13/20  4:18 PM   Specimen: BLOOD  Result Value Ref Range Status   Specimen Description BLOOD BLOOD LEFT HAND  Final   Special Requests   Final    BOTTLES DRAWN AEROBIC ONLY Blood Culture adequate volume   Culture   Final    NO GROWTH 4 DAYS Performed at [facility], [address], [place], [place] [address]    Report Status PENDING  Incomplete  Urine Culture     Status: None   Collection Time: 04/15/20  6:00 AM   Specimen: Urine, Random  Result Value Ref Range Status   Specimen Description   Final    URINE, RANDOM Performed at [facility], [address], [place], [place] [address]    Special Requests   Final    NONE Performed at [facility], [address] [place], [place] [address]    Culture   Final    NO GROWTH Performed at [facility], [address], [place], [place] [address]    Report Status 04/17/2020 FINAL  Final         Radiology Studies: DG Chest 2 View  Result Date: 04/17/2020 CLINICAL DATA:  Fever EXAM: CHEST - 2 VIEW COMPARISON:  04/13/2020 FINDINGS: Mild patchy bibasilar opacities, suspicious for atelectasis. No frank interstitial edema. No pleural effusion or pneumothorax. Mild eventration of the right hemidiaphragm. The heart is normal in size. IMPRESSION: Mild bibasilar  atelectasis. Electronically Signed   By: Charline Bills M.D.   On: 04/17/2020 11:58   CT ABDOMEN PELVIS W CONTRAST  Result Date: 04/17/2020 CLINICAL DATA:  Abdominal pain, fever EXAM: CT ABDOMEN AND PELVIS WITH CONTRAST TECHNIQUE: Multidetector CT imaging of the abdomen and pelvis was performed using the standard protocol following bolus administration of intravenous contrast. CONTRAST:  OMNIPAQUE IOHEXOL 300 MG/ML  SOLN COMPARISON:  None. FINDINGS: Lower chest: Mild patchy bilateral lower lobe opacities, likely atelectasis. Hepatobiliary: Liver is within normal limits. Gallbladder is unremarkable. No intrahepatic or extrahepatic ductal dilatation. Pancreas: Within normal limits. Spleen: Within normal limits. Adrenals/Urinary Tract: Adrenal glands are within normal limits. Excretory contrast in the bilateral renal collecting systems. 7 mm left upper pole renal cyst (series 2/image 25). No hydronephrosis. Bladder is within normal limits. Stomach/Bowel: Contrast within the distal esophagus, possibly reflecting esophageal dysmotility or reflux, nonspecific. Tiny hiatal hernia. No evidence of bowel obstruction. Normal appendix (series 2/image 57). Sigmoid diverticulosis, without evidence of diverticulitis. Vascular/Lymphatic: No evidence of abdominal aortic aneurysm. Atherosclerotic calcifications of the abdominal aorta and branch vessels. No suspicious abdominopelvic lymphadenopathy. Reproductive: The uterus is within normal limits. No adnexal masses. Other: No abdominopelvic ascites. Musculoskeletal: Degenerative changes of the lumbar spine. IMPRESSION: No evidence of bowel obstruction. Normal appendix. Sigmoid diverticulosis, without evidence of diverticulitis. No CT findings to account for the patient's abdominal pain. Electronically Signed   By: Roselie Awkward.D.  On: 04/17/2020 11:57        Scheduled Meds:   stroke: mapping our early stages of recovery book   Does not apply Once     stroke: mapping our early stages of recovery book   Does not apply Once   cyclobenzaprine  5 mg Oral TID   enoxaparin (LOVENOX) injection  40 mg Subcutaneous Q24H   fluticasone  1 spray Each Nare Daily   gabapentin  200 mg Oral q AM   gabapentin  400 mg Oral QHS   polyethylene glycol  17 g Oral Daily   senna-docusate  1 tablet Oral BID   sodium chloride flush  10-40 mL Intracatheter Q12H   Continuous Infusions:  sodium chloride Stopped (04/17/20 0129)   ampicillin-sulbactam (UNASYN) IV 3 g (04/17/20 1317)     LOS: 7 days    Time spent: 25 minutes    Alberteen Sam, MD [facility] 04/17/2020, 1:55 PM     Please page though AMION or Epic secure chat:  For [employer], Higher education careers adviser

## 2020-04-17 NOTE — Progress Notes (Signed)
Told Dr. Maryfrances Bunnell about patient's temperature. Received order for rectal temp and got that and reported it back to him. Pt resting comfortably. Denies pain.

## 2020-04-17 NOTE — Care Management Important Message (Signed)
Important Message  Patient Details  Name: Stephanie Mason MRN: [id number] Date of Birth: 03-05-1951   Medicare Important Message Given:  Yes     Johnell Comings 04/17/2020, 11:25 AM

## 2020-04-18 ENCOUNTER — Inpatient Hospital Stay: Payer: Medicare (Managed Care)

## 2020-04-18 LAB — BASIC METABOLIC PANEL
Anion gap: 10 (ref 5–15)
BUN: 10 mg/dL (ref 8–23)
CO2: 24 mmol/L (ref 22–32)
Calcium: 8.5 mg/dL — ABNORMAL LOW (ref 8.9–10.3)
Chloride: 96 mmol/L — ABNORMAL LOW (ref 98–111)
Creatinine, Ser: 0.76 mg/dL (ref 0.44–1.00)
GFR calc Af Amer: >60 mL/min (ref >60)
GFR calc non Af Amer: >60 mL/min (ref >60)
Glucose, Bld: 149 mg/dL — ABNORMAL HIGH (ref 70–99)
Potassium: 3.9 mmol/L (ref 3.5–5.1)
Sodium: 130 mmol/L — ABNORMAL LOW (ref 135–145)

## 2020-04-18 LAB — CBC
HCT: 37 % (ref 36.0–46.0)
Hemoglobin: 12.6 g/dL (ref 12.0–15.0)
MCH: 34.9 pg — ABNORMAL HIGH (ref 26.0–34.0)
MCHC: 34.1 g/dL (ref 30.0–36.0)
MCV: 102.5 fL — ABNORMAL HIGH (ref 80.0–100.0)
Platelets: 275 10*3/uL (ref 150–400)
RBC: 3.61 MIL/uL — ABNORMAL LOW (ref 3.87–5.11)
RDW: 12.5 % (ref 11.5–15.5)
WBC: 7 10*3/uL (ref 4.0–10.5)
nRBC: 0 % (ref 0.0–0.2)

## 2020-04-18 LAB — CULTURE, BLOOD (ROUTINE X 2)
Culture: NO GROWTH
Culture: NO GROWTH
Special Requests: ADEQUATE
Special Requests: ADEQUATE

## 2020-04-18 LAB — FACTOR 5 LEIDEN

## 2020-04-18 MED ORDER — BISACODYL 10 MG RE SUPP
10.0000 mg | Freq: Every day | RECTAL | Status: DC | PRN
  Administered 2020-04-18: 10 mg via RECTAL
  Filled 2020-04-18: qty 1

## 2020-04-18 MED ORDER — POLYETHYLENE GLYCOL 3350 17 G PO PACK
17.0000 g | PACK | Freq: Two times a day (BID) | ORAL | Status: DC
  Administered 2020-04-19 – 2020-04-20 (×3): 17 g via ORAL
  Filled 2020-04-18 (×4): qty 1

## 2020-04-18 MED ORDER — OXYCODONE HCL 5 MG PO TABS
5.0000 mg | ORAL_TABLET | Freq: Four times a day (QID) | ORAL | Status: DC | PRN
  Administered 2020-04-18: 5 mg via ORAL
  Filled 2020-04-18: qty 1

## 2020-04-18 MED ORDER — BISACODYL 10 MG RE SUPP
10.0000 mg | Freq: Once | RECTAL | Status: DC
  Filled 2020-04-18: qty 1

## 2020-04-18 MED ORDER — SENNOSIDES-DOCUSATE SODIUM 8.6-50 MG PO TABS
2.0000 | ORAL_TABLET | Freq: Two times a day (BID) | ORAL | Status: DC
  Administered 2020-04-18 – 2020-04-20 (×4): 2 via ORAL
  Filled 2020-04-18 (×4): qty 2

## 2020-04-18 MED ORDER — ACETAMINOPHEN 325 MG PO TABS
650.0000 mg | ORAL_TABLET | Freq: Three times a day (TID) | ORAL | Status: DC
  Administered 2020-04-18 – 2020-04-20 (×8): 650 mg via ORAL
  Filled 2020-04-18 (×7): qty 2

## 2020-04-18 NOTE — Progress Notes (Signed)
   04/17/20 1928 04/17/20 2028  Assess: MEWS Score  Temp (!) 102.4 F (39.1 C) 100.2 F (37.9 C)  BP 129/83 106/78  Pulse Rate 96 82  Resp 16 20  SpO2 100 % 100 %  O2 Device Room [employer]

## 2020-04-18 NOTE — Progress Notes (Signed)
Physical Therapy Treatment Patient Details Name: Stephanie Mason MRN: [id number] DOB: [birth year] Today's Date: 04/18/2020    History of Present Illness Stephanie Mason is a 69 y.o. 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 Patient was in her usual state of health until yesterday when she developed onset of right periorbital headache, slurred speech, and blurry vision in her right eye. 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    PT Comments    Pt was pleasant and put forth good effort during the session but remained quite limited functionally.  Pt required extensive +2 assist with all mobility and had difficulty maintaining static sitting balance at times.  With L lateral weight shifting activities in sitting the pt eventually was able to achieve neutral sitting position but would occasionally require assist and cues to prevent R lateral drift.  Pt again was unable to come to standing but was able to slightly unweight her bottom from the mattress during pre-transfer training.   Pt will benefit from PT services in a SNF setting upon discharge to safely address deficits listed in patient problem list for decreased caregiver assistance and eventual return to PLOF.     Follow Up Recommendations  SNF     Equipment Recommendations  None recommended by PT    Recommendations for Other Services       Precautions / Restrictions Precautions Precautions: Fall Required Braces or Orthoses: Other Brace Other Brace: L AFO Restrictions Weight Bearing Restrictions: No    Mobility  Bed Mobility Overal bed mobility: Needs Assistance Bed Mobility: Supine to Sit;Sit to Supine Rolling: +2 for physical assistance;Max assist   Supine to sit: +2 for physical assistance;Max assist Sit to supine: Max assist;+2 for physical  assistance   General bed mobility comments: +2 max A for BLE and trunk control  Transfers Overall transfer level: Needs assistance Equipment used: None             General transfer comment: Pt able to unweight bottom from mattress but unable to clear the surface; min to mod A at times to maintain static sitting balance at EOB  Ambulation/Gait             General Gait Details: Unable/unsafe to attempt   Stairs             Wheelchair Mobility    Modified Rankin (Stroke Patients Only)       Balance Overall balance assessment: Needs assistance Sitting-balance support: Feet unsupported;Single extremity supported Sitting balance-Leahy Scale: Poor Sitting balance - Comments: Occasional min to mod A to maintain static sitting balance       Standing balance comment: Unable                            Cognition Arousal/Alertness: Lethargic Behavior During Therapy: Flat affect Overall Cognitive Status: Within Functional Limits for tasks assessed                                        Exercises Total Joint Exercises Ankle Circles/Pumps: AROM;Strengthening;Right;10 reps Hip ABduction/ADduction: AAROM;Right;10 reps Straight Leg Raises: AAROM;Right;10 reps Other Exercises Other Exercises: Contract/relax and PROM for LLE hip and knee ext Other Exercises: Positioning education with nursing  to encourage L ankle DF ROM Other Exercises: L cervical max AROM rotation to the left x 15 with positioning education with nursing    General Comments        Pertinent Vitals/Pain Pain Assessment: No/denies pain    Home Living                      Prior Function            PT Goals (current goals can now be found in the care plan section) Progress towards PT goals: Progressing toward goals    Frequency    7X/week      PT Plan Current plan remains appropriate    Co-evaluation              AM-PAC PT "6 Clicks"  Mobility   Outcome Measure  Help needed turning from your back to your side while in a flat bed without using bedrails?: Total Help needed moving from lying on your back to sitting on the side of a flat bed without using bedrails?: Total Help needed moving to and from a bed to a chair (including a wheelchair)?: Total Help needed standing up from a chair using your arms (e.g., wheelchair or bedside chair)?: Total Help needed to walk in hospital room?: Total Help needed climbing 3-5 steps with a railing? : Total 6 Click Score: 6    End of Session Equipment Utilized During Treatment: Gait belt Activity Tolerance: Patient tolerated treatment well Patient left: in bed;with call bell/phone within reach;with bed alarm set Nurse Communication: Mobility status;Other (comment) (Positioning to encourage L ankle DF and L cervical rotation ROM) PT Visit Diagnosis: Unsteadiness on feet (R26.81);Other abnormalities of gait and mobility (R26.89);Muscle weakness (generalized) (M62.81);Other symptoms and signs involving the nervous system (R29.898);Hemiplegia and hemiparesis;Pain Hemiplegia - Right/Left: Left Hemiplegia - dominant/non-dominant: Non-dominant Hemiplegia - caused by: Cerebral infarction Pain - Right/Left: Left Pain - part of body: Knee     Time: 1006-1050 PT Time Calculation (min) (ACUTE ONLY): 44 min  Charges:  $Therapeutic Exercise: 23-37 mins $Therapeutic Activity: 8-22 mins                     D. Scott Shaquoya Cosper PT, DPT 04/18/20, 1:34 PM

## 2020-04-18 NOTE — Progress Notes (Signed)
[facility] PROGRESS NOTE    Stephanie MedicusClairstine Mason  ZOX:[id number] DOB: 1951/07/21 DOA: 04/09/2020 PCP: Patient, No Pcp Per      Brief Narrative:  [name] is a 69 y.o. F with hx recurrent stroke and left sided hemiparesis who presented with new headache and worsening left sided weakness.  From H&P "Patient was in her usual state of health until [day before admission] when she developed onset of [severe] right periorbital headache.  ...  This was also associated with nausea and vomiting ...  Her boyfriend thought she was having a stroke because she had slurred speech and she admits that she had blurry vision in her right eye.  Patient was sent to ED by [facility] nurse to rule out stroke."  In the ER, CT head showed new acute right sided strokes.  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         Assessment & Plan:  Acute RIGHT MCA/PCA stroke Patient admitted for new stroke.  MRI brain confirmed stroke.  Outside window for tPA.  Angiography showed normal left ICA but occluded intracranial right ICA (of note, there was no disease here in 2014 at time of her last stroke, at which time she had MRA head and carotid US).  -Non-invasive angiography showed occluded RIGHT ICA -Transesophageal echocardiogram showed no cardiogenic source of embolism -Statin contraindicated due to prior intolerance -Aspirin ordered at admission --> continue low-dose aspirin -Atrial fibrillation: not present, Zio patch pending, will need ILR after that  -tPA not given because outside the window -Dysphagia screen ordered in ER -PT eval ordered: recommended SNF -Smoking cessation: not pertinent   -Given that the patient is largely paralyzed now, we will obtain a low air loss bed      Fever Etiology unclear.  It was presumed infection and so Unasyn was started, but she is now completed 5 days of Unasyn and continues to fever.     Carefully reviewed for source of infection.  She has left leg pain, but examination of the leg is normal, left foot x-ray normal and exam benign.  No meningismus, doubt meningitis.  No URI symptoms to suggest respiratory virus.  CXR clear.  Sinusitis on CT but no sinus symptoms or signs on exam, doubt sinusitis, didn't respond to Unasyn.  TEE and Bcx rules out endocarditis.  Urine culture negative.  MRI neck unremarkable.  CT abdomen and pelvis unremarkable.  She continues to fever here.  Given the discordance between HR and fever, suspect the fever may be neurogenic in origin.  Constipation less likely the cause.  We have now stopped antibiotics without clinical change.     -Monitor fever curve off antibiotics -Follow blood cultures  -Obtain CT head to evaluate for further edema    Hypokalemia Resolved  Hyponatremia Mild, asymptomatic -Expectant management   Blood culture positive 1/4 cultures with CoNS, likely contaminant.    Torticollis Severe left sided spasticity Her left sided spasticity and torticollis have progressed substantially this admission.  The torticollis seems to be the result of her severe left-sided spasticity and hemiparesis, which are obviously from her stroke.  -Continue new Flexeril -Will need to be referred for Botox after discharge for torticollis and spasticity  Constipation -Augment bowel regimen    Disposition: Status is: Inpatient  Remains inpatient appropriate because:Inpatient level of care appropriate due to severity of illness   Dispo: The patient is from: Home  Anticipated d/c is to: SNF              Anticipated d/c date is: 1 day              Patient currently is not medically stable to d/c.   The patient was admitted with new large strokes.  We suspect these are embolic in origin, although no obvious source can be found, no confirmation of arrhythmia has occurred yet. Will need discharge with Zio patch, followed by ILR  if Zio negative.    She was previously able to participate in self-cares, and stand for transfers, but is present unable to stand, and is maximal assist for any movement, will need significant rehabilitation to improve to her prior level of function.   At present she continues to fever, despite extensive work-up for infection which is found nothing. We are beginning to suspect this is a neurogenic fever, which would have to resolve by itself. We will obtain CT imaging of the head to reevaluate for edema, and will monitor her fever curve off antibiotics.  If her fever curve improves off antibiotics, and no infection manifest itself, will advocate for transition to rehab in a few days, with outpatient follow-up with her [facility] provider.  Of note, her prgnosis is worsening, given her poor functional status, and we recognize that she may continue to deteriorate.  This was discussed with the patient's phyisican from [facility].         MDM: The below labs and imaging reports were reviewed and summarized above.  Medication management as above.    DVT prophylaxis: enoxaparin (LOVENOX) injection 40 mg Start: 04/10/20 1100  Code Status: FULL Family Communication: Husband at the bedside    Consultants:   Neurology  Cardiology  Infectious disease  Procedures:   8/29 CT head -- infarct, sinusitis  8/30 MR brain -- infarct, cytotoxic edema  9/3 TEE -- no embolic source  9/4 MR neck -- unremarkable  9/6 CT abdomen and pelvis -- no acute disease  Antimicrobials:   Unasyn 9/2 >> 9/6  Culture data:   9/1 blood culture -- CoNS in 1/4  9/2 blood culture -- no growth   9/6 blood culture x2 -- penidng          Subjective: Fever noted. No abdominal pain, confusion, diarrhea. Still constipated.        Objective: Vitals:   04/18/20 0013 04/18/20 0429 04/18/20 0726 04/18/20 1129  BP: (!) 144/71 129/66 (!) 162/71 (!) 144/79  Pulse: 86 67 82 81  Resp: 16 16 16 16    Temp: (!) 97.4 F (36.3 C) 97.7 F (36.5 C) (!) 97.5 F (36.4 C) 98.6 F (37 C)  TempSrc: Oral Oral Oral Oral  SpO2: 100% 100% 98% 100%  Weight:      Height:        Intake/Output Summary (Last 24 hours) at 04/18/2020 1522 Last data filed at 04/18/2020 1408 Gross per 24 hour  Intake 360 ml  Output 650 ml  Net -290 ml   Filed Weights   04/14/20 0505 04/14/20 1255 04/15/20 0508  Weight: 75.7 kg 75.7 kg 79.6 kg    Examination: General appearance: Thin elderly female, lying in bed, more sleepy today  HEENT:    Skin: Skin warm and dry, no suspicious rashes of bilateral feet, left leg, knee. Cardiac: RRR, no murmurs, no lower extremity edema Respiratory: Normal respiratory rate and rhythm, lungs clear without rales or wheezes, respirations shallow. Abdomen: No tenderness palpation or guarding.  No ascites or distention. MSK: Left-sided contractures, torticollis to the right still, no meningismus. Neuro: Awake and alert, extraocular movements intact see mostly intact, right-sided facial droop, severe left-sided hemiparesis, speech fluent Psych: Opens eyes briefly, makes one-word answers, attention diminished, affect flat, judgment insight appear impaired      Data Reviewed: I have personally reviewed following labs and imaging studies:  CBC: Recent Labs  Lab 04/14/20 0422 04/15/20 0514 04/16/20 0344 04/17/20 1413 04/18/20 0401  WBC 7.6 7.6 7.3 7.7 7.0  HGB 13.6 13.0 12.1 12.6 12.6  HCT 39.4 39.0 36.1 37.3 37.0  MCV 102.3* 103.7* 103.4* 102.8* 102.5*  PLT 201 216 242 308 275   Basic Metabolic Panel: Recent Labs  Lab 04/13/20 0443 04/13/20 0443 04/14/20 0422 04/15/20 0514 04/17/20 0521 04/17/20 1413 04/18/20 0401  NA 131*  --  133* 133*  --  129* 130*  K 3.5  --  3.3* 3.4*  --  4.2 3.9  CL 95*  --  98 98  --  95* 96*  CO2 25  --  26 27  --  26 24  GLUCOSE 158*  --  148* 138*  --  138* 149*  BUN 9  --  8 11  --  9 10  CREATININE 0.81   < > 0.63 0.80 0.59 0.63  0.76  CALCIUM 8.3*  --  8.3* 8.3*  --  8.6* 8.5*   < > = values in this interval not displayed.   GFR: Estimated Creatinine Clearance: 75.1 mL/min (by C-G formula based on SCr of 0.76 mg/dL). Liver Function Tests: No results for input(s): AST, ALT, ALKPHOS, BILITOT, PROT, ALBUMIN in the last 168 hours. No results for input(s): LIPASE, AMYLASE in the last 168 hours. No results for input(s): AMMONIA in the last 168 hours. Coagulation Profile: No results for input(s): INR, PROTIME in the last 168 hours. Cardiac Enzymes: No results for input(s): CKTOTAL, CKMB, CKMBINDEX, TROPONINI in the last 168 hours. BNP (last 3 results) No results for input(s): PROBNP in the last 8760 hours. HbA1C: No results for input(s): HGBA1C in the last 72 hours. CBG: Recent Labs  Lab 04/15/20 0820 04/15/20 2138 04/17/20 2010  GLUCAP 93 148* 96   Lipid Profile: No results for input(s): CHOL, HDL, LDLCALC, TRIG, CHOLHDL, LDLDIRECT in the last 72 hours. Thyroid Function Tests: No results for input(s): TSH, T4TOTAL, FREET4, T3FREE, THYROIDAB in the last 72 hours. Anemia Panel: No results for input(s): VITAMINB12, FOLATE, FERRITIN, TIBC, IRON, RETICCTPCT in the last 72 hours. Urine analysis: No results found for: COLORURINE, APPEARANCEUR, LABSPEC, PHURINE, GLUCOSEU, HGBUR, BILIRUBINUR, KETONESUR, PROTEINUR, UROBILINOGEN, NITRITE, LEUKOCYTESUR Sepsis Labs: (procalcitonin:4,lacticacidven:4)  ) Recent Results (from the past 240 hour(s))  SARS Coronavirus 2 by RT PCR (hospital order, performed in [facility] hospital lab) Nasopharyngeal Nasopharyngeal Swab     Status: None   Collection Time: 04/10/20 10:58 AM   Specimen: Nasopharyngeal Swab  Result Value Ref Range Status   SARS Coronavirus 2 NEGATIVE NEGATIVE Final    Comment: (NOTE) SARS-CoV-2 target nucleic acids are NOT DETECTED.  The SARS-CoV-2 RNA is generally detectable in upper and lower respiratory specimens during the acute phase of  infection. The lowest concentration of SARS-CoV-2 viral copies this assay can detect is 250 copies / mL. A negative result does not preclude SARS-CoV-2 infection and should not be used as the sole basis for treatment or other patient management decisions.  A negative result may occur with improper specimen collection / handling, submission of specimen other than nasopharyngeal  swab, presence of viral mutation(s) within the areas targeted by this assay, and inadequate number of viral copies (<250 copies / mL). A negative result must be combined with clinical observations, patient history, and epidemiological information.  Fact Sheet for Patients:   BoilerBrush.com.cy  Fact Sheet for Healthcare Providers: https://pope.com/  This test is not yet approved or  cleared by the [place] FDA and has been authorized for detection and/or diagnosis of SARS-CoV-2 by FDA under an Emergency Use Authorization (EUA).  This EUA will remain in effect (meaning this test can be used) for the duration of the COVID-19 declaration under Section 564(b)(1) of the Act, 21 U.S.C. section 360bbb-3(b)(1), unless the authorization is terminated or revoked sooner.  Performed at [facility], [address] [place], [place] [address]   Culture, blood (Routine X 2) w Reflex to ID Panel     Status: Abnormal   Collection Time: 04/12/20 10:43 AM   Specimen: BLOOD  Result Value Ref Range Status   Specimen Description   Final    BLOOD BACK OF LEFT HAND Performed at [facility], [address], [place], [place] [address]    Special Requests   Final    BOTTLES DRAWN AEROBIC AND ANAEROBIC Blood Culture adequate volume Performed at [facility], [address] [place], [place] [address]    Culture  Setup Time   Final    Organism ID to follow GRAM POSITIVE COCCI ANAEROBIC BOTTLE ONLY CRITICAL RESULT CALLED TO, READ BACK BY AND  VERIFIED WITH: Princella Ion [facility] 2153 04/13/20 HNM Performed at [facility], [address] [place], [place] [address]    Culture (A)  Final    STAPHYLOCOCCUS AURICULARIS THE SIGNIFICANCE OF ISOLATING THIS ORGANISM FROM A SINGLE SET OF BLOOD CULTURES WHEN MULTIPLE SETS ARE DRAWN IS UNCERTAIN. PLEASE NOTIFY THE MICROBIOLOGY DEPARTMENT WITHIN ONE WEEK IF SPECIATION AND SENSITIVITIES ARE REQUIRED. Performed at [facility], [address], [place], [place] [address]    Report Status 04/16/2020 FINAL  Final  Blood Culture ID Panel (Reflexed)     Status: Abnormal   Collection Time: 04/12/20 10:43 AM  Result Value Ref Range Status   Enterococcus faecalis NOT DETECTED NOT DETECTED Final   Enterococcus Faecium NOT DETECTED NOT DETECTED Final   Listeria monocytogenes NOT DETECTED NOT DETECTED Final   Staphylococcus species DETECTED (A) NOT DETECTED Final    Comment: CRITICAL RESULT CALLED TO, READ BACK BY AND VERIFIED WITH: JASON ROBBINS PHARMD 2153 04/13/20 HNM    Staphylococcus aureus (BCID) NOT DETECTED NOT DETECTED Final   Staphylococcus epidermidis NOT DETECTED NOT DETECTED Final   Staphylococcus lugdunensis NOT DETECTED NOT DETECTED Final   Streptococcus species NOT DETECTED NOT DETECTED Final   Streptococcus agalactiae NOT DETECTED NOT DETECTED Final   Streptococcus pneumoniae NOT DETECTED NOT DETECTED Final   Streptococcus pyogenes NOT DETECTED NOT DETECTED Final   A.calcoaceticus-baumannii NOT DETECTED NOT DETECTED Final   Bacteroides fragilis NOT DETECTED NOT DETECTED Final   Enterobacterales NOT DETECTED NOT DETECTED Final   Enterobacter cloacae complex NOT DETECTED NOT DETECTED Final   Escherichia coli NOT DETECTED NOT DETECTED Final   Klebsiella aerogenes NOT DETECTED NOT DETECTED Final   Klebsiella oxytoca NOT DETECTED NOT DETECTED Final   Klebsiella pneumoniae NOT DETECTED NOT DETECTED Final   Proteus species NOT DETECTED NOT DETECTED Final   Salmonella  species NOT DETECTED NOT DETECTED Final   Serratia marcescens NOT DETECTED NOT DETECTED Final   Haemophilus influenzae NOT DETECTED  NOT DETECTED Final   Neisseria meningitidis NOT DETECTED NOT DETECTED Final   Pseudomonas aeruginosa NOT DETECTED NOT DETECTED Final   Stenotrophomonas maltophilia NOT DETECTED NOT DETECTED Final   Candida albicans NOT DETECTED NOT DETECTED Final   Candida auris NOT DETECTED NOT DETECTED Final   Candida glabrata NOT DETECTED NOT DETECTED Final   Candida krusei NOT DETECTED NOT DETECTED Final   Candida parapsilosis NOT DETECTED NOT DETECTED Final   Candida tropicalis NOT DETECTED NOT DETECTED Final   Cryptococcus neoformans/gattii NOT DETECTED NOT DETECTED Final    Comment: Performed at [facility], [address] [place], [place] [address]  Culture, blood (Routine X 2) w Reflex to ID Panel     Status: None   Collection Time: 04/12/20 12:47 PM   Specimen: BLOOD  Result Value Ref Range Status   Specimen Description BLOOD BLOOD LEFT WRIST  Final   Special Requests   Final    BOTTLES DRAWN AEROBIC AND ANAEROBIC Blood Culture adequate volume   Culture   Final    NO GROWTH 5 DAYS Performed at [facility], [address], [place], [place] [address]    Report Status 04/17/2020 FINAL  Final  Culture, blood (Routine X 2) w Reflex to ID Panel     Status: None   Collection Time: 04/13/20  2:34 PM   Specimen: BLOOD  Result Value Ref Range Status   Specimen Description BLOOD BLOOD LEFT HAND  Final   Special Requests   Final    BOTTLES DRAWN AEROBIC AND ANAEROBIC Blood Culture adequate volume   Culture   Final    NO GROWTH 5 DAYS Performed at [facility], [address] [place], [place] [address]    Report Status 04/18/2020 FINAL  Final  Culture, blood (Routine X 2) w Reflex to ID Panel     Status: None   Collection Time: 04/13/20  4:18 PM   Specimen: BLOOD  Result Value Ref Range Status   Specimen Description BLOOD BLOOD  LEFT HAND  Final   Special Requests   Final    BOTTLES DRAWN AEROBIC ONLY Blood Culture adequate volume   Culture   Final    NO GROWTH 5 DAYS Performed at [facility], [address] [name], [place] [address]    Report Status 04/18/2020 FINAL  Final  Urine Culture     Status: None   Collection Time: 04/15/20  6:00 AM   Specimen: Urine, Random  Result Value Ref Range Status   Specimen Description   Final    URINE, RANDOM Performed at [facility], [address], [place], [place] [address]    Special Requests   Final    NONE Performed at [facility], [address], [place], [place] [address]    Culture   Final    NO GROWTH Performed at [facility], [address], [place], [place] [address]    Report Status 04/17/2020 FINAL  Final  CULTURE, BLOOD (ROUTINE X 2) w Reflex to ID Panel     Status: None (Preliminary result)   Collection Time: 04/17/20  2:13 PM   Specimen: BLOOD  Result Value Ref Range Status   Specimen Description BLOOD LEFT ANTECUBITAL  Final   Special Requests   Final    BOTTLES DRAWN AEROBIC AND ANAEROBIC Blood Culture adequate volume   Culture   Final    NO GROWTH < 24 HOURS Performed at [facility], [address]  [place], [place] [address]    Report Status PENDING  Incomplete  CULTURE, BLOOD (ROUTINE X 2) w Reflex to ID Panel     Status: None (Preliminary result)   Collection Time: 04/17/20  3:26 PM   Specimen: BLOOD  Result Value Ref Range Status   Specimen Description BLOOD LEFT ANTECUBITAL  Final   Special Requests   Final    BOTTLES DRAWN AEROBIC AND ANAEROBIC Blood Culture adequate volume   Culture   Final    NO GROWTH < 24 HOURS Performed at [facility] [facility], [address], [place], [place] [address]    Report Status PENDING  Incomplete         Radiology Studies: DG Chest 2 View  Result Date: 04/17/2020 CLINICAL DATA:  Fever EXAM: CHEST - 2 VIEW COMPARISON:  04/13/2020 FINDINGS:  Mild patchy bibasilar opacities, suspicious for atelectasis. No frank interstitial edema. No pleural effusion or pneumothorax. Mild eventration of the right hemidiaphragm. The heart is normal in size. IMPRESSION: Mild bibasilar atelectasis. Electronically Signed   By: Charline Bills M.D.   On: 04/17/2020 11:58   CT ABDOMEN PELVIS W CONTRAST  Result Date: 04/17/2020 CLINICAL DATA:  Abdominal pain, fever EXAM: CT ABDOMEN AND PELVIS WITH CONTRAST TECHNIQUE: Multidetector CT imaging of the abdomen and pelvis was performed using the standard protocol following bolus administration of intravenous contrast. CONTRAST:  OMNIPAQUE IOHEXOL 300 MG/ML  SOLN COMPARISON:  None. FINDINGS: Lower chest: Mild patchy bilateral lower lobe opacities, likely atelectasis. Hepatobiliary: Liver is within normal limits. Gallbladder is unremarkable. No intrahepatic or extrahepatic ductal dilatation. Pancreas: Within normal limits. Spleen: Within normal limits. Adrenals/Urinary Tract: Adrenal glands are within normal limits. Excretory contrast in the bilateral renal collecting systems. 7 mm left upper pole renal cyst (series 2/image 25). No hydronephrosis. Bladder is within normal limits. Stomach/Bowel: Contrast within the distal esophagus, possibly reflecting esophageal dysmotility or reflux, nonspecific. Tiny hiatal hernia. No evidence of bowel obstruction. Normal appendix (series 2/image 57). Sigmoid diverticulosis, without evidence of diverticulitis. Vascular/Lymphatic: No evidence of abdominal aortic aneurysm. Atherosclerotic calcifications of the abdominal aorta and branch vessels. No suspicious abdominopelvic lymphadenopathy. Reproductive: The uterus is within normal limits. No adnexal masses. Other: No abdominopelvic ascites. Musculoskeletal: Degenerative changes of the lumbar spine. IMPRESSION: No evidence of bowel obstruction. Normal appendix. Sigmoid diverticulosis, without evidence of diverticulitis. No CT findings to  account for the patient's abdominal pain. Electronically Signed   By: Charline Bills M.D.   On: 04/17/2020 11:57        Scheduled Meds: .  stroke: mapping our early stages of recovery book   Does not apply Once  .  stroke: mapping our early stages of recovery book   Does not apply Once  . acetaminophen  650 mg Oral TID  . cyclobenzaprine  5 mg Oral TID  . enoxaparin (LOVENOX) injection  40 mg Subcutaneous Q24H  . fluticasone  1 spray Each Nare Daily  . gabapentin  200 mg Oral q AM  . gabapentin  400 mg Oral QHS  . polyethylene glycol  17 g Oral Daily  . senna-docusate  1 tablet Oral BID  . sodium chloride flush  10-40 mL Intracatheter Q12H   Continuous Infusions: . sodium chloride 250 mL (04/18/20 0116)     LOS: 8 days    Time spent: 35 minutes    Alberteen Sam, MD [facility] 04/18/2020, 3:22 PM     Please page though AMION or Epic secure chat:  For [employer], contact charge  nurse

## 2020-04-18 NOTE — TOC Progression Note (Signed)
Transition of Care [facility]) - Progression Note    Patient Details  Name: Stephanie Mason MRN: [id number] Date of Birth: 03-Dec-1950  Transition of Care [facility]) CM/SW Contact  Barrie Dunker, RN Phone Number: 04/18/2020, 9:56 AM  Clinical Narrative:  Sherron Monday with Stephanie Mason at [facility] , Reviewed the bed offers with and she will talk to her team and let me know which facility, the patient has had the covid vaccines, [facility] will provide transportation to a facility if before 5 PM on Day of DC   Expected Discharge Plan: Home w Home Health Services Barriers to Discharge: Continued Medical Work up  Expected Discharge Plan and Services Expected Discharge Plan: Home w Home Health Services   Discharge Planning Services: CM Consult Post Acute Care Choice: Home Health Living arrangements for the past 2 months: Single Family Home                                       Social Determinants of Health (SDOH) Interventions    Readmission Risk Interventions No flowsheet data found.

## 2020-04-18 NOTE — TOC Progression Note (Signed)
Transition of Care [facility]) - Progression Note    Patient Details  Name: Stephanie Mason MRN: [id number] Date of Birth: [birth year]  Transition of [facility]) CM/SW Contact  Barrie Dunker, RN Phone Number: 04/18/2020, 10:17 AM  Clinical Narrative:   SW Miley called from [facility] and accepted the bed offer from [facility], I notified Debra at [facility] and accepted the Bed offer    Expected Discharge Plan: Home w Home Health Services Barriers to Discharge: Continued Medical Work up  Expected Discharge Plan and Services Expected Discharge Plan: Home w Home Health Services   Discharge Planning Services: CM Consult Post Acute Care Choice: Home Health Living arrangements for the past 2 months: Single Family Home                                       Social Determinants of Health (SDOH) Interventions    Readmission Risk Interventions No flowsheet data found.

## 2020-04-18 NOTE — Progress Notes (Signed)
OT Cancellation Note  Patient Details Name: Stephanie Mason MRN: [id number] DOB: Jul 26, 1951   Cancelled Treatment:    Reason Eval/Treat Not Completed: Medical issues which prohibited therapy. Pt's caregiver present in the room and pt reports fatigue from prior therapy session. Pt requests to be seen on another day so that she may better be able to participate. OT will follow up when able.   Jackquline Denmark, MS, OTR/L , CBIS ascom [phone]  04/18/20, 3:41 PM   04/18/2020, 3:41 PM

## 2020-04-19 DIAGNOSIS — J320 Chronic maxillary sinusitis: Secondary | ICD-10-CM

## 2020-04-19 LAB — CBC WITH DIFFERENTIAL/PLATELET
Abs Immature Granulocytes: 0.04 10*3/uL (ref 0.00–0.07)
Basophils Absolute: 0 10*3/uL (ref 0.0–0.1)
Basophils Relative: 0 %
Eosinophils Absolute: 0 10*3/uL (ref 0.0–0.5)
Eosinophils Relative: 0 %
HCT: 34.1 % — ABNORMAL LOW (ref 36.0–46.0)
Hemoglobin: 11.4 g/dL — ABNORMAL LOW (ref 12.0–15.0)
Immature Granulocytes: 1 %
Lymphocytes Relative: 10 %
Lymphs Abs: 0.7 10*3/uL (ref 0.7–4.0)
MCH: 34.8 pg — ABNORMAL HIGH (ref 26.0–34.0)
MCHC: 33.4 g/dL (ref 30.0–36.0)
MCV: 104 fL — ABNORMAL HIGH (ref 80.0–100.0)
Monocytes Absolute: 0.5 10*3/uL (ref 0.1–1.0)
Monocytes Relative: 7 %
Neutro Abs: 6.2 10*3/uL (ref 1.7–7.7)
Neutrophils Relative %: 82 %
Platelets: 348 10*3/uL (ref 150–400)
RBC: 3.28 MIL/uL — ABNORMAL LOW (ref 3.87–5.11)
RDW: 12.4 % (ref 11.5–15.5)
WBC: 7.5 10*3/uL (ref 4.0–10.5)
nRBC: 0 % (ref 0.0–0.2)

## 2020-04-19 LAB — COMPREHENSIVE METABOLIC PANEL
ALT: 31 U/L (ref 0–44)
AST: 33 U/L (ref 15–41)
Albumin: 2.8 g/dL — ABNORMAL LOW (ref 3.5–5.0)
Alkaline Phosphatase: 100 U/L (ref 38–126)
Anion gap: 11 (ref 5–15)
BUN: 10 mg/dL (ref 8–23)
CO2: 23 mmol/L (ref 22–32)
Calcium: 8.5 mg/dL — ABNORMAL LOW (ref 8.9–10.3)
Chloride: 96 mmol/L — ABNORMAL LOW (ref 98–111)
Creatinine, Ser: 0.59 mg/dL (ref 0.44–1.00)
GFR calc Af Amer: >60 mL/min (ref >60)
GFR calc non Af Amer: >60 mL/min (ref >60)
Glucose, Bld: 116 mg/dL — ABNORMAL HIGH (ref 70–99)
Potassium: 4.1 mmol/L (ref 3.5–5.1)
Sodium: 130 mmol/L — ABNORMAL LOW (ref 135–145)
Total Bilirubin: 1 mg/dL (ref 0.3–1.2)
Total Protein: 6.7 g/dL (ref 6.5–8.1)

## 2020-04-19 LAB — MAGNESIUM: Magnesium: 2.4 mg/dL (ref 1.7–2.4)

## 2020-04-19 LAB — PHOSPHORUS: Phosphorus: 2.5 mg/dL (ref 2.5–4.6)

## 2020-04-19 MED ORDER — ASPIRIN EC 81 MG PO TBEC
81.0000 mg | DELAYED_RELEASE_TABLET | Freq: Every day | ORAL | Status: DC
  Administered 2020-04-20: 81 mg via ORAL
  Filled 2020-04-19: qty 1

## 2020-04-19 NOTE — Progress Notes (Signed)
Occupational Therapy Treatment Patient Details Name: Stephanie Mason MRN: [id number] DOB: 1950-08-23 Today's Date: 04/19/2020    History of present illness Stephanie Mason is a 69 y.o. 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 Patient was in her usual state of health until yesterday when she developed onset of right periorbital headache, slurred speech, and blurry vision in her right eye. 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   OT comments  Upon entering the room, pt supine in bed with caregiver present in the room. Pt appearing very lethargic this session and caregiver verbalizing concerns over recent fevers and lethargy. OT placing warm cloth into pt's R hand and she was able to briefly wash face but needing hand over hand and mod cuing for initiation and attention to task. Pt with significant sensory deficit on L UE as well. OT holding pt's L hand and she reports therapist is touching shoulder. Pt needing max cuing to attend to L side and visually scan to find caregiver and verbalize his shirt color. Pt noted to be closing R eye during session and endorses diplopia. OT performing PROM to L UE in all planes of movement. Pt repositioned at end of session. Functional transfer and EOB not attempted secondary to lethargy.  Follow Up Recommendations  SNF    Equipment Recommendations  None recommended by OT       Precautions / Restrictions Precautions Precautions: Fall Required Braces or Orthoses: Other Brace Other Brace: L AFO       Mobility  Transfers   General transfer comment: deferred secondary to lethargy        ADL either performed or assessed with clinical judgement   ADL Overall ADL's : Needs assistance/impaired     Grooming: Moderate assistance;Wash/dry face;Wash/dry hands       General ADL Comments: Pt very lethargic this session. She was able to wash face partially with R hand and then needing hand over hand to continue to attend and initiate task to completion. Pt needing max cuing to locate L hand.               Cognition Arousal/Alertness: Lethargic Behavior During Therapy: Flat affect Overall Cognitive Status: Within Functional Limits for tasks assessed       General Comments: max multimodal cuing needed to sequence and initiate tasks                   Pertinent Vitals/ Pain       Pain Assessment: Faces Faces Pain Scale: Hurts a little bit Pain Location: generalized         Frequency  Min 1X/week        Progress Toward Goals  OT Goals(current goals can now be found in the care plan section)  Progress towards OT goals: Not progressing toward goals - comment (secondary to lethargy this session)  Acute Rehab OT Goals Patient Stated Goal: none stated Time For Goal Achievement: 04/24/20 Potential to Achieve Goals: Good  Plan Frequency needs to be updated       AM-PAC OT "6 Clicks" Daily Activity     Outcome Measure   Help from another person eating meals?: A Lot Help from another person taking care of personal grooming?: A Lot Help from another person toileting, which includes using toliet, bedpan, or urinal?: Total Help from another person  bathing (including washing, rinsing, drying)?: A Lot Help from another person to put on and taking off regular upper body clothing?: A Lot Help from another person to put on and taking off regular lower body clothing?: Total 6 Click Score: 10    End of Session    OT Visit Diagnosis: Other abnormalities of gait and mobility (R26.89);Muscle weakness (generalized) (M62.81);Hemiplegia and hemiparesis Hemiplegia - Right/Left: Left Hemiplegia - dominant/non-dominant: Non-Dominant Hemiplegia - caused by: Cerebral infarction   Activity Tolerance Patient limited by lethargy   Patient Left  with call bell/phone within reach;in bed;with bed alarm set;with family/visitor present   Nurse Communication Other (comment) (pt's lethargy and caregivers concerns)        Time: 1610-9604 OT Time Calculation (min): 24 min  Charges: OT General Charges $OT Visit: 1 Visit OT Treatments $Self Care/Home Management : 8-22 mins $Neuromuscular Re-education: 8-22 mins  Jackquline Denmark, MS, OTR/L , CBIS ascom [phone]  04/19/20, 3:42 PM

## 2020-04-19 NOTE — Progress Notes (Signed)
Physical Therapy Treatment Patient Details Name: Chanika Byland MRN: [id number] DOB: Jan 06, 1951 Today's Date: 04/19/2020    History of Present Illness Timesha Rother is a 69 y.o. 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 Patient was in her usual state of health until yesterday when she developed onset of right periorbital headache, slurred speech, and blurry vision in her right eye. 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    PT Comments    Pt was pleasant but somewhat lethargic during the session. Due to previous fever and lethargy all treatment was performed in supine. Pt demonstrated improved ability to activate LLE. But pt also displays contractures forming in L hip flexors and L ankle plantar flexors. Pt requires significant cueing to initiate turning head towards L side. Nurses and spouse educated on positioning and stretching to improve posture. Pt will benefit from PT services in a SNF setting upon discharge to safely address deficits listed in patient problem list for decreased caregiver assistance and eventual return to PLOF.    Follow Up Recommendations  SNF     Equipment Recommendations  None recommended by PT    Recommendations for Other Services       Precautions / Restrictions Precautions Precautions: Fall Required Braces or Orthoses: Other Brace Other Brace: L AFO    Mobility  Bed Mobility Overal bed mobility: Needs Assistance Bed Mobility: Rolling Rolling: Max assist;+2 for physical assistance         General bed mobility comments: +2 MaxA for rolling L and R, easier for pt to roll towards L side  Transfers                 General transfer comment: deferred secondary to lethargy  Ambulation/Gait             General Gait Details: Unable/unsafe to  attempt   Stairs             Wheelchair Mobility    Modified Rankin (Stroke Patients Only)       Balance Overall balance assessment: Needs assistance     Sitting balance - Comments: deferred       Standing balance comment: deferred                            Cognition Arousal/Alertness: Lethargic Behavior During Therapy: Flat affect;WFL for tasks assessed/performed Overall Cognitive Status: Within Functional Limits for tasks assessed                                 General Comments: max multimodal cuing needed to sequence and initiate tasks      Exercises Total Joint Exercises Ankle Circles/Pumps: AROM;Right;15 reps (AAROM L 10) Quad Sets: AROM;Right;10 reps (AROM L 10) Other Exercises: Prolonged end range stretch L dorsiflexion 11 reps Other Exercises: Contract/relax and PROM for LLE hip IR Other Exercises: Positioning education with nursing to encourage L ankle DF ROM Other Exercises: L cervical max AROM rotation to the left x 5    General Comments General comments (skin integrity, edema, etc.): pt displays plantar flexion in L ankle and L hip flexion contracture. pt tends to IR L hip      Pertinent Vitals/Pain Pain Assessment: No/denies pain Faces Pain Scale: Hurts  a little bit Pain Location: Verbalizes pain when going into extension but does not verbalize how much. No pain at rest Pain Intervention(s): Monitored during session;Repositioned    Home Living                      Prior Function            PT Goals (current goals can now be found in the care plan section) Acute Rehab PT Goals Patient Stated Goal: none stated PT Goal Formulation: With family Time For Goal Achievement: 04/25/20 Potential to Achieve Goals: Good Additional Goals Additional Goal #1: Pt will perform bed mobility, to include rolling and supine/sidelying <> sit, with no more than CGA. Additional Goal #2: Pt will perform sit <> stand and  stand/sit pivot sit transfers with no more than min A and LRAD, donning L AFO. Progress towards PT goals: Progressing toward goals    Frequency    7X/week      PT Plan Current plan remains appropriate    Co-evaluation              AM-PAC PT "6 Clicks" Mobility   Outcome Measure  Help needed turning from your back to your side while in a flat bed without using bedrails?: Total Help needed moving from lying on your back to sitting on the side of a flat bed without using bedrails?: Total Help needed moving to and from a bed to a chair (including a wheelchair)?: Total Help needed standing up from a chair using your arms (e.g., wheelchair or bedside chair)?: Total Help needed to walk in hospital room?: Total Help needed climbing 3-5 steps with a railing? : Total 6 Click Score: 6    End of Session   Activity Tolerance: Patient tolerated treatment well;Patient limited by lethargy Patient left: in bed;with call bell/phone within reach;with nursing/sitter in room Nurse Communication: Mobility status;Other (comment) (nurse educated on posistion in supine) PT Visit Diagnosis: Unsteadiness on feet (R26.81);Other abnormalities of gait and mobility (R26.89);Muscle weakness (generalized) (M62.81);Other symptoms and signs involving the nervous system (R29.898);Hemiplegia and hemiparesis;Pain Hemiplegia - Right/Left: Left Hemiplegia - dominant/non-dominant: Non-dominant Hemiplegia - caused by: Cerebral infarction Pain - Right/Left: Left Pain - part of body: Knee     Time: 0272-5366 PT Time Calculation (min) (ACUTE ONLY): 39 min  Charges:                        Nicolette Bang, SPT 04/19/20. 5:35 PM

## 2020-04-19 NOTE — Progress Notes (Signed)
ID Pt doing better Eating ( being fed by her friend) Patient Vitals for the past 24 hrs:  BP Temp Temp src Pulse Resp SpO2  04/19/20 1123 132/75 99.2 F (37.3 C) Oral 94 15 100 %  04/19/20 0811 (!) 153/78 98.5 F (36.9 C) Oral 83 17 98 %  04/19/20 0444 122/64 99.3 F (37.4 C) Oral 70 17 97 %  04/18/20 2357 (!) 145/82 98.2 F (36.8 C) Oral 85 17 97 %  04/18/20 2010 (!) 141/79 98.1 F (36.7 C) Oral 82 16 99 %  04/18/20 1559 (!) 155/72 100.2 F (37.9 C) Oral 100 16 97 %     More alert Chest b/l air entry [id number] abd soft Left hemiplegia  Labs CBC Latest Ref Rng & Units 04/19/2020 04/18/2020 04/17/2020  WBC 4.0 - 10.5 K/uL 7.5 7.0 7.7  Hemoglobin 12.0 - 15.0 g/dL 11.4(L) 12.6 12.6  Hematocrit 36 - 46 % 34.1(L) 37.0 37.3  Platelets 150 - 400 K/uL 348 275 308    CMP Latest Ref Rng & Units 04/19/2020 04/18/2020 04/17/2020  Glucose 70 - 99 mg/dL 098(J) 191(Y) 782(N)  BUN 8 - 23 mg/dL 10 10 9   Creatinine 0.44 - 1.00 mg/dL 5.62 1.30  Sodium 135 - 145 mmol/L 130(L) 130(L) 129(L)  Potassium 3.5 - 5.1 mmol/L 4.1 3.9 4.2  Chloride 98 - 111 mmol/L 96(L) 96(L) 95(L)  CO2 22 - 32 mmol/L 23 24 26   Calcium 8.9 - 10.3 mg/dL 8.65) ) 7.8(I)  Total Protein 6.5 - 8.1 g/dL 6.7 - -  Total Bilirubin 0.3 - 1.2 mg/dL 1.0 - -  Alkaline Phos 38 - 126 U/L 100 - -  AST 15 - 41 U/L 33 - -  ALT 0 - 44 U/L 31 - -   TEE neg  Micro 1of 4 staph epi Impression/recommendation  Imaging MRI brain from 04/10/20. Acute inferior division right MCA territory infarct. Acute right PCA territory infarct (which occurred even more recently based on the prior head CT). 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 2. Pre-existing right ACA and upper division MCA territory infarcts. 3. 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 Left hemiplegia  Fever- much improved in 48 hrs off  antibiotics- ? Central fever. Unlikely she has sinusitis   blood culture 1 of 4 coag neg staph-  contaminant TEE negative MRI neck okay    Discussed the management with patient and  hospitalist ID will sign off - call if needed

## 2020-04-19 NOTE — Telephone Encounter (Signed)
Zio can be placed 9/9. Thanks!

## 2020-04-19 NOTE — Progress Notes (Signed)
PROGRESS NOTE    Stephanie Mason  ZOX:[id number] DOB: 10/04/1950 DOA: 04/09/2020 PCP: Patient, No Pcp Per   Brief Narrative: The patient is a 69 year old African-American female with a past medical history of recurrent stroke and left hemiparesis who presented with a new headache and worsening left-sided weakness.  She had associated nausea and vomiting with this and her boyfriend thought she was having a stroke but she started having slurred speech and admitted to having blurry vision right eye.  She is sent to the ED by the pace nurse to rule out stroke and in the ED head CT was done which showed acute right-sided strokes.  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  Neurology evaluated the Gramig stroke secondary to carotid disease versus embolic versus hypercoagulable state but her hypercoagulable work-up was negative.  Neurology recommended aspirin daily and held her statin due to transaminitis and intolerance.  They recommended Zio patch and a loop recorder to monitor for proximal A. fib in the outpatient setting.  During her hospitalization she had fevers of unclear etiology and it was felt that the cause of the fever was central in origin given that she has been off of antibiotics and her fevers are improving.  Of note she is getting close to being medically stable to be discharged and her skilled nursing facility will not take her back until her temperature is less than 100.  Her T-max in last 24 hours has been 100.2.  ID has signed off the case and do not feel that she has a sinusitis.  EP to place a Zio patch prior to discharge in the morning if her temperature is less than 100 for last 24 hours.  Assessment & Plan:   Active Problems:   Acute CVA (cerebrovascular accident) (HCC)   Stroke (cerebrum) (HCC)  Acute RIGHT MCA/PCA stroke in a patient with Chronic Right ACA and MCA Infarction -Patient admitted for new  stroke.  MRI brain confirmed stroke as it showed Acute inferior Division of Right MCA territory infarct and acute Right PCA territory infarct and a large posterior communicating artery present on the Right.  Outside window for tPA. -Angiography showed normal left ICA but occluded intracranial right ICA (of note, there was no disease here in 2014 at time of her last stroke, at which time she had MRA head and carotid US). -Non-invasive angiography showed occluded RIGHT ICA -Transesophageal echocardiogram showed no cardiogenic source of embolism -Hypercoaguable Workup done and Negative   -Statin contraindicated due to prior intolerance -Aspirin ordered at admission but not resumed initially but will now continue low-dose Aspirin 81 mg po Daily  -She will need Zio Patch at D/C and if no evidence of A Fib on that will need a Loop Recorder to Monitor for Paroxsymal A Fib; EP to place prior to D/C -Dysphagia screen ordered in ER -PT eval ordered: recommended SNF -Smoking cessation: not pertinent  -Given that the patient is largely paralyzed now, we will obtain a low air loss bed -Goal BP is Normotensive -C/w Telemetry Monitoring and Frequent Neurochecks  Fever -Etiology unclear. It was presumed infection and so Unasyn was started, but she is now completed 5 days of Unasyn and continued to spike fever.   -Carefully reviewed for source of infection.  She has left leg pain, but examination of the leg is normal, left foot x-ray normal and exam benign.  LE venous Duplex Negative for DVT -No meningismus, doubt meningitis.   -  No URI symptoms to suggest respiratory virus.  CXR clear.   -Sinusitis on CT but no sinus symptoms or signs on exam, doubt sinusitis, didn't respond to Unasyn. -TEE and Bcx rules out endocarditis (1/4 + but likely contaminant).   -Urine culture negative.  MRI neck unremarkable.  CT abdomen and pelvis unremarkable. -She continued to fever here.  Given the discordance between HR and  fever, suspect the fever may be neurogenic in origin.   -Constipation less likely the cause. -We have now stopped antibiotics without clinical change and will continue to Monitor fever curve off antibiotics -Repeated blood cultures and showed NGTD at 2 Days from Blood Cx 9/6 -Obtained Repeat CT head to evaluate for further edema and showed "No acute intracranial abnormality. 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 Minimal, 2 mm, right to left midline shift. No evidence of acute hemorrhage. Chronic right maxillary sinusitis" -SNF wants the patient's Temperature below 100 -Fever Curve is improving. TMax the last 24 hours was 100.2 and has been afebrile all day -Continue to Monitor Temperature Curve; Has no Leukocytosis (WBC is 7.5)  Hypokalemia -Resolved. K+ is now 4.1 -Continue to Monitor and Replete as Necessary -Repeat CMP in the AM   Hyponatremia -Mild, asymptomatic -Sodium has slightly improved and went from 129 -> 130 -Expectant management  Blood Culture Positive -1/4 cultures with CoNS, likely contaminant.   -ID was consulted given her Fevers   Torticollis Severe left sided spasticity -Her left sided spasticity and torticollis have progressed substantially this admission.   -The torticollis seems to be the result of her severe left-sided spasticity and hemiparesis, which are obviously from her stroke. -Continue new Cyclobenzaprine 5 mg po TID  -She will need to be referred for Botox after discharge for torticollis and spasticity  Constipation -Augmented bowel regimen and was given a Bisacodyl Suppository 10 mg x1 this AM; C/w Bisacodyl Suppository 10 mg RC Daily prn Severe Constipation, Miralax 17 g po BID, and with Senna-Docusate 2 tab po BID   Macrocytic Anemia -Patient's Hgb/Hct is now 11.4/34.1 and slightly down from yesterday when it was 12.6/37.0 -Check Anemia Panel in the AM -Continue to Monitor  for S/Sx of Bleeding; Currently no overt bleeding noted -Repeat CBC in the AM   DVT prophylaxis: Enoxaparin 40 mg sq q24h Code Status: FULL CODE Family Communication: Discussed with Friend/Family at bedside  Disposition Plan: Anticipating D/C to SNF if Afebrile (Temp has to be less than 100.0 per SNF)  Status is: Inpatient  Remains inpatient appropriate because:Inpatient level of care appropriate due to severity of illness   Dispo: The patient is from: Home              Anticipated d/c is to: SNF              Anticipated d/c date is: 1 day              Patient currently is not medically stable to d/c.  Consultants:   Neurology  Infectious Diseases    Procedures:  ECHOCardiogram (TTE) IMPRESSIONS    1. Left ventricular ejection fraction, by estimation, is 60 to 65%. The  left ventricle has normal function. The left ventricle has no regional  wall motion abnormalities. Left ventricular diastolic function could not  be evaluated.  2. Right ventricular systolic function is normal. The right ventricular  size is normal. There is mildly elevated pulmonary artery systolic  pressure.  3. The mitral valve is normal  in structure. No evidence of mitral valve  regurgitation. No evidence of mitral stenosis.  4. The aortic valve is normal in structure. Aortic valve regurgitation is  not visualized. No aortic stenosis is present.  5. The inferior vena cava is normal in size with greater than 50%  respiratory variability, suggesting right atrial pressure of 3 mmHg.  6. No apical or subcostal windows.   FINDINGS  Left Ventricle: Left ventricular ejection fraction, by estimation, is 60  to 65%. The left ventricle has normal function. The left ventricle has no  regional wall motion abnormalities. The left ventricular internal cavity  size was normal in size. There is  no left ventricular hypertrophy. Left ventricular diastolic function  could not be evaluated.   Right  Ventricle: The right ventricular size is normal. No increase in  right ventricular wall thickness. Right ventricular systolic function is  normal. There is mildly elevated pulmonary artery systolic pressure. The  tricuspid regurgitant velocity is 2.99  m/s, and with an assumed right atrial pressure of 3 mmHg, the estimated  right ventricular systolic pressure is 38.8 mmHg.   Left Atrium: Left atrial size was normal in size.   Right Atrium: Right atrial size was normal in size.   Pericardium: There is no evidence of pericardial effusion.   Mitral Valve: The mitral valve is normal in structure. Normal mobility of  the mitral valve leaflets. No evidence of mitral valve regurgitation. No  evidence of mitral valve stenosis.   Tricuspid Valve: The tricuspid valve is normal in structure. Tricuspid  valve regurgitation is trivial. No evidence of tricuspid stenosis.   Aortic Valve: The aortic valve is normal in structure. Aortic valve  regurgitation is not visualized. No aortic stenosis is present.   Pulmonic Valve: The pulmonic valve was normal in structure. Pulmonic valve  regurgitation is not visualized. No evidence of pulmonic stenosis.   Aorta: The aortic root is normal in size and structure.   Venous: The inferior vena cava was not well visualized. The inferior vena  cava is normal in size with greater than 50% respiratory variability,  suggesting right atrial pressure of 3 mmHg.   IAS/Shunts: The interatrial septum was not well visualized.     LEFT VENTRICLE  PLAX 2D  LVIDd:     3.40 cm  LVIDs:     1.83 cm  LV PW:     0.96 cm  LV IVS:    0.90 cm  LVOT diam:   2.10 cm  LVOT Area:   3.46 cm     LEFT ATRIUM     Index  LA diam:  2.90 cm 1.63 cm/m             PULMONIC VALVE  AORTA         PV Vmax:    0.76 m/s  Ao Root diam: 2.50 cm PV Peak grad:  2.3 mmHg            RVOT Peak grad: 2 mmHg     TRICUSPID  VALVE  TR Peak grad:  35.8 mmHg  TR Vmax:    299.00 cm/s    SHUNTS  Systemic Diam: 2.10 cm   ECHOCARDIOGRAM (TEE) IMPRESSIONS    1. Left ventricular ejection fraction, by estimation, is 60 to 65%. The  left ventricle has normal function. The left ventricle has no regional  wall motion abnormalities. There is mild left ventricular hypertrophy.  2. Right ventricular systolic function is normal. The right ventricular  size is normal.  3. Left atrial size was mildly dilated. No left atrial/left atrial  appendage thrombus was detected.  4. No valve vegetation noted  5. No LA or LA appendage thrombus   Conclusion(s)/Recommendation(s): Normal biventricular function without  evidence of hemodynamically significant valvular heart disease.   FINDINGS  Left Ventricle: Left ventricular ejection fraction, by estimation, is 60  to 65%. The left ventricle has normal function. The left ventricle has no  regional wall motion abnormalities. The left ventricular internal cavity  size was normal in size. There is  mild left ventricular hypertrophy.   Right Ventricle: The right ventricular size is normal. No increase in  right ventricular wall thickness. Right ventricular systolic function is  normal.   Left Atrium: Left atrial size was mildly dilated. No left atrial/left  atrial appendage thrombus was detected.   Right Atrium: Right atrial size was normal in size.   Pericardium: There is no evidence of pericardial effusion.   Mitral Valve: The mitral valve is normal in structure. Normal mobility of  the mitral valve leaflets. No evidence of mitral valve regurgitation. No  evidence of mitral valve stenosis.   Tricuspid Valve: The tricuspid valve is normal in structure. Tricuspid  valve regurgitation is not demonstrated. No evidence of tricuspid  stenosis.   Aortic Valve: The aortic valve is normal in structure. Aortic valve  regurgitation is not visualized. Mild to moderate  aortic valve  sclerosis/calcification is present, without any evidence of aortic  stenosis.   Pulmonic Valve: The pulmonic valve was normal in structure. Pulmonic valve  regurgitation is not visualized. No evidence of pulmonic stenosis.   Aorta: The aortic root is normal in size and structure.   Venous: The inferior vena cava is normal in size with greater than 50%  respiratory variability, suggesting right atrial pressure of 3 mmHg.   IAS/Shunts: No atrial level shunt detected by color flow Doppler. Agitated  saline contrast was given intravenously to evaluate for intracardiac  shunting.       AORTA  Ao Root diam: 2.80 cm   Antimicrobials:  Anti-infectives (From admission, onward)   Start     Dose/Rate Route Frequency Ordered Stop   04/13/20 1500  Ampicillin-Sulbactam (UNASYN) 3 g in sodium chloride 0.9 % 100 mL IVPB  Status:  Discontinued        3 g 200 mL/hr over 30 Minutes Intravenous Every 6 hours 04/13/20 1423 04/18/20 0733     Subjective: Seen and examined at bedside and she is still weak on the left side.  No nausea or vomiting.  She is getting assistance with feeding this morning.  No chest pain, lightheadedness or dizziness.  She had a temperature last night but none this morning.  Feels okay.  No other concerns at this time  Objective: Vitals:   04/19/20 0444 04/19/20 0811 04/19/20 1123 04/19/20 1536  BP: 122/64 (!) 153/78 132/75 120/82  Pulse: 70 83 94 87  Resp: 17 17 15 15   Temp: 99.3 F (37.4 C) 98.5 F (36.9 C) 99.2 F (37.3 C) 98.4 F (36.9 C)  TempSrc: Oral Oral Oral Oral  SpO2: 97% 98% 100% 100%  Weight:      Height:        Intake/Output Summary (Last 24 hours) at 04/19/2020 1538 Last data filed at 04/19/2020 0456 Gross per 24 hour  Intake 0 ml  Output 700 ml  Net -700 ml   Filed Weights   04/14/20 0505 04/14/20 1255 04/15/20 0508  Weight: 75.7 kg 75.7 kg 79.6  kg   Examination: Physical Exam:  Constitutional: WN/WD overweight  African-American female in NAD and appears calm and comfortable Eyes: Lids and conjunctivae normal, sclerae anicteric  ENMT: External Ears, Nose appear normal. Grossly normal hearing. Neck: Appears normal, supple, no cervical masses, normal ROM, no appreciable thyromegaly: Has some torticollis Respiratory: Diminished to auscultation bilaterally, no wheezing, rales, rhonchi or crackles. Normal respiratory effort and patient is not tachypenic. No accessory muscle use.  Unlabored breathing Cardiovascular: RRR, no murmurs / rubs / gallops. S1 and S2 auscultated. No extremity edema.  Abdomen: Soft, non-tender, slightly distended secondary body habitus.  Bowel sounds positive.  GU: Deferred. Musculoskeletal: No clubbing / cyanosis of digits/nails. No joint deformity upper and lower extremities. .  Skin: No rashes, lesions, ulcers on limited skin evaluation. No induration; Warm and dry.  Neurologic: CN 2-12 grossly intact with no focal deficits.  Has some hemiparesis and hemiplegia on the left side Psychiatric: Normal judgment and insight. Alert and oriented x 3. Normal mood and appropriate affect.   Data Reviewed: I have personally reviewed following labs and imaging studies  CBC: Recent Labs  Lab 04/15/20 0514 04/16/20 0344 04/17/20 1413 04/18/20 0401 04/19/20 0848  WBC 7.6 7.3 7.7 7.0 7.5  NEUTROABS  --   --   --   --  6.2  HGB 13.0 12.1 12.6 12.6 11.4*  HCT 39.0 36.1 37.3 37.0 34.1*  MCV 103.7* 103.4* 102.8* 102.5* 104.0*  PLT 216 242 308 275 348   Basic Metabolic Panel: Recent Labs  Lab 04/14/20 0422 04/14/20 0422 04/15/20 0514 04/17/20 0521 04/17/20 1413 04/18/20 0401 04/19/20 0848  NA 133*  --  133*  --  129* 130* 130*  K 3.3*  --  3.4*  --  4.2 3.9 4.1  CL 98  --  98  --  95* 96* 96*  CO2 26  --  27  --  26 24 23   GLUCOSE 148*  --  138*  --  138* 149* 116*  BUN 8  --  11  --  9 10 10   CREATININE 0.63   < > 0.80 0.59 0.63 0.76 0.59  CALCIUM 8.3*  --  8.3*  --  8.6* 8.5*  8.5*  MG  --   --   --   --   --   --  2.4  PHOS  --   --   --   --   --   --  2.5   < > = values in this interval not displayed.   GFR: Estimated Creatinine Clearance: 75.1 mL/min (by C-G formula based on SCr of 0.59 mg/dL). Liver Function Tests: Recent Labs  Lab 04/19/20 0848  AST 33  ALT 31  ALKPHOS 100  BILITOT 1.0  PROT 6.7  ALBUMIN 2.8*   No results for input(s): LIPASE, AMYLASE in the last 168 hours. No results for input(s): AMMONIA in the last 168 hours. Coagulation Profile: No results for input(s): INR, PROTIME in the last 168 hours. Cardiac Enzymes: No results for input(s): CKTOTAL, CKMB, CKMBINDEX, TROPONINI in the last 168 hours. BNP (last 3 results) No results for input(s): PROBNP in the last 8760 hours. HbA1C: No results for input(s): HGBA1C in the last 72 hours. CBG: Recent Labs  Lab 04/15/20 0820 04/15/20 2138 04/17/20 2010  GLUCAP 93 148* 96   Lipid Profile: No results for input(s): CHOL, HDL, LDLCALC, TRIG, CHOLHDL, LDLDIRECT in the last 72 hours. Thyroid Function Tests: No results for input(s): TSH, T4TOTAL, FREET4, T3FREE, THYROIDAB  in the last 72 hours. Anemia Panel: No results for input(s): VITAMINB12, FOLATE, FERRITIN, TIBC, IRON, RETICCTPCT in the last 72 hours. Sepsis Labs: Recent Labs  Lab 04/14/20 1544 04/15/20 0514 04/16/20 0344  PROCALCITON 0.99 0.66 0.49    Recent Results (from the past 240 hour(s))  SARS Coronavirus 2 by RT PCR (hospital order, performed in [facility] hospital lab) Nasopharyngeal Nasopharyngeal Swab     Status: None   Collection Time: 04/10/20 10:58 AM   Specimen: Nasopharyngeal Swab  Result Value Ref Range Status   SARS Coronavirus 2 NEGATIVE NEGATIVE Final    Comment: (NOTE) SARS-CoV-2 target nucleic acids are NOT DETECTED.  The SARS-CoV-2 RNA is generally detectable in upper and lower respiratory specimens during the acute phase of infection. The lowest concentration of SARS-CoV-2 viral copies this assay  can detect is 250 copies / mL. A negative result does not preclude SARS-CoV-2 infection and should not be used as the sole basis for treatment or other patient management decisions.  A negative result may occur with improper specimen collection / handling, submission of specimen other than nasopharyngeal swab, presence of viral mutation(s) within the areas targeted by this assay, and inadequate number of viral copies (<250 copies / mL). A negative result must be combined with clinical observations, patient history, and epidemiological information.  Fact Sheet for Patients:   BoilerBrush.com.cy  Fact Sheet for Healthcare Providers: https://pope.com/  This test is not yet approved or  cleared by the [place] FDA and has been authorized for detection and/or diagnosis of SARS-CoV-2 by FDA under an Emergency Use Authorization (EUA).  This EUA will remain in effect (meaning this test can be used) for the duration of the COVID-19 declaration under Section 564(b)(1) of the Act, 21 U.S.C. section 360bbb-3(b)(1), unless the authorization is terminated or revoked sooner.  Performed at [facility], [address] [place], [place] [address]   Culture, blood (Routine X 2) w Reflex to ID Panel     Status: Abnormal   Collection Time: 04/12/20 10:43 AM   Specimen: BLOOD  Result Value Ref Range Status   Specimen Description   Final    BLOOD BACK OF LEFT HAND Performed at [facility], [address] [place], [place] [address]    Special Requests   Final    BOTTLES DRAWN AEROBIC AND ANAEROBIC Blood Culture adequate volume Performed at [facility], [address] [place], [place] [address]    Culture  Setup Time   Final    Organism ID to follow GRAM POSITIVE COCCI ANAEROBIC BOTTLE ONLY CRITICAL RESULT CALLED TO, READ BACK BY AND VERIFIED WITH: Princella [facility] 2153 04/13/20 HNM Performed at [facility], [address] [place], [place] [address]    Culture (A)  Final    STAPHYLOCOCCUS AURICULARIS THE SIGNIFICANCE OF ISOLATING THIS ORGANISM FROM A SINGLE SET OF BLOOD CULTURES WHEN MULTIPLE SETS ARE DRAWN IS UNCERTAIN. PLEASE NOTIFY THE MICROBIOLOGY DEPARTMENT WITHIN ONE WEEK IF SPECIATION AND SENSITIVITIES ARE REQUIRED. Performed at [facility], [address], [place], [place] [address]    Report Status 04/16/2020 FINAL  Final  Blood Culture ID Panel (Reflexed)     Status: Abnormal   Collection Time: 04/12/20 10:43 AM  Result Value Ref Range Status   Enterococcus faecalis NOT DETECTED NOT DETECTED Final   Enterococcus Faecium NOT DETECTED NOT DETECTED Final   Listeria monocytogenes NOT DETECTED NOT DETECTED Final   Staphylococcus species DETECTED (A) NOT DETECTED Final  Comment: CRITICAL RESULT CALLED TO, READ BACK BY AND VERIFIED WITH: JASON ROBBINS PHARMD 2153 04/13/20 HNM    Staphylococcus aureus (BCID) NOT DETECTED NOT DETECTED Final   Staphylococcus epidermidis NOT DETECTED NOT DETECTED Final   Staphylococcus lugdunensis NOT DETECTED NOT DETECTED Final   Streptococcus species NOT DETECTED NOT DETECTED Final   Streptococcus agalactiae NOT DETECTED NOT DETECTED Final   Streptococcus pneumoniae NOT DETECTED NOT DETECTED Final   Streptococcus pyogenes NOT DETECTED NOT DETECTED Final   A.calcoaceticus-baumannii NOT DETECTED NOT DETECTED Final   Bacteroides fragilis NOT DETECTED NOT DETECTED Final   Enterobacterales NOT DETECTED NOT DETECTED Final   Enterobacter cloacae complex NOT DETECTED NOT DETECTED Final   Escherichia coli NOT DETECTED NOT DETECTED Final   Klebsiella aerogenes NOT DETECTED NOT DETECTED Final   Klebsiella oxytoca NOT DETECTED NOT DETECTED Final   Klebsiella pneumoniae NOT DETECTED NOT DETECTED Final   Proteus species NOT DETECTED NOT DETECTED Final   Salmonella species NOT DETECTED NOT DETECTED Final   Serratia marcescens NOT DETECTED NOT  DETECTED Final   Haemophilus influenzae NOT DETECTED NOT DETECTED Final   Neisseria meningitidis NOT DETECTED NOT DETECTED Final   Pseudomonas aeruginosa NOT DETECTED NOT DETECTED Final   Stenotrophomonas maltophilia NOT DETECTED NOT DETECTED Final   Candida albicans NOT DETECTED NOT DETECTED Final   Candida auris NOT DETECTED NOT DETECTED Final   Candida glabrata NOT DETECTED NOT DETECTED Final   Candida krusei NOT DETECTED NOT DETECTED Final   Candida parapsilosis NOT DETECTED NOT DETECTED Final   Candida tropicalis NOT DETECTED NOT DETECTED Final   Cryptococcus neoformans/gattii NOT DETECTED NOT DETECTED Final    Comment: Performed at [facility], [address] [place], [place] [address]  Culture, blood (Routine X 2) w Reflex to ID Panel     Status: None   Collection Time: 04/12/20 12:47 PM   Specimen: BLOOD  Result Value Ref Range Status   Specimen Description BLOOD BLOOD LEFT WRIST  Final   Special Requests   Final    BOTTLES DRAWN AEROBIC AND ANAEROBIC Blood Culture adequate volume   Culture   Final    NO GROWTH 5 DAYS Performed at [facility], [address] [place], [place] [address]    Report Status 04/17/2020 FINAL  Final  Culture, blood (Routine X 2) w Reflex to ID Panel     Status: None   Collection Time: 04/13/20  2:34 PM   Specimen: BLOOD  Result Value Ref Range Status   Specimen Description BLOOD BLOOD LEFT HAND  Final   Special Requests   Final    BOTTLES DRAWN AEROBIC AND ANAEROBIC Blood Culture adequate volume   Culture   Final    NO GROWTH 5 DAYS Performed at [facility], [address] [place], [place] [address]    Report Status 04/18/2020 FINAL  Final  Culture, blood (Routine X 2) w Reflex to ID Panel     Status: None   Collection Time: 04/13/20  4:18 PM   Specimen: BLOOD  Result Value Ref Range Status   Specimen Description BLOOD BLOOD LEFT HAND  Final   Special Requests   Final    BOTTLES DRAWN AEROBIC ONLY Blood  Culture adequate volume   Culture   Final    NO GROWTH 5 DAYS Performed at [facility], [address], [place], [place] [address]    Report Status 04/18/2020 FINAL  Final  Urine Culture     Status: None  Collection Time: 04/15/20  6:00 AM   Specimen: Urine, Random  Result Value Ref Range Status   Specimen Description   Final    URINE, RANDOM Performed at [facility], [address], [place], [place] [address]    Special Requests   Final    NONE Performed at [facility], [address] [place], [place] [address]    Culture   Final    NO GROWTH Performed at [facility], [address] [place], [place] [address]    Report Status 04/17/2020 FINAL  Final  CULTURE, BLOOD (ROUTINE X 2) w Reflex to ID Panel     Status: None (Preliminary result)   Collection Time: 04/17/20  2:13 PM   Specimen: BLOOD  Result Value Ref Range Status   Specimen Description BLOOD LEFT ANTECUBITAL  Final   Special Requests   Final    BOTTLES DRAWN AEROBIC AND ANAEROBIC Blood Culture adequate volume   Culture   Final    NO GROWTH 2 DAYS Performed at [facility], [address], [place], [place] [address]    Report Status PENDING  Incomplete  CULTURE, BLOOD (ROUTINE X 2) w Reflex to ID Panel     Status: None (Preliminary result)   Collection Time: 04/17/20  3:26 PM   Specimen: BLOOD  Result Value Ref Range Status   Specimen Description BLOOD LEFT ANTECUBITAL  Final   Special Requests   Final    BOTTLES DRAWN AEROBIC AND ANAEROBIC Blood Culture adequate volume   Culture   Final    NO GROWTH 2 DAYS Performed at [facility], [address] [place], [place] [address]    Report Status PENDING  Incomplete     RN Pressure Injury Documentation:     Estimated body mass index is 27.47 kg/m as calculated from the following:   Height as of this encounter: 5\' 7"  (1.702 m).   Weight as of this encounter: 79.6 kg.  Malnutrition Type:       Malnutrition Characteristics:      Nutrition Interventions:    Radiology Studies: CT HEAD WO CONTRAST  Result Date: 04/18/2020 CLINICAL DATA:  Mental status change, unknown cause EXAM: CT HEAD WITHOUT CONTRAST TECHNIQUE: Contiguous axial images were obtained from the base of the skull through the vertex without intravenous contrast. 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 There is no evidence of acute hemorrhage. There is minimal, 2 mm, right to left midline shift. No hydrocephalus. No evidence of new hemorrhage. The basilar cisterns are patent. Cerebellum is unremarkable. Vascular: No hyperdense vessel. Skull: No fracture or focal lesion. Sinuses/Orbits: Chronic mucosal thickening and opacification of the right maxillary sinus with cortical thickening. No new findings. Other: None. IMPRESSION: 1. No acute intracranial abnormality. 2. 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 Minimal, 2 mm, right to left midline shift. No evidence of acute hemorrhage. 3. Chronic right maxillary sinusitis. Electronically Signed   By: 04/13/2020 M.D.   On: 04/18/2020 17:20   06/18/2020 Venous Img Lower Bilateral (DVT)  Result Date: 04/18/2020 CLINICAL DATA:  Leg pain EXAM: BILATERAL LOWER EXTREMITY VENOUS DOPPLER ULTRASOUND TECHNIQUE: Gray-scale sonography with compression, as well as color and duplex ultrasound, were performed to evaluate the deep venous system(s) from the level of the common femoral vein through the popliteal and proximal calf veins. COMPARISON:  None. FINDINGS: VENOUS  Normal compressibility of the common femoral, superficial femoral, and popliteal veins, as well as the visualized calf veins. Visualized portions of profunda femoral vein and great saphenous vein unremarkable. No filling defects to suggest DVT on grayscale  or color Doppler imaging. Doppler waveforms show normal direction of venous flow, normal respiratory plasticity and response to augmentation. OTHER None. Limitations: none IMPRESSION: Negative. Electronically Signed   By: Katherine Mantle M.D.   On: 04/18/2020 20:11   Scheduled Meds: . acetaminophen  650 mg Oral TID  . bisacodyl  10 mg Rectal Once  . cyclobenzaprine  5 mg Oral TID  . enoxaparin (LOVENOX) injection  40 mg Subcutaneous Q24H  . fluticasone  1 spray Each Nare Daily  . gabapentin  200 mg Oral q AM  . gabapentin  400 mg Oral QHS  . polyethylene glycol  17 g Oral BID  . senna-docusate  2 tablet Oral BID  . sodium chloride flush  10-40 mL Intracatheter Q12H   Continuous Infusions: . sodium chloride 250 mL (04/18/20 0116)    LOS: 9 days   Merlene Laughter, DO [facility] PAGER is on AMION  If 7PM-7AM, please contact night-coverage [email or web address]

## 2020-04-19 NOTE — TOC Progression Note (Signed)
Transition of Care [facility]) - Progression Note    Patient Details  Name: Stephanie Mason MRN: [id number] Date of Birth: 13-Mar-1951  Transition of Care [facility]) CM/SW Contact  Barrie Dunker, RN Phone Number: 04/19/2020, 10:33 AM  Clinical Narrative:   Sherron Monday with Robin at [facility], she asked for an update, I explained that the patient will not be discharging today Due to temp 100.2 last night, needs to have a covid test before DC and Zella Ball wants a call letting her know results, Notified the physician that the test is needed    Expected Discharge Plan: Home w Home Health Services Barriers to Discharge: Continued Medical Work up  Expected Discharge Plan and Services Expected Discharge Plan: Home w Home Health Services   Discharge Planning Services: CM Consult Post Acute Care Choice: Home Health Living arrangements for the past 2 months: Single Family Home                                       Social Determinants of Health (SDOH) Interventions    Readmission Risk Interventions No flowsheet data found.

## 2020-04-20 ENCOUNTER — Ambulatory Visit (INDEPENDENT_AMBULATORY_CARE_PROVIDER_SITE_OTHER): Payer: Medicare (Managed Care)

## 2020-04-20 DIAGNOSIS — I639 Cerebral infarction, unspecified: Secondary | ICD-10-CM

## 2020-04-20 LAB — CBC WITH DIFFERENTIAL/PLATELET
Abs Immature Granulocytes: 0.05 10*3/uL (ref 0.00–0.07)
Basophils Absolute: 0 10*3/uL (ref 0.0–0.1)
Basophils Relative: 0 %
Eosinophils Absolute: 0 10*3/uL (ref 0.0–0.5)
Eosinophils Relative: 1 %
HCT: 36.3 % (ref 36.0–46.0)
Hemoglobin: 12.5 g/dL (ref 12.0–15.0)
Immature Granulocytes: 1 %
Lymphocytes Relative: 13 %
Lymphs Abs: 0.8 10*3/uL (ref 0.7–4.0)
MCH: 34.2 pg — ABNORMAL HIGH (ref 26.0–34.0)
MCHC: 34.4 g/dL (ref 30.0–36.0)
MCV: 99.5 fL (ref 80.0–100.0)
Monocytes Absolute: 0.5 10*3/uL (ref 0.1–1.0)
Monocytes Relative: 8 %
Neutro Abs: 4.8 10*3/uL (ref 1.7–7.7)
Neutrophils Relative %: 77 %
Platelets: 336 10*3/uL (ref 150–400)
RBC: 3.65 MIL/uL — ABNORMAL LOW (ref 3.87–5.11)
RDW: 12.7 % (ref 11.5–15.5)
WBC: 6.2 10*3/uL (ref 4.0–10.5)
nRBC: 0 % (ref 0.0–0.2)

## 2020-04-20 LAB — PHOSPHORUS: Phosphorus: 2.3 mg/dL — ABNORMAL LOW (ref 2.5–4.6)

## 2020-04-20 LAB — SARS CORONAVIRUS 2 BY RT PCR (HOSPITAL ORDER, PERFORMED IN ~~LOC~~ HOSPITAL LAB): SARS Coronavirus 2: NEGATIVE

## 2020-04-20 LAB — COMPREHENSIVE METABOLIC PANEL
ALT: 31 U/L (ref 0–44)
AST: 34 U/L (ref 15–41)
Albumin: 2.8 g/dL — ABNORMAL LOW (ref 3.5–5.0)
Alkaline Phosphatase: 101 U/L (ref 38–126)
Anion gap: 11 (ref 5–15)
BUN: 10 mg/dL (ref 8–23)
CO2: 24 mmol/L (ref 22–32)
Calcium: 8.6 mg/dL — ABNORMAL LOW (ref 8.9–10.3)
Chloride: 96 mmol/L — ABNORMAL LOW (ref 98–111)
Creatinine, Ser: 0.69 mg/dL (ref 0.44–1.00)
GFR calc Af Amer: >60 mL/min (ref >60)
GFR calc non Af Amer: >60 mL/min (ref >60)
Glucose, Bld: 118 mg/dL — ABNORMAL HIGH (ref 70–99)
Potassium: 3.9 mmol/L (ref 3.5–5.1)
Sodium: 131 mmol/L — ABNORMAL LOW (ref 135–145)
Total Bilirubin: 1 mg/dL (ref 0.3–1.2)
Total Protein: 6.7 g/dL (ref 6.5–8.1)

## 2020-04-20 LAB — MAGNESIUM: Magnesium: 2.3 mg/dL (ref 1.7–2.4)

## 2020-04-20 MED ORDER — BISACODYL 10 MG RE SUPP: 10.0000 mg | Freq: Every day | RECTAL | 0 refills | Status: AC | PRN

## 2020-04-20 MED ORDER — CYCLOBENZAPRINE HCL 5 MG PO TABS: 5.0000 mg | ORAL_TABLET | Freq: Three times a day (TID) | ORAL | 0 refills | Status: AC

## 2020-04-20 MED ORDER — CLOPIDOGREL BISULFATE 75 MG PO TABS: 75.0000 mg | ORAL_TABLET | Freq: Every day | ORAL | 0 refills | Status: AC

## 2020-04-20 MED ORDER — CLOPIDOGREL BISULFATE 75 MG PO TABS
75.0000 mg | ORAL_TABLET | Freq: Every day | ORAL | Status: DC
  Administered 2020-04-20: 75 mg via ORAL
  Filled 2020-04-20: qty 1

## 2020-04-20 MED ORDER — SENNOSIDES-DOCUSATE SODIUM 8.6-50 MG PO TABS: 2.0000 | ORAL_TABLET | Freq: Two times a day (BID) | ORAL | 0 refills | Status: AC

## 2020-04-20 NOTE — TOC Progression Note (Signed)
Transition of Care [facility]) - Progression Note    Patient Details  Name: Sible Straley MRN: [id number] Date of Birth: August 17, 1950  Transition of Care [facility]) CM/SW Contact  Barrie Dunker, RN Phone Number: 04/20/2020, 3:32 PM  Clinical Narrative: DC packet completed and placed on the chart ss number obtained from [facility] as   [id number]    Expected Discharge Plan: Home w Home Health Services Barriers to Discharge: Continued Medical Work up  Expected Discharge Plan and Services Expected Discharge Plan: Home w Home Health Services   Discharge Planning Services: CM Consult Post Acute Care Choice: Home Health Living arrangements for the past 2 months: Single Family Home Expected Discharge Date: 04/20/20                                     Social Determinants of Health (SDOH) Interventions    Readmission Risk Interventions No flowsheet data found.

## 2020-04-20 NOTE — Discharge Summary (Addendum)
Physician Discharge Summary  Stephanie Mason VWU:[id number] DOB: May 28, 1951 DOA: 04/09/2020  PCP: Patient, No Pcp Per  Admit date: 04/09/2020 Discharge date: 04/20/2020  Admitted From: Home Disposition: SNF  Recommendations for Outpatient Follow-up:  1. Follow up with PCP in 1-2 weeks 2. Follow up with Neurology within 2-4 Weeks 3. Please obtain CMP/CBC, Mag, Phos in one week 4. Please follow up on the following pending results:  Home Health: No Equipment/Devices: None  Discharge Condition: Stable CODE STATUS: FULL CODE Diet recommendation: Dysphagia 3 Diet   Brief/Interim Summary: The patient is a 69 year old African-American female with a past medical history of recurrent stroke and left hemiparesis who presented with a new headache and worsening left-sided weakness.  She had associated nausea and vomiting with this and her boyfriend thought she was having a stroke but she started having slurred speech and admitted to having blurry vision right eye.  She is sent to the ED by the pace nurse to rule out stroke and in the ED head CT was done which showed acute right-sided strokes.  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  Neurology evaluated the Gramig stroke secondary to carotid disease versus embolic versus hypercoagulable state but her hypercoagulable work-up was negative.  Neurology recommended aspirin daily and held her statin due to transaminitis and intolerance.  They recommended Zio patch and a loop recorder to monitor for proximal A. fib in the outpatient setting.  During her hospitalization she had fevers of unclear etiology and it was felt that the cause of the fever was central in origin given that she has been off of antibiotics and her fevers are improving.  Of note she is getting close to being medically stable to be discharged and her skilled nursing facility will not take her back until her temperature is  less than 100.  Her T-max in last 24 hours has been 100.2.  ID has signed off the case and do not feel that she has a sinusitis.  EP to place a Zio patch prior to discharge in the morning if her temperature is less than 100 for last 24 hours.  After discussion with Neurology Dr. Otelia Limes today they recommended transitioning the patient off of aspirin to Plavix given that she was on aspirin prior to admission. She is stable to D/C to SNF and follow up with Neurology and PCP within the outpatient setting.   Discharge Diagnoses:  Active Problems:   Acute CVA (cerebrovascular accident) (HCC)   Stroke (cerebrum) (HCC)  Acute RIGHT MCA/PCAstroke in a patient with Chronic Right ACA and MCA Infarction -Patient admitted for new stroke. MRI brain confirmed stroke as it showed Acute inferior Division of Right MCA territory infarct and acute Right PCA territory infarct and a large posterior communicating artery present on the Right. Outside window for tPA. -Angiography showed normal left ICA but occluded intracranial right ICA (of note, there was no disease here in 2014 at time of her last stroke, at which time she had MRA head and carotid US). -Non-invasive angiography showed occluded RIGHT ICA -Transesophageal echocardiogram showed no cardiogenic source of embolism -Hypercoaguable Workup done and Negative   -Statin contraindicated due to prior intolerance and Transaminitis  -Aspirin ordered at admission but not resumed initially but will now continue low-dose Aspirin 81 mg po Daily but after discussion with the patient and neurology.  Dr. Otelia Limes recommended switching to Plavix given that she had been on aspirin prior to admission -She will  need Zio Patch at D/C and if no evidence of A Fib on that will need a Loop Recorder to Monitor for Paroxsymal A Fib; EP to place prior to D/C -Dysphagia screen ordered in ER -PT eval ordered: recommended SNF -Smoking cessation: not pertinent -Given that the patient  is largely paralyzed now, we will obtain a low air loss bed -Goal BP is Normotensive -C/w Telemetry Monitoring and Frequent Neurochecks -Will be discharged to SNF  Fever -Etiology unclear. It was presumed infection and so Unasyn was started, but she is now completed 5 days of Unasyn and continued to spike fever.  -Carefully reviewed for source of infection.She hasleftleg pain, but examination of the leg is normal, left foot x-ray normal and exam benign. LE venous Duplex Negative for DVT -No meningismus, doubt meningitis.  -No URI symptoms to suggest respiratory virus. CXR clear. -Sinusitis on CT but no sinus symptoms or signs on exam, doubt sinusitis, didn't respond to Unasyn. -TEE and Bcx rules out endocarditis (1/4 + but likely contaminant).  -Urine culture negative. MRI neck unremarkable. CT abdomen and pelvis unremarkable. -She continued to fever here. Given the discordance between HR and fever, suspect the fever may be neurogenic in origin.  -Constipation less likely the cause. -We have now stopped antibiotics without clinical change and will continue to Monitor fever curveoff antibiotics -Repeated blood cultures and showed NGTD at 2 Days from Blood Cx 9/6 -Obtained Repeat CT head to evaluate for further edema and showed "No acute intracranial abnormality. 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 Minimal, 2 mm, right to left midline shift. No evidence of acute hemorrhage. Chronic right maxillary sinusitis" -SNF wants the patient's Temperature below 100 -Fever Curve is improving. TMax the last 24 hours was 100.2 and has been afebrile all day -Continue to Monitor Temperature Curve; Has no Leukocytosis (WBC is 6.2)  Hypokalemia -Resolved. K+ is now 3.9 -Continue to Monitor and Replete as Necessary -Repeat CMP in the AM   Hyponatremia -Mild, asymptomatic -Sodium has slightly improved and went from 129 ->  130 -> 131 -Expectant management  Blood Culture Positive -1/4 cultures with CoNS, likely contaminant. -ID was consulted given her Fevers  and likely the fever was central in nature given that she has no leukocytosis  Torticollis Severe left sided spasticity -Her left sided spasticity and torticollishave progressed substantially this admission.  -The torticollis seems to be the result of her severe left-sided spasticity and hemiparesis, which are obviously from her stroke. -Continue new Cyclobenzaprine 5 mg po TID  -She will need to be referred for Botox after discharge for torticollis and spasticity  Constipation -Augmented bowel regimen and was given a Bisacodyl Suppository 10 mg x1 this AM; C/w Bisacodyl Suppository 10 mg RC Daily prn Severe Constipation, Miralax 17 g po BID, and with Senna-Docusate 2 tab po BID  -Continue bowel regimen at discharge  Macrocytic Anemia -Patient's Hgb/Hct is now stable at 12.5/36.3 -Check Anemia Panel as an outpatient  -Continue to Monitor for S/Sx of Bleeding; Currently no overt bleeding noted -Repeat CBC in the AM   Discharge Instructions  Discharge Instructions    Call MD for:  difficulty breathing, headache or visual disturbances   Complete by: As directed    Call MD for:  extreme fatigue   Complete by: As directed    Call MD for:  hives   Complete by: As directed    Call MD for:  persistant dizziness or light-headedness   Complete by:  As directed    Call MD for:  persistant nausea and vomiting   Complete by: As directed    Call MD for:  redness, tenderness, or signs of infection (pain, swelling, redness, odor or green/yellow discharge around incision site)   Complete by: As directed    Call MD for:  severe uncontrolled pain   Complete by: As directed    Call MD for:  temperature >100.4   Complete by: As directed    Diet - low sodium heart healthy   Complete by: As directed    Dysphagia 3 Diet   Discharge instructions    Complete by: As directed    You were cared for by a hospitalist during your hospital stay. If you have any questions about your discharge medications or the care you received while you were in the hospital after you are discharged, you can call the unit and ask to speak with the hospitalist on call if the hospitalist that took care of you is not available. Once you are discharged, your primary care physician will handle any further medical issues. Please note that NO REFILLS for any discharge medications will be authorized once you are discharged, as it is imperative that you return to your primary care physician (or establish a relationship with a primary care physician if you do not have one) for your aftercare needs so that they can reassess your need for medications and monitor your lab values.  Follow up with PCP and Neurology in the outpatient setting. Take all medications as prescribed. If symptoms change or worsen please return to the ED for evaluation   Increase activity slowly   Complete by: As directed      Allergies as of 04/20/2020   No Known Allergies     Medication List    STOP taking these medications   aspirin EC 81 MG tablet   senna 8.6 MG tablet Commonly known as: SENOKOT     TAKE these medications   acetaminophen 650 MG CR tablet Commonly known as: TYLENOL Take 1,300 mg by mouth every 8 (eight) hours as needed for pain.   bisacodyl 10 MG suppository Commonly known as: DULCOLAX Place 1 suppository (10 mg total) rectally daily as needed for severe constipation.   cholecalciferol 25 MCG (1000 UNIT) tablet Commonly known as: VITAMIN D3 Take 1,000 Units by mouth daily.   clopidogrel 75 MG tablet Commonly known as: PLAVIX Take 1 tablet (75 mg total) by mouth daily.   cyclobenzaprine 5 MG tablet Commonly known as: FLEXERIL Take 1 tablet (5 mg total) by mouth 3 (three) times daily.   fluticasone 50 MCG/ACT nasal spray Commonly known as: Flonase Place 1 spray  into both nostrils daily for 7 days.   gabapentin 100 MG capsule Commonly known as: NEURONTIN Take 200 mg by mouth in the morning.   gabapentin 400 MG capsule Commonly known as: NEURONTIN Take 400 mg by mouth at bedtime.   metoCLOPramide 10 MG tablet Commonly known as: REGLAN Take 1 tablet (10 mg total) by mouth every 8 (eight) hours as needed for up to 5 days for nausea (headache).   mometasone 50 MCG/ACT nasal spray Commonly known as: NASONEX Place 2 sprays into the nose daily as needed.   OPTIVE 0.5-0.9 % ophthalmic solution Generic drug: carboxymethylcellul-glycerin Place 1 drop into both eyes 4 (four) times daily as needed for dry eyes.   polyethylene glycol 17 g packet Commonly known as: MIRALAX / GLYCOLAX Take 17 g by mouth daily as  needed.   senna-docusate 8.6-50 MG tablet Commonly known as: Senokot-S Take 2 tablets by mouth 2 (two) times daily.       Contact information for follow-up providers    Go to  [facility] EMERGENCY DEPARTMENT.   Specialty: Emergency Medicine Why: If symptoms worsen Contact information: [address] [id number] [place] [address] [phone]       [facility] In 1 week.        [facility]. Call.   Why: Follow up within 2-4 weeks for Stroke  Contact information: [address] [place] [address] [phone]           Contact information for after-discharge care    Destination    [facility] .   Service: Skilled Nursing Contact information: [address] [place] [address] [phone]                 No Known Allergies  Consultations:  Neurology  Infectious Diseases  Procedures/Studies: CT ANGIO HEAD W OR WO CONTRAST  Addendum Date: 04/10/2020   ADDENDUM REPORT: 04/10/2020 18:09 ADDENDUM: These results were called by telephone at the time of interpretation on  04/10/2020 at 6:09 pm to provider Dr. Arthur Holms , who verbally acknowledged these results. Electronically Signed   By: Jackey Loge DO   On: 04/10/2020 18:09   Result Date: 04/10/2020 CLINICAL DATA:  Neuro deficit, acute stroke suspected. EXAM: CT ANGIOGRAPHY HEAD AND NECK TECHNIQUE: Multidetector CT imaging of the head and neck was performed using the standard protocol during bolus administration of intravenous contrast. Multiplanar CT image reconstructions and MIPs were obtained to evaluate the vascular anatomy. Carotid stenosis measurements (when applicable) are obtained utilizing NASCET criteria, using the distal internal carotid diameter as the denominator. CONTRAST:  59mL OMNIPAQUE IOHEXOL 350 MG/ML SOLN COMPARISON:  Brain MRI 04/10/2020, head CT 04/09/2020, MRA head 09/15/2012. FINDINGS: CT HEAD FINDINGS Brain: Again demonstrated is extensive acute on chronic infarcts within the right cerebral hemisphere with acute infarcts affecting the right MCA and PCA vascular territories. Similar degree of mass effect. Associated ex vacuo dilatation of the frontal horn of the right lateral ventricle. No midline shift. No evidence of hemorrhagic conversion. No extra-axial fluid collection. No evidence of intracranial mass. Vascular: Reported below. Skull: Normal. Negative for fracture or focal lesion. Sinuses: Extensive partial opacification of the right maxillary sinus. No significant mastoid effusion. Orbits: No acute abnormality. Review of the MIP images confirms the above findings CTA NECK FINDINGS Aortic arch: Common origin of the innominate and left common carotid arteries. The visualized aortic arch is unremarkable. No hemodynamically significant innominate or proximal subclavian artery stenosis. Right carotid system: The CCA is patent to the bifurcation without significant stenosis. Soft tissue plaque within the ICA bulb without hemodynamically significant stenosis (50% or greater). Distal to this, there is a  markedly diminutive appearance of the cervical ICA. The intracranial left ICA becomes occluded at the cavernous segment and remains occluded to the terminus. Left carotid system: CCA and ICA patent within the neck without hemodynamically significant stenosis (50% or greater). Mild soft plaque within the carotid bifurcation and proximal ICA. Vertebral arteries: The vertebral arteries are patent within the neck bilaterally without hemodynamically significant stenosis. The left vertebral artery is dominant. Skeleton: No acute bony abnormality or aggressive osseous lesion. Other neck: No neck mass or cervical lymphadenopathy. Upper chest: No consolidation within the imaged lung apices. Review of the MIP images confirms  the above findings CTA HEAD FINDINGS Anterior circulation: The pre cavernous right ICA is severely stenotic. There is occlusion of the right ICA beginning at the cavernous segment and this vessel remains occluded through the ICA terminus. There is non opacification of the right middle cerebral artery vessels. Additionally, there is non opacification of the A1 right anterior cerebral artery. The A2 and more distal right anterior cerebral artery is markedly diminutive with multifocal high-grade stenoses throughout. The intracranial left ICA is patent without significant stenosis. The M1 left middle cerebral artery is patent without significant stenosis. No left M2 proximal branch occlusion or high-grade proximal stenosis is identified. The left anterior cerebral artery is patent without high-grade proximal stenosis. There is mild fusiform dilation of the supraclinoid left ICA and left ICA terminus which may reflect an aneurysm. Posterior circulation: The non dominant intracranial right vertebral artery is developmentally diminutive, but patent and terminates as the right PICA. The dominant intracranial left vertebral artery is patent without significant stenosis, as is the basilar artery. The right  posterior cerebral artery appears predominantly fetal in origin and the right posterior communicating artery appears occluded. There is flow within a hypoplastic right P1 segment and within the P2 and more distal right posterior cerebral artery. Fetal origin left posterior cerebral artery which is patent without significant proximal stenosis. Venous sinuses: Within limitations of contrast timing, no convincing thrombus. Anatomic variants: None significant Review of the MIP images confirms the above findings IMPRESSION: CT head: Redemonstrated extensive acute on chronic infarcts within the right cerebral hemisphere with acute infarcts affecting the right MCA and PCA vascular territories. No evidence of hemorrhagic conversion. Similar degree of mass effect. No midline shift. CTA neck: 1. There is soft plaque within the right carotid bifurcation/ICA bulb without hemodynamically significant stenosis. However, distal to this, the right cervical ICA is markedly asymmetrically diminutive. 2. The left common carotid artery, cervical internal carotid artery and bilateral vertebral arteries are patent within the neck without hemodynamically significant stenosis. CTA head: 1. 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 The right ICA becomes occluded at the cavernous segment and remains occluded through the supraclinoid segment. There is complete non opacification of the right middle cerebral arteries. Additionally, there is non opacification of the A1 right anterior cerebral artery. The A2 and more distal right anterior cerebral artery is markedly diminutive with multifocal high-grade stenoses. There is a fetal origin right posterior cerebral artery with occluded right posterior communicating artery. There is reconstitution of flow within the P2 and more distal right PCA. 2. 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 Electronically Signed: By: Jackey Loge DO On:  04/10/2020 17:51   DG Chest 2 View  Result Date: 04/17/2020 CLINICAL DATA:  Fever EXAM: CHEST - 2 VIEW COMPARISON:  04/13/2020 FINDINGS: Mild patchy bibasilar opacities, suspicious for atelectasis. No frank interstitial edema. No pleural effusion or pneumothorax. Mild eventration of the right hemidiaphragm. The heart is normal in size. IMPRESSION: Mild bibasilar atelectasis. Electronically Signed   By: Charline Bills M.D.   On: 04/17/2020 11:58   CT HEAD WO CONTRAST  Result Date: 04/18/2020 CLINICAL DATA:  Mental status change, unknown cause EXAM: CT HEAD WITHOUT CONTRAST TECHNIQUE: Contiguous axial images were obtained from the base of the skull through the vertex without intravenous contrast. 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 There is no evidence of acute hemorrhage. There  is minimal, 2 mm, right to left midline shift. No hydrocephalus. No evidence of new hemorrhage. The basilar cisterns are patent. Cerebellum is unremarkable. Vascular: No hyperdense vessel. Skull: No fracture or focal lesion. Sinuses/Orbits: Chronic mucosal thickening and opacification of the right maxillary sinus with cortical thickening. No new findings. Other: None. IMPRESSION: 1. No acute intracranial abnormality. 2. 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 Minimal, 2 mm, right to left midline shift. No evidence of acute hemorrhage. 3. Chronic right maxillary sinusitis. Electronically Signed   By: Narda Rutherford M.D.   On: 04/18/2020 17:20   CT HEAD WO CONTRAST  Result Date: 04/11/2020 CLINICAL DATA:  Follow-up stroke. EXAM: CT HEAD WITHOUT CONTRAST TECHNIQUE: Contiguous axial images were obtained from the base of the skull through the vertex without intravenous contrast. COMPARISON:  CT and MR studies done yesterday. FINDINGS: Brain: Acute  infarction throughout the remainder of the right hemisphere as shown by studies yesterday. Old infarction in the right frontal region. The area of acute infarction shows mild swelling but there is no midline shift. No evidence of hemorrhage. No acute stroke seen affecting the left hemisphere, the brainstem or the cerebellum. Vascular: No new vascular finding. Skull: Negative Sinuses/Orbits: Opacified right maxillary sinus. Other visible sinuses are clear. Orbits negative. Other: None IMPRESSION: Old right frontal infarction. Acute infarction throughout the remainder of the right hemisphere as shown by studies yesterday. The area of acute infarction shows mild swelling but there is no midline shift. No evidence of hemorrhage. Electronically Signed   By: Paulina Fusi M.D.   On: 04/11/2020 18:38   CT Head Wo Contrast  Result Date: 04/09/2020 CLINICAL DATA:  Headache, prior stroke EXAM: CT HEAD WITHOUT CONTRAST TECHNIQUE: Contiguous axial images were obtained from the base of the skull through the vertex without intravenous contrast. COMPARISON:  None. FINDINGS: Brain: Large area of encephalomalacia involving the right frontal lobe. There is hypodensity involving the posterior right parietal lobe. No large extra-axial collections. No midline shift. There is ex vacuo dilatation of the right anterior horn of the lateral ventricle. Vascular: No hyperdense vessel or unexpected calcification. Skull: The skull is intact. No fracture or focal lesion identified. Sinuses/Orbits: The visualized paranasal sinuses and mastoid air cells are clear. The orbits and globes intact. Other: None Osseous: No acute fracture or other significant osseous abnormality.The nasal bone, mandibles, zygomatic arches and pterygoid plates are intact. Orbits: No fracture identified. Unremarkable appearance of globes and orbits. Sinuses: There is near complete opacification of the right maxillary sinus. A small amount of mucosal thickening seen  within the left maxillary sinus. Soft tissues:  No acute findings. Limited intracranial: No acute findings. IMPRESSION: Large area of encephalomalacia involving the right MCA territory with hypodensity involving the right posterior parietal lobe likely chronic or subacute infarct. If clinical concern remains would recommend MRI for further evaluation. Findings suggestive of right maxillary sinusitis. Electronically Signed   By: Jonna Clark M.D.   On: 04/09/2020 22:10   CT ANGIO NECK W OR WO CONTRAST  Addendum Date: 04/10/2020   ADDENDUM REPORT: 04/10/2020 18:09 ADDENDUM: These results were called by telephone at the time of interpretation on 04/10/2020 at 6:09 pm to provider Dr. Arthur Holms , who verbally acknowledged these results. Electronically Signed   By: Jackey Loge DO   On: 04/10/2020 18:09   Result Date: 04/10/2020 CLINICAL DATA:  Neuro deficit, acute stroke suspected. EXAM: CT ANGIOGRAPHY HEAD AND NECK TECHNIQUE: Multidetector CT imaging of  the head and neck was performed using the standard protocol during bolus administration of intravenous contrast. Multiplanar CT image reconstructions and MIPs were obtained to evaluate the vascular anatomy. Carotid stenosis measurements (when applicable) are obtained utilizing NASCET criteria, using the distal internal carotid diameter as the denominator. CONTRAST:  75mL OMNIPAQUE IOHEXOL 350 MG/ML SOLN COMPARISON:  Brain MRI 04/10/2020, head CT 04/09/2020, MRA head 09/15/2012. FINDINGS: CT HEAD FINDINGS Brain: Again demonstrated is extensive acute on chronic infarcts within the right cerebral hemisphere with acute infarcts affecting the right MCA and PCA vascular territories. Similar degree of mass effect. Associated ex vacuo dilatation of the frontal horn of the right lateral ventricle. No midline shift. No evidence of hemorrhagic conversion. No extra-axial fluid collection. No evidence of intracranial mass. Vascular: Reported below. Skull: Normal. Negative  for fracture or focal lesion. Sinuses: Extensive partial opacification of the right maxillary sinus. No significant mastoid effusion. Orbits: No acute abnormality. Review of the MIP images confirms the above findings CTA NECK FINDINGS Aortic arch: Common origin of the innominate and left common carotid arteries. The visualized aortic arch is unremarkable. No hemodynamically significant innominate or proximal subclavian artery stenosis. Right carotid system: The CCA is patent to the bifurcation without significant stenosis. Soft tissue plaque within the ICA bulb without hemodynamically significant stenosis (50% or greater). Distal to this, there is a markedly diminutive appearance of the cervical ICA. The intracranial left ICA becomes occluded at the cavernous segment and remains occluded to the terminus. Left carotid system: CCA and ICA patent within the neck without hemodynamically significant stenosis (50% or greater). Mild soft plaque within the carotid bifurcation and proximal ICA. Vertebral arteries: The vertebral arteries are patent within the neck bilaterally without hemodynamically significant stenosis. The left vertebral artery is dominant. Skeleton: No acute bony abnormality or aggressive osseous lesion. Other neck: No neck mass or cervical lymphadenopathy. Upper chest: No consolidation within the imaged lung apices. Review of the MIP images confirms the above findings CTA HEAD FINDINGS Anterior circulation: The pre cavernous right ICA is severely stenotic. There is occlusion of the right ICA beginning at the cavernous segment and this vessel remains occluded through the ICA terminus. There is non opacification of the right middle cerebral artery vessels. Additionally, there is non opacification of the A1 right anterior cerebral artery. The A2 and more distal right anterior cerebral artery is markedly diminutive with multifocal high-grade stenoses throughout. The intracranial left ICA is patent without  significant stenosis. The M1 left middle cerebral artery is patent without significant stenosis. No left M2 proximal branch occlusion or high-grade proximal stenosis is identified. The left anterior cerebral artery is patent without high-grade proximal stenosis. There is mild fusiform dilation of the supraclinoid left ICA and left ICA terminus which may reflect an aneurysm. Posterior circulation: The non dominant intracranial right vertebral artery is developmentally diminutive, but patent and terminates as the right PICA. The dominant intracranial left vertebral artery is patent without significant stenosis, as is the basilar artery. The right posterior cerebral artery appears predominantly fetal in origin and the right posterior communicating artery appears occluded. There is flow within a hypoplastic right P1 segment and within the P2 and more distal right posterior cerebral artery. Fetal origin left posterior cerebral artery which is patent without significant proximal stenosis. Venous sinuses: Within limitations of contrast timing, no convincing thrombus. Anatomic variants: None significant Review of the MIP images confirms the above findings IMPRESSION: CT head: Redemonstrated extensive acute on chronic infarcts within the right cerebral hemisphere with  acute infarcts affecting the right MCA and PCA vascular territories. No evidence of hemorrhagic conversion. Similar degree of mass effect. No midline shift. CTA neck: 1. There is soft plaque within the right carotid bifurcation/ICA bulb without hemodynamically significant stenosis. However, distal to this, the right cervical ICA is markedly asymmetrically diminutive. 2. The left common carotid artery, cervical internal carotid artery and bilateral vertebral arteries are patent within the neck without hemodynamically significant stenosis. CTA head: 1. 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 The right ICA becomes occluded at the cavernous  segment and remains occluded through the supraclinoid segment. There is complete non opacification of the right middle cerebral arteries. Additionally, there is non opacification of the A1 right anterior cerebral artery. The A2 and more distal right anterior cerebral artery is markedly diminutive with multifocal high-grade stenoses. There is a fetal origin right posterior cerebral artery with occluded right posterior communicating artery. There is reconstitution of flow within the P2 and more distal right PCA. 2. 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 Electronically Signed: By: Jackey Loge DO On: 04/10/2020 17:51   MR BRAIN WO CONTRAST  Result Date: 04/10/2020 CLINICAL DATA:  Stroke follow-up. History of stroke with new headache. EXAM: MRI HEAD WITHOUT CONTRAST TECHNIQUE: Multiplanar, multiecho pulse sequences of the brain and surrounding structures were obtained without intravenous contrast. COMPARISON:  Head CT from yesterday FINDINGS: Brain: Large remote right cerebral infarct affecting the ACA territory and upper division MCA territory with dense encephalomalacia. Restricted diffusion is seen in the remaining right cerebral cortex, also involving white matter. This is attributed to infarct rather than seizure phenomenon given the this spans both the MCA and PCA distributions but is still most likely acute infarct. The right PCA territory appeared spared on prior head CT but is involved on this study. Wallerian degeneration in the right cortical spinal tracts. No acute infarct in the left hemisphere or posterior fossa. No mass, hydrocephalus, or acute hemorrhage. Vascular: No flow void seen in the right ICA until the supraclinoid segment. Skull and upper cervical spine: Normal marrow signal Sinuses/Orbits: Bilateral maxillary sinus mucosal thickening with fluid/secretions on the right. Maxillofacial CT was performed yesterday. IMPRESSION: 1. Acute inferior  division right MCA territory infarct. Acute right PCA territory infarct (which occurred even more recently based on the prior head CT). 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 2. Pre-existing right ACA and upper division MCA territory infarcts. 3. 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 Electronically Signed   By: Marnee Spring M.D.   On: 04/10/2020 08:13   CT ABDOMEN PELVIS W CONTRAST  Result Date: 04/17/2020 CLINICAL DATA:  Abdominal pain, fever EXAM: CT ABDOMEN AND PELVIS WITH CONTRAST TECHNIQUE: Multidetector CT imaging of the abdomen and pelvis was performed using the standard protocol following bolus administration of intravenous contrast. CONTRAST:  OMNIPAQUE IOHEXOL 300 MG/ML  SOLN COMPARISON:  None. FINDINGS: Lower chest: Mild patchy bilateral lower lobe opacities, likely atelectasis. Hepatobiliary: Liver is within normal limits. Gallbladder is unremarkable. No intrahepatic or extrahepatic ductal dilatation. Pancreas: Within normal limits. Spleen: Within normal limits. Adrenals/Urinary Tract: Adrenal glands are within normal limits. Excretory contrast in the bilateral renal collecting systems. 7 mm left upper pole renal cyst (series 2/image 25). No hydronephrosis. Bladder is within normal limits. Stomach/Bowel: Contrast within the distal esophagus, possibly reflecting esophageal dysmotility or reflux, nonspecific. Tiny hiatal hernia. No evidence of bowel obstruction. Normal appendix (series  2/image 57). Sigmoid diverticulosis, without evidence of diverticulitis. Vascular/Lymphatic: No evidence of abdominal aortic aneurysm. Atherosclerotic calcifications of the abdominal aorta and branch vessels. No suspicious abdominopelvic lymphadenopathy. Reproductive: The uterus is within normal limits. No adnexal masses. Other: No abdominopelvic ascites. Musculoskeletal: Degenerative changes of the lumbar spine. IMPRESSION: No evidence of bowel obstruction. Normal  appendix. Sigmoid diverticulosis, without evidence of diverticulitis. No CT findings to account for the patient's abdominal pain. Electronically Signed   By: Charline Bills M.D.   On: 04/17/2020 11:57   US Venous Img Lower Bilateral (DVT)  Result Date: 04/18/2020 CLINICAL DATA:  Leg pain EXAM: BILATERAL LOWER EXTREMITY VENOUS DOPPLER ULTRASOUND TECHNIQUE: Gray-scale sonography with compression, as well as color and duplex ultrasound, were performed to evaluate the deep venous system(s) from the level of the common femoral vein through the popliteal and proximal calf veins. COMPARISON:  None. FINDINGS: VENOUS Normal compressibility of the common femoral, superficial femoral, and popliteal veins, as well as the visualized calf veins. Visualized portions of profunda femoral vein and great saphenous vein unremarkable. No filling defects to suggest DVT on grayscale or color Doppler imaging. Doppler waveforms show normal direction of venous flow, normal respiratory plasticity and response to augmentation. OTHER None. Limitations: none IMPRESSION: Negative. Electronically Signed   By: Katherine Mantle M.D.   On: 04/18/2020 20:11   DG Chest Port 1 View  Result Date: 04/13/2020 CLINICAL DATA:  Fever. Left foot pain. History of diabetes and hypertension. EXAM: PORTABLE CHEST 1 VIEW COMPARISON:  09/15/2012 FINDINGS: Mild hazy opacity is noted at the lung bases, likely atelectasis. Pneumonia is possible. Remainder of the lungs is clear. No convincing pleural effusion.  No pneumothorax. Cardiac silhouette is normal in size. No mediastinal or hilar masses. Skeletal structures are grossly intact. IMPRESSION: 1. Hazy lung base opacities most likely due to atelectasis. Pneumonia is possible. No other evidence of active cardiopulmonary disease. Electronically Signed   By: Amie Portland M.D.   On: 04/13/2020 16:51   DG Foot 2 Views Left  Result Date: 04/13/2020 CLINICAL DATA:  Fever and left foot pain.  History of  diabetes. EXAM: LEFT FOOT - 2 VIEW COMPARISON:  None. FINDINGS: No fracture or bone lesion. There is no bone resorption to suggest osteomyelitis. 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 Small plantar calcaneal spur. Tissues are unremarkable. IMPRESSION: 1. No fracture.  No evidence of osteomyelitis. Electronically Signed   By: Amie Portland M.D.   On: 04/13/2020 16:53   ECHOCARDIOGRAM COMPLETE  Result Date: 04/10/2020    ECHOCARDIOGRAM REPORT   Patient Name:   MORGAN Reardon Date of Exam: 04/10/2020 Medical Rec #:  [id number]       Height:       67.0 in Accession #:    [id number]      Weight:       147.0 lb Date of Birth:  1950-12-14      BSA:          1.774 m Patient Age:    68 years        BP:           Not listed in chart/Not listed in                                               chart mmHg Patient Gender: F  HR:           84 bpm. Exam Location:  [facility] Procedure: 2D Echo, Cardiac Doppler and Color Doppler Indications:     Stroke 434.91  History:         Patient has no prior history of Echocardiogram examinations.                  Stroke; Risk Factors:Hypertension and Diabetes.  Sonographer:     Cristela Blue RDCS (AE) Referring Phys:  [id number] Dara Lords AROOR Diagnosing Phys: Lorine Bears MD  Sonographer Comments: No apical window, no subcostal window and Technically challenging study due to limited acoustic windows. IMPRESSIONS  1. Left ventricular ejection fraction, by estimation, is 60 to 65%. The left ventricle has normal function. The left ventricle has no regional wall motion abnormalities. Left ventricular diastolic function could not be evaluated.  2. Right ventricular systolic function is normal. The right ventricular size is normal. There is mildly elevated pulmonary artery systolic pressure.  3. The mitral valve is normal in structure. No evidence of mitral valve regurgitation. No evidence of mitral stenosis.  4. The aortic valve is normal in structure. Aortic valve  regurgitation is not visualized. No aortic stenosis is present.  5. The inferior vena cava is normal in size with greater than 50% respiratory variability, suggesting right atrial pressure of 3 mmHg.  6. No apical or subcostal windows. FINDINGS  Left Ventricle: Left ventricular ejection fraction, by estimation, is 60 to 65%. The left ventricle has normal function. The left ventricle has no regional wall motion abnormalities. The left ventricular internal cavity size was normal in size. There is  no left ventricular hypertrophy. Left ventricular diastolic function could not be evaluated. Right Ventricle: The right ventricular size is normal. No increase in right ventricular wall thickness. Right ventricular systolic function is normal. There is mildly elevated pulmonary artery systolic pressure. The tricuspid regurgitant velocity is 2.99  m/s, and with an assumed right atrial pressure of 3 mmHg, the estimated right ventricular systolic pressure is 38.8 mmHg. Left Atrium: Left atrial size was normal in size. Right Atrium: Right atrial size was normal in size. Pericardium: There is no evidence of pericardial effusion. Mitral Valve: The mitral valve is normal in structure. Normal mobility of the mitral valve leaflets. No evidence of mitral valve regurgitation. No evidence of mitral valve stenosis. Tricuspid Valve: The tricuspid valve is normal in structure. Tricuspid valve regurgitation is trivial. No evidence of tricuspid stenosis. Aortic Valve: The aortic valve is normal in structure. Aortic valve regurgitation is not visualized. No aortic stenosis is present. Pulmonic Valve: The pulmonic valve was normal in structure. Pulmonic valve regurgitation is not visualized. No evidence of pulmonic stenosis. Aorta: The aortic root is normal in size and structure. Venous: The inferior vena cava was not well visualized. The inferior vena cava is normal in size with greater than 50% respiratory variability, suggesting right  atrial pressure of 3 mmHg. IAS/Shunts: The interatrial septum was not well visualized.  LEFT VENTRICLE PLAX 2D LVIDd:         3.40 cm LVIDs:         1.83 cm LV PW:         0.96 cm LV IVS:        0.90 cm LVOT diam:     2.10 cm LVOT Area:     3.46 cm  LEFT ATRIUM         Index LA diam:    2.90 cm 1.63 cm/m  PULMONIC VALVE AORTA                 PV Vmax:        0.76 m/s Ao Root diam: 2.50 cm PV Peak grad:   2.3 mmHg                       RVOT Peak grad: 2 mmHg  TRICUSPID VALVE TR Peak grad:   35.8 mmHg TR Vmax:        299.00 cm/s  SHUNTS Systemic Diam: 2.10 cm Lorine Bears MD Electronically signed by Lorine Bears MD Signature Date/Time: 04/10/2020/6:00:27 PM    Final    MR NECK W WO CONTRAST  Result Date: 04/15/2020 CLINICAL DATA:  Neck abscess.  Fever.  Fever of unknown origin EXAM: MRI OF THE NECK WITH CONTRAST TECHNIQUE: Multiplanar, multisequence MR imaging was performed following the administration of intravenous contrast. CONTRAST:  7.29mL GADAVIST GADOBUTROL 1 MMOL/ML IV SOLN COMPARISON:  CTA head neck from 04/10/2020 FINDINGS: Pharynx and larynx: No evidence of mass or inflammation Salivary glands: Normal appearing and essentially symmetric. Thyroid: No mass or enlargement. Lymph nodes: None enlarged or abnormal signal. Vascular: Recent CTA.  No interval findings. Limited intracranial: Negative Visualized orbits: Trace coverage without detected abnormality Mastoids and visualized paranasal sinuses: Right maxillary sinusitis with mucosal thickening and central fluid. Skeleton: No acute or erosive finding. Upper chest: Negative IMPRESSION: No evidence of inflammation or infection in the neck. Negative for abscess. Electronically Signed   By: Marnee Spring M.D.   On: 04/15/2020 14:12   ECHO TEE  Result Date: 04/14/2020    TRANSESOPHOGEAL ECHO REPORT   Patient Name:   Berkleigh Widmann Date of Exam: 04/14/2020 Medical Rec #:  [id number]       Height:       67.0 in Accession #:     [id number]      Weight:       166.9 lb Date of Birth:  Oct 26, 1950      BSA:          1.873 m Patient Age:    68 years        BP:           137/92 mmHg Patient Gender: F               HR:           78 bpm. Exam Location:  [facility] Procedure: Transesophageal Echo, Color Doppler, Cardiac Doppler and Saline            Contrast Bubble Study Indications:     I63.9 Stroke  History:         Patient has prior history of Echocardiogram examinations, most                  recent 04/10/2020. Stroke; Risk Factors:Hypertension and                  Diabetes.  Sonographer:     Humphrey Rolls RDCS (AE) Referring Phys:  [id number] Sondra Barges Diagnosing Phys: Julien Nordmann MD PROCEDURE: TEE procedure time was 40 minutes. The transesophogeal probe was passed without difficulty through the esophogus of the patient. Local oropharyngeal anesthetic was provided with Cetacaine and Benzocaine spray. Sedation performed by performing physician. Image quality was excellent. The patient's vital signs; including heart rate, blood pressure, and oxygen saturation; remained stable throughout the procedure. The patient developed no complications during the procedure. IMPRESSIONS  1. Left ventricular ejection  fraction, by estimation, is 60 to 65%. The left ventricle has normal function. The left ventricle has no regional wall motion abnormalities. There is mild left ventricular hypertrophy.  2. Right ventricular systolic function is normal. The right ventricular size is normal.  3. Left atrial size was mildly dilated. No left atrial/left atrial appendage thrombus was detected.  4. No valve vegetation noted  5. No LA or LA appendage thrombus Conclusion(s)/Recommendation(s): Normal biventricular function without evidence of hemodynamically significant valvular heart disease. FINDINGS  Left Ventricle: Left ventricular ejection fraction, by estimation, is 60 to 65%. The left ventricle has normal function. The left ventricle has no regional wall motion  abnormalities. The left ventricular internal cavity size was normal in size. There is  mild left ventricular hypertrophy. Right Ventricle: The right ventricular size is normal. No increase in right ventricular wall thickness. Right ventricular systolic function is normal. Left Atrium: Left atrial size was mildly dilated. No left atrial/left atrial appendage thrombus was detected. Right Atrium: Right atrial size was normal in size. Pericardium: There is no evidence of pericardial effusion. Mitral Valve: The mitral valve is normal in structure. Normal mobility of the mitral valve leaflets. No evidence of mitral valve regurgitation. No evidence of mitral valve stenosis. Tricuspid Valve: The tricuspid valve is normal in structure. Tricuspid valve regurgitation is not demonstrated. No evidence of tricuspid stenosis. Aortic Valve: The aortic valve is normal in structure. Aortic valve regurgitation is not visualized. Mild to moderate aortic valve sclerosis/calcification is present, without any evidence of aortic stenosis. Pulmonic Valve: The pulmonic valve was normal in structure. Pulmonic valve regurgitation is not visualized. No evidence of pulmonic stenosis. Aorta: The aortic root is normal in size and structure. Venous: The inferior vena cava is normal in size with greater than 50% respiratory variability, suggesting right atrial pressure of 3 mmHg. IAS/Shunts: No atrial level shunt detected by color flow Doppler. Agitated saline contrast was given intravenously to evaluate for intracardiac shunting.   AORTA Ao Root diam: 2.80 cm Julien Nordmann MD Electronically signed by Julien Nordmann MD Signature Date/Time: 04/14/2020/4:06:05 PM    Final    CT Maxillofacial Wo Contrast  Result Date: 04/09/2020 CLINICAL DATA:  Headache, prior stroke EXAM: CT HEAD WITHOUT CONTRAST TECHNIQUE: Contiguous axial images were obtained from the base of the skull through the vertex without intravenous contrast. COMPARISON:  None. FINDINGS:  Brain: Large area of encephalomalacia involving the right frontal lobe. There is hypodensity involving the posterior right parietal lobe. No large extra-axial collections. No midline shift. There is ex vacuo dilatation of the right anterior horn of the lateral ventricle. Vascular: No hyperdense vessel or unexpected calcification. Skull: The skull is intact. No fracture or focal lesion identified. Sinuses/Orbits: The visualized paranasal sinuses and mastoid air cells are clear. The orbits and globes intact. Other: None Osseous: No acute fracture or other significant osseous abnormality.The nasal bone, mandibles, zygomatic arches and pterygoid plates are intact. Orbits: No fracture identified. Unremarkable appearance of globes and orbits. Sinuses: There is near complete opacification of the right maxillary sinus. A small amount of mucosal thickening seen within the left maxillary sinus. Soft tissues:  No acute findings. Limited intracranial: No acute findings. IMPRESSION: Large area of encephalomalacia involving the right MCA territory with hypodensity involving the right posterior parietal lobe likely chronic or subacute infarct. If clinical concern remains would recommend MRI for further evaluation. Findings suggestive of right maxillary sinusitis. Electronically Signed   By: Jonna Clark M.D.   On: 04/09/2020 22:10  Subjective: Seen and examined at bedside and she is doing better.  Was attempting to eat with assistance of her friend.  Denied any chest pain, lightheadedness or dizziness.  Feels okay.  No other concerns or complaints at this time.   Discharge Exam: Vitals:   04/20/20 0746 04/20/20 1129  BP: 139/74 (!) 195/173  Pulse: 84 94  Resp: 17 18  Temp: 97.9 F (36.6 C) 98 F (36.7 C)  SpO2: 100% 100%   Vitals:   04/19/20 2307 04/20/20 0452 04/20/20 0746 04/20/20 1129  BP: 113/63 105/70 139/74 (!) 195/173  Pulse: 97 86 84 94  Resp: 17 18 17 18   Temp: 98.7 F (37.1 C) 98.7 F (37.1 C)  97.9 F (36.6 C) 98 F (36.7 C)  TempSrc: Oral Oral  Oral  SpO2: 96% 100% 100% 100%  Weight:      Height:       General: Pt is alert, awake, not in acute distress clinical has some torticollis Cardiovascular: RRR, S1/S2 +, no rubs, no gallops Respiratory: Diminished bilaterally, no wheezing, no rhonchi Abdominal: Soft, NT, mildly distended, bowel sounds + Extremities: no edema, no cyanosis; has some hemiplegia  The results of significant diagnostics from this hospitalization (including imaging, microbiology, ancillary and laboratory) are listed below for reference.    Microbiology: Recent Results (from the past 240 hour(s))  Culture, blood (Routine X 2) w Reflex to ID Panel     Status: Abnormal   Collection Time: 04/12/20 10:43 AM   Specimen: BLOOD  Result Value Ref Range Status   Specimen Description   Final    BLOOD BACK OF LEFT HAND Performed at [facility], [address] [place], [place] [address]    Special Requests   Final    BOTTLES DRAWN AEROBIC AND ANAEROBIC Blood Culture adequate volume Performed at [facility], [address] [place], [place] [address]    Culture  Setup Time   Final    Organism ID to follow GRAM POSITIVE COCCI ANAEROBIC BOTTLE ONLY CRITICAL RESULT CALLED TO, READ BACK BY AND VERIFIED WITH: Princella [facility] 2153 04/13/20 HNM Performed at [facility], [address] [place], [place] [address]    Culture (A)  Final    STAPHYLOCOCCUS AURICULARIS THE SIGNIFICANCE OF ISOLATING THIS ORGANISM FROM A SINGLE SET OF BLOOD CULTURES WHEN MULTIPLE SETS ARE DRAWN IS UNCERTAIN. PLEASE NOTIFY THE MICROBIOLOGY DEPARTMENT WITHIN ONE WEEK IF SPECIATION AND SENSITIVITIES ARE REQUIRED. Performed at [facility], [address], [place], [place] [address]    Report Status 04/16/2020 FINAL  Final  Blood Culture ID Panel (Reflexed)     Status: Abnormal   Collection Time: 04/12/20 10:43 AM  Result Value Ref Range Status    Enterococcus faecalis NOT DETECTED NOT DETECTED Final   Enterococcus Faecium NOT DETECTED NOT DETECTED Final   Listeria monocytogenes NOT DETECTED NOT DETECTED Final   Staphylococcus species DETECTED (A) NOT DETECTED Final    Comment: CRITICAL RESULT CALLED TO, READ BACK BY AND VERIFIED WITH: JASON ROBBINS PHARMD 2153 04/13/20 HNM    Staphylococcus aureus (BCID) NOT DETECTED NOT DETECTED Final   Staphylococcus epidermidis NOT DETECTED NOT DETECTED Final   Staphylococcus lugdunensis NOT DETECTED NOT DETECTED Final   Streptococcus species NOT DETECTED NOT DETECTED Final   Streptococcus agalactiae NOT DETECTED NOT DETECTED Final   Streptococcus pneumoniae NOT DETECTED NOT DETECTED Final   Streptococcus pyogenes NOT DETECTED NOT DETECTED Final   A.calcoaceticus-baumannii NOT DETECTED NOT DETECTED Final  Bacteroides fragilis NOT DETECTED NOT DETECTED Final   Enterobacterales NOT DETECTED NOT DETECTED Final   Enterobacter cloacae complex NOT DETECTED NOT DETECTED Final   Escherichia coli NOT DETECTED NOT DETECTED Final   Klebsiella aerogenes NOT DETECTED NOT DETECTED Final   Klebsiella oxytoca NOT DETECTED NOT DETECTED Final   Klebsiella pneumoniae NOT DETECTED NOT DETECTED Final   Proteus species NOT DETECTED NOT DETECTED Final   Salmonella species NOT DETECTED NOT DETECTED Final   Serratia marcescens NOT DETECTED NOT DETECTED Final   Haemophilus influenzae NOT DETECTED NOT DETECTED Final   Neisseria meningitidis NOT DETECTED NOT DETECTED Final   Pseudomonas aeruginosa NOT DETECTED NOT DETECTED Final   Stenotrophomonas maltophilia NOT DETECTED NOT DETECTED Final   Candida albicans NOT DETECTED NOT DETECTED Final   Candida auris NOT DETECTED NOT DETECTED Final   Candida glabrata NOT DETECTED NOT DETECTED Final   Candida krusei NOT DETECTED NOT DETECTED Final   Candida parapsilosis NOT DETECTED NOT DETECTED Final   Candida tropicalis NOT DETECTED NOT DETECTED Final   Cryptococcus  neoformans/gattii NOT DETECTED NOT DETECTED Final    Comment: Performed at [facility], [address] [place], [place] [address]  Culture, blood (Routine X 2) w Reflex to ID Panel     Status: None   Collection Time: 04/12/20 12:47 PM   Specimen: BLOOD  Result Value Ref Range Status   Specimen Description BLOOD BLOOD LEFT WRIST  Final   Special Requests   Final    BOTTLES DRAWN AEROBIC AND ANAEROBIC Blood Culture adequate volume   Culture   Final    NO GROWTH 5 DAYS Performed at [facility], [address] [place], [place] [address]    Report Status 04/17/2020 FINAL  Final  Culture, blood (Routine X 2) w Reflex to ID Panel     Status: None   Collection Time: 04/13/20  2:34 PM   Specimen: BLOOD  Result Value Ref Range Status   Specimen Description BLOOD BLOOD LEFT HAND  Final   Special Requests   Final    BOTTLES DRAWN AEROBIC AND ANAEROBIC Blood Culture adequate volume   Culture   Final    NO GROWTH 5 DAYS Performed at [facility], [address] [place], [place] [address]    Report Status 04/18/2020 FINAL  Final  Culture, blood (Routine X 2) w Reflex to ID Panel     Status: None   Collection Time: 04/13/20  4:18 PM   Specimen: BLOOD  Result Value Ref Range Status   Specimen Description BLOOD BLOOD LEFT HAND  Final   Special Requests   Final    BOTTLES DRAWN AEROBIC ONLY Blood Culture adequate volume   Culture   Final    NO GROWTH 5 DAYS Performed at [facility], [address], [place], [place] [address]    Report Status 04/18/2020 FINAL  Final  Urine Culture     Status: None   Collection Time: 04/15/20  6:00 AM   Specimen: Urine, Random  Result Value Ref Range Status   Specimen Description   Final    URINE, RANDOM Performed at [facility], [address], [place], [place] [address]    Special Requests   Final    NONE Performed at [facility], [address], [place], [place] [address]    Culture    Final    NO GROWTH Performed at [facility], [address], [place], [place] [address]  Report Status 04/17/2020 FINAL  Final  CULTURE, BLOOD (ROUTINE X 2) w Reflex to ID Panel     Status: None (Preliminary result)   Collection Time: 04/17/20  2:13 PM   Specimen: BLOOD  Result Value Ref Range Status   Specimen Description BLOOD LEFT ANTECUBITAL  Final   Special Requests   Final    BOTTLES DRAWN AEROBIC AND ANAEROBIC Blood Culture adequate volume   Culture   Final    NO GROWTH 3 DAYS Performed at [facility], [address] [place], [place] [address]    Report Status PENDING  Incomplete  CULTURE, BLOOD (ROUTINE X 2) w Reflex to ID Panel     Status: None (Preliminary result)   Collection Time: 04/17/20  3:26 PM   Specimen: BLOOD  Result Value Ref Range Status   Specimen Description BLOOD LEFT ANTECUBITAL  Final   Special Requests   Final    BOTTLES DRAWN AEROBIC AND ANAEROBIC Blood Culture adequate volume   Culture   Final    NO GROWTH 3 DAYS Performed at [facility], [address], [place], [place] [address]    Report Status PENDING  Incomplete    Labs: BNP (last 3 results) No results for input(s): BNP in the last 8760 hours. Basic Metabolic Panel: Recent Labs  Lab 04/15/20 0514 04/15/20 0514 04/17/20 0521 04/17/20 1413 04/18/20 0401 04/19/20 0848 04/20/20 0748  NA 133*  --   --  129* 130* 130* 131*  K 3.4*  --   --  4.2 3.9 4.1 3.9  CL 98  --   --  95* 96* 96* 96*  CO2 27  --   --  26 24 23 24   GLUCOSE 138*  --   --  138* 149* 116* 118*  BUN 11  --   --  9 10 10 10   CREATININE 0.80   < > 0.59 0.63 0.76 0.59 0.69  CALCIUM 8.3*  --   --  8.6* 8.5* 8.5* 8.6*  MG  --   --   --   --   --  2.4 2.3  PHOS  --   --   --   --   --  2.5 2.3*   < > = values in this interval not displayed.   Liver Function Tests: Recent Labs  Lab 04/19/20 0848 04/20/20 0748  AST 33 34  ALT 31 31  ALKPHOS 100 101  BILITOT 1.0 1.0  PROT 6.7 6.7   ALBUMIN 2.8* 2.8*   No results for input(s): LIPASE, AMYLASE in the last 168 hours. No results for input(s): AMMONIA in the last 168 hours. CBC: Recent Labs  Lab 04/16/20 0344 04/17/20 1413 04/18/20 0401 04/19/20 0848 04/20/20 0748  WBC 7.3 7.7 7.0 7.5 6.2  NEUTROABS  --   --   --  6.2 4.8  HGB 12.1 12.6 12.6 11.4* 12.5  HCT 36.1 37.3 37.0 34.1* 36.3  MCV 103.4* 102.8* 102.5* 104.0* 99.5  PLT 242 308 275 348 336   Cardiac Enzymes: No results for input(s): CKTOTAL, CKMB, CKMBINDEX, TROPONINI in the last 168 hours. BNP: Invalid input(s): POCBNP CBG: Recent Labs  Lab 04/15/20 0820 04/15/20 2138 04/17/20 2010  GLUCAP 93 148* 96   D-Dimer No results for input(s): DDIMER in the last 72 hours. Hgb A1c No results for input(s): HGBA1C in the last 72 hours. Lipid Profile No results for input(s): CHOL, HDL, LDLCALC, TRIG, CHOLHDL, LDLDIRECT in the last 72 hours. Thyroid function studies No  results for input(s): TSH, T4TOTAL, T3FREE, THYROIDAB in the last 72 hours.  Invalid input(s): FREET3 Anemia work up No results for input(s): VITAMINB12, FOLATE, FERRITIN, TIBC, IRON, RETICCTPCT in the last 72 hours. Urinalysis No results found for: COLORURINE, APPEARANCEUR, LABSPEC, PHURINE, GLUCOSEU, HGBUR, BILIRUBINUR, KETONESUR, PROTEINUR, UROBILINOGEN, NITRITE, LEUKOCYTESUR Sepsis Labs Invalid input(s): PROCALCITONIN,  WBC,  LACTICIDVEN Microbiology Recent Results (from the past 240 hour(s))  Culture, blood (Routine X 2) w Reflex to ID Panel     Status: Abnormal   Collection Time: 04/12/20 10:43 AM   Specimen: BLOOD  Result Value Ref Range Status   Specimen Description   Final    BLOOD BACK OF LEFT HAND Performed at [facility], [address], [place], [place] [address]    Special Requests   Final    BOTTLES DRAWN AEROBIC AND ANAEROBIC Blood Culture adequate volume Performed at [facility], [address] [place], [place] [address]    Culture  Setup  Time   Final    Organism ID to follow GRAM POSITIVE COCCI ANAEROBIC BOTTLE ONLY CRITICAL RESULT CALLED TO, READ BACK BY AND VERIFIED WITH: [facility] 2153 04/13/20 HNM Performed at [facility], [address] [place], [place] [address]    Culture (A)  Final    STAPHYLOCOCCUS AURICULARIS THE SIGNIFICANCE OF ISOLATING THIS ORGANISM FROM A SINGLE SET OF BLOOD CULTURES WHEN MULTIPLE SETS ARE DRAWN IS UNCERTAIN. PLEASE NOTIFY THE MICROBIOLOGY DEPARTMENT WITHIN ONE WEEK IF SPECIATION AND SENSITIVITIES ARE REQUIRED. Performed at [facility], [address], [place], [place] [address]    Report Status 04/16/2020 FINAL  Final  Blood Culture ID Panel (Reflexed)     Status: Abnormal   Collection Time: 04/12/20 10:43 AM  Result Value Ref Range Status   Enterococcus faecalis NOT DETECTED NOT DETECTED Final   Enterococcus Faecium NOT DETECTED NOT DETECTED Final   Listeria monocytogenes NOT DETECTED NOT DETECTED Final   Staphylococcus species DETECTED (A) NOT DETECTED Final    Comment: CRITICAL RESULT CALLED TO, READ BACK BY AND VERIFIED WITH: JASON ROBBINS PHARMD 2153 04/13/20 HNM    Staphylococcus aureus (BCID) NOT DETECTED NOT DETECTED Final   Staphylococcus epidermidis NOT DETECTED NOT DETECTED Final   Staphylococcus lugdunensis NOT DETECTED NOT DETECTED Final   Streptococcus species NOT DETECTED NOT DETECTED Final   Streptococcus agalactiae NOT DETECTED NOT DETECTED Final   Streptococcus pneumoniae NOT DETECTED NOT DETECTED Final   Streptococcus pyogenes NOT DETECTED NOT DETECTED Final   A.calcoaceticus-baumannii NOT DETECTED NOT DETECTED Final   Bacteroides fragilis NOT DETECTED NOT DETECTED Final   Enterobacterales NOT DETECTED NOT DETECTED Final   Enterobacter cloacae complex NOT DETECTED NOT DETECTED Final   Escherichia coli NOT DETECTED NOT DETECTED Final   Klebsiella aerogenes NOT DETECTED NOT DETECTED Final   Klebsiella oxytoca NOT DETECTED NOT DETECTED Final    Klebsiella pneumoniae NOT DETECTED NOT DETECTED Final   Proteus species NOT DETECTED NOT DETECTED Final   Salmonella species NOT DETECTED NOT DETECTED Final   Serratia marcescens NOT DETECTED NOT DETECTED Final   Haemophilus influenzae NOT DETECTED NOT DETECTED Final   Neisseria meningitidis NOT DETECTED NOT DETECTED Final   Pseudomonas aeruginosa NOT DETECTED NOT DETECTED Final   Stenotrophomonas maltophilia NOT DETECTED NOT DETECTED Final   Candida albicans NOT DETECTED NOT DETECTED Final   Candida auris NOT DETECTED NOT DETECTED Final   Candida glabrata NOT DETECTED NOT DETECTED Final   Candida krusei NOT DETECTED NOT DETECTED Final  Candida parapsilosis NOT DETECTED NOT DETECTED Final   Candida tropicalis NOT DETECTED NOT DETECTED Final   Cryptococcus neoformans/gattii NOT DETECTED NOT DETECTED Final    Comment: Performed at [facility], [address] [place], [place] [address]  Culture, blood (Routine X 2) w Reflex to ID Panel     Status: None   Collection Time: 04/12/20 12:47 PM   Specimen: BLOOD  Result Value Ref Range Status   Specimen Description BLOOD BLOOD LEFT WRIST  Final   Special Requests   Final    BOTTLES DRAWN AEROBIC AND ANAEROBIC Blood Culture adequate volume   Culture   Final    NO GROWTH 5 DAYS Performed at [facility], [address] [place], [place] [address]    Report Status 04/17/2020 FINAL  Final  Culture, blood (Routine X 2) w Reflex to ID Panel     Status: None   Collection Time: 04/13/20  2:34 PM   Specimen: BLOOD  Result Value Ref Range Status   Specimen Description BLOOD BLOOD LEFT HAND  Final   Special Requests   Final    BOTTLES DRAWN AEROBIC AND ANAEROBIC Blood Culture adequate volume   Culture   Final    NO GROWTH 5 DAYS Performed at [facility], [address] [place], [place] [address]    Report Status 04/18/2020 FINAL  Final  Culture, blood (Routine X 2) w Reflex to ID Panel     Status: None    Collection Time: 04/13/20  4:18 PM   Specimen: BLOOD  Result Value Ref Range Status   Specimen Description BLOOD BLOOD LEFT HAND  Final   Special Requests   Final    BOTTLES DRAWN AEROBIC ONLY Blood Culture adequate volume   Culture   Final    NO GROWTH 5 DAYS Performed at [facility], [address], [place], [place] [address]    Report Status 04/18/2020 FINAL  Final  Urine Culture     Status: None   Collection Time: 04/15/20  6:00 AM   Specimen: Urine, Random  Result Value Ref Range Status   Specimen Description   Final    URINE, RANDOM Performed at [facility], [address] [place], [place] [address]    Special Requests   Final    NONE Performed at [facility], [address] [place], [place] [address]    Culture   Final    NO GROWTH Performed at [facility], [address], [place], [place] [address]    Report Status 04/17/2020 FINAL  Final  CULTURE, BLOOD (ROUTINE X 2) w Reflex to ID Panel     Status: None (Preliminary result)   Collection Time: 04/17/20  2:13 PM   Specimen: BLOOD  Result Value Ref Range Status   Specimen Description BLOOD LEFT ANTECUBITAL  Final   Special Requests   Final    BOTTLES DRAWN AEROBIC AND ANAEROBIC Blood Culture adequate volume   Culture   Final    NO GROWTH 3 DAYS Performed at [facility], [address], [place], [place] [address]    Report Status PENDING  Incomplete  CULTURE, BLOOD (ROUTINE X 2) w Reflex to ID Panel     Status: None (Preliminary result)   Collection Time: 04/17/20  3:26 PM   Specimen: BLOOD  Result Value Ref Range Status   Specimen Description BLOOD LEFT ANTECUBITAL  Final   Special Requests   Final    BOTTLES DRAWN  AEROBIC AND ANAEROBIC Blood Culture adequate volume   Culture   Final    NO GROWTH 3 DAYS Performed at [facility], [address] [place], [place] [address]    Report Status PENDING  Incomplete   Time coordinating discharge: 35  minutes  SIGNED:  Merlene Laughter, DO [facility] 04/20/2020, 2:32 PM Pager is on AMION  If 7PM-7AM, please contact night-coverage [email or web address]

## 2020-04-20 NOTE — Telephone Encounter (Signed)
Monitor placed 04/20/20 at 1435.

## 2020-04-20 NOTE — TOC Progression Note (Signed)
Transition of Care [facility]) - Progression Note    Patient Details  Name: Stephanie Mason MRN: [id number] Date of Birth: [birth year]  Transition of Care [facility]) CM/SW Contact  Barrie Dunker, RN Phone Number: 04/20/2020, 4:22 PM  Clinical Narrative:   Called [facility] to transport, the patient will be picked p at 6, Bedside nurse aware    Expected Discharge Plan: Home w Home Health Services Barriers to Discharge: Continued Medical Work up  Expected Discharge Plan and Services Expected Discharge Plan: Home w Home Health Services   Discharge Planning Services: CM Consult Post Acute Care Choice: Home Health Living arrangements for the past 2 months: Single Family Home Expected Discharge Date: 04/20/20                                     Social Determinants of Health (SDOH) Interventions    Readmission Risk Interventions No flowsheet data found.

## 2020-04-20 NOTE — Care Management Important Message (Signed)
Important Message  Patient Details  Name: Stephanie Mason MRN: [id number] Date of Birth: [birth year]   Medicare Important Message Given:  Yes     Johnell Comings 04/20/2020, 2:47 PM

## 2020-04-20 NOTE — Progress Notes (Signed)
Pt ready for discharge to SNF per MD. Pt assessment unchanged from this morning. Report called to Morrie Sheldon at [facility]; all questions answered and discharge instructions reviewed. EMS transportation set up for pt by RN. PIV removed, VSS. Pt belongings packed including cell phone, glasses, and live tele monitor box.    Stephanie Mason

## 2020-04-22 LAB — CULTURE, BLOOD (ROUTINE X 2)
Culture: NO GROWTH
Culture: NO GROWTH
Special Requests: ADEQUATE
Special Requests: ADEQUATE

## 2020-04-22 LAB — RNA QUALITATIVE: HIV 1 RNA Qualitative: 1

## 2020-04-22 LAB — HIV-1/2 AB - DIFFERENTIATION
HIV 1 Ab: NEGATIVE
HIV 2 Ab: NEGATIVE
Note: NEGATIVE

## 2020-04-28 ENCOUNTER — Telehealth: Payer: Self-pay | Admitting: Physician Assistant

## 2020-04-28 NOTE — Telephone Encounter (Signed)
Spoke with Public relations account executive at [facility].  Darlene sts that the patient has completed the wearing her zio monitor and they need to know how to return it.   Darlene sts that they were not given a prepaid postage box. Adv her that I could mail them one. Once received they would place the monitor in the box and send it back to [employer] via [place] postal service.  Darlene verbalized understanding and voiced appreciation for the assistance.  Zio return box mailed to [employer] Attn: Agustin Cree [facility]) [address] [place], [place] [address]

## 2020-04-28 NOTE — Telephone Encounter (Signed)
Please call to discuss how and where patient should return monitor.

## 2020-05-01 ENCOUNTER — Telehealth: Payer: Self-pay | Admitting: Cardiology

## 2020-05-01 NOTE — Telephone Encounter (Signed)
Reference # [id number]. Calling to states that monitor is not properly transmitting, unable to reach patient to confirm if patient is still wearing. Please call to discuss.

## 2020-05-02 NOTE — Telephone Encounter (Signed)
Called [employer] and informed them the patient removed the monitor and is awaiting a second box that we mailed to her to return the monitor in.

## 2020-05-17 NOTE — Telephone Encounter (Signed)
Spoke with patients PCP from [facility] Dr. Selmer Dominion. I faxed her the ZIO report as requested to [phone]. Dr. Arta Silence inquired if we could order another Zio monitor as patient only wore this one for 6 days and it was a benign result. She stated that the patient seems to be running tachycardiac more often since taking the monitor off.

## 2020-05-17 NOTE — Telephone Encounter (Signed)
[facility] calling to fu on zio results.    Please call provider to discuss.

## 2020-05-18 ENCOUNTER — Other Ambulatory Visit: Payer: Self-pay | Admitting: *Deleted

## 2020-05-18 DIAGNOSIS — I639 Cerebral infarction, unspecified: Secondary | ICD-10-CM

## 2020-05-18 NOTE — Telephone Encounter (Signed)
Called and left a VM with Dr. Selmer Dominion, informing her that patient would have to have a scheduled appointment to have another Zio placed per Eula Listen, PA.  Encouraged her to call back with any questions or concerns.

## 2020-05-31 ENCOUNTER — Other Ambulatory Visit: Payer: Self-pay | Admitting: Family Medicine

## 2020-05-31 DIAGNOSIS — Z8673 Personal history of transient ischemic attack (TIA), and cerebral infarction without residual deficits: Secondary | ICD-10-CM

## 2020-05-31 DIAGNOSIS — R1312 Dysphagia, oropharyngeal phase: Secondary | ICD-10-CM

## 2020-06-07 ENCOUNTER — Other Ambulatory Visit: Payer: Self-pay

## 2020-06-07 ENCOUNTER — Ambulatory Visit
Admission: RE | Admit: 2020-06-07 | Discharge: 2020-06-07 | Disposition: A | Discharge status: Home / Self Care | Payer: Medicare (Managed Care) | Source: Ambulatory Visit | Attending: Family Medicine | Admitting: Family Medicine

## 2020-06-07 DIAGNOSIS — Z8673 Personal history of transient ischemic attack (TIA), and cerebral infarction without residual deficits: Secondary | ICD-10-CM | POA: Diagnosis present

## 2020-06-07 DIAGNOSIS — R1310 Dysphagia, unspecified: Secondary | ICD-10-CM | POA: Insufficient documentation

## 2020-06-07 NOTE — Therapy (Signed)
[facility] DIAGNOSTIC RADIOLOGY [address] [place], [place], [address] Phone: [phone]   Fax:     Modified Barium Swallow  Patient Details  Name: Stephanie Mason MRN: [id number] Date of Birth: 1950/10/04 No data recorded  Encounter Date: 06/07/2020   End of Session - 06/07/20 1506    Visit Number 1    Number of Visits 1    Date for SLP Re-Evaluation 06/07/20    SLP Start Time 1300    SLP Stop Time  1345    SLP Time Calculation (min) 45 min    Activity Tolerance Patient tolerated treatment well           Past Medical History:  Diagnosis Date  . Diabetes mellitus without complication (HCC)   . High cholesterol   . Hypertension   . Left hemiparesis (HCC)   . Left-sided weakness    r/t to stroke 2014  . OSA on CPAP   . Seizures (HCC)   . Stroke (HCC) 2014  . Stroke [facility])     Past Surgical History:  Procedure Laterality Date  . TEE WITHOUT CARDIOVERSION N/A 09/22/2012   Procedure: TRANSESOPHAGEAL ECHOCARDIOGRAM (TEE);  Surgeon: [name], MD;  Location: [facility] ENDOSCOPY;  Service: Cardiovascular;  Laterality: N/A;  . TEE WITHOUT CARDIOVERSION N/A 04/14/2020   Procedure: TRANSESOPHAGEAL ECHOCARDIOGRAM (TEE);  Surgeon: Antonieta Iba, MD;  Location: [facility] ORS;  Service: Cardiovascular;  Laterality: N/A;    There were no vitals filed for this visit.    Subjective: Patient behavior: (alertness, ability to follow instructions, etc.): The patient is alert, able to express her swallowing concerns, and follow directions for this study.  Chief complaint: 69 year old woman with acute CVA 03/2020 and S/P 2 remote CVAs (20 years and 15 years ago).  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  Patient reports difficulty swallowing pills and no other swallowing problems or history of pulmonary illness.    Objective:  Radiological Procedure: A videoflouroscopic evaluation of oral-preparatory, reflex initiation, and  pharyngeal phases of the swallow was performed; as well as a screening of the upper esophageal phase.  I. POSTURE: Upright in MBS chair  II. VIEW: Lateral  III. COMPENSATORY STRATEGIES: N/A  IV. BOLUSES ADMINISTERED:   Thin Liquid: 1 small, 4 rapid consecutive   Nectar-thick Liquid: 1 moderate   Honey-thick Liquid: DNT   Puree: 1 teaspoon, 1 heaping teaspoon   Mechanical Soft: 1/4 graham cracker in applesauce   Barium tablet: Whole in applesauce  V. RESULTS OF EVALUATION: A. ORAL PREPARATORY PHASE: (The lips, tongue, and velum are observed for strength and coordination)       **Overall Severity Rating: within normal limits   B. SWALLOW INITIATION/REFLEX: (The reflex is normal if "triggered" by the time the bolus reached the base of the tongue)  **Overall Severity Rating: within normal limits   C. PHARYNGEAL PHASE: (Pharyngeal function is normal if the bolus shows rapid, smooth, and continuous transit through the pharynx and there is no pharyngeal residue after the swallow)  **Overall Severity Rating: within normal limits   D. LARYNGEAL PENETRATION: (Material entering into the laryngeal inlet/vestibule but not aspirated) None  E. ASPIRATION: None  F. ESOPHAGEAL PHASE: (Screening of the upper esophagus) No overt abnormality within the viewable cervical esophagus  ASSESSMENT: This 69 year old woman; with history of multiple CVAs; is presenting with functional oropharyngeal swallowing.  With exception of prolonged posterior transfer of large boluses and barium tablet, oral control of the bolus including oral hold,  rotary mastication, and anterior to posterior transfer is within functional limits.   Timing of pharyngeal swallowing initiation is within normal limits. Aspects of the pharyngeal stage of swallowing including tongue base retraction, hyolaryngeal excursion, epiglottic inversion, and duration/amplitude of UES opening are within normal limits.  There is no observed  pharyngeal residue, laryngeal penetration, or tracheal aspiration.  The patient is not at risk for prandial aspiration.  The prolonged posterior transfer appears to be due to fear that it will be problematic to swallow rather than neuromuscular dysphagia.  Patient was advised to relax and take as much time as she needs to swallow medication and manage her foods.  PLAN/RECOMMENDATIONS:   A. Diet: Regular- soften and moisten as needed for easier chewing   B. Swallowing Precautions: No special precautions indicated by this study   C. Recommended consultation to: N/A    D. Therapy recommendations: speech therapy is not indicated   E. Results and recommendations were discussed with the patient immediately following the study and the final report routed to the referring MD.   Dysphagia, unspecified type - Plan: DG SWALLOW FUNC OP MEDICARE SPEECH PATH, DG SWALLOW FUNC OP MEDICARE SPEECH PATH  Status post CVA - Plan: DG SWALLOW FUNC OP MEDICARE SPEECH PATH, DG SWALLOW FUNC OP MEDICARE SPEECH PATH        Problem List Patient Active Problem List   Diagnosis Date Noted  . Acute CVA (cerebrovascular accident) (HCC) 04/10/2020  . Stroke (cerebrum) (HCC) 04/10/2020  . OSA on CPAP   . Left hand pain 10/10/2012  . CVA (cerebral infarction) 09/22/2012  . Cytotoxic cerebral edema (HCC) 09/16/2012  . Hemiplegia, unspecified, affecting nondominant side 09/15/2012  . Cerebral embolism with cerebral infarction (HCC) 09/15/2012  . Other convulsions 09/15/2012   Dollene Primrose, MS/CCC- SLP  Leandrew Koyanagi 06/07/2020, 3:07 PM  [facility] DIAGNOSTIC RADIOLOGY [address] [place], [place], [address] Phone: [phone]   Fax:     Name: Stephanie Mason MRN: [id number] Date of Birth: Dec 06, 1950

## 2020-06-08 ENCOUNTER — Ambulatory Visit: Payer: Medicare Other

## 2021-01-18 ENCOUNTER — Other Ambulatory Visit: Payer: Self-pay | Admitting: Family Medicine

## 2021-01-18 DIAGNOSIS — E559 Vitamin D deficiency, unspecified: Secondary | ICD-10-CM

## 2021-02-21 ENCOUNTER — Other Ambulatory Visit: Payer: Self-pay

## 2021-02-21 ENCOUNTER — Ambulatory Visit
Admission: RE | Admit: 2021-02-21 | Discharge: 2021-02-21 | Disposition: A | Discharge status: Home / Self Care | Payer: Medicare (Managed Care) | Source: Ambulatory Visit | Attending: Family Medicine | Admitting: Family Medicine

## 2021-02-21 DIAGNOSIS — E559 Vitamin D deficiency, unspecified: Secondary | ICD-10-CM

## 2021-02-21 DIAGNOSIS — M85852 Other specified disorders of bone density and structure, left thigh: Secondary | ICD-10-CM | POA: Insufficient documentation

## 2021-02-21 DIAGNOSIS — Z13820 Encounter for screening for osteoporosis: Secondary | ICD-10-CM | POA: Diagnosis not present

## 2021-02-21 DIAGNOSIS — Z780 Asymptomatic menopausal state: Secondary | ICD-10-CM | POA: Insufficient documentation

## 2021-12-21 ENCOUNTER — Other Ambulatory Visit: Payer: Self-pay | Admitting: Family Medicine

## 2021-12-21 DIAGNOSIS — Z1231 Encounter for screening mammogram for malignant neoplasm of breast: Secondary | ICD-10-CM

## 2022-01-23 ENCOUNTER — Ambulatory Visit
Admission: RE | Admit: 2022-01-23 | Discharge: 2022-01-23 | Disposition: A | Discharge status: Home / Self Care | Payer: Medicare (Managed Care) | Source: Ambulatory Visit | Attending: Family Medicine | Admitting: Family Medicine

## 2022-01-23 DIAGNOSIS — Z1231 Encounter for screening mammogram for malignant neoplasm of breast: Secondary | ICD-10-CM | POA: Insufficient documentation

## 2022-05-20 ENCOUNTER — Encounter: Payer: Self-pay | Admitting: Ophthalmology

## 2022-05-21 NOTE — Discharge Instructions (Signed)

## 2022-05-23 ENCOUNTER — Encounter: Payer: Self-pay | Admitting: Ophthalmology

## 2022-05-23 ENCOUNTER — Ambulatory Visit: Payer: Medicare Other | Admitting: Anesthesiology

## 2022-05-23 ENCOUNTER — Other Ambulatory Visit: Payer: Self-pay

## 2022-05-23 ENCOUNTER — Ambulatory Visit
Admission: RE | Admit: 2022-05-23 | Discharge: 2022-05-23 | Disposition: A | Discharge status: Home / Self Care | Payer: Medicare Other | Source: Ambulatory Visit | Attending: Ophthalmology | Admitting: Ophthalmology

## 2022-05-23 ENCOUNTER — Encounter
Admission: RE | Disposition: A | Discharge status: Home / Self Care | Payer: Self-pay | Source: Ambulatory Visit | Attending: Ophthalmology

## 2022-05-23 DIAGNOSIS — E1136 Type 2 diabetes mellitus with diabetic cataract: Secondary | ICD-10-CM | POA: Diagnosis present

## 2022-05-23 DIAGNOSIS — Z7984 Long term (current) use of oral hypoglycemic drugs: Secondary | ICD-10-CM | POA: Diagnosis not present

## 2022-05-23 DIAGNOSIS — Z87891 Personal history of nicotine dependence: Secondary | ICD-10-CM | POA: Diagnosis not present

## 2022-05-23 DIAGNOSIS — H2512 Age-related nuclear cataract, left eye: Secondary | ICD-10-CM | POA: Diagnosis not present

## 2022-05-23 DIAGNOSIS — I69354 Hemiplegia and hemiparesis following cerebral infarction affecting left non-dominant side: Secondary | ICD-10-CM | POA: Insufficient documentation

## 2022-05-23 DIAGNOSIS — G4730 Sleep apnea, unspecified: Secondary | ICD-10-CM | POA: Insufficient documentation

## 2022-05-23 DIAGNOSIS — Z7902 Long term (current) use of antithrombotics/antiplatelets: Secondary | ICD-10-CM | POA: Diagnosis not present

## 2022-05-23 DIAGNOSIS — I10 Essential (primary) hypertension: Secondary | ICD-10-CM | POA: Diagnosis not present

## 2022-05-23 DIAGNOSIS — K219 Gastro-esophageal reflux disease without esophagitis: Secondary | ICD-10-CM | POA: Insufficient documentation

## 2022-05-23 DIAGNOSIS — Z86718 Personal history of other venous thrombosis and embolism: Secondary | ICD-10-CM | POA: Diagnosis not present

## 2022-05-23 HISTORY — DX: Polyneuropathy, unspecified: G62.9

## 2022-05-23 HISTORY — DX: Gastro-esophageal reflux disease without esophagitis: K21.9

## 2022-05-23 HISTORY — DX: Dependence on wheelchair: Z99.3

## 2022-05-23 HISTORY — DX: Presence of dental prosthetic device (complete) (partial): Z97.2

## 2022-05-23 HISTORY — PX: CATARACT EXTRACTION W/PHACO: SHX586

## 2022-05-23 LAB — GLUCOSE, CAPILLARY
Glucose-Capillary: 124 mg/dL — ABNORMAL HIGH (ref 70–99)
Glucose-Capillary: 126 mg/dL — ABNORMAL HIGH (ref 70–99)

## 2022-05-23 SURGERY — PHACOEMULSIFICATION, CATARACT, WITH IOL INSERTION
Anesthesia: Monitor Anesthesia Care | Site: Eye | Laterality: Left

## 2022-05-23 MED ORDER — BRIMONIDINE TARTRATE-TIMOLOL 0.2-0.5 % OP SOLN
OPHTHALMIC | Status: DC | PRN
  Administered 2022-05-23: 1 [drp] via OPHTHALMIC

## 2022-05-23 MED ORDER — SIGHTPATH DOSE#1 NA HYALUR & NA CHOND-NA HYALUR IO KIT
PACK | INTRAOCULAR | Status: DC | PRN
  Administered 2022-05-23: 1 via OPHTHALMIC

## 2022-05-23 MED ORDER — SIGHTPATH DOSE#1 BSS IO SOLN
INTRAOCULAR | Status: DC | PRN
  Administered 2022-05-23: 60 mL via OPHTHALMIC

## 2022-05-23 MED ORDER — LIDOCAINE HCL (PF) 2 % IJ SOLN
INTRAOCULAR | Status: DC | PRN
  Administered 2022-05-23: 1 mL via INTRAOCULAR

## 2022-05-23 MED ORDER — TETRACAINE HCL 0.5 % OP SOLN
1.0000 [drp] | OPHTHALMIC | Status: DC | PRN
  Administered 2022-05-23 (×3): 1 [drp] via OPHTHALMIC

## 2022-05-23 MED ORDER — MIDAZOLAM HCL 2 MG/2ML IJ SOLN
INTRAMUSCULAR | Status: DC | PRN
  Administered 2022-05-23 (×2): 1 mg via INTRAVENOUS

## 2022-05-23 MED ORDER — MOXIFLOXACIN HCL 0.5 % OP SOLN
OPHTHALMIC | Status: DC | PRN
  Administered 2022-05-23: 0.2 mL via OPHTHALMIC

## 2022-05-23 MED ORDER — ARMC OPHTHALMIC DILATING DROPS
1.0000 | OPHTHALMIC | Status: DC | PRN
  Administered 2022-05-23 (×3): 1 via OPHTHALMIC

## 2022-05-23 MED ORDER — SIGHTPATH DOSE#1 BSS IO SOLN
INTRAOCULAR | Status: DC | PRN
  Administered 2022-05-23: 15 mL

## 2022-05-23 SURGICAL SUPPLY — 22 items
CANNULA ANT/CHMB 27G (MISCELLANEOUS) IMPLANT
CANNULA ANT/CHMB 27GA (MISCELLANEOUS) IMPLANT
CATARACT SUITE SIGHTPATH (MISCELLANEOUS) ×1 IMPLANT
DISSECTOR HYDRO NUCLEUS 50X22 (MISCELLANEOUS) ×1 IMPLANT
DRSG TEGADERM 2-3/8X2-3/4 SM (GAUZE/BANDAGES/DRESSINGS) ×1 IMPLANT
FEE CATARACT SUITE SIGHTPATH (MISCELLANEOUS) ×1 IMPLANT
GLOVE SURG SYN 7.5  E (GLOVE) ×1
GLOVE SURG SYN 7.5 E (GLOVE) ×1 IMPLANT
GLOVE SURG SYN 7.5 PF PI (GLOVE) ×1 IMPLANT
GLOVE SURG SYN 8.5  E (GLOVE) ×1
GLOVE SURG SYN 8.5 E (GLOVE) ×1 IMPLANT
GLOVE SURG SYN 8.5 PF PI (GLOVE) ×1 IMPLANT
LENS IOL TECNIS EYHANCE 11.5 (Intraocular Lens) IMPLANT
NDL FILTER BLUNT 18X1 1/2 (NEEDLE) IMPLANT
NEEDLE FILTER BLUNT 18X1 1/2 (NEEDLE) IMPLANT
PACK VIT ANT 23G (MISCELLANEOUS) IMPLANT
RING MALYGIN (MISCELLANEOUS) IMPLANT
SUT ETHILON 10-0 CS-B-6CS-B-6 (SUTURE)
SUTURE EHLN 10-0 CS-B-6CS-B-6 (SUTURE) IMPLANT
SYR 3ML LL SCALE MARK (SYRINGE) IMPLANT
SYR 5ML LL (SYRINGE) IMPLANT
WATER STERILE IRR 250ML POUR (IV SOLUTION) ×1 IMPLANT

## 2022-05-23 NOTE — Anesthesia Postprocedure Evaluation (Signed)
Anesthesia Post Note  Patient: Jacki Cranfield  Procedure(s) Performed: CATARACT EXTRACTION PHACO AND INTRAOCULAR LENS PLACEMENT (IOC) LEFT (Left: Eye)  Patient location during evaluation: PACU Anesthesia Type: MAC Level of consciousness: awake and alert, oriented and patient cooperative Pain management: pain level controlled Vital Signs Assessment: post-procedure vital signs reviewed and stable Respiratory status: spontaneous breathing, nonlabored ventilation and respiratory function stable Cardiovascular status: blood pressure returned to baseline and stable Postop Assessment: adequate PO intake Anesthetic complications: no   No notable events documented.   Last Vitals:  Vitals:   05/23/22 0837 05/23/22 0845  BP: 130/70 125/72  Pulse: 63 65  Resp: 18 16  Temp: 36.7 C   SpO2: 100% 100%    Last Pain:  Vitals:   05/23/22 0845  TempSrc:   PainSc: 0-No pain                 Darrin Nipper

## 2022-05-23 NOTE — H&P (Signed)
[facility]   Primary Care Physician:  [facility] Ophthalmologist: Dr. Deberah Pelton  Pre-Procedure History & Physical: HPI:  Jacklynn Dehaas is a 71 y.o. female here for cataract surgery.   Past Medical History:  Diagnosis Date   Diabetes mellitus without complication (HCC)    DVT (deep venous thrombosis) (HCC) 2018   left thigh   GERD (gastroesophageal reflux disease)    High cholesterol    Hypertension    Left hemiparesis (HCC)    Left-sided weakness    r/t to stroke 2014   Neuropathy    left leg   OSA on CPAP    Seizures (HCC)    Stroke (HCC) 08/12/2012   also age 43 and 2018 or 2019   Stroke [facility])    Uses wheelchair    Able to stand and pivot   Wears dentures    full upper and lower    Past Surgical History:  Procedure Laterality Date   TEE WITHOUT CARDIOVERSION N/A 09/22/2012   Procedure: TRANSESOPHAGEAL ECHOCARDIOGRAM (TEE);  Surgeon: [name], MD;  Location: [facility] ENDOSCOPY;  Service: Cardiovascular;  Laterality: N/A;   TEE WITHOUT CARDIOVERSION N/A 04/14/2020   Procedure: TRANSESOPHAGEAL ECHOCARDIOGRAM (TEE);  Surgeon: Antonieta Iba, MD;  Location: [facility] ORS;  Service: Cardiovascular;  Laterality: N/A;    Prior to Admission medications   Medication Sig Start Date End Date Taking? Authorizing Provider  alum & mag hydroxide-simeth (MAALOX/MYLANTA) 200-200-20 MG/5ML suspension Take by mouth 3 (three) times daily as needed for indigestion or heartburn.   Yes [provider]  amLODipine (NORVASC) 5 MG tablet Take 5 mg by mouth at bedtime.   Yes [provider]  ASPERCREME LIDOCAINE EX Apply topically 4 (four) times daily as needed.   Yes [provider]  ASPIRIN 81 PO Take by mouth daily.   Yes [provider]  calcium carbonate (TUMS - DOSED IN MG ELEMENTAL CALCIUM) 500 MG chewable tablet Chew 1 tablet by mouth 3 (three) times daily as needed for indigestion or heartburn.   Yes [provider]  camphor-menthol Wynelle Fanny) lotion Apply 1 Application topically as needed for itching.   Yes [provider]  carboxymethylcellul-glycerin (OPTIVE) 0.5-0.9 % ophthalmic solution Place 1 drop into both eyes 4 (four) times daily as needed for dry eyes.   Yes [provider]  cholecalciferol (VITAMIN D3) 25 MCG (1000 UNIT) tablet Take 1,000 Units by mouth daily.   Yes [provider]  clopidogrel (PLAVIX) 75 MG tablet Take 1 tablet (75 mg total) by mouth daily. 04/20/20  Yes Sheikh, Omair Latif, DO  escitalopram (LEXAPRO) 5 MG tablet Take 5 mg by mouth daily.   Yes [provider]  gabapentin (NEURONTIN) 400 MG capsule Take 400 mg by mouth at bedtime.   Yes [provider]  metFORMIN (GLUCOPHAGE-XR) 500 MG 24 hr tablet Take 500 mg by mouth daily with breakfast.   Yes [provider]  metoprolol succinate (TOPROL-XL) 25 MG 24 hr tablet Take 12.5 mg by mouth at bedtime.   Yes [provider]  mometasone (NASONEX) 50 MCG/ACT nasal spray Place 2 sprays into the nose daily as needed.   Yes [provider]  pantoprazole (PROTONIX) 20 MG tablet Take 20 mg by mouth daily.   Yes [provider]  polyethylene glycol (MIRALAX / GLYCOLAX) 17 g packet Take 17 g by mouth daily as needed.   Yes [provider]  senna-docusate (SENOKOT-S) 8.6-50 MG tablet Take 2 tablets  by mouth 2 (two) times daily. 04/20/20  Yes Sheikh, Omair Latif, DO  Zinc Oxide (DESITIN) 13 % CREA Apply topically as needed.   Yes [provider]  acetaminophen (TYLENOL) 325 MG tablet Take 325 mg by mouth daily as needed for pain. For pain    [provider]  acetaminophen (TYLENOL) 650 MG CR tablet Take 1,300 mg by mouth every 8 (eight) hours as needed for pain.    [provider]  bisacodyl (DULCOLAX) 10 MG suppository Place 1 suppository (10 mg total) rectally daily as needed for severe constipation. 04/20/20   Raiford Noble  Latif, DO  cyclobenzaprine (FLEXERIL) 5 MG tablet Take 1 tablet (5 mg total) by mouth 3 (three) times daily. 04/20/20   Sheikh, Omair Latif, DO  fluticasone (FLONASE) 50 MCG/ACT nasal spray Place 1 spray into the nose daily as needed. For nasal congestion    [provider]  losartan (COZAAR) 50 MG tablet Take 50 mg by mouth daily. Take every other day.    [provider]  metoCLOPramide (REGLAN) 10 MG tablet Take 1 tablet (10 mg total) by mouth every 8 (eight) hours as needed for up to 5 days for nausea (headache). 04/09/20 04/14/20  Arta Silence, MD  oxybutynin (DITROPAN-XL) 5 MG 24 hr tablet Take 5 mg by mouth daily.    [provider]  tizanidine (ZANAFLEX) 2 MG capsule Take 1 capsule (2 mg total) by mouth 3 (three) times daily. 12/28/12   Kirsteins, Luanna Salk, MD  traMADol (ULTRAM) 50 MG tablet Take 1 tablet (50 mg total) by mouth every 6 (six) hours as needed for pain. 02/18/13   Kirsteins, Luanna Salk, MD    Allergies as of 02/14/2022   (No Known Allergies)    Family History  Problem Relation Age of Onset   Stroke Mother    Cancer Father        esophageal   Coronary artery disease Other    Heart failure Brother    Breast cancer Maternal Aunt    Breast cancer Cousin     Social History   Socioeconomic History   Marital status: Single    Spouse name: Not on file   Number of children: 2   Years of education: 12   Highest education level: Not on file  Occupational History   Occupation: Forensic psychologist: [employer]  Tobacco Use   Smoking status: Former    Types: Cigarettes    Quit date: 2018    Years since quitting: 5.7   Smokeless tobacco: Current    Types: Snuff  Vaping Use   Vaping Use: Never used  Substance and Sexual Activity   Alcohol use: Never    Comment: h/o alcohol. stopped using in 2014   Drug use: Never   Sexual activity: Never  Other Topics Concern   Not on file  Social History Narrative   ** Merged History  Encounter **       Patient is single and lives with her significant other Scarlett Presto).   Patient has two children.   Patient is disabled.   Patient has a high school education.   Patient is right-handed.   Patient drinks one cup of coffee most days, and tea occasionally.   Social Determinants of Health   Financial Resource Strain: Not on file  Food Insecurity: Not on file  Transportation Needs: Not on file  Physical Activity: Not on file  Stress: Not on file  Social  Connections: Not on file  Intimate Partner Violence: Not on file    Review of Systems: See HPI, otherwise negative ROS  Physical Exam: Ht 5\' 7"  (1.702 m)   Wt 74.8 kg   BMI 25.84 kg/m  General:   Alert, cooperative in NAD Head:  Normocephalic and atraumatic. Respiratory:  Normal work of breathing. Cardiovascular:  RRR  Impression/Plan: Joei Creasman is here for cataract surgery.  Risks, benefits, limitations, and alternatives regarding cataract surgery have been reviewed with the patient.  Questions have been answered.  All parties agreeable.   , MD  05/23/2022, 7:12 AM

## 2022-05-23 NOTE — Anesthesia Preprocedure Evaluation (Addendum)
Anesthesia Evaluation  Patient identified by MRN, date of birth, ID band Patient awake    Reviewed: Allergy & Precautions, NPO status , Patient's Chart, lab work & pertinent test results  History of Anesthesia Complications Negative for: history of anesthetic complications  Airway Mallampati: IV   Neck ROM: Full    Dental  (+) Lower Dentures, Upper Dentures   Pulmonary sleep apnea and Continuous Positive Airway Pressure Ventilation , former smoker (quit 2018),    Pulmonary exam normal breath sounds clear to auscultation       Cardiovascular hypertension, Normal cardiovascular exam Rhythm:Regular Rate:Normal  Hx DVT on Plavix; palpitations   Neuro/Psych CVA (2014, 2021, residual left-sided weakness)    GI/Hepatic GERD  Medicated,  Endo/Other  diabetes, Type 2  Renal/GU negative Renal ROS     Musculoskeletal   Abdominal   Peds  Hematology negative hematology ROS (+)   Anesthesia Other Findings   Reproductive/Obstetrics                           Anesthesia Physical Anesthesia Plan  ASA: 3  Anesthesia Plan: MAC   Post-op Pain Management:    Induction: Intravenous  PONV Risk Score and Plan: 2 and Treatment may vary due to age or medical condition, Midazolam and TIVA  Airway Management Planned: Natural Airway and Nasal Cannula  Additional Equipment:   Intra-op Plan:   Post-operative Plan:   Informed Consent: I have reviewed the patients History and Physical, chart, labs and discussed the procedure including the risks, benefits and alternatives for the proposed anesthesia with the patient or authorized representative who has indicated his/her understanding and acceptance.     Dental advisory given  Plan Discussed with: CRNA  Anesthesia Plan Comments: (LMA/GETA backup discussed.  Patient consented for risks of anesthesia including but not limited to:  - adverse reactions to  medications - damage to eyes, teeth, lips or other oral mucosa - nerve damage due to positioning  - sore throat or hoarseness - damage to heart, brain, nerves, lungs, other parts of body or loss of life  Informed patient about role of CRNA in peri- and intra-operative care.  Patient voiced understanding.)        Anesthesia Quick Evaluation

## 2022-05-23 NOTE — Transfer of Care (Signed)
Immediate Anesthesia Transfer of Care Note  Patient: Stephanie Mason  Procedure(s) Performed: CATARACT EXTRACTION PHACO AND INTRAOCULAR LENS PLACEMENT (IOC) LEFT (Left: Eye)  Patient Location: PACU  Anesthesia Type: MAC  Level of Consciousness: awake, alert  and patient cooperative  Airway and Oxygen Therapy: Patient Spontanous Breathing and Patient connected to supplemental oxygen  Post-op Assessment: Post-op Vital signs reviewed, Patient's Cardiovascular Status Stable, Respiratory Function Stable, Patent Airway and No signs of Nausea or vomiting  Post-op Vital Signs: Reviewed and stable  Complications: No notable events documented.

## 2022-05-23 NOTE — Op Note (Signed)
OPERATIVE NOTE  Stephanie Mason [id number] 05/23/2022   PREOPERATIVE DIAGNOSIS: Nuclear sclerotic cataract left eye. H25.12   POSTOPERATIVE DIAGNOSIS: Nuclear sclerotic cataract left eye. H25.12   PROCEDURE:  Phacoemusification with posterior chamber intraocular lens placement of the left eye  Ultrasound time: Procedure(s) with comments: CATARACT EXTRACTION PHACO AND INTRAOCULAR LENS PLACEMENT (IOC) LEFT (Left) - 4.12 0:43.5  LENS:   Implant Name Type Inv. Item Serial No. Manufacturer Lot No. LRB No. Used Action  LENS IOL TECNIS EYHANCE 11.5 - [id number] Intraocular Lens LENS IOL TECNIS EYHANCE 11.5 [id number] [employer]  Left 1 Implanted      SURGEON:  Courtney Heys. Lazarus Salines, MD   ANESTHESIA:  Topical with tetracaine drops, augmented with 1% preservative-free intracameral lidocaine.   COMPLICATIONS:  None.   DESCRIPTION OF PROCEDURE:  The patient was identified in the holding room and transported to the operating room and placed in the supine position under the operating microscope.  The left eye was identified as the operative eye, which was prepped and draped in the usual sterile ophthalmic fashion.   A 1 millimeter clear-corneal paracentesis was made inferotemporally. Preservative-free 1% lidocaine mixed with 1:1,000 bisulfite-free aqueous solution of epinephrine was injected into the anterior chamber. The anterior chamber was then filled with Viscoat viscoelastic. A 2.4 millimeter keratome was used to make a clear-corneal incision superotemporally. A curvilinear capsulorrhexis was made with a cystotome and capsulorrhexis forceps. Balanced salt solution was used to hydrodissect and hydrodelineate the nucleus. Phacoemulsification was then used to remove the lens nucleus and epinucleus. The remaining cortex was then removed using the irrigation and aspiration handpiece. Provisc was then placed into the capsular bag to distend it for lens placement. A +11.50 D DIB00 intraocular lens was then  injected into the capsular bag. The remaining viscoelastic was aspirated.   Wounds were hydrated with balanced salt solution.  The anterior chamber was inflated to a physiologic pressure with balanced salt solution.  No wound leaks were noted. Vigamox was injected intracamerally.  Timolol and Brimonidine drops were applied to the eye.  The patient was taken to the recovery room in stable condition without complications of anesthesia or surgery.  Maryann Alar [place] 05/23/2022, 8:37 AM

## 2022-06-03 ENCOUNTER — Encounter: Payer: Self-pay | Admitting: Ophthalmology

## 2022-06-04 NOTE — Discharge Instructions (Signed)

## 2022-06-06 ENCOUNTER — Other Ambulatory Visit: Payer: Self-pay

## 2022-06-06 ENCOUNTER — Encounter
Admission: RE | Disposition: A | Discharge status: Home / Self Care | Payer: Self-pay | Source: Ambulatory Visit | Attending: Ophthalmology

## 2022-06-06 ENCOUNTER — Ambulatory Visit: Payer: Medicare Other | Admitting: Anesthesiology

## 2022-06-06 ENCOUNTER — Encounter: Payer: Self-pay | Admitting: Ophthalmology

## 2022-06-06 ENCOUNTER — Ambulatory Visit
Admission: RE | Admit: 2022-06-06 | Discharge: 2022-06-06 | Disposition: A | Discharge status: Home / Self Care | Payer: Medicare Other | Source: Ambulatory Visit | Attending: Ophthalmology | Admitting: Ophthalmology

## 2022-06-06 DIAGNOSIS — E1136 Type 2 diabetes mellitus with diabetic cataract: Secondary | ICD-10-CM | POA: Diagnosis not present

## 2022-06-06 DIAGNOSIS — K219 Gastro-esophageal reflux disease without esophagitis: Secondary | ICD-10-CM | POA: Insufficient documentation

## 2022-06-06 DIAGNOSIS — Z8673 Personal history of transient ischemic attack (TIA), and cerebral infarction without residual deficits: Secondary | ICD-10-CM | POA: Insufficient documentation

## 2022-06-06 DIAGNOSIS — H2511 Age-related nuclear cataract, right eye: Secondary | ICD-10-CM | POA: Diagnosis present

## 2022-06-06 DIAGNOSIS — Z87891 Personal history of nicotine dependence: Secondary | ICD-10-CM | POA: Diagnosis not present

## 2022-06-06 DIAGNOSIS — G4733 Obstructive sleep apnea (adult) (pediatric): Secondary | ICD-10-CM | POA: Insufficient documentation

## 2022-06-06 DIAGNOSIS — I10 Essential (primary) hypertension: Secondary | ICD-10-CM | POA: Insufficient documentation

## 2022-06-06 HISTORY — PX: CATARACT EXTRACTION W/PHACO: SHX586

## 2022-06-06 LAB — GLUCOSE, CAPILLARY: Glucose-Capillary: 98 mg/dL (ref 70–99)

## 2022-06-06 SURGERY — PHACOEMULSIFICATION, CATARACT, WITH IOL INSERTION
Anesthesia: Monitor Anesthesia Care | Site: Eye | Laterality: Right

## 2022-06-06 MED ORDER — SIGHTPATH DOSE#1 NA HYALUR & NA CHOND-NA HYALUR IO KIT
PACK | INTRAOCULAR | Status: DC | PRN
  Administered 2022-06-06: 1 via OPHTHALMIC

## 2022-06-06 MED ORDER — LIDOCAINE HCL (PF) 2 % IJ SOLN
INTRAOCULAR | Status: DC | PRN
  Administered 2022-06-06: 1 mL via INTRAOCULAR

## 2022-06-06 MED ORDER — TETRACAINE HCL 0.5 % OP SOLN
1.0000 [drp] | OPHTHALMIC | Status: DC | PRN
  Administered 2022-06-06 (×3): 1 [drp] via OPHTHALMIC

## 2022-06-06 MED ORDER — MOXIFLOXACIN HCL 0.5 % OP SOLN
OPHTHALMIC | Status: DC | PRN
  Administered 2022-06-06: 0.2 mL via OPHTHALMIC

## 2022-06-06 MED ORDER — LACTATED RINGERS IV SOLN: INTRAVENOUS | Status: DC

## 2022-06-06 MED ORDER — FENTANYL CITRATE (PF) 100 MCG/2ML IJ SOLN
INTRAMUSCULAR | Status: DC | PRN
  Administered 2022-06-06 (×2): 25 ug via INTRAVENOUS

## 2022-06-06 MED ORDER — BRIMONIDINE TARTRATE-TIMOLOL 0.2-0.5 % OP SOLN
OPHTHALMIC | Status: DC | PRN
  Administered 2022-06-06: 1 [drp] via OPHTHALMIC

## 2022-06-06 MED ORDER — MIDAZOLAM HCL 2 MG/2ML IJ SOLN
INTRAMUSCULAR | Status: DC | PRN
  Administered 2022-06-06: 1 mg via INTRAVENOUS

## 2022-06-06 MED ORDER — SIGHTPATH DOSE#1 BSS IO SOLN
INTRAOCULAR | Status: DC | PRN
  Administered 2022-06-06: 15 mL

## 2022-06-06 MED ORDER — SIGHTPATH DOSE#1 BSS IO SOLN
INTRAOCULAR | Status: DC | PRN
  Administered 2022-06-06: 66 mL via OPHTHALMIC

## 2022-06-06 MED ORDER — ARMC OPHTHALMIC DILATING DROPS
1.0000 | OPHTHALMIC | Status: DC | PRN
  Administered 2022-06-06 (×3): 1 via OPHTHALMIC

## 2022-06-06 SURGICAL SUPPLY — 12 items
CATARACT SUITE SIGHTPATH (MISCELLANEOUS) ×1 IMPLANT
DISSECTOR HYDRO NUCLEUS 50X22 (MISCELLANEOUS) ×1 IMPLANT
DRSG TEGADERM 2-3/8X2-3/4 SM (GAUZE/BANDAGES/DRESSINGS) ×1 IMPLANT
FEE CATARACT SUITE SIGHTPATH (MISCELLANEOUS) ×1 IMPLANT
GLOVE SURG SYN 7.5  E (GLOVE) ×1
GLOVE SURG SYN 7.5 E (GLOVE) ×1 IMPLANT
GLOVE SURG SYN 7.5 PF PI (GLOVE) ×1 IMPLANT
GLOVE SURG SYN 8.5  E (GLOVE) ×1
GLOVE SURG SYN 8.5 E (GLOVE) ×1 IMPLANT
GLOVE SURG SYN 8.5 PF PI (GLOVE) ×1 IMPLANT
LENS IOL TECNIS EYHANCE 10.5 (Intraocular Lens) IMPLANT
WATER STERILE IRR 250ML POUR (IV SOLUTION) ×1 IMPLANT

## 2022-06-06 NOTE — Anesthesia Preprocedure Evaluation (Signed)
Anesthesia Evaluation  Patient identified by MRN, date of birth, ID band Patient awake    Reviewed: Allergy & Precautions, NPO status , Patient's Chart, lab work & pertinent test results  History of Anesthesia Complications (+) history of anesthetic complications  Airway Mallampati: III  TM Distance: <3 FB Neck ROM: full    Dental  (+) Chipped, Poor Dentition, Missing   Pulmonary sleep apnea , former smoker,    Pulmonary exam normal        Cardiovascular hypertension, (-) anginaNormal cardiovascular exam     Neuro/Psych Seizures -,  CVA, Residual Symptoms negative psych ROS   GI/Hepatic Neg liver ROS, GERD  Controlled,  Endo/Other  diabetes, Type 2  Renal/GU      Musculoskeletal   Abdominal   Peds  Hematology negative hematology ROS (+)   Anesthesia Other Findings Past Medical History: No date: Diabetes mellitus without complication ([place]) 1610: DVT (deep venous thrombosis) (HCC)     Comment:  left thigh No date: GERD (gastroesophageal reflux disease) No date: High cholesterol No date: Hypertension No date: Left hemiparesis ([place]) No date: Left-sided weakness     Comment:  r/t to stroke 2014 No date: Neuropathy     Comment:  left leg No date: OSA on CPAP No date: Seizures ([place]) 08/12/2012: Stroke [facility])     Comment:  also age 75 and 2018 or 2019 No date: Stroke [facility]) No date: Uses wheelchair     Comment:  Able to stand and pivot No date: Wears dentures     Comment:  full upper and lower  Past Surgical History: 05/23/2022: CATARACT EXTRACTION W/PHACO; Left     Comment:  Procedure: CATARACT EXTRACTION PHACO AND INTRAOCULAR               LENS PLACEMENT ([place]) LEFT;  Surgeon: Norvel Richards, MD;  Location: [place];  Service:               Ophthalmology;  Laterality: Left;  4.12 0:43.5 09/22/2012: TEE WITHOUT CARDIOVERSION; N/A     Comment:  Procedure: TRANSESOPHAGEAL  ECHOCARDIOGRAM (TEE);                Surgeon: Thayer Headings, MD;  Location: [place];                Service: Cardiovascular;  Laterality: N/A; 04/14/2020: TEE WITHOUT CARDIOVERSION; N/A     Comment:  Procedure: TRANSESOPHAGEAL ECHOCARDIOGRAM (TEE);                Surgeon: Minna Merritts, MD;  Location: [facility] ORS;                Service: Cardiovascular;  Laterality: N/A;  BMI    Body Mass Index: 26.66 kg/m      Reproductive/Obstetrics negative OB ROS                             Anesthesia Physical Anesthesia Plan  ASA: 3  Anesthesia Plan: MAC   Post-op Pain Management:    Induction: Intravenous  PONV Risk Score and Plan:   Airway Management Planned: Natural Airway and Nasal Cannula  Additional Equipment:   Intra-op Plan:   Post-operative Plan:   Informed Consent: I have reviewed the patients History and Physical, chart, labs and discussed the procedure including the risks, benefits and alternatives for the proposed anesthesia with  the patient or authorized representative who has indicated his/her understanding and acceptance.     Dental Advisory Given  Plan Discussed with: Anesthesiologist, CRNA and Surgeon  Anesthesia Plan Comments: (Patient consented for risks of anesthesia including but not limited to:  - adverse reactions to medications - damage to eyes, teeth, lips or other oral mucosa - nerve damage due to positioning  - sore throat or hoarseness - Damage to heart, brain, nerves, lungs, other parts of body or loss of life  Patient voiced understanding.)        Anesthesia Quick Evaluation

## 2022-06-06 NOTE — Op Note (Signed)
OPERATIVE NOTE  Zaniyah Wernette [id number] 06/06/2022   PREOPERATIVE DIAGNOSIS: Nuclear sclerotic cataract right eye. H25.11   POSTOPERATIVE DIAGNOSIS: Nuclear sclerotic cataract right eye. H25.11   PROCEDURE:  Phacoemusification with posterior chamber intraocular lens placement of the right eye  Ultrasound time: Procedure(s) with comments: CATARACT EXTRACTION PHACO AND INTRAOCULAR LENS PLACEMENT (IOC) RIGHT 3.67 00:34.8 (Right) - Diabetic  LENS:   Implant Name Type Inv. Item Serial No. Manufacturer Lot No. LRB No. Used Action  LENS IOL TECNIS EYHANCE 10.5 - [id number] Intraocular Lens LENS IOL TECNIS EYHANCE 10.5 [id number] [employer]  Right 1 Implanted      SURGEON:  Courtney Heys. Lazarus Salines, MD   ANESTHESIA:  Topical with tetracaine drops, augmented with 1% preservative-free intracameral lidocaine.   COMPLICATIONS:  None.   DESCRIPTION OF PROCEDURE:  The patient was identified in the holding room and transported to the operating room and placed in the supine position under the operating microscope.  The right eye was identified as the operative eye, which was prepped and draped in the usual sterile ophthalmic fashion.   A 1 millimeter clear-corneal paracentesis was made superotemporally. Preservative-free 1% lidocaine mixed with 1:1,000 bisulfite-free aqueous solution of epinephrine was injected into the anterior chamber. The anterior chamber was then filled with Viscoat viscoelastic. A 2.4 millimeter keratome was used to make a clear-corneal incision inferotemporally. A curvilinear capsulorrhexis was made with a cystotome and capsulorrhexis forceps. Balanced salt solution was used to hydrodissect and hydrodelineate the nucleus. Phacoemulsification was then used to remove the lens nucleus and epinucleus. The remaining cortex was then removed using the irrigation and aspiration handpiece. Provisc was then placed into the capsular bag to distend it for lens placement. A +10.50 D DIB00 intraocular  lens was then injected into the capsular bag. The remaining viscoelastic was aspirated.   Wounds were hydrated with balanced salt solution.  The anterior chamber was inflated to a physiologic pressure with balanced salt solution.  No wound leaks were noted. Vigamox was injected intracamerally.  Timolol and Brimonidine drops were applied to the eye.  The patient was taken to the recovery room in stable condition without complications of anesthesia or surgery.  Maryann Alar [place] 06/06/2022, 2:09 PM

## 2022-06-06 NOTE — Anesthesia Postprocedure Evaluation (Signed)
Anesthesia Post Note  Patient: Stephanie Mason  Procedure(s) Performed: CATARACT EXTRACTION PHACO AND INTRAOCULAR LENS PLACEMENT (IOC) RIGHT 3.67 00:34.8 (Right: Eye)  Patient location during evaluation: Phase II Anesthesia Type: MAC Level of consciousness: awake and alert Pain management: pain level controlled Vital Signs Assessment: post-procedure vital signs reviewed and stable Respiratory status: spontaneous breathing, nonlabored ventilation, respiratory function stable and patient connected to nasal cannula oxygen Cardiovascular status: stable and blood pressure returned to baseline Postop Assessment: no apparent nausea or vomiting Anesthetic complications: no   There were no known notable events for this encounter.   Last Vitals:  Vitals:   06/06/22 1409 06/06/22 1414  BP: 95/73 94/66  Pulse: 63 61  Resp: 17 17  Temp: (!) 36.1 C (!) 36.1 C  SpO2: 95% 94%    Last Pain:  Vitals:   06/06/22 1414  TempSrc:   PainSc: 0-No pain                 Precious Haws Lonell Stamos

## 2022-06-06 NOTE — Transfer of Care (Signed)
Immediate Anesthesia Transfer of Care Note  Patient: Stephanie Mason  Procedure(s) Performed: CATARACT EXTRACTION PHACO AND INTRAOCULAR LENS PLACEMENT (IOC) RIGHT 3.67 00:34.8 (Right: Eye)  Patient Location: PACU  Anesthesia Type: MAC  Level of Consciousness: awake, alert  and patient cooperative  Airway and Oxygen Therapy: Patient Spontanous Breathing and Patient connected to supplemental oxygen  Post-op Assessment: Post-op Vital signs reviewed, Patient's Cardiovascular Status Stable, Respiratory Function Stable, Patent Airway and No signs of Nausea or vomiting  Post-op Vital Signs: Reviewed and stable  Complications: There were no known notable events for this encounter.

## 2022-06-06 NOTE — H&P (Signed)
[facility]   Primary Care Physician:  [facility] Ophthalmologist: Dr. Deberah Pelton  Pre-Procedure History & Physical: HPI:  Stephanie Mason is a 71 y.o. female here for cataract surgery.   Past Medical History:  Diagnosis Date   Diabetes mellitus without complication (HCC)    DVT (deep venous thrombosis) (HCC) 2018   left thigh   GERD (gastroesophageal reflux disease)    High cholesterol    Hypertension    Left hemiparesis (HCC)    Left-sided weakness    r/t to stroke 2014   Neuropathy    left leg   OSA on CPAP    Seizures (HCC)    Stroke (HCC) 08/12/2012   also age 84 and 2018 or 2019   Stroke [facility])    Uses wheelchair    Able to stand and pivot   Wears dentures    full upper and lower    Past Surgical History:  Procedure Laterality Date   CATARACT EXTRACTION W/PHACO Left 05/23/2022   Procedure: CATARACT EXTRACTION PHACO AND INTRAOCULAR LENS PLACEMENT (IOC) LEFT;  Surgeon: Estanislado Pandy, MD;  Location: [facility];  Service: Ophthalmology;  Laterality: Left;  4.12 0:43.5   TEE WITHOUT CARDIOVERSION N/A 09/22/2012   Procedure: TRANSESOPHAGEAL ECHOCARDIOGRAM (TEE);  Surgeon: [name], MD;  Location: [facility] END[facility];  Service: Cardiovascular;  Laterality: N/A;   TEE WITHOUT CARDIOVERSION N/A 04/14/2020   Procedure: TRANSESOPHAGEAL ECHOCARDIOGRAM (TEE);  Surgeon: Antonieta Iba, MD;  Location: [facility] ORS;  Service: Cardiovascular;  Laterality: N/A;    Prior to Admission medications   Medication Sig Start Date End Date Taking? Authorizing Provider  alum & mag hydroxide-simeth (MAALOX/MYLANTA) 200-200-20 MG/5ML suspension Take by mouth 3 (three) times daily as needed for indigestion or heartburn.   Yes [provider]  amLODipine (NORVASC) 5 MG tablet Take 5 mg by mouth at bedtime.   Yes [provider]  ASPERCREME LIDOCAINE EX Apply topically 4 (four) times daily as needed.   Yes [provider]   ASPIRIN 81 PO Take by mouth daily.   Yes [provider]  calcium carbonate (TUMS - DOSED IN MG ELEMENTAL CALCIUM) 500 MG chewable tablet Chew 1 tablet by mouth 3 (three) times daily as needed for indigestion or heartburn.   Yes [provider]  camphor-menthol Wynelle Fanny) lotion Apply 1 Application topically as needed for itching.   Yes [provider]  carboxymethylcellul-glycerin (OPTIVE) 0.5-0.9 % ophthalmic solution Place 1 drop into both eyes 4 (four) times daily as needed for dry eyes.   Yes [provider]  cholecalciferol (VITAMIN D3) 25 MCG (1000 UNIT) tablet Take 1,000 Units by mouth daily.   Yes [provider]  clopidogrel (PLAVIX) 75 MG tablet Take 1 tablet (75 mg total) by mouth daily. 04/20/20  Yes Sheikh, Omair Latif, DO  cyclobenzaprine (FLEXERIL) 5 MG tablet Take 1 tablet (5 mg total) by mouth 3 (three) times daily. 04/20/20  Yes Sheikh, Omair Latif, DO  escitalopram (LEXAPRO) 5 MG tablet Take 5 mg by mouth daily.   Yes [provider]  gabapentin (NEURONTIN) 400 MG capsule Take 400 mg by mouth at bedtime.   Yes [provider]  losartan (COZAAR) 50 MG tablet Take 50 mg by mouth daily. Take every other day.   Yes [provider]  metFORMIN (GLUCOPHAGE-XR) 500 MG 24 hr tablet Take 500 mg by mouth daily with breakfast.   Yes [provider]  metoprolol succinate (TOPROL-XL) 25 MG 24 hr  tablet Take 12.5 mg by mouth at bedtime.   Yes [provider]  mometasone (NASONEX) 50 MCG/ACT nasal spray Place 2 sprays into the nose daily as needed.   Yes [provider]  pantoprazole (PROTONIX) 20 MG tablet Take 20 mg by mouth daily.   Yes [provider]  polyethylene glycol (MIRALAX / GLYCOLAX) 17 g packet Take 17 g by mouth daily as needed.   Yes [provider]  senna-docusate (SENOKOT-S) 8.6-50 MG tablet Take 2 tablets by mouth 2 (two) times daily. 04/20/20  Yes Sheikh, Omair Latif,  DO  tizanidine (ZANAFLEX) 2 MG capsule Take 1 capsule (2 mg total) by mouth 3 (three) times daily. 12/28/12  Yes Kirsteins, Luanna Salk, MD  traMADol (ULTRAM) 50 MG tablet Take 1 tablet (50 mg total) by mouth every 6 (six) hours as needed for pain. 02/18/13  Yes Kirsteins, Luanna Salk, MD  Zinc Oxide (DESITIN) 13 % CREA Apply topically as needed.   Yes [provider]  acetaminophen (TYLENOL) 325 MG tablet Take 325 mg by mouth daily as needed for pain. For pain    [provider]  acetaminophen (TYLENOL) 650 MG CR tablet Take 1,300 mg by mouth every 8 (eight) hours as needed for pain.    [provider]  bisacodyl (DULCOLAX) 10 MG suppository Place 1 suppository (10 mg total) rectally daily as needed for severe constipation. 04/20/20   Sheikh, Omair Latif, DO  fluticasone (FLONASE) 50 MCG/ACT nasal spray Place 1 spray into the nose daily as needed. For nasal congestion    [provider]  metoCLOPramide (REGLAN) 10 MG tablet Take 1 tablet (10 mg total) by mouth every 8 (eight) hours as needed for up to 5 days for nausea (headache). 04/09/20 04/14/20  Arta Silence, MD  oxybutynin (DITROPAN-XL) 5 MG 24 hr tablet Take 5 mg by mouth daily.    [provider]    Allergies as of 02/14/2022   (No Known Allergies)    Family History  Problem Relation Age of Onset   Stroke Mother    Cancer Father        esophageal   Coronary artery disease Other    Heart failure Brother    Breast cancer Maternal Aunt    Breast cancer Cousin     Social History   Socioeconomic History   Marital status: Single    Spouse name: Not on file   Number of children: 2   Years of education: 12   Highest education level: Not on file  Occupational History   Occupation: Forensic psychologist: [employer]  Tobacco Use   Smoking status: Former    Types: Cigarettes    Quit date: 2018    Years since quitting: 5.8   Smokeless tobacco: Current    Types: Snuff  Vaping  Use   Vaping Use: Never used  Substance and Sexual Activity   Alcohol use: Never    Comment: h/o alcohol. stopped using in 2014   Drug use: Never   Sexual activity: Never  Other Topics Concern   Not on file  Social History Narrative   ** Merged History Encounter **       Patient is single and lives with her significant other Scarlett Presto).   Patient has two children.   Patient is disabled.   Patient has a high school education.   Patient is right-handed.   Patient drinks one cup of coffee most days, and tea occasionally.  Social Determinants of Health   Financial Resource Strain: Not on file  Food Insecurity: Not on file  Transportation Needs: Not on file  Physical Activity: Not on file  Stress: Not on file  Social Connections: Not on file  Intimate Partner Violence: Not on file    Review of Systems: See HPI, otherwise negative ROS  Physical Exam: Ht 5\' 7"  (1.702 m)   Wt 77.6 kg   BMI 26.78 kg/m  General:   Alert, cooperative in NAD Head:  Normocephalic and atraumatic. Respiratory:  Normal work of breathing. Cardiovascular:  RRR  Impression/Plan: Stephanie Mason is here for cataract surgery.  Risks, benefits, limitations, and alternatives regarding cataract surgery have been reviewed with the patient.  Questions have been answered.  All parties agreeable.   Norvel Richards, MD  06/06/2022, 11:22 AM

## 2022-06-07 ENCOUNTER — Encounter: Payer: Self-pay | Admitting: Ophthalmology

## 2022-07-21 IMAGING — MG MM DIGITAL SCREENING BILAT W/ TOMO AND CAD
8 series · 8 of 24 positions shown · non-contrast
Comparison: Previous exam(s).

CLINICAL DATA: Screening.

EXAM:
DIGITAL SCREENING BILATERAL MAMMOGRAM WITH TOMOSYNTHESIS AND CAD
TECHNIQUE: Bilateral screening digital craniocaudal and mediolateral oblique
mammograms were obtained. Bilateral screening digital breast
tomosynthesis was performed. The images were evaluated with
computer-aided detection.

[L MLO synth-2D]
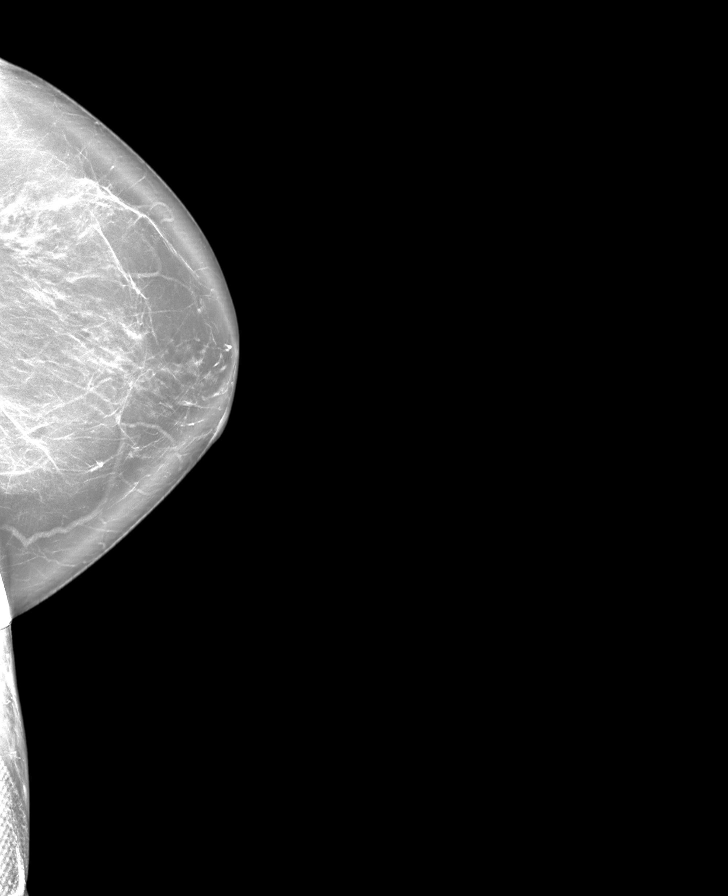

[R CC synth-2D]
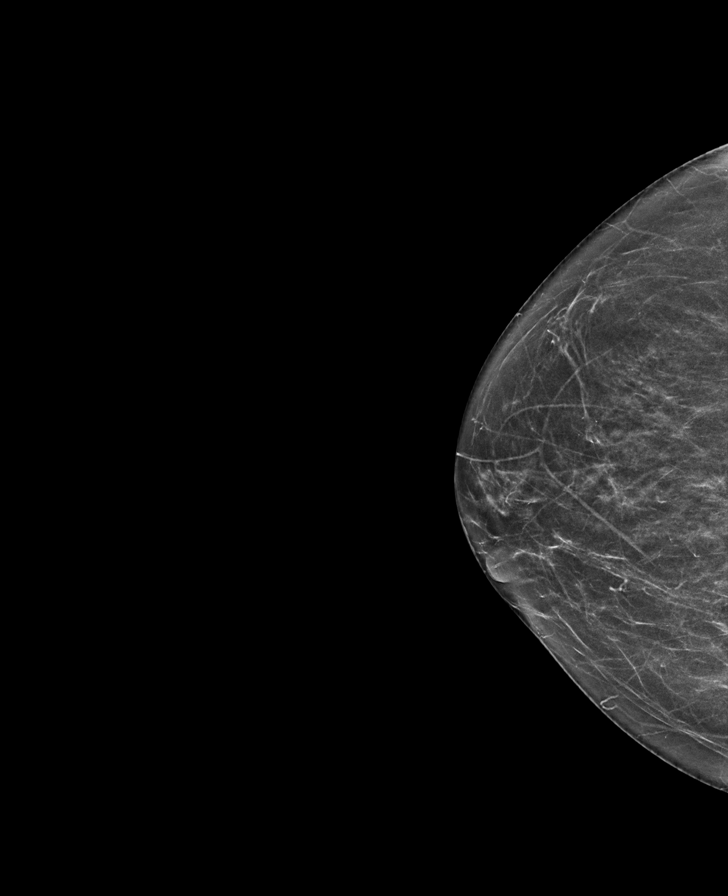

[R MLO synth-2D]
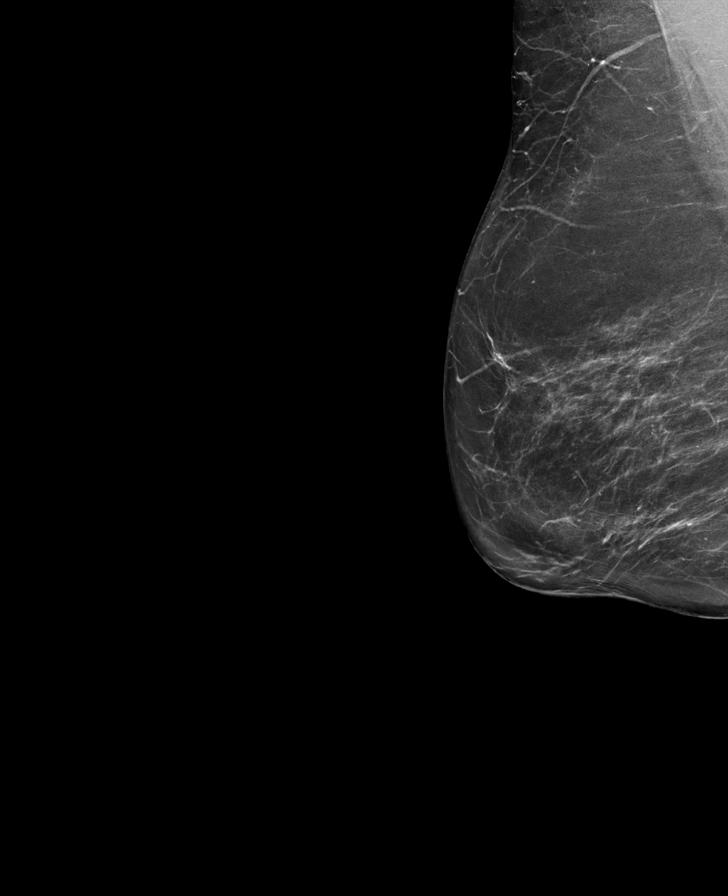

[L CC synth-2D]
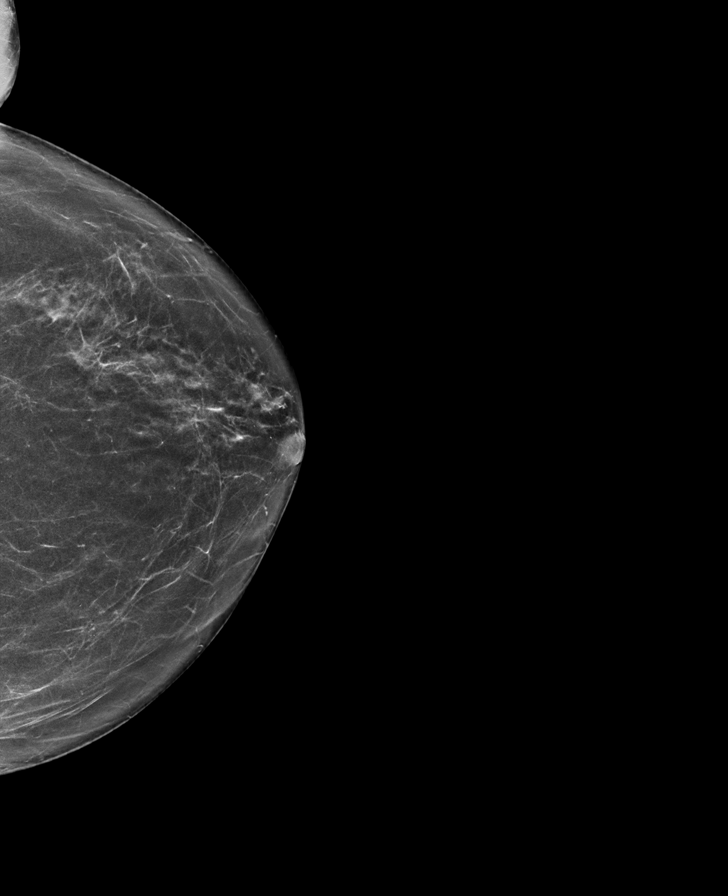

[L CC tomo · tomo slice 35/68.0]
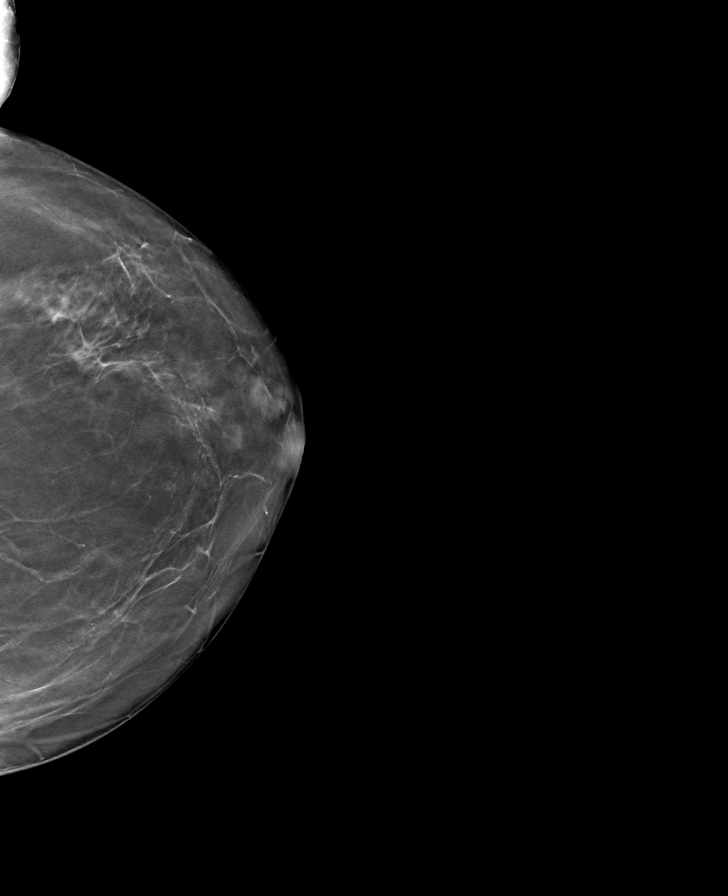

[R MLO tomo · tomo slice 35/70.0]
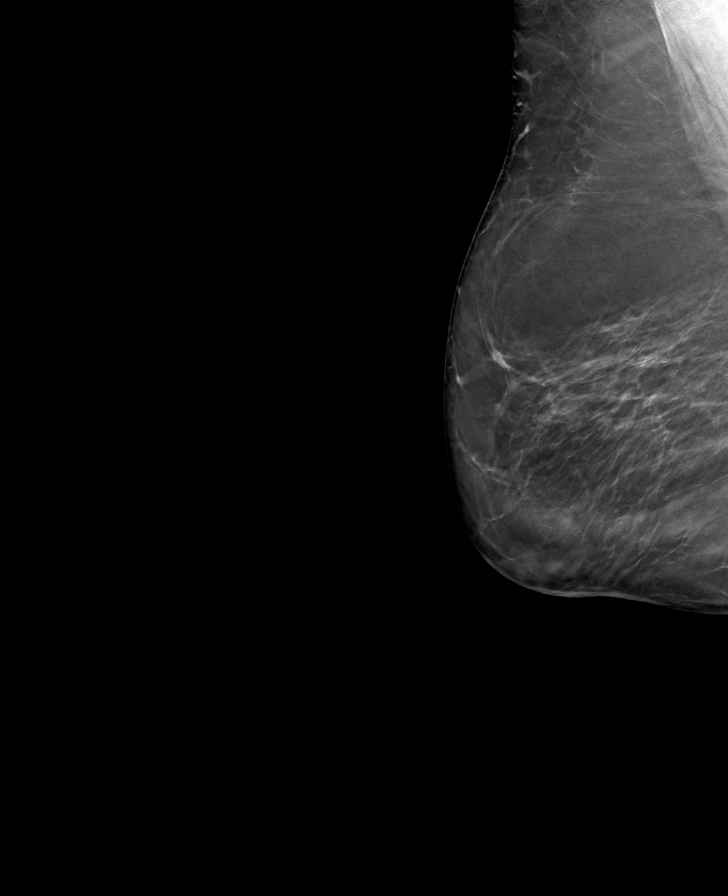

[L MLO tomo · tomo slice 47/94.0]
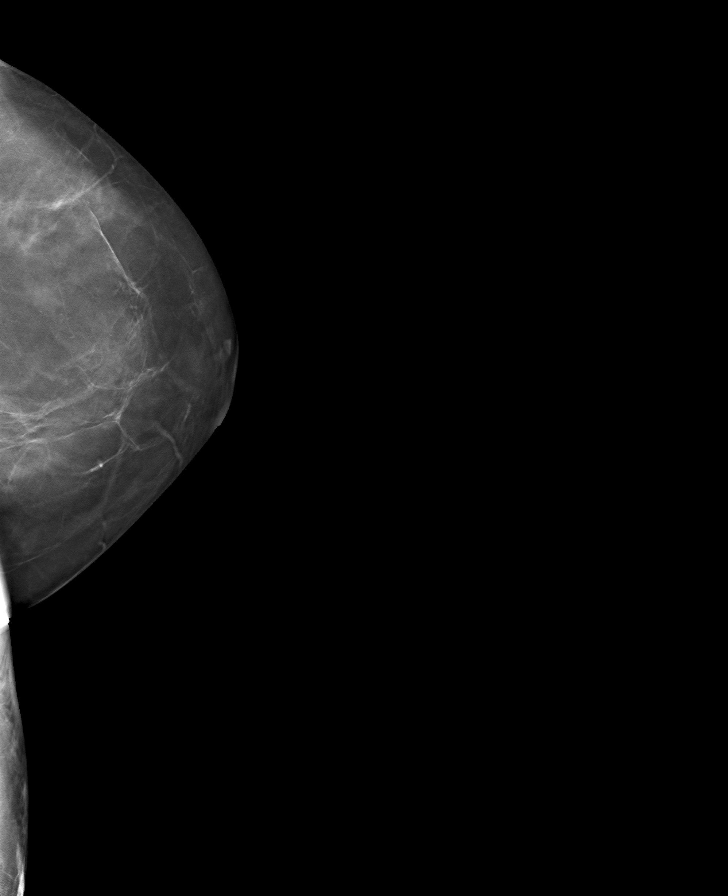

[R CC tomo · tomo slice 30/59.0]
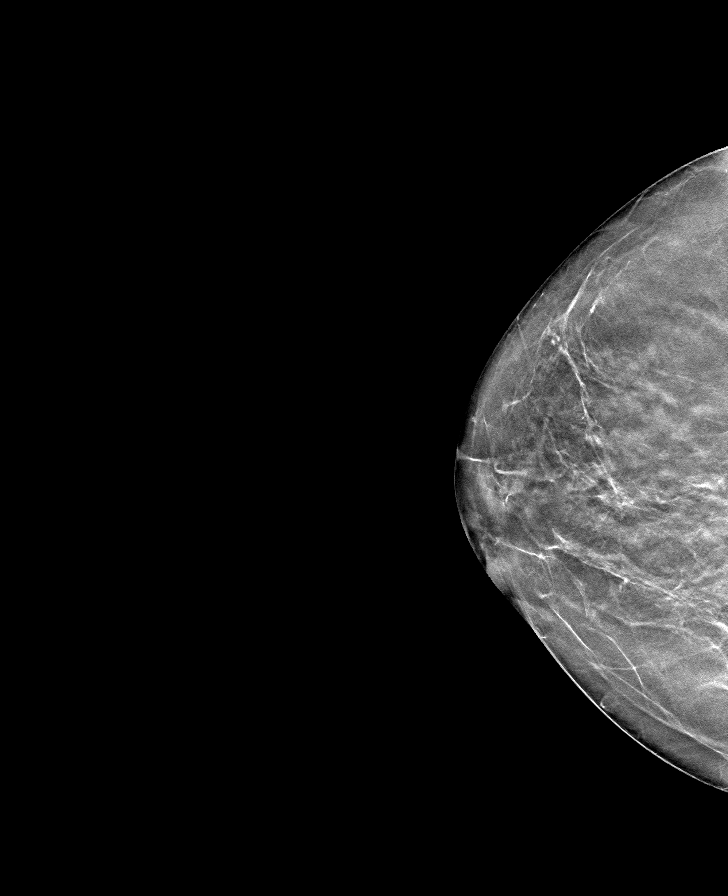

[8 of 24 positions shown; findings below may reference images not displayed]

ACR Breast Density Category b: There are scattered areas of
fibroglandular density.
FINDINGS: Imaging of the posterior breast tissue is limited due to patient
mobility. Given this limitation, there are no findings suspicious
for malignancy.
IMPRESSION: No mammographic evidence of malignancy. A result letter of this
screening mammogram will be mailed directly to the patient.

RECOMMENDATION:
Screening mammogram in one year. (Code:8Q-J-RDE)

BI-RADS CATEGORY  1: Negative.

## 2024-04-21 ENCOUNTER — Other Ambulatory Visit: Payer: Self-pay | Admitting: Internal Medicine

## 2024-04-21 DIAGNOSIS — Z780 Asymptomatic menopausal state: Secondary | ICD-10-CM

## 2024-04-21 DIAGNOSIS — Z1231 Encounter for screening mammogram for malignant neoplasm of breast: Secondary | ICD-10-CM
